# Patient Record
Sex: Female | Born: 1985 | Race: White | Hispanic: No | Marital: Single | Smoking: Former smoker
Health system: Southern US, Community
[De-identification: ages and names within clinical notes are randomized; demographics above are authoritative.]

## PROBLEM LIST (undated history)

## (undated) DIAGNOSIS — F1910 Other psychoactive substance abuse, uncomplicated: Secondary | ICD-10-CM

## (undated) DIAGNOSIS — Z87442 Personal history of urinary calculi: Secondary | ICD-10-CM

## (undated) DIAGNOSIS — G4730 Sleep apnea, unspecified: Secondary | ICD-10-CM

## (undated) DIAGNOSIS — R87619 Unspecified abnormal cytological findings in specimens from cervix uteri: Secondary | ICD-10-CM

## (undated) DIAGNOSIS — G43909 Migraine, unspecified, not intractable, without status migrainosus: Secondary | ICD-10-CM

## (undated) DIAGNOSIS — M1990 Unspecified osteoarthritis, unspecified site: Secondary | ICD-10-CM

## (undated) DIAGNOSIS — K219 Gastro-esophageal reflux disease without esophagitis: Secondary | ICD-10-CM

## (undated) DIAGNOSIS — N83209 Unspecified ovarian cyst, unspecified side: Secondary | ICD-10-CM

## (undated) DIAGNOSIS — E282 Polycystic ovarian syndrome: Secondary | ICD-10-CM

## (undated) DIAGNOSIS — N200 Calculus of kidney: Secondary | ICD-10-CM

## (undated) DIAGNOSIS — F419 Anxiety disorder, unspecified: Secondary | ICD-10-CM

## (undated) DIAGNOSIS — A389 Scarlet fever, uncomplicated: Secondary | ICD-10-CM

## (undated) DIAGNOSIS — H269 Unspecified cataract: Secondary | ICD-10-CM

## (undated) DIAGNOSIS — F32A Depression, unspecified: Secondary | ICD-10-CM

## (undated) DIAGNOSIS — F4310 Post-traumatic stress disorder, unspecified: Secondary | ICD-10-CM

## (undated) HISTORY — DX: Sleep apnea, unspecified: G47.30

## (undated) HISTORY — DX: Scarlet fever, uncomplicated: A38.9

## (undated) HISTORY — DX: Unspecified cataract: H26.9

## (undated) HISTORY — DX: Other psychoactive substance abuse, uncomplicated: F19.10

## (undated) HISTORY — DX: Unspecified abnormal cytological findings in specimens from cervix uteri: R87.619

## (undated) HISTORY — PX: TONSILLECTOMY: SUR1361

## (undated) HISTORY — PX: LUMBAR LAMINECTOMY: SHX95

## (undated) HISTORY — DX: Gastro-esophageal reflux disease without esophagitis: K21.9

## (undated) HISTORY — DX: Unspecified ovarian cyst, unspecified side: N83.209

## (undated) HISTORY — PX: OTHER SURGICAL HISTORY: SHX169

---
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
## 2005-08-08 ENCOUNTER — Emergency Department (HOSPITAL_COMMUNITY): Admission: EM | Admit: 2005-08-08 | Discharge: 2005-08-09 | Payer: Self-pay | Admitting: Emergency Medicine

## 2005-08-09 ENCOUNTER — Emergency Department (HOSPITAL_COMMUNITY): Admission: EM | Admit: 2005-08-09 | Discharge: 2005-08-09 | Payer: Self-pay | Admitting: Emergency Medicine

## 2005-08-15 ENCOUNTER — Emergency Department (HOSPITAL_COMMUNITY): Admission: EM | Admit: 2005-08-15 | Discharge: 2005-08-16 | Payer: Self-pay | Admitting: Emergency Medicine

## 2012-07-22 DIAGNOSIS — M545 Low back pain, unspecified: Secondary | ICD-10-CM

## 2012-07-22 HISTORY — DX: Low back pain, unspecified: M54.50

## 2012-12-24 DIAGNOSIS — F419 Anxiety disorder, unspecified: Secondary | ICD-10-CM | POA: Insufficient documentation

## 2012-12-24 DIAGNOSIS — J449 Chronic obstructive pulmonary disease, unspecified: Secondary | ICD-10-CM | POA: Insufficient documentation

## 2012-12-29 DIAGNOSIS — F603 Borderline personality disorder: Secondary | ICD-10-CM | POA: Insufficient documentation

## 2015-04-20 DIAGNOSIS — M4310 Spondylolisthesis, site unspecified: Secondary | ICD-10-CM | POA: Insufficient documentation

## 2016-06-04 DIAGNOSIS — R102 Pelvic and perineal pain: Secondary | ICD-10-CM | POA: Insufficient documentation

## 2016-06-04 HISTORY — DX: Pelvic and perineal pain: R10.2

## 2016-07-05 DIAGNOSIS — E282 Polycystic ovarian syndrome: Secondary | ICD-10-CM | POA: Insufficient documentation

## 2018-02-27 DIAGNOSIS — F332 Major depressive disorder, recurrent severe without psychotic features: Secondary | ICD-10-CM | POA: Insufficient documentation

## 2018-03-09 DIAGNOSIS — F39 Unspecified mood [affective] disorder: Secondary | ICD-10-CM | POA: Insufficient documentation

## 2018-03-09 DIAGNOSIS — F32A Depression, unspecified: Secondary | ICD-10-CM | POA: Insufficient documentation

## 2018-03-09 DIAGNOSIS — F4310 Post-traumatic stress disorder, unspecified: Secondary | ICD-10-CM | POA: Insufficient documentation

## 2018-03-09 HISTORY — DX: Depression, unspecified: F32.A

## 2018-03-10 DIAGNOSIS — N3000 Acute cystitis without hematuria: Secondary | ICD-10-CM | POA: Insufficient documentation

## 2018-03-10 DIAGNOSIS — K219 Gastro-esophageal reflux disease without esophagitis: Secondary | ICD-10-CM | POA: Insufficient documentation

## 2018-03-10 HISTORY — DX: Acute cystitis without hematuria: N30.00

## 2018-03-28 DIAGNOSIS — R079 Chest pain, unspecified: Secondary | ICD-10-CM

## 2018-03-28 DIAGNOSIS — G4733 Obstructive sleep apnea (adult) (pediatric): Secondary | ICD-10-CM | POA: Insufficient documentation

## 2018-03-28 DIAGNOSIS — E669 Obesity, unspecified: Secondary | ICD-10-CM | POA: Insufficient documentation

## 2018-03-28 HISTORY — DX: Chest pain, unspecified: R07.9

## 2018-04-12 DIAGNOSIS — R45851 Suicidal ideations: Secondary | ICD-10-CM | POA: Insufficient documentation

## 2018-04-12 HISTORY — DX: Suicidal ideations: R45.851

## 2018-06-12 DIAGNOSIS — B370 Candidal stomatitis: Secondary | ICD-10-CM | POA: Insufficient documentation

## 2018-06-12 DIAGNOSIS — A0472 Enterocolitis due to Clostridium difficile, not specified as recurrent: Secondary | ICD-10-CM

## 2018-06-12 HISTORY — DX: Candidal stomatitis: B37.0

## 2018-06-12 HISTORY — DX: Enterocolitis due to Clostridium difficile, not specified as recurrent: A04.72

## 2018-06-13 DIAGNOSIS — R197 Diarrhea, unspecified: Secondary | ICD-10-CM | POA: Insufficient documentation

## 2018-06-13 DIAGNOSIS — R7401 Elevation of levels of liver transaminase levels: Secondary | ICD-10-CM | POA: Insufficient documentation

## 2018-06-13 HISTORY — DX: Diarrhea, unspecified: R19.7

## 2018-06-13 HISTORY — DX: Hypomagnesemia: E83.42

## 2019-12-25 DIAGNOSIS — R4588 Nonsuicidal self-harm: Secondary | ICD-10-CM

## 2019-12-25 HISTORY — DX: Nonsuicidal self-harm: R45.88

## 2020-01-23 DIAGNOSIS — Z7289 Other problems related to lifestyle: Secondary | ICD-10-CM | POA: Insufficient documentation

## 2020-02-09 ENCOUNTER — Other Ambulatory Visit: Payer: Self-pay

## 2020-02-09 ENCOUNTER — Encounter (HOSPITAL_COMMUNITY): Payer: Self-pay | Admitting: *Deleted

## 2020-02-09 DIAGNOSIS — Z87891 Personal history of nicotine dependence: Secondary | ICD-10-CM | POA: Insufficient documentation

## 2020-02-09 DIAGNOSIS — N83202 Unspecified ovarian cyst, left side: Secondary | ICD-10-CM | POA: Diagnosis not present

## 2020-02-09 DIAGNOSIS — R1032 Left lower quadrant pain: Secondary | ICD-10-CM | POA: Insufficient documentation

## 2020-02-09 DIAGNOSIS — R109 Unspecified abdominal pain: Secondary | ICD-10-CM | POA: Diagnosis present

## 2020-02-09 LAB — URINALYSIS, ROUTINE W REFLEX MICROSCOPIC
Bilirubin Urine: NEGATIVE
Glucose, UA: NEGATIVE mg/dL
Ketones, ur: NEGATIVE mg/dL
Nitrite: NEGATIVE
Protein, ur: NEGATIVE mg/dL
RBC / HPF: >50 RBC/hpf — ABNORMAL HIGH (ref 0–5)
Specific Gravity, Urine: 1.023 (ref 1.005–1.030)
pH: 5 (ref 5.0–8.0)

## 2020-02-09 LAB — COMPREHENSIVE METABOLIC PANEL
ALT: 22 U/L (ref 0–44)
AST: 20 U/L (ref 15–41)
Albumin: 4.1 g/dL (ref 3.5–5.0)
Alkaline Phosphatase: 74 U/L (ref 38–126)
Anion gap: 9 (ref 5–15)
BUN: 23 mg/dL — ABNORMAL HIGH (ref 6–20)
CO2: 25 mmol/L (ref 22–32)
Calcium: 8.9 mg/dL (ref 8.9–10.3)
Chloride: 106 mmol/L (ref 98–111)
Creatinine, Ser: 0.93 mg/dL (ref 0.44–1.00)
GFR calc Af Amer: >60 mL/min (ref >60)
GFR calc non Af Amer: >60 mL/min (ref >60)
Glucose, Bld: 101 mg/dL — ABNORMAL HIGH (ref 70–99)
Potassium: 3.6 mmol/L (ref 3.5–5.1)
Sodium: 140 mmol/L (ref 135–145)
Total Bilirubin: 0.2 mg/dL — ABNORMAL LOW (ref 0.3–1.2)
Total Protein: 7.1 g/dL (ref 6.5–8.1)

## 2020-02-09 LAB — CBC
HCT: 42.3 % (ref 36.0–46.0)
Hemoglobin: 14.1 g/dL (ref 12.0–15.0)
MCH: 31.6 pg (ref 26.0–34.0)
MCHC: 33.3 g/dL (ref 30.0–36.0)
MCV: 94.8 fL (ref 80.0–100.0)
Platelets: 247 10*3/uL (ref 150–400)
RBC: 4.46 MIL/uL (ref 3.87–5.11)
RDW: 12 % (ref 11.5–15.5)
WBC: 10.6 10*3/uL — ABNORMAL HIGH (ref 4.0–10.5)
nRBC: 0 % (ref 0.0–0.2)

## 2020-02-09 LAB — I-STAT BETA HCG BLOOD, ED (MC, WL, AP ONLY): I-stat hCG, quantitative: <5 m[IU]/mL (ref <5)

## 2020-02-09 LAB — LIPASE, BLOOD: Lipase: 63 U/L — ABNORMAL HIGH (ref 11–51)

## 2020-02-09 MED ORDER — SODIUM CHLORIDE 0.9% FLUSH: 3.0000 mL | Freq: Once | INTRAVENOUS | Status: DC

## 2020-02-09 NOTE — ED Triage Notes (Signed)
Pt arrives via EMS, Left lower abdominal pain, for 8 hours, associated with nausea. En route 136 palpated, hr 80, 16 rr, 98 cbg . 98% RA

## 2020-02-09 NOTE — ED Triage Notes (Signed)
Pt reporting left lower abdominal pain "pressure and pain", pain better after urinating temporarily. No burning with urination, currently on her period. Nausea, now resolved. No vomiting, fevers,  or diarrhea.

## 2020-02-10 ENCOUNTER — Emergency Department (HOSPITAL_COMMUNITY): Payer: Medicare Other

## 2020-02-10 ENCOUNTER — Emergency Department (HOSPITAL_COMMUNITY)
Admission: EM | Admit: 2020-02-10 | Discharge: 2020-02-10 | Disposition: A | Discharge status: Home / Self Care | Payer: Medicare Other | Attending: Emergency Medicine | Admitting: Emergency Medicine

## 2020-02-10 ENCOUNTER — Telehealth: Payer: Self-pay | Admitting: Obstetrics & Gynecology

## 2020-02-10 DIAGNOSIS — N83202 Unspecified ovarian cyst, left side: Secondary | ICD-10-CM

## 2020-02-10 HISTORY — DX: Migraine, unspecified, not intractable, without status migrainosus: G43.909

## 2020-02-10 HISTORY — DX: Unspecified osteoarthritis, unspecified site: M19.90

## 2020-02-10 HISTORY — DX: Polycystic ovarian syndrome: E28.2

## 2020-02-10 LAB — CBG MONITORING, ED: Glucose-Capillary: 76 mg/dL (ref 70–99)

## 2020-02-10 MED ORDER — ACETAMINOPHEN 325 MG PO TABS
650.0000 mg | ORAL_TABLET | Freq: Once | ORAL | Status: AC
  Administered 2020-02-10: 07:00:00 650 mg via ORAL
  Filled 2020-02-10: qty 2

## 2020-02-10 NOTE — Discharge Instructions (Signed)
You have ovarian cyst. We think that is the cause.  Please see the Gynecologist as requested. Return to the ER if the symptoms get worse.

## 2020-02-10 NOTE — ED Provider Notes (Signed)
  Physical Exam  BP (!) 160/92   Pulse (!) 104   Temp 97.8 F (36.6 C)   Resp 18   Ht 5\' 5"  (1.651 m)   Wt 121.6 kg   SpO2 100%   BMI 44.60 kg/m   Physical Exam  ED Course/Procedures     Procedures  MDM   Assuming care of patient from Dr. .   Patient in the ED for abdominal pain. Workup thus far shows no peritoneal findings.  No red flags suggesting PID, TOA.  Concerning findings are as following : None Important pending results are : Ultrasound pelvis.  According to Dr. Bebe Shaggy, plan is to follow-up on the ultrasound.  Patient had no complains, no concerns from the nursing side. Will continue to monitor.  9:19 AM Ultrasound reveals that patient is a 5 cm ovarian cyst.  On repeat assessment patient is complaining of 2 out of 10 pain at most.  Her pain is not intense like it was earlier.  She has had ovarian cyst before, but has not required any surgical intervention.  Results of the ED work-up discussed with her.  She is new to town and does not have a gynecologist.  Follow-up appointment information provided.  Strict ER return precautions discussed.  At this time patient is not having complications from her cyst and she is stable for discharge.        Bebe Shaggy, MD 02/10/20 [phone]

## 2020-02-10 NOTE — ED Notes (Signed)
Discharge paperwork reviewed with pt.  Pt with no questions or concerns at this time, ambulatory at time of discharge.  

## 2020-02-10 NOTE — Telephone Encounter (Signed)
Patient was seen in the [facility] ED for pain. She would like to know what she can take for pain until her appointment.

## 2020-02-10 NOTE — ED Provider Notes (Signed)
[facility]-EMERGENCY DEPT Provider Note   CSN: [id number] Arrival date & time: 02/09/20  2230     History Chief Complaint  Patient presents with  . Abdominal Pain    Rebekah Davis is a 34 y.o. female.  The history is provided by the patient.  Abdominal Pain Pain location:  LLQ Pain quality: pressure   Pain radiates to:  Does not radiate Pain severity:  Moderate Onset quality:  Sudden Timing:  Constant Progression:  Improving Chronicity:  New Relieved by:  None tried Worsened by:  Nothing Associated symptoms: nausea   Associated symptoms: no constipation, no diarrhea, no dysuria, no fever, no hematemesis, no hematochezia, no vaginal discharge and no vomiting   Risk factors: has not had multiple surgeries   Patient reports onset left lower quadrant abdominal pain.  She reports a feeling of pressure, that was worsened with urination.  No back pain.  No fevers or vomiting.  She does report previous history of kidney stones, never had any urologic intervention.  Reports she is on her menstrual cycle but is improving.  Also reports history of ovarian cyst`     Past Medical History:  Diagnosis Date  . Arthritis   . Kidney stones   . Migraine   . PCOS (polycystic ovarian syndrome)        OB History   No obstetric history on file.     No family history on file.  Social History   Tobacco Use  . Smoking status: Former Smoker  Substance Use Topics  . Alcohol use: Not Currently  . Drug use: Not Currently    Home Medications Prior to Admission medications   Not on File    Allergies    Patient has no allergy information on record.  Review of Systems   Review of Systems  Constitutional: Negative for fever.  Gastrointestinal: Positive for abdominal pain and nausea. Negative for constipation, diarrhea, hematemesis, hematochezia and vomiting.  Genitourinary: Negative for dysuria and vaginal discharge.       Patient on menstrual cycle at this  time  All other systems reviewed and are negative.   Physical Exam Updated Vital Signs BP (!) 160/92   Pulse (!) 104   Temp 97.8 F (36.6 C)   Resp 18   Ht 1.651 m (5\' 5" )   Wt 121.6 kg   SpO2 100%   BMI 44.60 kg/m   Physical Exam CONSTITUTIONAL: Well developed/well nourished HEAD: Normocephalic/atraumatic EYES: EOMI/PERRL, no icterus ENMT: Mucous membranes moist NECK: supple no meningeal signs SPINE/BACK:entire spine nontender CV: S1/S2 noted, no murmurs/rubs/gallops noted LUNGS: Lungs are clear to auscultation bilaterally, no apparent distress ABDOMEN: soft, mild LLQ tenderness, no rebound or guarding, bowel sounds noted throughout abdomen GU:no cva tenderness NEURO: Pt is awake/alert/appropriate, moves all extremitiesx4.  No facial droop.  EXTREMITIES: pulses normal/equal, full ROM SKIN: warm, color normal PSYCH: no abnormalities of mood noted, alert and oriented to situation  ED Results / Procedures / Treatments   Labs (all labs ordered are listed, but only abnormal results are displayed) Labs Reviewed  LIPASE, BLOOD - Abnormal; Notable for the following components:      Result Value   Lipase 63 (*)    All other components within normal limits  COMPREHENSIVE METABOLIC PANEL - Abnormal; Notable for the following components:   Glucose, Bld 101 (*)    BUN 23 (*)    Total Bilirubin 0.2 (*)    All other components within normal limits  CBC - Abnormal; Notable  for the following components:   WBC 10.6 (*)    All other components within normal limits  URINALYSIS, ROUTINE W REFLEX MICROSCOPIC - Abnormal; Notable for the following components:   Hgb urine dipstick MODERATE (*)    Leukocytes,Ua SMALL (*)    RBC / HPF >50 (*)    Bacteria, UA RARE (*)    All other components within normal limits  URINE CULTURE  I-STAT BETA HCG BLOOD, ED ([facility], [facility], [facility] ONLY)  CBG MONITORING, ED    EKG None  Radiology No results found.  Procedures Procedures  Medications Ordered  in ED Medications  sodium chloride flush (NS) 0.9 % injection 3 mL (has no administration in time range)  acetaminophen (TYLENOL) tablet 650 mg (650 mg Oral Given 02/10/20 3662)    ED Course  I have reviewed the triage vital signs and the nursing notes.  Pertinent labs  results that were available during my care of the patient were reviewed by me and considered in my medical decision making (see chart for details).    MDM Rules/Calculators/A&P                     7:24 AM Presents for left lower abdominal pain.  She reports she has had kidney stones before multiple CT scans.  She declines CT imaging.  She is overall feeling improved.  Renal ultrasound has been ordered. Signed out to dr Kathrynn Humble at shift change to f/u on US imaging  Final Clinical Impression(s) / ED Diagnoses Final diagnoses:  None    Rx / DC Orders ED Discharge Orders    None       Ripley Fraise, MD 02/10/20 [phone]

## 2020-02-10 NOTE — Telephone Encounter (Signed)
Left message that I am returning your call in regards to your question and our office closing until Monday the only thing we can recommend is Tylenol or Ibuprofen because you would need to be evaluated.   If pain gets worse please go to Urgent Care.    Addison Naegeli, RN

## 2020-02-10 NOTE — ED Notes (Signed)
Pt provided [place] sandwich and apple juice, per her request.  EDP aware.

## 2020-02-11 LAB — URINE CULTURE

## 2020-02-19 DIAGNOSIS — N83209 Unspecified ovarian cyst, unspecified side: Secondary | ICD-10-CM | POA: Insufficient documentation

## 2020-03-11 ENCOUNTER — Ambulatory Visit (INDEPENDENT_AMBULATORY_CARE_PROVIDER_SITE_OTHER): Payer: Medicare Other | Admitting: Nurse Practitioner

## 2020-03-11 ENCOUNTER — Other Ambulatory Visit: Payer: Self-pay

## 2020-03-11 ENCOUNTER — Encounter: Payer: Self-pay | Admitting: Nurse Practitioner

## 2020-03-11 VITALS — BP 130/64 | HR 92 | Temp 98.4°F | Ht 65.5 in | Wt 289.0 lb

## 2020-03-11 DIAGNOSIS — N83202 Unspecified ovarian cyst, left side: Secondary | ICD-10-CM

## 2020-03-11 DIAGNOSIS — F332 Major depressive disorder, recurrent severe without psychotic features: Secondary | ICD-10-CM | POA: Diagnosis not present

## 2020-03-11 DIAGNOSIS — M7662 Achilles tendinitis, left leg: Secondary | ICD-10-CM

## 2020-03-11 DIAGNOSIS — Z7689 Persons encountering health services in other specified circumstances: Secondary | ICD-10-CM

## 2020-03-11 DIAGNOSIS — M7660 Achilles tendinitis, unspecified leg: Secondary | ICD-10-CM | POA: Insufficient documentation

## 2020-03-11 DIAGNOSIS — Z0000 Encounter for general adult medical examination without abnormal findings: Secondary | ICD-10-CM

## 2020-03-11 DIAGNOSIS — E559 Vitamin D deficiency, unspecified: Secondary | ICD-10-CM

## 2020-03-11 LAB — POCT URINALYSIS DIP (CLINITEK)
Bilirubin, UA: NEGATIVE
Glucose, UA: NEGATIVE mg/dL
Ketones, POC UA: NEGATIVE mg/dL
Leukocytes, UA: NEGATIVE
Nitrite, UA: NEGATIVE
POC PROTEIN,UA: NEGATIVE
Spec Grav, UA: 1.025 (ref 1.010–1.025)
Urobilinogen, UA: 0.2 E.U./dL
pH, UA: 6.5 (ref 5.0–8.0)

## 2020-03-11 MED ORDER — ESCITALOPRAM OXALATE 10 MG PO TABS: 10.0000 mg | ORAL_TABLET | Freq: Every day | ORAL | 2 refills | Status: DC

## 2020-03-11 MED ORDER — ARIPIPRAZOLE 20 MG PO TABS: 20.0000 mg | ORAL_TABLET | Freq: Every day | ORAL | 2 refills | Status: DC

## 2020-03-11 MED ORDER — ESCITALOPRAM OXALATE 5 MG PO TABS: 5.0000 mg | ORAL_TABLET | Freq: Every day | ORAL | 2 refills | Status: DC

## 2020-03-11 MED ORDER — MELOXICAM 15 MG PO TABS: 15.0000 mg | ORAL_TABLET | Freq: Every day | ORAL | 2 refills | Status: DC

## 2020-03-11 NOTE — Progress Notes (Signed)
[facility] Patient [facility] [address] [place], [place]  [address] Phone:  [phone]   Fax:  [phone]   New Patient Office Visit  Subjective:  Patient ID: Rebekah Davis, female    DOB: June 28, 1986  Age: 34 y.o. MRN: [id number]  CC:  Chief Complaint  Patient presents with  . New Patient (Initial Visit)    Ovarian cyst    HPI Rebekah Davis presents to establish.  She is accompanied by a grandmother.  She  has a past medical history of Arthritis, Kidney stones, Migraine, and PCOS (polycystic ovarian syndrome).   She has a history of major depression and borderline personality disorder.  She has had some suicidal ideations in the past.  She is currently undergoing counseling however does not have a prescriber for her current antidepressant therapy.  She admits that her current treatment of Lexapro 15 mg along with the Abilify 20 mg is effective.  She denies current depressed mood.  Denies any suicidal or homicidal ideations.  She will need refills.  She is in rehab at the [facility].  She moved to [facility] approximately 6 weeks ago.  Prior to this she was rehab out [place]; [place].  She was going to do a dance and stepped in hole in October 2020. She reinjured her foot 2 weeks ago. She has burning and little pain. She admits that her pain is worse in the morning.  The pain is aggravated by standing for long periods it causes more pain and burning. She denies any numbness, tingling or weakness.  She has been seen by orthopedist.  Previous MRI indicated left Achilles tear.  She is SP MRI from [facility] on yesterday.  She will start PT in on week.  She has recently been evaluated by gynecology and was diagnosed with ovarian cyst.  She has a follow-up apt with a new gynecologist for further evaluation of ovarian cyst.  Past Medical History:  Diagnosis Date  . Arthritis   . Kidney stones   . Migraine   . PCOS (polycystic ovarian syndrome)     History reviewed. No pertinent  surgical history.  Family History  Problem Relation Age of Onset  . Hypertension Mother   . Diabetes Mother   . Kidney disease Mother   . Depression Mother   . Hypertension Father   . Diabetes Father     Social History   Socioeconomic History  . Marital status: Single    Spouse name: Not on file  . Number of children: Not on file  . Years of education: Not on file  . Highest education level: Not on file  Occupational History  . Not on file  Tobacco Use  . Smoking status: Former Games developer  . Smokeless tobacco: Never Used  Substance and Sexual Activity  . Alcohol use: Not Currently  . Drug use: Not Currently  . Sexual activity: Not on file  Other Topics Concern  . Not on file  Social History Narrative  . Not on file   Social Determinants of Health   Financial Resource Strain:   . Difficulty of Paying Living Expenses:   Food Insecurity:   . Worried About Programme researcher, broadcasting/film/video in the Last Year:   . Barista in the Last Year:   Transportation Needs:   . Freight forwarder (Medical):   Marland Kitchen Lack of Transportation (Non-Medical):   Physical Activity:   . Days of Exercise per Week:   . Minutes of Exercise  per Session:   Stress:   . Feeling of Stress :   Social Connections:   . Frequency of Communication with Friends and Family:   . Frequency of Social Gatherings with Friends and Family:   . Attends Religious Services:   . Active Member of Clubs or Organizations:   . Attends Banker Meetings:   Marland Kitchen Marital Status:   Intimate Partner Violence:   . Fear of Current or Ex-Partner:   . Emotionally Abused:   Marland Kitchen Physically Abused:   . Sexually Abused:     ROS Review of Systems  Objective:   Today's Vitals: BP 130/64   Pulse 92   Temp 98.4 F (36.9 C)   Ht 5' 5.5" (1.664 m)   Wt 289 lb (131.1 kg)   LMP 03/09/2020   SpO2 100%   BMI 47.36 kg/m   Physical Exam Constitutional:      General: She is not in acute distress.    Appearance: She is  obese. She is not ill-appearing, toxic-appearing or diaphoretic.  HENT:     Head: Normocephalic and atraumatic.     Nose: Nose normal.     Mouth/Throat:     Mouth: Mucous membranes are moist.     Pharynx: Oropharynx is clear.  Eyes:     Extraocular Movements: Extraocular movements intact.     Conjunctiva/sclera: Conjunctivae normal.     Pupils: Pupils are equal, round, and reactive to light.  Cardiovascular:     Rate and Rhythm: Normal rate and regular rhythm.     Pulses: Normal pulses.     Heart sounds: Normal heart sounds.  Pulmonary:     Effort: Pulmonary effort is normal.     Breath sounds: Normal breath sounds.  Abdominal:     General: Bowel sounds are normal.     Palpations: Abdomen is soft.     Tenderness: There is no abdominal tenderness.     Comments: Left lower quadrant  Musculoskeletal:        General: No swelling or tenderness.     Cervical back: Normal range of motion and neck supple.     Left lower leg: No edema.     Comments: Crutches and use Adequate range of motion Minimal guarding to left lower extremity  Skin:    General: Skin is warm and dry.  Neurological:     General: No focal deficit present.     Mental Status: She is alert and oriented to person, place, and time.  Psychiatric:        Mood and Affect: Mood normal.        Behavior: Behavior normal.        Thought Content: Thought content normal.        Judgment: Judgment normal.     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 Metabolic Panel (12) (Completed)   TSH (Completed)   Magnesium  (Completed)   Sedimentation rate (Completed)   Amylase (Completed)   Lipase (Completed)  POCT URINALYSIS DIP (CLINITEK) (Completed)   Vitamin D insufficiency       Labs pending   Relevant Orders   VITAMIN D 25 Hydroxy (Vit-D Deficiency, Fractures) (Completed)   Encounter to establish care          Outpatient Encounter Medications as of 03/11/2020  Medication Sig  . acetaminophen (TYLENOL) 325 MG tablet Take 650 mg by mouth daily as needed for pain.  . ARIPiprazole (ABILIFY) 20 MG tablet Take 1 tablet (20 mg total) by mouth daily.  Marland Kitchen escitalopram (LEXAPRO) 10 MG tablet Take 1 tablet (10 mg total) by mouth daily.  Marland Kitchen escitalopram (LEXAPRO) 5 MG tablet Take 1 tablet (5 mg total) by mouth daily.  . meloxicam (MOBIC) 15 MG tablet Take 1 tablet (15 mg total) by mouth daily.  . [DISCONTINUED] ARIPiprazole (ABILIFY) 20 MG tablet Take 20 mg by mouth daily.  . [DISCONTINUED] escitalopram (LEXAPRO) 10 MG tablet Take 10 mg by mouth daily.  . [DISCONTINUED] escitalopram (LEXAPRO) 5 MG tablet Take 5 mg by mouth daily.  . [DISCONTINUED] meloxicam (MOBIC) 15 MG tablet Take 15 mg by mouth daily.   No facility-administered encounter medications on file as of 03/11/2020.    Follow-up: Return in about 3 months (around 06/11/2020).   Vevelyn Francois, NP

## 2020-03-11 NOTE — Patient Instructions (Signed)
Ovarian Cyst An ovarian cyst is a fluid-filled sac on an ovary. The ovaries are organs that make eggs in women. Most ovarian cysts go away on their own and are not cancerous (are benign). Some cysts need treatment. Follow these instructions at home:  Take over-the-counter and prescription medicines only as told by your doctor.  Do not drive or use heavy machinery while taking prescription pain medicine.  Get pelvic exams and Pap tests as often as told by your doctor.  Return to your normal activities as told by your doctor. Ask your doctor what activities are safe for you.  Do not use any products that contain nicotine or tobacco, such as cigarettes and e-cigarettes. If you need help quitting, ask your doctor.  Keep all follow-up visits as told by your doctor. This is important. Contact a doctor if:  Your periods are: ? Late. ? Irregular. ? Painful.   Your periods stop.  You have pelvic pain that does not go away.  You have pressure on your bladder.  You have trouble making your bladder empty when you pee (urinate).  You have pain during sex.  You have any of the following in your belly (abdomen): ? A feeling of fullness. ? Pressure. ? Discomfort. ? Pain that does not go away. ? Swelling.  You feel sick most of the time.  You have trouble pooping (have constipation).  You are not as hungry as usual (you lose your appetite).  You get very bad acne.  You start to have more hair on your body and face.  You are gaining weight or losing weight without changing your exercise and eating habits.  You think you may be pregnant. Get help right away if:  You have belly pain that is very bad or gets worse.  You cannot eat or drink without throwing up (vomiting).  You suddenly get a fever.  Your period is a lot heavier than usual. This information is not intended to replace advice given to you by your health care provider. Make sure you discuss any questions you have  with your health care provider. Document Revised: 09/13/2017 Document Reviewed: 03/04/2016 Elsevier Patient Education  [employer]. Preventing Unhealthy [employer], Adult Staying at a healthy weight is important to your overall health. When fat builds up in your body, you may become overweight or obese. Being overweight or obese increases your risk of developing certain health problems, such as heart disease, diabetes, sleeping problems, joint problems, and some types of cancer. Unhealthy weight gain is often the result of making unhealthy food choices or not getting enough exercise. You can make changes to your lifestyle to prevent obesity and stay as healthy as possible. What nutrition changes can be made?   Eat only as much as your body needs. To do this: ? Pay attention to signs that you are hungry or full. Stop eating as soon as you feel full. ? If you feel hungry, try drinking water first before eating. Drink enough water so your urine is clear or pale yellow. ? Eat smaller portions. Pay attention to portion sizes when eating out. ? Look at serving sizes on food labels. Most foods contain more than one serving per container. ? Eat the recommended number of calories for your gender and activity level. For most active people, a daily total of 2,000 calories is appropriate. If you are trying to lose weight or are not very active, you may need to eat fewer calories. Talk with your health  care provider or a diet and nutrition specialist (dietitian) about how many calories you need each day.  Choose healthy foods, such as: ? Fruits and vegetables. At each meal, try to fill at least half of your plate with fruits and vegetables. ? Whole grains, such as whole-wheat bread, brown rice, and quinoa. ? Lean meats, such as chicken or fish. ? Other healthy proteins, such as beans, eggs, or tofu. ? Healthy fats, such as nuts, seeds, fatty fish, and olive oil. ? Low-fat or fat-free dairy  products.  Check food labels, and avoid food and drinks that: ? Are high in calories. ? Have added sugar. ? Are high in sodium. ? Have saturated fats or trans fats.  Cook foods in healthier ways, such as by baking, broiling, or grilling.  Make a meal plan for the week, and shop with a grocery list to help you stay on track with your purchases. Try to avoid going to the grocery store when you are hungry.  When grocery shopping, try to shop around the outside of the store first, where the fresh foods are. Doing this helps you to avoid prepackaged foods, which can be high in sugar, salt (sodium), and fat. What lifestyle changes can be made?   Exercise for 30 or more minutes on 5 or more days each week. Exercising may include brisk walking, yard work, biking, running, swimming, and team sports like basketball and soccer. Ask your health care provider which exercises are safe for you.  Do muscle-strengthening activities, such as lifting weights or using resistance bands, on 2 or more days a week.  Do not use any products that contain nicotine or tobacco, such as cigarettes and e-cigarettes. If you need help quitting, ask your health care provider.  Limit alcohol intake to no more than 1 drink a day for nonpregnant women and 2 drinks a day for men. One drink equals 12 oz of beer, 5 oz of wine, or 1 oz of hard liquor.  Try to get 7-9 hours of sleep each night. What other changes can be made?  Keep a food and activity journal to keep track of: ? What you ate and how many calories you had. Remember to count the calories in sauces, dressings, and side dishes. ? Whether you were active, and what exercises you did. ? Your calorie, weight, and activity goals.  Check your weight regularly. Track any changes. If you notice you have gained weight, make changes to your diet or activity routine.  Avoid taking weight-loss medicines or supplements. Talk to your health care provider before starting any  new medicine or supplement.  Talk to your health care provider before trying any new diet or exercise plan. Why are these changes important? Eating healthy, staying active, and having healthy habits can help you to prevent obesity. Those changes also:  Help you manage stress and emotions.  Help you connect with friends and family.  Improve your self-esteem.  Improve your sleep.  Prevent long-term health problems. What can happen if changes are not made? Being obese or overweight can cause you to develop joint or bone problems, which can make it hard for you to stay active or do activities you enjoy. Being obese or overweight also puts stress on your heart and lungs and can lead to health problems like diabetes, heart disease, and some cancers. Where to find more information Talk with your health care provider or a dietitian about healthy eating and healthy lifestyle choices. You may also find  information from:  U.S. Department of Agriculture, MyPlate: https://ball-collins.biz/  American Heart Association: www.heart.org  Centers for Disease Control and Prevention: FootballExhibition.com.br Summary  Staying at a healthy weight is important to your overall health. It helps you to prevent certain diseases and health problems, such as heart disease, diabetes, joint problems, sleep disorders, and some types of cancer.  Being obese or overweight can cause you to develop joint or bone problems, which can make it hard for you to stay active or do activities you enjoy.  You can prevent unhealthy weight gain by eating a healthy diet, exercising regularly, not smoking, limiting alcohol, and getting enough sleep.  Talk with your health care provider or a dietitian for guidance about healthy eating and healthy lifestyle choices. This information is not intended to replace advice given to you by your health care provider. Make sure you discuss any questions you have with your health care provider. Document  Revised: 10/04/2017 Document Reviewed: 11/07/2016 Elsevier Patient Education  2020 Elsevier Inc. Achilles Tendinitis  Achilles tendinitis is inflammation of the tough, cord-like band that attaches the lower leg muscles to the heel bone (Achilles tendon). This is usually caused by overusing the tendon and the ankle joint. Achilles tendinitis usually gets better over time with treatment and caring for yourself at home. It can take weeks or months to heal completely. What are the causes? This condition may be caused by:  A sudden increase in exercise or activity, such as running.  Doing the same exercises or activities, such as jumping, over and over.  Not warming up calf muscles before exercising.  Exercising in shoes that are worn out or not made for exercise.  Having arthritis or a bone growth (spur) on the back of the heel bone. This can rub against the tendon and hurt it.  Age-related wear and tear. Tendons become less flexible with age and are more likely to be injured. What are the signs or symptoms? Common symptoms of this condition include:  Pain in the Achilles tendon or in the back of the leg, just above the heel. The pain usually gets worse with exercise.  Stiffness or soreness in the back of the leg, especially in the morning.  Swelling of the skin over the Achilles tendon.  Thickening of the tendon.  Trouble standing on tiptoe. How is this diagnosed? This condition is diagnosed based on your symptoms and a physical exam. You may have tests, including:  X-rays.  MRI. How is this treated? The goal of treatment is to relieve symptoms and help your injury heal. Treatment may include:  Decreasing or stopping activities that caused the tendinitis. This may mean switching to low-impact exercises like biking or swimming.  Icing the injured area.  Doing physical therapy, including strengthening and stretching exercises.  Taking NSAIDs, such as ibuprofen, to help  relieve pain and swelling.  Using supportive shoes, wraps, heel lifts, or a walking boot (air cast).  Having surgery. This may be done if your symptoms do not improve after other treatments.  Using high-energy shock wave impulses to stimulate the healing process (extracorporeal shock wave therapy). This is rare.  Having an injection of medicines that help relieve inflammation (corticosteroids). This is rare. Follow these instructions at home: If you have an air cast:  Wear the air cast as told by your health care provider. Remove it only as told by your health care provider.  Loosen it if your toes tingle, become numb, or turn cold and blue.  Keep  it clean.  If the air cast is not waterproof: ? Do not let it get wet. ? Cover it with a watertight covering when you take a bath or shower. Managing pain, stiffness, and swelling   If directed, put ice on the injured area. To do this: ? If you have a removable air cast, remove it as told by your health care provider. ? Put ice in a plastic bag. ? Place a towel between your skin and the bag. ? Leave the ice on for 20 minutes, 2-3 times a day.  Move your toes often to reduce stiffness and swelling.  Raise (elevate) your foot above the level of your heart while you are sitting or lying down. Activity  Gradually return to your normal activities as told by your health care provider. Ask your health care provider what activities are safe for you.  Do not do activities that cause pain.  Consider doing low-impact exercises, like cycling or swimming.  Ask your health care provider when it is safe to drive if you have an air cast on your foot.  If physical therapy was prescribed, do exercises as told by your health care provider or physical therapist. General instructions  If directed, wrap your foot with an elastic bandage or other wrap. This can help to keep your tendon from moving too much while it heals. Your health care provider  will show you how to wrap your foot correctly.  Wear supportive shoes or heel lifts only as told by your health care provider.  Take over-the-counter and prescription medicines only as told by your health care provider.  Keep all follow-up visits as told by your health care provider. This is important. Contact a health care provider if you:  Have symptoms that get worse.  Have pain that does not get better with medicine.  Develop new, unexplained symptoms.  Develop warmth and swelling in your foot.  Have a fever. Get help right away if you:  Have a sudden popping sound or sensation in your Achilles tendon followed by severe pain.  Cannot move your toes or foot.  Cannot put any weight on your foot.  Your foot or toes become numb and look white or blue even after loosening your bandage or air cast. Summary  Achilles tendinitis is inflammation of the tough, cord-like band that attaches the lower leg muscles to the heel bone (Achilles tendon).  This condition is usually caused by overusing the tendon and the ankle joint. It can also be caused by arthritis or normal aging.  The most common symptoms of this condition include pain, swelling, or stiffness in the Achilles tendon or in the back of the leg.  This condition is usually treated by decreasing or stopping activities that caused the tendinitis, icing the injured area, taking NSAIDs, and doing physical therapy. This information is not intended to replace advice given to you by your health care provider. Make sure you discuss any questions you have with your health care provider. Document Revised: 02/16/2019 Document Reviewed: 02/16/2019 Elsevier Patient Education  2020 [employer].

## 2020-03-12 LAB — COMP. METABOLIC PANEL (12)
AST: 24 IU/L (ref 0–40)
Albumin/Globulin Ratio: 2 (ref 1.2–2.2)
Albumin: 4.3 g/dL (ref 3.8–4.8)
Alkaline Phosphatase: 77 IU/L (ref 48–121)
BUN/Creatinine Ratio: 20 (ref 9–23)
BUN: 18 mg/dL (ref 6–20)
Bilirubin Total: 0.3 mg/dL (ref 0.0–1.2)
Calcium: 9.3 mg/dL (ref 8.7–10.2)
Chloride: 104 mmol/L (ref 96–106)
Creatinine, Ser: 0.89 mg/dL (ref 0.57–1.00)
GFR calc Af Amer: 98 mL/min/{1.73_m2} (ref >59)
GFR calc non Af Amer: 85 mL/min/{1.73_m2} (ref >59)
Globulin, Total: 2.1 g/dL (ref 1.5–4.5)
Glucose: 99 mg/dL (ref 65–99)
Potassium: 3.7 mmol/L (ref 3.5–5.2)
Sodium: 142 mmol/L (ref 134–144)
Total Protein: 6.4 g/dL (ref 6.0–8.5)

## 2020-03-12 LAB — LIPASE: Lipase: 23 U/L (ref 14–72)

## 2020-03-12 LAB — TSH: TSH: 1.38 u[IU]/mL (ref 0.450–4.500)

## 2020-03-12 LAB — AMYLASE: Amylase: 47 U/L (ref 31–110)

## 2020-03-12 LAB — VITAMIN D 25 HYDROXY (VIT D DEFICIENCY, FRACTURES): Vit D, 25-Hydroxy: 21.6 ng/mL — ABNORMAL LOW (ref 30.0–100.0)

## 2020-03-12 LAB — SEDIMENTATION RATE: Sed Rate: 7 mm/hr (ref 0–32)

## 2020-03-12 LAB — MAGNESIUM: Magnesium: 1.9 mg/dL (ref 1.6–2.3)

## 2020-03-16 ENCOUNTER — Telehealth: Payer: Self-pay | Admitting: Nurse Practitioner

## 2020-03-16 NOTE — Progress Notes (Signed)
Called  spoke with patient  about lab results, inform patient start vit d

## 2020-03-22 ENCOUNTER — Encounter: Payer: Medicare Other | Admitting: Obstetrics & Gynecology

## 2020-03-23 ENCOUNTER — Other Ambulatory Visit: Payer: Self-pay

## 2020-03-23 ENCOUNTER — Ambulatory Visit: Payer: Medicare Other | Attending: Student | Admitting: Physical Therapy

## 2020-03-23 ENCOUNTER — Encounter: Payer: Self-pay | Admitting: Physical Therapy

## 2020-03-23 DIAGNOSIS — M79672 Pain in left foot: Secondary | ICD-10-CM | POA: Diagnosis not present

## 2020-03-23 DIAGNOSIS — R262 Difficulty in walking, not elsewhere classified: Secondary | ICD-10-CM | POA: Insufficient documentation

## 2020-03-23 NOTE — Patient Instructions (Signed)
Access Code: [email or web address]: [email or web address]: 06/09/2021Prepared by: [facility]  Seated Ankle Circles - 3-4 x daily - 7 x weekly - 10 reps  Ankle Circles in Elevation - 3-4 x daily - 7 x weekly - 10 reps  Long Sitting Calf Stretch with Strap - 5 x daily - 7 x weekly - 1 reps - 10 seconds hold   [facility] [address] [place], [place] [address] Phone # [phone] Fax [phone]

## 2020-03-23 NOTE — Therapy (Signed)
[facility] [address] [place], [place], [address] Phone: [phone]   Fax:  [phone]  Physical Therapy Evaluation  Patient Details  Name: Rebekah Davis MRN: [id number] Date of Birth: 1986-04-30 Referring Provider (PT): Lowella Dell, MD   Encounter Date: 03/23/2020  PT End of Session - 03/23/20 1225    Visit Number  1    Date for PT Re-Evaluation  05/27/20    Authorization Type  Medicare    Authorization Time Period  03/23/20 to 05/27/20    Authorization - Visit Number  1    Authorization - Number of Visits  10    PT Start Time  1015    PT Stop Time  1104    PT Time Calculation (min)  49 min    Activity Tolerance  No increased pain;Patient tolerated treatment well    Behavior During Therapy  [facility] for tasks assessed/performed       Past Medical History:  Diagnosis Date  . Arthritis   . Kidney stones   . Migraine   . PCOS (polycystic ovarian syndrome)     History reviewed. No pertinent surgical history.  There were no vitals filed for this visit.   Subjective Assessment - 03/23/20 1016    Subjective  Pt states that she started having pain in the Lt heel in June of 2020. She was diagnosed with a tear in the Lt achilles in November but as of 2 weeks ago she got a MRI and was told her tear was healed with just tendinitis and bursitis.    Limitations  Walking;Standing    Currently in Pain?  Yes    Pain Score  5     Pain Location  Heel    Pain Orientation  Left;Posterior    Pain Descriptors / Indicators  Aching;Burning    Pain Type  Chronic pain    Pain Radiating Towards  none    Pain Onset  More than a month ago    Pain Frequency  Constant    Aggravating Factors   standing, walking    Pain Relieving Factors  resting, icing    Effect of Pain on Daily Activities  limited participation in activity with her program         [facility] PT Assessment - 03/23/20 0001      Assessment   Medical Diagnosis   Lt achilled tendonitis     Referring Provider (PT)  Lowella Dell, MD    Onset Date/Surgical Date  --   June 2020   Next MD Visit  following PT    Prior Therapy  Had OPPT 3 months but this worsened pain       Precautions   Precautions  None      Prior Function   Vocation Requirements  volunteers 4-5 hours a day, alot of stairs and standing       Cognition   Overall Cognitive Status  Within Functional Limits for tasks assessed      Observation/Other Assessments   Observations  swelling noted Lt lateral heel     Focus on Therapeutic Outcomes (FOTO)   next visit      ROM / Strength   AROM / PROM / Strength  AROM;Strength      AROM   AROM Assessment Site  Ankle    Right/Left Ankle  Left    Left Ankle Dorsiflexion  -10   knee flexed: to neutral    Left Ankle Plantar Flexion  30  Left Ankle Inversion  30    Left Ankle Eversion  5      Strength   Strength Assessment Site  Ankle    Right/Left Ankle  Left    Left Ankle Dorsiflexion  5/5   within range    Left Ankle Plantar Flexion  --   unable to complete standing heel raise   Left Ankle Inversion  4/5    Left Ankle Eversion  5/5      Palpation   Palpation comment  tender to palpation achilles insertion at Lt calcaneus      Ambulation/Gait   Gait Comments  minimal push off on Lt, decreased stance time on Lt: improved standing tolerance with addition of small heel lift       High Level Balance   High Level Balance Comments  unable to maintain single leg balance on Lt                   Objective measurements completed on examination: See above findings.      [facility] PT Treatment/Exercise - 03/23/20 0001      Exercises   Exercises  Ankle      Manual Therapy   Manual Therapy  Soft tissue mobilization    Soft tissue mobilization  STM Lt gastroc, trigger point release with passive ankle DF       Ankle Exercises: Stretches   Gastroc Stretch  2 reps;10 seconds    Gastroc Stretch Limitations   with towel, gentle      Ankle Exercises: Seated   Ankle Circles/Pumps  Left;10 reps             PT Education - 03/23/20 1054    Education Details  adjustments in gati/heel lift while volunteering; implemented HEP    Person(s) Educated  Patient    Methods  Explanation;Handout    Comprehension  Verbalized understanding;Returned demonstration       PT Short Term Goals - 03/23/20 1144      PT SHORT TERM GOAL #1   Title  Pt will be independent with her initial HEP to increase ankle flexibility and decrease pain.    Time  4    Period  Weeks    Status  New        PT Long Term Goals - 03/23/20 1207      PT LONG TERM GOAL #1   Title  Pt will have atleast 10 deg of passive Lt ankle dorsiflexion to improve foot clearance during ambulation.    Time  8    Period  Weeks    Status  New      PT LONG TERM GOAL #2   Title  Pt will be able to ambulate with adequate push off and step length x100 secondary to improved pain and gastroc strength.    Time  8    Period  Weeks    Status  New      PT LONG TERM GOAL #3   Title  Pt will be able to complete atleast 10 reps of single leg heel raise with or without pain to reflect improvements in gastroc strength.    Time  8    Period  Weeks    Status  New      PT LONG TERM GOAL #4   Title  Pt will be able to maintain single leg stance on the Lt for atleast 5 seconds, 2/3 trials, to reflect increase in ankle proprioception.    Time  8    Period  Weeks    Status  New      PT LONG TERM GOAL #5   Title  Pt will report atleast 60% improvement in heel pain and swelling from the start of PT.    Time  8    Period  Weeks    Status  New             Plan - 03/23/20 1227    Clinical Impression Statement  Pt is a pleasant 34 y.o F referred to OPPT with complaints of Lt heel pain since June 2020. She had a course of OPPT but this seemed to exacerbate her symptoms. She was diagnosed with Lt achilles tear which has reportedly healed, and  she now presents with diagnosis of achilles tendinitis and bursitis. Pt has significant tenderness and some swelling along the insertion of the achilles tendon. She has antalgic gait pattern, lacks 10 deg of passive Lt dorsiflexion to neutral and is unable to complete single leg stance on the Lt due to pain. Pt has palpable trigger points in the Lt gastroc and was educated on dry needling. She would like to hold off on this for now, so PT completed soft tissue mobilization to the gastroc. Ended with the game-ready to decrease pain and swelling. Pt would benefit from skilled PT to address her pain and limitations in ankle flexibility, strength and proprioception to increase her ability to participate in daily activity with her peers.    Personal Factors and Comorbidities  Time since onset of injury/illness/exacerbation;Fitness    Examination-Activity Limitations  Stairs;Stand    Manufacturing engineer;Yard Work    Stability/Clinical Decision Making  Stable/Uncomplicated    Clinical Decision Making  Moderate    Rehab Potential  Good    PT Frequency  2x / week    PT Duration  8 weeks    PT Treatment/Interventions  ADLs/Self Care Home Management;Aquatic Therapy;Cryotherapy;Iontophoresis 4mg /ml Dexamethasone;Therapeutic activities;Therapeutic exercise;Balance training;Neuromuscular re-education;Manual techniques;Stair training;Gait training;Dry needling;Passive range of motion;Taping;Patient/family education    PT Next Visit Plan  manual to gastroc; trial isometrics PF, strength-offloading achilles    PT Home Exercise Plan  Access Code: [email or web address]: [email or web address]: 06/09/2021Prepared by: [facility].Seated Ankle Circles - 3-4 x daily - 7 x weekly - 10 reps.Ankle Circles in Elevation - 3-4 x daily - 7 x weekly - 10 reps.Long Sitting Calf Stretch with Strap - 5 x daily - 7 x weekly - 1 reps - 10 seconds hold    Consulted and Agree  with Plan of Care  Patient       Patient will benefit from skilled therapeutic intervention in order to improve the following deficits and impairments:  Abnormal gait, Decreased balance  Visit Diagnosis: Pain in left foot  Difficulty in walking, not elsewhere classified     Problem List Patient Active Problem List   Diagnosis Date Noted  . Achilles tendinitis 03/11/2020  . Ovarian cyst 02/19/2020  . Deliberate self-cutting 01/23/2020  . Non-suicidal self harm as coping mechanism (HCC) 12/25/2019  . Diarrhea 06/13/2018  . Hypomagnesemia 06/13/2018  . Transaminitis 06/13/2018  . C. difficile diarrhea 06/12/2018  . Oral thrush 06/12/2018  . Suicidal ideation 04/12/2018  . Chest pain 03/28/2018  . Obesity 03/28/2018  . OSA (obstructive sleep apnea) 03/28/2018  . Acute cystitis 03/10/2018  . GERD (gastroesophageal reflux disease) 03/10/2018  . Depression with suicidal ideation 03/09/2018  . PTSD (post-traumatic stress disorder) 03/09/2018  . 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 Polycystic disease, ovaries 07/05/2016  . Abdominal pain, suprapubic 06/04/2016  . Spondylisthesis 04/20/2015  . Herniated lumbar intervertebral disc 02/28/2013  . Sciatica of left side 02/28/2013  . Borderline personality disorder (HCC) 12/29/2012  . Anxiety 12/24/2012  . COPD (chronic obstructive pulmonary disease) (HCC) 12/24/2012  . Essential hypertension 12/24/2012  . Low back pain 07/22/2012    1:13 PM,03/23/20 Donita Brooks PT, DPT [facility]  [phone]  [facility] [address] [place], [place], [address] Phone: [phone]   Fax:  [phone]  Name: Rebekah Davis MRN: [id number] Date of Birth: July 25, 1986

## 2020-03-24 ENCOUNTER — Encounter: Payer: Medicare Other | Admitting: Family Medicine

## 2020-03-24 ENCOUNTER — Other Ambulatory Visit: Payer: Self-pay

## 2020-03-24 ENCOUNTER — Emergency Department (HOSPITAL_COMMUNITY)
Admission: EM | Admit: 2020-03-24 | Discharge: 2020-03-25 | Disposition: A | Discharge status: Home / Self Care | Payer: Medicare Other | Attending: Emergency Medicine | Admitting: Emergency Medicine

## 2020-03-24 DIAGNOSIS — Z87891 Personal history of nicotine dependence: Secondary | ICD-10-CM | POA: Insufficient documentation

## 2020-03-24 DIAGNOSIS — R102 Pelvic and perineal pain: Secondary | ICD-10-CM | POA: Diagnosis not present

## 2020-03-24 DIAGNOSIS — R1030 Lower abdominal pain, unspecified: Secondary | ICD-10-CM | POA: Diagnosis present

## 2020-03-24 DIAGNOSIS — Z79899 Other long term (current) drug therapy: Secondary | ICD-10-CM | POA: Insufficient documentation

## 2020-03-24 NOTE — ED Triage Notes (Signed)
Pt has hx of a cyst on her left side and and pt said she has been having more cramping and pressure on that left side TODAY. No nausea, no vomiting.

## 2020-03-25 DIAGNOSIS — R102 Pelvic and perineal pain: Secondary | ICD-10-CM | POA: Diagnosis not present

## 2020-03-25 LAB — CBC
HCT: 45.6 % (ref 36.0–46.0)
Hemoglobin: 14.9 g/dL (ref 12.0–15.0)
MCH: 31.3 pg (ref 26.0–34.0)
MCHC: 32.7 g/dL (ref 30.0–36.0)
MCV: 95.8 fL (ref 80.0–100.0)
Platelets: 246 10*3/uL (ref 150–400)
RBC: 4.76 MIL/uL (ref 3.87–5.11)
RDW: 12.3 % (ref 11.5–15.5)
WBC: 10.5 10*3/uL (ref 4.0–10.5)
nRBC: 0 % (ref 0.0–0.2)

## 2020-03-25 LAB — BASIC METABOLIC PANEL
Anion gap: 10 (ref 5–15)
BUN: 17 mg/dL (ref 6–20)
CO2: 28 mmol/L (ref 22–32)
Calcium: 9.1 mg/dL (ref 8.9–10.3)
Chloride: 106 mmol/L (ref 98–111)
Creatinine, Ser: 0.95 mg/dL (ref 0.44–1.00)
GFR calc Af Amer: >60 mL/min (ref >60)
GFR calc non Af Amer: >60 mL/min (ref >60)
Glucose, Bld: 101 mg/dL — ABNORMAL HIGH (ref 70–99)
Potassium: 4.2 mmol/L (ref 3.5–5.1)
Sodium: 144 mmol/L (ref 135–145)

## 2020-03-25 LAB — WET PREP, GENITAL
Sperm: NONE SEEN
Trich, Wet Prep: NONE SEEN
Yeast Wet Prep HPF POC: NONE SEEN

## 2020-03-25 LAB — URINALYSIS, ROUTINE W REFLEX MICROSCOPIC
Bilirubin Urine: NEGATIVE
Glucose, UA: NEGATIVE mg/dL
Hgb urine dipstick: NEGATIVE
Ketones, ur: NEGATIVE mg/dL
Leukocytes,Ua: NEGATIVE
Nitrite: NEGATIVE
Protein, ur: NEGATIVE mg/dL
Specific Gravity, Urine: 1.025 (ref 1.005–1.030)
pH: 5 (ref 5.0–8.0)

## 2020-03-25 LAB — RPR: RPR Ser Ql: NONREACTIVE

## 2020-03-25 LAB — POC URINE PREG, ED: Preg Test, Ur: NEGATIVE

## 2020-03-25 LAB — HIV ANTIBODY (ROUTINE TESTING W REFLEX): HIV Screen 4th Generation wRfx: NONREACTIVE

## 2020-03-25 MED ORDER — MELOXICAM 15 MG PO TABS: 15.0000 mg | ORAL_TABLET | Freq: Every day | ORAL | 0 refills | Status: AC

## 2020-03-25 MED ORDER — DOXYCYCLINE HYCLATE 100 MG PO TABS
100.0000 mg | ORAL_TABLET | Freq: Two times a day (BID) | ORAL | 0 refills | Status: DC
Start: 2020-03-25 — End: 2020-06-09

## 2020-03-25 MED ORDER — ACETAMINOPHEN 325 MG PO TABS
650.0000 mg | ORAL_TABLET | Freq: Once | ORAL | Status: AC
  Administered 2020-03-25: 650 mg via ORAL
  Filled 2020-03-25: qty 2

## 2020-03-25 NOTE — Discharge Instructions (Addendum)
Take the medications as prescribed.  Follow-up with your OB/GYN doctor or primary care doctor to be rechecked

## 2020-03-25 NOTE — Telephone Encounter (Signed)
Sent to NP 

## 2020-03-25 NOTE — ED Provider Notes (Signed)
[facility] EMERGENCY DEPARTMENT Provider Note   CSN: [id number] Arrival date & time: 03/24/20  2247     History Chief Complaint  Patient presents with  . Abdominal Cramping    Rebekah Davis is a 34 y.o. female.  HPI   Pt presents with sharp and cramping L>R lower abd pain.  Sx started yest around 11 am.  The pain comes and goes.  NO nausea or vomiting.  Pt is hungry.  No diarrhea.  She sometimes feels some pressure to urinate.  Pt has had ovarian cysts in the past. She is in a recovery house and can only take non narcotic pain meds. Past Medical History:  Diagnosis Date  . Arthritis   . Kidney stones   . Migraine   . PCOS (polycystic ovarian syndrome)     Patient Active Problem List   Diagnosis Date Noted  . Achilles tendinitis 03/11/2020  . Ovarian cyst 02/19/2020  . Deliberate self-cutting 01/23/2020  . Non-suicidal self harm as coping mechanism (HCC) 12/25/2019  . Diarrhea 06/13/2018  . Hypomagnesemia 06/13/2018  . Transaminitis 06/13/2018  . C. difficile diarrhea 06/12/2018  . Oral thrush 06/12/2018  . Suicidal ideation 04/12/2018  . Chest pain 03/28/2018  . Obesity 03/28/2018  . OSA (obstructive sleep apnea) 03/28/2018  . Acute cystitis 03/10/2018  . GERD (gastroesophageal reflux disease) 03/10/2018  . Depression with suicidal ideation 03/09/2018  . PTSD (post-traumatic stress disorder) 03/09/2018  . 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 Polycystic disease, ovaries 07/05/2016  . Abdominal pain, suprapubic 06/04/2016  . Spondylisthesis 04/20/2015  . Herniated lumbar intervertebral disc 02/28/2013  . Sciatica of left side 02/28/2013  . Borderline personality disorder (HCC) 12/29/2012  . Anxiety 12/24/2012  . COPD (chronic obstructive pulmonary disease) (HCC) 12/24/2012  . Essential hypertension 12/24/2012  . Low back pain 07/22/2012    No past surgical history on file.   OB History    No obstetric history on file.     Family History  Problem Relation Age of Onset  . Hypertension Mother   . Diabetes Mother   . Kidney disease Mother   . Depression Mother   . Hypertension Father   . Diabetes Father     Social History   Tobacco Use  . Smoking status: Former Games developer  . Smokeless tobacco: Never Used  Substance Use Topics  . Alcohol use: Not Currently  . Drug use: Not Currently    Home Medications Prior to Admission medications   Medication Sig Start Date End Date Taking? Authorizing Provider  acetaminophen (TYLENOL) 325 MG tablet Take 650 mg by mouth daily as needed for pain.   Yes [provider]  ARIPiprazole (ABILIFY) 20 MG tablet Take 1 tablet (20 mg total) by mouth daily. 03/11/20 06/09/20 Yes King, Shana Chute, NP  escitalopram (LEXAPRO) 10 MG tablet Take 1 tablet (10 mg total) by mouth daily. Patient taking differently: Take 10 mg by mouth daily. Taking with 5mg  = 15mg  total daily 03/11/20 06/09/20 Yes 03/13/20, NP  escitalopram (LEXAPRO) 5 MG tablet Take 1 tablet (5 mg total) by mouth daily. Patient taking differently: Take 5 mg by mouth daily. Taking with 10mg  = 15mg  daily 03/11/20 06/09/20 Yes , NP  doxycycline (VIBRA-TABS) 100 MG tablet Take 1 tablet (100 mg total) by mouth 2 (two) times daily. 03/25/20   03/13/20, MD  meloxicam (MOBIC) 15 MG tablet Take 1 tablet (15 mg total) by mouth daily. 03/25/20  06/23/20  Linwood Dibbles, MD    Allergies    Adhesive [tape], Cherry, Other, Sulfamethoxazole-trimethoprim, Levofloxacin, Morphine, Gabapentin, Metformin, and Sumatriptan  Review of Systems   Review of Systems  All other systems reviewed and are negative.   Physical Exam Updated Vital Signs BP 136/88 (BP Location: Right Wrist)   Pulse 92   Temp 99.3 F (37.4 C) (Oral)   Resp 14   LMP 03/09/2020   SpO2 100%   Physical Exam Vitals and nursing note reviewed.  Constitutional:      General: She is not in acute distress.     Appearance: She is well-developed.  HENT:     Head: Normocephalic and atraumatic.     Right Ear: External ear normal.     Left Ear: External ear normal.  Eyes:     General: No scleral icterus.       Right eye: No discharge.        Left eye: No discharge.     Conjunctiva/sclera: Conjunctivae normal.  Neck:     Trachea: No tracheal deviation.  Cardiovascular:     Rate and Rhythm: Normal rate and regular rhythm.  Pulmonary:     Effort: Pulmonary effort is normal. No respiratory distress.     Breath sounds: Normal breath sounds. No stridor. No wheezing or rales.  Abdominal:     General: Bowel sounds are normal. There is no distension.     Palpations: Abdomen is soft.     Tenderness: There is abdominal tenderness in the right lower quadrant, suprapubic area and left lower quadrant. There is no guarding or rebound.  Genitourinary:    Labia:        Right: No rash or tenderness.        Left: No rash or tenderness.      Vagina: No signs of injury. No bleeding or lesions.     Uterus: Tender.      Adnexa:        Right: No mass or tenderness.         Left: No mass or tenderness.       Comments: Difficult to visualize cervix, some white discharge vaginal vault Musculoskeletal:        General: No tenderness.     Cervical back: Neck supple.  Skin:    General: Skin is warm and dry.     Findings: No rash.  Neurological:     Mental Status: She is alert.     Cranial Nerves: No cranial nerve deficit (no facial droop, extraocular movements intact, no slurred speech).     Sensory: No sensory deficit.     Motor: No abnormal muscle tone or seizure activity.     Coordination: Coordination normal.     ED Results / Procedures / Treatments   Labs (all labs ordered are listed, but only abnormal results are displayed) Labs Reviewed  WET PREP, GENITAL - Abnormal; Notable for the following components:      Result Value   Clue Cells Wet Prep HPF POC PRESENT (*)    WBC, Wet Prep HPF POC MODERATE  (*)    All other components within normal limits  BASIC METABOLIC PANEL - Abnormal; Notable for the following components:   Glucose, Bld 101 (*)    All other components within normal limits  CBC  URINALYSIS, ROUTINE W REFLEX MICROSCOPIC  RPR  HIV ANTIBODY (ROUTINE TESTING W REFLEX)  POC URINE PREG, ED  GC/CHLAMYDIA PROBE AMP ([place]) NOT AT [facility]  EKG None  Radiology No results found.  Procedures Procedures (including critical care time)  Medications Ordered in ED Medications  acetaminophen (TYLENOL) tablet 650 mg (650 mg Oral Given 03/25/20 0729)    ED Course  I have reviewed the triage vital signs and the nursing notes.  Pertinent labs & imaging results that were available during my care of the patient were reviewed by me and considered in my medical decision making (see chart for details).    MDM Rules/Calculators/A&P                          Patient presented to ED with complaints of lower abdominal pain.  Patient's laboratory tests show normal white blood cell count.  Electrolyte panel is unremarkable.  Urine does not suggest urinary tract infection or kidney stone.  I doubt pyelonephritis, UTI, appendicitis or diverticulitis.  Symptoms are not severe.  I doubt ovarian torsion.  Patient did have some tenderness on pelvic exam.  Will treat empirically for possible cervicitis.  It is possible her symptoms may be related to her history of ovarian cyst.  I discussed empiric treatment with ceftriaxone and doxycycline.  Patient refuses ceftriaxone injection right now.  I explained to her that that is the recommended treatment.  She would like to wait for her GC testing and if positive she would return. Final Clinical Impression(s) / ED Diagnoses Final diagnoses:  Pelvic pain    Rx / DC Orders ED Discharge Orders         Ordered    doxycycline (VIBRA-TABS) 100 MG tablet  2 times daily     Discontinue  Reprint     03/25/20 0941    meloxicam (MOBIC) 15 MG tablet  Daily      Discontinue  Reprint     03/25/20 0941           Dorie Rank, MD 03/25/20 [phone]

## 2020-03-28 LAB — GC/CHLAMYDIA PROBE AMP (~~LOC~~) NOT AT ARMC
Chlamydia: NEGATIVE
Comment: NEGATIVE
Comment: NORMAL
Neisseria Gonorrhea: NEGATIVE

## 2020-04-05 ENCOUNTER — Encounter: Payer: Medicare Other | Admitting: Physical Therapy

## 2020-04-12 DIAGNOSIS — N926 Irregular menstruation, unspecified: Secondary | ICD-10-CM | POA: Insufficient documentation

## 2020-04-21 ENCOUNTER — Telehealth: Payer: Self-pay | Admitting: Physical Therapy

## 2020-04-21 NOTE — Telephone Encounter (Signed)
Spoke with pt regarding increase in heel pain the last 2 weeks. PT encouraged pt to avoid any exercises that increase pain. Also encouraged pt to reach out to referring physician regarding work/volunteer note to allow more rest breaks during the day.  Will plan to see pt next week in the clinic.  4:15 PM,04/21/20 Donita Brooks PT, DPT [facility] [place]  [phone]

## 2020-04-27 ENCOUNTER — Ambulatory Visit: Payer: Medicare Other | Attending: Student | Admitting: Physical Therapy

## 2020-04-27 ENCOUNTER — Other Ambulatory Visit: Payer: Self-pay

## 2020-04-27 ENCOUNTER — Encounter: Payer: Self-pay | Admitting: Physical Therapy

## 2020-04-27 DIAGNOSIS — R262 Difficulty in walking, not elsewhere classified: Secondary | ICD-10-CM | POA: Diagnosis present

## 2020-04-27 DIAGNOSIS — M79672 Pain in left foot: Secondary | ICD-10-CM | POA: Diagnosis present

## 2020-04-27 NOTE — Therapy (Signed)
[facility] [address] [place], [place], [address] Phone: [phone]   Fax:  [phone]  Physical Therapy Treatment  Patient Details  Name: Rebekah Davis MRN: [id number] Date of Birth: 1986-08-15 Referring Provider (PT): Lowella Dell, MD   Encounter Date: 04/27/2020   PT End of Session - 04/27/20 1407    Visit Number 2    Date for PT Re-Evaluation 05/27/20    Authorization Type Medicare    Authorization Time Period 03/23/20 to 05/27/20    Authorization - Visit Number 2    Authorization - Number of Visits 10    PT Start Time 1400    PT Stop Time 1455    PT Time Calculation (min) 55 min    Activity Tolerance No increased pain;Patient tolerated treatment well    Behavior During Therapy [facility] for tasks assessed/performed           Past Medical History:  Diagnosis Date  . Arthritis   . Kidney stones   . Migraine   . PCOS (polycystic ovarian syndrome)     History reviewed. No pertinent surgical history.  There were no vitals filed for this visit.   Subjective Assessment - 04/27/20 1405    Subjective I had some pain in my ankle. The ice machine helped.    Limitations Walking;Standing    Patient Stated Goals not have surgery and decrease pain    Currently in Pain? Yes    Pain Score 6     Pain Location Heel    Pain Orientation Left;Posterior    Pain Descriptors / Indicators Aching;Burning    Pain Type Chronic pain    Pain Onset More than a month ago    Pain Frequency Constant    Aggravating Factors  standing, walking    Pain Relieving Factors resting, icing    Multiple Pain Sites No                             [facility] Adult PT Treatment/Exercise - 04/27/20 0001      Modalities   Modalities Vasopneumatic      Vasopneumatic   Number Minutes Vasopneumatic  15 minutes    Vasopnuematic Location  Ankle   left   Vasopneumatic Pressure Low    Vasopneumatic Temperature  3 snowflakes       Manual Therapy   Manual Therapy Passive ROM;Joint mobilization;Soft tissue mobilization    Joint Mobilization left ankle, talus, cuboid, distraction, cuboid grade III    Soft tissue mobilization to left gastroc, soleus, achilles tendon,       Ankle Exercises: Aerobic   Nustep level 1 for 5 minutes while assess patient      Ankle Exercises: Stretches   Gastroc Stretch 2 reps;30 seconds    Gastroc Stretch Limitations with towel roll under the calf and using the strap    Other Stretch A/AROM to ankle inversion, eversion, DF and PF 10 each way      Ankle Exercises: Seated   Ankle Circles/Pumps Left;10 reps                  PT Education - 04/27/20 1421    Education Details Access Code: [email or web address]    Person(s) Educated Patient    Methods Explanation;Demonstration;Verbal cues;Handout    Comprehension Returned demonstration;Verbalized understanding            PT Short Term Goals - 04/27/20 1443      PT  SHORT TERM GOAL #1   Title Pt will be independent with her initial HEP to increase ankle flexibility and decrease pain.    Baseline went over them today    Time 4    Period Weeks    Status On-going             PT Long Term Goals - 03/23/20 1207      PT LONG TERM GOAL #1   Title Pt will have atleast 10 deg of passive Lt ankle dorsiflexion to improve foot clearance during ambulation.    Time 8    Period Weeks    Status New      PT LONG TERM GOAL #2   Title Pt will be able to ambulate with adequate push off and step length x100 secondary to improved pain and gastroc strength.    Time 8    Period Weeks    Status New      PT LONG TERM GOAL #3   Title Pt will be able to complete atleast 10 reps of single leg heel raise with or without pain to reflect improvements in gastroc strength.    Time 8    Period Weeks    Status New      PT LONG TERM GOAL #4   Title Pt will be able to maintain single leg stance on the Lt for atleast 5 seconds, 2/3 trials, to reflect  increase in ankle proprioception.    Time 8    Period Weeks    Status New      PT LONG TERM GOAL #5   Title Pt will report atleast 60% improvement in heel pain and swelling from the start of PT.    Time 8    Period Weeks    Status New                 Plan - 04/27/20 1415    Clinical Impression Statement Patient did well after therapy. She had increased movement with less pain. Patient was education on how to move her left ankle with less pain. Patient understands on how to stretch the left calf with a towel roll under her calf. She had increased restrictions in theleft calf and achilles. Patient will benefit from skilled therapy to address her pain and limitations in ankle flexibility, strength and proprioception to increase her ability to participate in daily activity with her peers.    Personal Factors and Comorbidities Time since onset of injury/illness/exacerbation;Fitness    Examination-Activity Limitations Stairs;Stand    Camera operator;Yard Work    Stability/Clinical Decision Making Stable/Uncomplicated    Rehab Potential Good    PT Frequency 2x / week    PT Duration 8 weeks    PT Treatment/Interventions ADLs/Self Care Home Management;Aquatic Therapy;Cryotherapy;Iontophoresis 4mg /ml Dexamethasone;Therapeutic activities;Therapeutic exercise;Balance training;Neuromuscular re-education;Manual techniques;Stair training;Gait training;Dry needling;Passive range of motion;Taping;Patient/family education    PT Next Visit Plan manual to gastroc; trial isometrics PF and all other directions, strength-offloading achilles, vaso, nustep    PT Home Exercise Plan Access Code: [email or web address]    Consulted and Agree with Plan of Care Patient           Patient will benefit from skilled therapeutic intervention in order to improve the following deficits and impairments:  Abnormal gait, Decreased balance  Visit Diagnosis: Pain in left foot  Difficulty in  walking, not elsewhere classified     Problem List Patient Active Problem List   Diagnosis Date Noted  . Achilles tendinitis 03/11/2020  .  Ovarian cyst 02/19/2020  . Deliberate self-cutting 01/23/2020  . Non-suicidal self harm as coping mechanism (HCC) 12/25/2019  . Diarrhea 06/13/2018  . Hypomagnesemia 06/13/2018  . Transaminitis 06/13/2018  . C. difficile diarrhea 06/12/2018  . Oral thrush 06/12/2018  . Suicidal ideation 04/12/2018  . Chest pain 03/28/2018  . Obesity 03/28/2018  . OSA (obstructive sleep apnea) 03/28/2018  . Acute cystitis 03/10/2018  . GERD (gastroesophageal reflux disease) 03/10/2018  . Depression with suicidal ideation 03/09/2018  . PTSD (post-traumatic stress disorder) 03/09/2018  . 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 Polycystic disease, ovaries 07/05/2016  . Abdominal pain, suprapubic 06/04/2016  . Spondylisthesis 04/20/2015  . Herniated lumbar intervertebral disc 02/28/2013  . Sciatica of left side 02/28/2013  . Borderline personality disorder (HCC) 12/29/2012  . Anxiety 12/24/2012  . COPD (chronic obstructive pulmonary disease) (HCC) 12/24/2012  . Essential hypertension 12/24/2012  . Low back pain 07/22/2012    Eulis Foster, PT 04/27/20 2:44 PM   [facility] [address] [place], [place], [address] Phone: [phone]   Fax:  [phone]  Name: Rebekah Davis MRN: [id number] Date of Birth: Aug 25, 1986

## 2020-04-27 NOTE — Patient Instructions (Signed)
Access Code: [email or web address] URL: [email or web address] Date: 04/27/2020 Prepared by: Eulis Foster  Exercises Seated Ankle Circles - 3-4 x daily - 7 x weekly - 10 reps Ankle Circles in Elevation - 3-4 x daily - 7 x weekly - 10 reps Long Sitting Calf Stretch with Strap - 5 x daily - 7 x weekly - 1 reps - 10 seconds hold Seated Ankle Inversion Eversion PROM - 3 x daily - 7 x weekly - 1 sets - 10 reps Seated Ankle Plantarflexion Dorsiflexion PROM - 3 x daily - 7 x weekly - 10 reps [facility] [address] [place], [place] [address] Phone # [phone] Fax [phone]

## 2020-05-04 ENCOUNTER — Other Ambulatory Visit: Payer: Self-pay

## 2020-05-04 ENCOUNTER — Encounter: Payer: Self-pay | Admitting: Physical Therapy

## 2020-05-04 ENCOUNTER — Ambulatory Visit: Payer: Medicare Other | Admitting: Physical Therapy

## 2020-05-04 DIAGNOSIS — M79672 Pain in left foot: Secondary | ICD-10-CM | POA: Diagnosis not present

## 2020-05-04 DIAGNOSIS — R262 Difficulty in walking, not elsewhere classified: Secondary | ICD-10-CM

## 2020-05-04 NOTE — Therapy (Signed)
[facility] [address] [place], [place], [address] Phone: [phone]   Fax:  [phone]  Physical Therapy Treatment  Patient Details  Name: Rebekah Davis MRN: [id number] Date of Birth: 1986-03-11 Referring Provider (PT): Lowella Dell, MD   Encounter Date: 05/04/2020   PT End of Session - 05/04/20 1453    Visit Number 3    Date for PT Re-Evaluation 05/27/20    Authorization Type Medicare    Authorization Time Period 03/23/20 to 05/27/20    Authorization - Visit Number 3    Authorization - Number of Visits 10    PT Start Time 1445    PT Stop Time 1543    PT Time Calculation (min) 58 min    Activity Tolerance No increased pain;Patient tolerated treatment well    Behavior During Therapy [facility] for tasks assessed/performed           Past Medical History:  Diagnosis Date  . Arthritis   . Kidney stones   . Migraine   . PCOS (polycystic ovarian syndrome)     History reviewed. No pertinent surgical history.  There were no vitals filed for this visit.   Subjective Assessment - 05/04/20 1450    Subjective I can walk further. I had soreness in my calf after last session. The towel with the sheet works good.    Patient Stated Goals not have surgery and decrease pain    Currently in Pain? Yes    Pain Score 6     Pain Location Heel    Pain Orientation Left;Posterior    Pain Descriptors / Indicators Aching;Burning    Pain Type Chronic pain    Pain Onset More than a month ago    Pain Frequency Constant    Aggravating Factors  standing, walking    Pain Relieving Factors resting, icing    Multiple Pain Sites No                             [facility] Adult PT Treatment/Exercise - 05/04/20 0001      Modalities   Modalities Vasopneumatic      Vasopneumatic   Number Minutes Vasopneumatic  15 minutes    Vasopnuematic Location  Ankle   left   Vasopneumatic Pressure Low    Vasopneumatic  Temperature  3 snowflakes      Manual Therapy   Manual Therapy Joint mobilization;Soft tissue mobilization;Myofascial release;Passive ROM    Joint Mobilization left ankle, talus, cuboid, distraction, cuboid grade III    Soft tissue mobilization to left gastroc, soleus, achilles tendon,     Myofascial Release used a suction cup to pull the skin off the musclc to elongate the fascia    Passive ROM left ankle for all directions after manual work      Ankle Exercises: Aerobic   Nustep level 1 for 5 minutes while assess patient   had pain level 6/10     Ankle Exercises: Seated   Heel Raises Left;10 reps;1 second   monitoring for pain    BAPS Sitting;Level 1;10 reps   all dirctions monitoring for pain    Other Seated Ankle Exercises left foot isometrics holding for 5 sec 5x each direction      Ankle Exercises: Stretches   Other Stretch A/AROM to ankle inversion, eversion, DF and PF 10 each way  PT Short Term Goals - 05/04/20 1531      PT SHORT TERM GOAL #1   Title Pt will be independent with her initial HEP to increase ankle flexibility and decrease pain.    Time 4    Period Weeks    Status Achieved             PT Long Term Goals - 03/23/20 1207      PT LONG TERM GOAL #1   Title Pt will have atleast 10 deg of passive Lt ankle dorsiflexion to improve foot clearance during ambulation.    Time 8    Period Weeks    Status New      PT LONG TERM GOAL #2   Title Pt will be able to ambulate with adequate push off and step length x100 secondary to improved pain and gastroc strength.    Time 8    Period Weeks    Status New      PT LONG TERM GOAL #3   Title Pt will be able to complete atleast 10 reps of single leg heel raise with or without pain to reflect improvements in gastroc strength.    Time 8    Period Weeks    Status New      PT LONG TERM GOAL #4   Title Pt will be able to maintain single leg stance on the Lt for atleast 5 seconds, 2/3  trials, to reflect increase in ankle proprioception.    Time 8    Period Weeks    Status New      PT LONG TERM GOAL #5   Title Pt will report atleast 60% improvement in heel pain and swelling from the start of PT.    Time 8    Period Weeks    Status New                 Plan - 05/04/20 1528    Clinical Impression Statement Patient reports she is able to walk a little longer. Patient has fascial tightness in the calf. Patient  has pain level with nustep at 6/10. Patient is getting more dorsiflexion with knee flexed after manual work. Patient is doing her HEP. Patient will walk with her left foot inverted due to pain. Patient will benefit from skilled therapy to  address her pain and limitations in ankle flexiblity, strength and proprioception to increase her ability to participate in daily activity with her peers.    Personal Factors and Comorbidities Time since onset of injury/illness/exacerbation;Fitness    Examination-Activity Limitations Stairs;Stand    Camera operator;Yard Work    Stability/Clinical Decision Making Stable/Uncomplicated    Rehab Potential Good    PT Frequency 2x / week    PT Duration 8 weeks    PT Treatment/Interventions ADLs/Self Care Home Management;Aquatic Therapy;Cryotherapy;Iontophoresis 4mg /ml Dexamethasone;Therapeutic activities;Therapeutic exercise;Balance training;Neuromuscular re-education;Manual techniques;Stair training;Gait training;Dry needling;Passive range of motion;Taping;Patient/family education    PT Next Visit Plan manual to gastroc; trial isometrics PF and all other directions, strength-offloading achilles, vaso, nustep; take measurements of Left ankle, maybe dry needling to the left calf    PT Home Exercise Plan Access Code: [email or web address]    Consulted and Agree with Plan of Care Patient           Patient will benefit from skilled therapeutic intervention in order to improve the following deficits and  impairments:  Abnormal gait, Decreased balance  Visit Diagnosis: Pain in left foot  Difficulty in walking, not elsewhere classified  Problem List Patient Active Problem List   Diagnosis Date Noted  . Achilles tendinitis 03/11/2020  . Ovarian cyst 02/19/2020  . Deliberate self-cutting 01/23/2020  . Non-suicidal self harm as coping mechanism (HCC) 12/25/2019  . Diarrhea 06/13/2018  . Hypomagnesemia 06/13/2018  . Transaminitis 06/13/2018  . C. difficile diarrhea 06/12/2018  . Oral thrush 06/12/2018  . Suicidal ideation 04/12/2018  . Chest pain 03/28/2018  . Obesity 03/28/2018  . OSA (obstructive sleep apnea) 03/28/2018  . Acute cystitis 03/10/2018  . GERD (gastroesophageal reflux disease) 03/10/2018  . Depression with suicidal ideation 03/09/2018  . PTSD (post-traumatic stress disorder) 03/09/2018  . 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 Polycystic disease, ovaries 07/05/2016  . Abdominal pain, suprapubic 06/04/2016  . Spondylisthesis 04/20/2015  . Herniated lumbar intervertebral disc 02/28/2013  . Sciatica of left side 02/28/2013  . Borderline personality disorder (HCC) 12/29/2012  . Anxiety 12/24/2012  . COPD (chronic obstructive pulmonary disease) (HCC) 12/24/2012  . Essential hypertension 12/24/2012  . Low back pain 07/22/2012    Eulis Foster, PT 05/04/20 3:32 PM   [facility] [address] [place], [place], [address] Phone: [phone]   Fax:  [phone]  Name: Alysiah Suppa MRN: [id number] Date of Birth: 1986-09-21

## 2020-05-10 ENCOUNTER — Other Ambulatory Visit: Payer: Self-pay

## 2020-05-10 ENCOUNTER — Ambulatory Visit: Payer: Medicare Other | Admitting: Physical Therapy

## 2020-05-10 ENCOUNTER — Encounter: Payer: Self-pay | Admitting: Physical Therapy

## 2020-05-10 DIAGNOSIS — M79672 Pain in left foot: Secondary | ICD-10-CM | POA: Diagnosis not present

## 2020-05-10 DIAGNOSIS — R262 Difficulty in walking, not elsewhere classified: Secondary | ICD-10-CM

## 2020-05-10 NOTE — Patient Instructions (Signed)
Access Code: [email or web address] URL: [email or web address] Date: 05/10/2020 Prepared by: [facility]  Exercises .Seated Ankle Circles - 3-4 x daily - 7 x weekly - 10 reps .Long Sitting Calf Stretch with Strap - 5 x daily - 7 x weekly - 1 reps - 10 seconds hold .Seated Ankle Inversion Eversion PROM - 3 x daily - 7 x weekly - 1 sets - 10 reps .Seated Heel Slide - 1-2 x daily - 7 x weekly - 10 reps .Seated Lesser Toes Extension - 1 x daily - 7 x weekly - 2 sets - 10 reps   [facility] [address] [place], [place] [address] Phone # [phone] Fax [phone]

## 2020-05-10 NOTE — Therapy (Signed)
[facility] [address] [place], [place], [address] Phone: [phone]   Fax:  [phone]  Physical Therapy Treatment  Patient Details  Name: Rebekah Davis MRN: [id number] Date of Birth: [birth year] Referring Provider (PT): Lowella Dell, MD   Encounter Date: 05/10/2020   PT End of Session - 05/10/20 1448    Visit Number 4    Date for PT Re-Evaluation 05/27/20    Authorization Type Medicare    Authorization Time Period 03/23/20 to 05/27/20    Authorization - Visit Number 4    Authorization - Number of Visits 10    PT Start Time 1400    PT Stop Time 1455    PT Time Calculation (min) 55 min    Activity Tolerance No increased pain;Patient tolerated treatment well    Behavior During Therapy [facility] for tasks assessed/performed           Past Medical History:  Diagnosis Date  . Arthritis   . Kidney stones   . Migraine   . PCOS (polycystic ovarian syndrome)     History reviewed. No pertinent surgical history.  There were no vitals filed for this visit.   Subjective Assessment - 05/10/20 1402    Subjective Pt states that things are going ok. She is working on her HEP.    Patient Stated Goals not have surgery and decrease pain    Currently in Pain? Yes    Pain Score 4     Pain Location Ankle    Pain Orientation Left;Posterior;Medial    Pain Descriptors / Indicators Aching;Sore    Pain Type Chronic pain    Pain Radiating Towards none    Pain Onset More than a month ago    Pain Frequency Intermittent    Aggravating Factors  walking, standing increased DF                             [facility] Adult PT Treatment/Exercise - 05/10/20 0001      Vasopneumatic   Number Minutes Vasopneumatic  10 minutes    Vasopnuematic Location  Ankle    Vasopneumatic Pressure Medium    Vasopneumatic Temperature  3 snowflakes      Manual Therapy   Joint Mobilization Lt AP joint mobilization grade III-IV x3  bouts talocrural joint       Ankle Exercises: Seated   [place] Pickup Lt large and small 1 trial    BAPS Sitting;Level 1   AP, circles counterclockwise x10 reps    Other Seated Ankle Exercises Lt ankle inversion isometric 10x3 sec hold; Lt great extension x5 reps in 1-2 minutes; Lt ankle DF/PF slide on floor x10 reps     Other Seated Ankle Exercises Lt arch squeeze 10x3 sec hold; Lt ankle PF isometric 5x5 sec, inversion 8x5 sec hold                   PT Education - 05/10/20 1448    Education Details technique with therex; HEP updates    Person(s) Educated Patient    Methods Explanation;Verbal cues;Handout    Comprehension Verbalized understanding;Returned demonstration            PT Short Term Goals - 05/04/20 1531      PT SHORT TERM GOAL #1   Title Pt will be independent with her initial HEP to increase ankle flexibility and decrease pain.    Time 4    Period Weeks  Status Achieved             PT Long Term Goals - 03/23/20 1207      PT LONG TERM GOAL #1   Title Pt will have atleast 10 deg of passive Lt ankle dorsiflexion to improve foot clearance during ambulation.    Time 8    Period Weeks    Status New      PT LONG TERM GOAL #2   Title Pt will be able to ambulate with adequate push off and step length x100 secondary to improved pain and gastroc strength.    Time 8    Period Weeks    Status New      PT LONG TERM GOAL #3   Title Pt will be able to complete atleast 10 reps of single leg heel raise with or without pain to reflect improvements in gastroc strength.    Time 8    Period Weeks    Status New      PT LONG TERM GOAL #4   Title Pt will be able to maintain single leg stance on the Lt for atleast 5 seconds, 2/3 trials, to reflect increase in ankle proprioception.    Time 8    Period Weeks    Status New      PT LONG TERM GOAL #5   Title Pt will report atleast 60% improvement in heel pain and swelling from the start of PT.    Time 8    Period  Weeks    Status New                 Plan - 05/10/20 1449    Clinical Impression Statement Pt continues to have Lt heel pain with standing and walking. Pt has difficulty with intrinsic foot control during seated exercises. Pt's ambulation was improved with cuing to activate great toe flexion. Ended with joint mobilization to increase ankle flexibility. Pt's HEP was updated to reflect progressions with today's exercises.    Personal Factors and Comorbidities Time since onset of injury/illness/exacerbation;Fitness    Examination-Activity Limitations Stairs;Stand    Camera operator;Yard Work    Stability/Clinical Decision Making Stable/Uncomplicated    Rehab Potential Good    PT Frequency 2x / week    PT Duration 8 weeks    PT Treatment/Interventions ADLs/Self Care Home Management;Aquatic Therapy;Cryotherapy;Iontophoresis 4mg /ml Dexamethasone;Therapeutic activities;Therapeutic exercise;Balance training;Neuromuscular re-education;Manual techniques;Stair training;Gait training;Dry needling;Passive range of motion;Taping;Patient/family education    PT Next Visit Plan manual to gastroc; progress isometrics, strength-offloading achilles, vaso, nustep; maybe dry needling to the left calf-pt is not a fan of this initially but may change her mind    PT Home Exercise Plan Access Code: [email or web address]    Consulted and Agree with Plan of Care Patient           Patient will benefit from skilled therapeutic intervention in order to improve the following deficits and impairments:  Abnormal gait, Decreased balance  Visit Diagnosis: Pain in left foot  Difficulty in walking, not elsewhere classified     Problem List Patient Active Problem List   Diagnosis Date Noted  . Achilles tendinitis 03/11/2020  . Ovarian cyst 02/19/2020  . Deliberate self-cutting 01/23/2020  . Non-suicidal self harm as coping mechanism (HCC) 12/25/2019  . Diarrhea 06/13/2018  .  Hypomagnesemia 06/13/2018  . Transaminitis 06/13/2018  . C. difficile diarrhea 06/12/2018  . Oral thrush 06/12/2018  . Suicidal ideation 04/12/2018  . Chest pain 03/28/2018  . Obesity 03/28/2018  . OSA (obstructive  sleep apnea) 03/28/2018  . Acute cystitis 03/10/2018  . GERD (gastroesophageal reflux disease) 03/10/2018  . Depression with suicidal ideation 03/09/2018  . PTSD (post-traumatic stress disorder) 03/09/2018  . 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 Polycystic disease, ovaries 07/05/2016  . Abdominal pain, suprapubic 06/04/2016  . Spondylisthesis 04/20/2015  . Herniated lumbar intervertebral disc 02/28/2013  . Sciatica of left side 02/28/2013  . Borderline personality disorder (HCC) 12/29/2012  . Anxiety 12/24/2012  . COPD (chronic obstructive pulmonary disease) (HCC) 12/24/2012  . Essential hypertension 12/24/2012  . Low back pain 07/22/2012    3:43 PM,05/10/20 Donita Brooks PT, DPT [facility]  [phone]  [facility] [address] [place], [place], [address] Phone: [phone]   Fax:  [phone]  Name: Rebekah Davis MRN: [id number] Date of Birth: 1986/03/03

## 2020-05-17 ENCOUNTER — Other Ambulatory Visit: Payer: Self-pay

## 2020-05-17 ENCOUNTER — Encounter: Payer: Self-pay | Admitting: Physical Therapy

## 2020-05-17 ENCOUNTER — Ambulatory Visit: Payer: Medicare Other | Attending: Student | Admitting: Physical Therapy

## 2020-05-17 DIAGNOSIS — M79672 Pain in left foot: Secondary | ICD-10-CM | POA: Insufficient documentation

## 2020-05-17 DIAGNOSIS — R262 Difficulty in walking, not elsewhere classified: Secondary | ICD-10-CM | POA: Insufficient documentation

## 2020-05-17 NOTE — Therapy (Signed)
[facility] [address] [place], [place], [address] Phone: [phone]   Fax:  [phone]  Physical Therapy Treatment  Patient Details  Name: Rebekah Davis MRN: [id number] Date of Birth: Aug 24, 1986 Referring Provider (PT): Burnett Sheng, MD   Encounter Date: 05/17/2020   PT End of Session - 05/17/20 1611    Visit Number 5    Date for PT Re-Evaluation 05/27/20    Authorization Type Medicare    Authorization Time Period 03/23/20 to 05/27/20    Authorization - Visit Number 5    Authorization - Number of Visits 10    PT Start Time 1400    PT Stop Time 7793    PT Time Calculation (min) 53 min    Activity Tolerance No increased pain;Patient tolerated treatment well    Behavior During Therapy [facility] for tasks assessed/performed           Past Medical History:  Diagnosis Date  . Arthritis   . Kidney stones   . Migraine   . PCOS (polycystic ovarian syndrome)     History reviewed. No pertinent surgical history.  There were no vitals filed for this visit.   Subjective Assessment - 05/17/20 1359    Subjective Pt states that things are going ok. She is working on her HEP.    Patient Stated Goals not have surgery and decrease pain    Pain Onset More than a month ago              [facility] PT Assessment - 05/17/20 0001      AROM   Left Ankle Dorsiflexion -5   5 deg with knee flexed                        [facility] Adult PT Treatment/Exercise - 05/17/20 0001      Self-Care   Self-Care Other Self-Care Comments    Other Self-Care Comments  night splint for maintaining DF during sleep; heel lift while standing/walking longer periods of time      Vasopneumatic   Number Minutes Vasopneumatic  10 minutes    Vasopnuematic Location  Ankle    Vasopneumatic Pressure Medium    Vasopneumatic Temperature  3 snowflakes      Manual Therapy   Joint Mobilization Lt AP joint mobilization grade III-IV x3 bouts  talocrural joint     Soft tissue mobilization Lt gastroc, soleus      Ankle Exercises: Seated   Heel Slides Left;10 reps   on slider    Other Seated Ankle Exercises Lt ankle DF yellow TB x10 reps     Other Seated Ankle Exercises Lt ankle PF with great toe flexion yellow TB 2x10 reps       Ankle Exercises: Standing   SLS x5 sec 3 trials on Lt     Other Standing Ankle Exercises arch squeeze 10x3 sec hold                   PT Education - 05/17/20 1611    Education Details updated HEP    Person(s) Educated Patient    Methods Explanation;Handout;Verbal cues    Comprehension Verbalized understanding;Returned demonstration            PT Short Term Goals - 05/04/20 1531      PT SHORT TERM GOAL #1   Title Pt will be independent with her initial HEP to increase ankle flexibility and decrease pain.    Time 4  Period Weeks    Status Achieved             PT Long Term Goals - 05/17/20 1414      PT LONG TERM GOAL #1   Title Pt will have atleast 10 deg of passive Lt ankle dorsiflexion to improve foot clearance during ambulation.    Baseline lacking no more than 5 deg from neutral    Time 8    Period Weeks    Status Partially Met      PT LONG TERM GOAL #2   Title Pt will be able to ambulate with adequate push off and step length x100 secondary to improved pain and gastroc strength.    Baseline improved with addition of heel lift    Time 8    Period Weeks    Status Partially Met      PT LONG TERM GOAL #3   Title Pt will be able to complete atleast 10 reps of single leg heel raise with or without pain to reflect improvements in gastroc strength.    Time 8    Period Weeks    Status Unable to assess      PT LONG TERM GOAL #4   Title Pt will be able to maintain single leg stance on the Lt for atleast 10 seconds, 2/3 trials, to reflect increase in ankle proprioception.    Baseline Pt will be able to maintain single leg stance on the Lt for atleast 5 seconds, 2/3  trials, to reflect increase in ankle proprioception.    Time 8    Period Weeks    Status Revised      PT LONG TERM GOAL #5   Title Pt will report atleast 60% improvement in heel pain and swelling from the start of PT.    Baseline improved standing    Time 8    Period Weeks    Status On-going                 Plan - 05/17/20 1612    Clinical Impression Statement Pt has increase in active ankle dorsiflexion by atleast 5 deg with the knee extended and flexed. Pt's walking improved with addition of heel lift. She was encouraged to use this throughout the day. PT provided information on local medical supply stores and encouraged her to use a night splint to help maintain ankle DF ROM during the night. Pt has made progress towards her short- and long-term goals. She would continue to benefit from skilled PT moving forward.    Personal Factors and Comorbidities Time since onset of injury/illness/exacerbation;Fitness    Examination-Activity Limitations Stairs;Stand    Geologist, engineering;Yard Work    Stability/Clinical Decision Making Stable/Uncomplicated    Rehab Potential Good    PT Frequency 2x / week    PT Duration 8 weeks    PT Treatment/Interventions ADLs/Self Care Home Management;Aquatic Therapy;Cryotherapy;Iontophoresis 63m/ml Dexamethasone;Therapeutic activities;Therapeutic exercise;Balance training;Neuromuscular re-education;Manual techniques;Stair training;Gait training;Dry needling;Passive range of motion;Taping;Patient/family education    PT Next Visit Plan extend cert: 156/3/87 manual to gastroc and possible d/n; progress isometrics ankle PF/toe flexion, strength-offloading achilles, vaso    PT Home Exercise Plan Access Code: [email or web address]   Consulted and Agree with Plan of Care Patient           Patient will benefit from skilled therapeutic intervention in order to improve the following deficits and impairments:  Abnormal gait, Decreased  balance  Visit Diagnosis: Pain in left foot  Difficulty in walking, not  elsewhere classified     Problem List Patient Active Problem List   Diagnosis Date Noted  . Achilles tendinitis 03/11/2020  . Ovarian cyst 02/19/2020  . Deliberate self-cutting 01/23/2020  . Non-suicidal self harm as coping mechanism ([place]) 12/25/2019  . Diarrhea 06/13/2018  . Hypomagnesemia 06/13/2018  . Transaminitis 06/13/2018  . C. difficile diarrhea 06/12/2018  . Oral thrush 06/12/2018  . Suicidal ideation 04/12/2018  . Chest pain 03/28/2018  . Obesity 03/28/2018  . OSA (obstructive sleep apnea) 03/28/2018  . Acute cystitis 03/10/2018  . GERD (gastroesophageal reflux disease) 03/10/2018  . Depression with suicidal ideation 03/09/2018  . PTSD (post-traumatic stress disorder) 03/09/2018  . Major depressive disorder, recurrent severe without psychotic features ([place]) 02/27/2018  . Polycystic disease, ovaries 07/05/2016  . Abdominal pain, suprapubic 06/04/2016  . Spondylisthesis 04/20/2015  . Herniated lumbar intervertebral disc 02/28/2013  . Sciatica of left side 02/28/2013  . Borderline personality disorder ([place]) 12/29/2012  . Anxiety 12/24/2012  . COPD (chronic obstructive pulmonary disease) ([place]) 12/24/2012  . Essential hypertension 12/24/2012  . Low back pain 07/22/2012    4:17 PM,05/17/20 Sherol Dade PT, DPT [place] [facility] [address], [place], [place], [address] Phone: [phone]   Fax:  [phone]  Name: Khristi Schiller MRN: [id number] Date of Birth: 01-17-1986

## 2020-05-24 ENCOUNTER — Other Ambulatory Visit: Payer: Self-pay

## 2020-05-24 ENCOUNTER — Encounter: Payer: Self-pay | Admitting: Physical Therapy

## 2020-05-24 ENCOUNTER — Ambulatory Visit: Payer: Medicare Other | Admitting: Physical Therapy

## 2020-05-24 DIAGNOSIS — M79672 Pain in left foot: Secondary | ICD-10-CM | POA: Diagnosis not present

## 2020-05-24 DIAGNOSIS — R262 Difficulty in walking, not elsewhere classified: Secondary | ICD-10-CM

## 2020-05-24 NOTE — Therapy (Signed)
[facility] [address], [place], [place], [address] Phone: [phone]   Fax:  [phone]  Physical Therapy Treatment  Patient Details  Name: Rebekah Davis MRN: [id number] Date of Birth: Dec 15, 1985 Referring Provider (PT): Burnett Sheng, MD   Encounter Date: 05/24/2020   PT End of Session - 05/24/20 1436    Visit Number 6    Date for PT Re-Evaluation 07/21/20    Authorization Type Medicare    Authorization Time Period 05/28/20 to 07/21/20    Authorization - Visit Number 6    Authorization - Number of Visits 16    PT Start Time 1400    PT Stop Time 6440    PT Time Calculation (min) 39 min    Activity Tolerance No increased pain;Patient tolerated treatment well    Behavior During Therapy [facility] for tasks assessed/performed           Past Medical History:  Diagnosis Date  . Arthritis   . Kidney stones   . Migraine   . PCOS (polycystic ovarian syndrome)     History reviewed. No pertinent surgical history.  There were no vitals filed for this visit.   Subjective Assessment - 05/24/20 1402    Subjective Pt states that she was instructed to stop taking her pain medication because she was starting to get stomach irritation. Pt was able to make her own heel lift which has helped alot. She has 3/10 pain currently.    Patient Stated Goals not have surgery and decrease pain    Currently in Pain? Yes    Pain Score 3     Pain Location Heel    Pain Orientation Left;Posterior;Distal    Pain Descriptors / Indicators Aching;Dull    Pain Radiating Towards none    Pain Onset More than a month ago    Pain Frequency Intermittent    Aggravating Factors  alot of standing/walking              [facility] PT Assessment - 05/24/20 0001      AROM   Left Ankle Dorsiflexion -5   5 deg with knee flexed     Strength   Left Ankle Dorsiflexion 5/5    Left Ankle Inversion 4+/5    Left Ankle Eversion 5/5      Palpation    Palpation comment tender to palpation achilles insertion at Lt calcaneus                         [facility] Adult PT Treatment/Exercise - 05/24/20 0001      Manual Therapy   Joint Mobilization unable due to pain with pressure on back of heel     Soft tissue mobilization Lt gastroc/soleus       Ankle Exercises: Supine   Other Supine Ankle Exercises Lt gastroc on foam roll press and active DF stretch       Ankle Exercises: Seated   Other Seated Ankle Exercises Rt ankle PF isometric 10x5 sec hold     Other Seated Ankle Exercises attempted PF with great toe flexion using red TB-unable secondary to pain                     PT Short Term Goals - 05/04/20 1531      PT SHORT TERM GOAL #1   Title Pt will be independent with her initial HEP to increase ankle flexibility and decrease pain.    Time 4  Period Weeks    Status Achieved             PT Long Term Goals - 05/24/20 1409      PT LONG TERM GOAL #1   Title Pt will have atleast 10 deg of passive Lt ankle dorsiflexion to improve foot clearance during ambulation.    Baseline lacking no more than 5 deg from neutral    Time 8    Period Weeks    Status Partially Met      PT LONG TERM GOAL #2   Title Pt will be able to ambulate with adequate push off and step length x100 secondary to improved pain and gastroc strength.    Baseline improved with addition of heel lift    Time 8    Period Weeks    Status Partially Met      PT LONG TERM GOAL #3   Title Pt will be able to complete atleast 10 reps of single leg heel raise with or without pain to reflect improvements in gastroc strength.    Baseline unable    Time 8    Period Weeks    Status Unable to assess      PT LONG TERM GOAL #4   Title Pt will be able to maintain single leg stance on the Lt for atleast 10 seconds, 2/3 trials, to reflect increase in ankle proprioception.    Baseline Pt will be able to maintain single leg stance on the Lt for atleast 5  seconds, 2/3 trials, to reflect increase in ankle proprioception.    Time 8    Period Weeks    Status Revised      PT LONG TERM GOAL #5   Title Pt will report atleast 60% improvement in heel pain and swelling from the start of PT.    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 Pt has increase in active ankle dorsiflexion to atleast 5 deg with the knee extended and flexed. Pt's walking improved with the addition of a heel lift that she uses throughout the day. Pt's ankle proprioception has improved, and she is able to maintain single leg stance for atleast 5 seconds currently. Pt continues to lack ankle strength and flexibility, and her activity participation is limited. She would continue to benefit from skilled PT to address remaining deficits in ankle ROM, strength and overall mobility and activity participation.    Personal Factors and Comorbidities Time since onset of injury/illness/exacerbation;Fitness    Examination-Activity Limitations Stairs;Stand    Geologist, engineering;Yard Work    Stability/Clinical Decision Making Stable/Uncomplicated    Rehab Potential Good    PT Frequency 2x / week    PT Duration 8 weeks    PT Treatment/Interventions ADLs/Self Care Home Management;Aquatic Therapy;Cryotherapy;Iontophoresis 23m/ml Dexamethasone;Therapeutic activities;Therapeutic exercise;Balance training;Neuromuscular re-education;Manual techniques;Stair training;Gait training;Dry needling;Passive range of motion;Taping;Patient/family education    PT Next Visit Plan manual to gastroc and possible d/n; progress isometrics ankle PF/toe flexion, strength-offloading achilles, vaso if needed    PT Home Exercise Plan Access Code: [email or web address]   Consulted and Agree  with Plan of Care Patient           Patient will benefit  from skilled therapeutic intervention in order to improve the following deficits and impairments:  Abnormal gait, Decreased balance  Visit Diagnosis: Pain in left foot  Difficulty in walking, not elsewhere classified     Problem List Patient Active Problem List   Diagnosis Date Noted  . Achilles tendinitis 03/11/2020  . Ovarian cyst 02/19/2020  . Deliberate self-cutting 01/23/2020  . Non-suicidal self harm as coping mechanism ([place]) 12/25/2019  . Diarrhea 06/13/2018  . Hypomagnesemia 06/13/2018  . Transaminitis 06/13/2018  . C. difficile diarrhea 06/12/2018  . Oral thrush 06/12/2018  . Suicidal ideation 04/12/2018  . Chest pain 03/28/2018  . Obesity 03/28/2018  . OSA (obstructive sleep apnea) 03/28/2018  . Acute cystitis 03/10/2018  . GERD (gastroesophageal reflux disease) 03/10/2018  . Depression with suicidal ideation 03/09/2018  . PTSD (post-traumatic stress disorder) 03/09/2018  . Major depressive disorder, recurrent severe without psychotic features ([place]) 02/27/2018  . Polycystic disease, ovaries 07/05/2016  . Abdominal pain, suprapubic 06/04/2016  . Spondylisthesis 04/20/2015  . Herniated lumbar intervertebral disc 02/28/2013  . Sciatica of left side 02/28/2013  . Borderline personality disorder ([place]) 12/29/2012  . Anxiety 12/24/2012  . COPD (chronic obstructive pulmonary disease) ([place]) 12/24/2012  . Essential hypertension 12/24/2012  . Low back pain 07/22/2012    3:29 PM,05/24/20 Sherol Dade PT, DPT [place] [facility] [place] [facility] [address] [place], [place], [address] Phone: [phone]   Fax:  [phone]  Name: Rebekah Davis MRN: [id number] Date of Birth: [birth year]

## 2020-05-31 ENCOUNTER — Encounter: Payer: Self-pay | Admitting: Physical Therapy

## 2020-05-31 ENCOUNTER — Ambulatory Visit: Payer: Medicare Other | Admitting: Physical Therapy

## 2020-05-31 ENCOUNTER — Other Ambulatory Visit: Payer: Self-pay

## 2020-05-31 DIAGNOSIS — M79672 Pain in left foot: Secondary | ICD-10-CM | POA: Diagnosis not present

## 2020-05-31 DIAGNOSIS — R262 Difficulty in walking, not elsewhere classified: Secondary | ICD-10-CM

## 2020-05-31 NOTE — Therapy (Signed)
[facility] [address] [place], [place], [address] Phone: [phone]   Fax:  [phone]  Physical Therapy Treatment  Patient Details  Name: Rebekah Davis MRN: [id number] Date of Birth: [birth year] Referring Provider (PT): Burnett Sheng, MD   Encounter Date: 05/31/2020   PT End of Session - 05/31/20 1016    Visit Number 7    Date for PT Re-Evaluation 07/21/20    Authorization Type Medicare    Authorization Time Period 05/28/20 to 07/21/20    Authorization - Visit Number 7    Authorization - Number of Visits 16    PT Start Time 0930    PT Stop Time 1015    PT Time Calculation (min) 45 min    Activity Tolerance No increased pain;Patient tolerated treatment well    Behavior During Therapy [facility] for tasks assessed/performed           Past Medical History:  Diagnosis Date  . Arthritis   . Kidney stones   . Migraine   . PCOS (polycystic ovarian syndrome)     History reviewed. No pertinent surgical history.  There were no vitals filed for this visit.   Subjective Assessment - 05/31/20 0934    Subjective Pt states that she had a rough week. Her pain has increaed since getting off of her pain meds. She has some improvements in her pain with tylenol and ice.    Patient Stated Goals not have surgery and decrease pain    Currently in Pain? Yes    Pain Score 4     Pain Location Heel    Pain Orientation Right;Posterior    Pain Descriptors / Indicators Aching;Sore    Pain Type Chronic pain    Pain Radiating Towards none    Pain Onset More than a month ago    Pain Frequency Constant    Aggravating Factors  walking, standing, stairs                             [facility] Adult PT Treatment/Exercise - 05/31/20 0001      Modalities   Modalities Cryotherapy      Cryotherapy   Number Minutes Cryotherapy 5 Minutes    Cryotherapy Location Ankle   Lt heel   Type of Cryotherapy Ice pack       Manual Therapy   Soft tissue mobilization Lt lateral gastroc/soleus-trigger point release Lt lateral gastroc       Ankle Exercises: Seated   [place] Pickup Lt LE: small x1 trial- stopped due to pain     Other Seated Ankle Exercises Lt great toe flexion yellow TB 2x10 reps; Lt ankle PF isometric hold into half bosu 2x10 reps-3 sec hold     Other Seated Ankle Exercises Lt ankle PF with great toe flexion yellow TB x10 reps                   PT Education - 05/31/20 1015    Education Details technique with therex; encouraged pt to monitor pain response with exercises and call the office if having any issues    Person(s) Educated Patient    Methods Explanation    Comprehension Verbalized understanding            PT Short Term Goals - 05/04/20 1531      PT SHORT TERM GOAL #1   Title Pt will be independent with her initial HEP to increase ankle  flexibility and decrease pain.    Time 4    Period Weeks    Status Achieved             PT Long Term Goals - 05/24/20 1409      PT LONG TERM GOAL #1   Title Pt will have atleast 10 deg of passive Lt ankle dorsiflexion to improve foot clearance during ambulation.    Baseline lacking no more than 5 deg from neutral    Time 8    Period Weeks    Status Partially Met      PT LONG TERM GOAL #2   Title Pt will be able to ambulate with adequate push off and step length x100 secondary to improved pain and gastroc strength.    Baseline improved with addition of heel lift    Time 8    Period Weeks    Status Partially Met      PT LONG TERM GOAL #3   Title Pt will be able to complete atleast 10 reps of single leg heel raise with or without pain to reflect improvements in gastroc strength.    Baseline unable    Time 8    Period Weeks    Status Unable to assess      PT LONG TERM GOAL #4   Title Pt will be able to maintain single leg stance on the Lt for atleast 10 seconds, 2/3 trials, to reflect increase in ankle proprioception.     Baseline Pt will be able to maintain single leg stance on the Lt for atleast 5 seconds, 2/3 trials, to reflect increase in ankle proprioception.    Time 8    Period Weeks    Status Revised      PT LONG TERM GOAL #5   Title Pt will report atleast 60% improvement in heel pain and swelling from the start of PT.    Baseline improved standing    Time 8    Period Weeks    Status On-going                 Plan - 05/31/20 1016    Clinical Impression Statement Pt has increase in ankle discomfort throughout the last week having been off her prescription medication. Pt has felt improvements in her soreness with Tylenol and ice/rest, but she was limited with her ability to participate in several of today's exercises secondary to high levels of pain. Pt has significant trigger points in the lateral gastroc. She is hesitant to try dry needling at this time, so PT completed soft tissue mobilization and trigger point release to improve muscle flexibility. Ended with ice pack applied to Lt heel end of session.    Personal Factors and Comorbidities Time since onset of injury/illness/exacerbation;Fitness    Examination-Activity Limitations Stairs;Stand    Geologist, engineering;Yard Work    Stability/Clinical Decision Making Stable/Uncomplicated    Rehab Potential Good    PT Frequency 2x / week    PT Duration 8 weeks    PT Treatment/Interventions ADLs/Self Care Home Management;Aquatic Therapy;Cryotherapy;Iontophoresis 76m/ml Dexamethasone;Therapeutic activities;Therapeutic exercise;Balance training;Neuromuscular re-education;Manual techniques;Stair training;Gait training;Dry needling;Passive range of motion;Taping;Patient/family education    PT Next Visit Plan f/u on MD appointment; manual to gastroc and possible d/n; progress isometrics ankle PF/toe flexion, strength-offloading achilles, vaso if needed    PT Home Exercise Plan Access Code: [email or web address]   Consulted and Agree with  Plan of Care Patient           Patient  will benefit from skilled therapeutic intervention in order to improve the following deficits and impairments:  Abnormal gait, Decreased balance  Visit Diagnosis: Pain in left foot  Difficulty in walking, not elsewhere classified     Problem List Patient Active Problem List   Diagnosis Date Noted  . Achilles tendinitis 03/11/2020  . Ovarian cyst 02/19/2020  . Deliberate self-cutting 01/23/2020  . Non-suicidal self harm as coping mechanism ([place]) 12/25/2019  . Diarrhea 06/13/2018  . Hypomagnesemia 06/13/2018  . Transaminitis 06/13/2018  . C. difficile diarrhea 06/12/2018  . Oral thrush 06/12/2018  . Suicidal ideation 04/12/2018  . Chest pain 03/28/2018  . Obesity 03/28/2018  . OSA (obstructive sleep apnea) 03/28/2018  . Acute cystitis 03/10/2018  . GERD (gastroesophageal reflux disease) 03/10/2018  . Depression with suicidal ideation 03/09/2018  . PTSD (post-traumatic stress disorder) 03/09/2018  . Major depressive disorder, recurrent severe without psychotic features ([place]) 02/27/2018  . Polycystic disease, ovaries 07/05/2016  . Abdominal pain, suprapubic 06/04/2016  . Spondylisthesis 04/20/2015  . Herniated lumbar intervertebral disc 02/28/2013  . Sciatica of left side 02/28/2013  . Borderline personality disorder ([place]) 12/29/2012  . Anxiety 12/24/2012  . COPD (chronic obstructive pulmonary disease) ([place]) 12/24/2012  . Essential hypertension 12/24/2012  . Low back pain 07/22/2012   10:19 AM,05/31/20 Sherol Dade PT, DPT [facility] [address] [place], [place], [address] Phone: [phone]   Fax:  [phone]  Name: Rebekah Davis MRN: [id number] Date of Birth: [birth year]

## 2020-06-09 ENCOUNTER — Encounter: Payer: Self-pay | Admitting: Nurse Practitioner

## 2020-06-09 ENCOUNTER — Ambulatory Visit (INDEPENDENT_AMBULATORY_CARE_PROVIDER_SITE_OTHER): Payer: Medicare Other | Admitting: Nurse Practitioner

## 2020-06-09 ENCOUNTER — Other Ambulatory Visit: Payer: Self-pay

## 2020-06-09 VITALS — BP 115/58 | HR 105 | Temp 98.4°F | Ht 65.5 in

## 2020-06-09 DIAGNOSIS — F332 Major depressive disorder, recurrent severe without psychotic features: Secondary | ICD-10-CM

## 2020-06-09 DIAGNOSIS — Z6841 Body Mass Index (BMI) 40.0 and over, adult: Secondary | ICD-10-CM

## 2020-06-09 DIAGNOSIS — M7662 Achilles tendinitis, left leg: Secondary | ICD-10-CM | POA: Diagnosis not present

## 2020-06-09 MED ORDER — ESCITALOPRAM OXALATE 5 MG PO TABS: 5.0000 mg | ORAL_TABLET | Freq: Every day | ORAL | 2 refills | Status: DC

## 2020-06-09 MED ORDER — ESCITALOPRAM OXALATE 10 MG PO TABS: 10.0000 mg | ORAL_TABLET | Freq: Every day | ORAL | 2 refills | Status: DC

## 2020-06-09 MED ORDER — ARIPIPRAZOLE 20 MG PO TABS: 20.0000 mg | ORAL_TABLET | Freq: Every day | ORAL | 2 refills | Status: DC

## 2020-06-09 NOTE — Progress Notes (Signed)
[facility] Patient [facility] [address] [place], [place]  [address] Phone:  [phone]   Fax:  [phone]    Established Patient Office Visit  Subjective:  Patient ID: Rebekah Davis, female    DOB: [birth year]  Age: 34 y.o. MRN: [id number]  CC:  Chief Complaint  Patient presents with   Follow-up    HPI Rebekah Davis presents for follow up. She  has a past medical history of Arthritis, Kidney stones, Migraine, and PCOS (polycystic ovarian syndrome).   She is seeing a Facilities manager in [place] Depression Patient complains of depression. She complains of depressed mood. Onset was approximately >15 yrs  ago. Symptoms have been unchanged since that time. Current symptoms include: weight gain and anxiety, and desire to harm with cutting at times but denies desire to die!. Patient denies hopelessness, hypersomnia, psychomotor agitation, psychomotor retardation, recurrent thoughts of death, suicidal thoughts with specific plan and weight loss. Family history significant for alcoholism, anxiety, depression and substance abuse. Possible organic causes contributing are: neuro. Risk factors: positive family history in  mother. Previous treatment includes group therapy, individual therapy and medication. She complains of the following side effects from the treatment: none.   She was in PT sen by [facility].   Past Medical History:  Diagnosis Date   Arthritis    Kidney stones    Migraine    PCOS (polycystic ovarian syndrome)     History reviewed. No pertinent surgical history.  Family History  Problem Relation Age of Onset   Hypertension Mother    Diabetes Mother    Kidney disease Mother    Depression Mother    Hypertension Father    Diabetes Father     Social History   Socioeconomic History   Marital status: Single    Spouse name: Not on file   Number of children: Not on file   Years of education: Not on file   Highest  education level: Not on file  Occupational History   Not on file  Tobacco Use   Smoking status: Former Smoker   Smokeless tobacco: Never Used  Substance and Sexual Activity   Alcohol use: Not Currently   Drug use: Not Currently   Sexual activity: Not on file  Other Topics Concern   Not on file  Social History Narrative   Not on file   Social Determinants of Health   Financial Resource Strain:    Difficulty of Paying Living Expenses: Not on file  Food Insecurity:    Worried About Running Out of Food in the Last Year: Not on file   Ran Out of Food in the Last Year: Not on file  Transportation Needs:    Lack of Transportation (Medical): Not on file   Lack of Transportation (Non-Medical): Not on file  Physical Activity:    Days of Exercise per Week: Not on file   Minutes of Exercise per Session: Not on file  Stress:    Feeling of Stress : Not on file  Social Connections:    Frequency of Communication with Friends and Family: Not on file   Frequency of Social Gatherings with Friends and Family: Not on file   Attends Religious Services: Not on file   Active Member of Clubs or Organizations: Not on file   Attends Banker Meetings: Not on file   Marital Status: Not on file  Intimate Partner Violence:    Fear of Current or Ex-Partner: Not on file  Emotionally Abused: Not on file   Physically Abused: Not on file   Sexually Abused: Not on file    Outpatient Medications Prior to Visit  Medication Sig Dispense Refill   acetaminophen (TYLENOL) 325 MG tablet Take 650 mg by mouth daily as needed for pain.     meloxicam (MOBIC) 15 MG tablet Take 1 tablet (15 mg total) by mouth daily. 30 tablet 0   ARIPiprazole (ABILIFY) 20 MG tablet Take 1 tablet (20 mg total) by mouth daily. 30 tablet 2   escitalopram (LEXAPRO) 10 MG tablet Take 1 tablet (10 mg total) by mouth daily. (Patient taking differently: Take 10 mg by mouth daily. Taking with 5mg  =  15mg  total daily) 30 tablet 2   escitalopram (LEXAPRO) 5 MG tablet Take 1 tablet (5 mg total) by mouth daily. (Patient taking differently: Take 5 mg by mouth daily. Taking with 10mg  = 15mg  daily) 30 tablet 2   doxycycline (VIBRA-TABS) 100 MG tablet Take 1 tablet (100 mg total) by mouth 2 (two) times daily. (Patient not taking: Reported on 06/09/2020) 14 tablet 0   No facility-administered medications prior to visit.    Allergies  Allergen Reactions   Adhesive [Tape] Other (See Comments)    blister   Cherry Anaphylaxis and Rash   Other Hives and Rash   Sulfamethoxazole-Trimethoprim Hives   Levofloxacin     Other reaction(s): Muscle Pain, Other (See Comments)   Morphine     Other reaction(s): Other (See Comments) Feels like unable to breath or swallow    Gabapentin Rash   Metformin Nausea And Vomiting    Other reaction(s): Other (See Comments) diaphoresis    Sumatriptan Nausea And Vomiting    Other reaction(s): Other (See Comments) Decreased heart rate and respiratory rate Decreased heart rate and respiratory rate     ROS Review of Systems  HENT: Negative.   Respiratory: Negative.   Cardiovascular: Negative.   Gastrointestinal: Negative.   Endocrine: Negative.   Genitourinary: Negative.   Musculoskeletal: Positive for joint swelling.       Left ankle currently in tx  Allergic/Immunologic: Negative.   Neurological: Positive for dizziness.       X1 episode   Hematological: Negative.   Psychiatric/Behavioral:       Anxiety She does have feelings of wanting to cut. She does not have any desire to die. She does have some depression; however does not want to change medications. She understand that it is situational       Objective:    Physical Exam Constitutional:      Appearance: She is obese.  HENT:     Head: Normocephalic and atraumatic.     Nose: Nose normal.     Mouth/Throat:     Mouth: Mucous membranes are moist.  Eyes:     Comments: Wearing  glasses   Cardiovascular:     Rate and Rhythm: Normal rate and regular rhythm.     Pulses: Normal pulses.     Heart sounds: Normal heart sounds.  Pulmonary:     Effort: Pulmonary effort is normal.     Breath sounds: Normal breath sounds.  Abdominal:     Palpations: Abdomen is soft.  Musculoskeletal:     Cervical back: Normal range of motion.     Comments: Boot worn  Skin:    General: Skin is warm and dry.     Capillary Refill: Capillary refill takes less than 2 seconds.  Neurological:     General: No focal deficit  present.     Mental Status: She is alert and oriented to person, place, and time.  Psychiatric:        Mood and Affect: Mood normal.        Behavior: Behavior normal.        Thought Content: Thought content normal.        Judgment: Judgment normal.     BP (!) 115/58    Pulse (!) 105    Temp 98.4 F (36.9 C) (Temporal)    Ht 5' 5.5" (1.664 m)    SpO2 100%    BMI 47.36 kg/m  Wt Readings from Last 3 Encounters:  03/11/20 289 lb (131.1 kg)  02/09/20 268 lb (121.6 kg)     Health Maintenance Due  Topic Date Due   Hepatitis C Screening  Never done   COVID-19 Vaccine (1) Never done   PAP SMEAR-Modifier  Never done   INFLUENZA VACCINE  05/15/2020    There are no preventive care reminders to display for this patient.  Lab Results  Component Value Date   TSH 1.380 03/11/2020   Lab Results  Component Value Date   WBC 10.5 03/25/2020   HGB 14.9 03/25/2020   HCT 45.6 03/25/2020   MCV 95.8 03/25/2020   PLT 246 03/25/2020   Lab Results  Component Value Date   NA 144 03/25/2020   K 4.2 03/25/2020   CO2 28 03/25/2020   GLUCOSE 101 (H) 03/25/2020   BUN 17 03/25/2020   CREATININE 0.95 03/25/2020   BILITOT 0.3 03/11/2020   ALKPHOS 77 03/11/2020   AST 24 03/11/2020   ALT 22 02/09/2020   PROT 6.4 03/11/2020   ALBUMIN 4.3 03/11/2020   CALCIUM 9.1 03/25/2020   ANIONGAP 10 03/25/2020   No results found for: CHOL No results found for: HDL No results  found for: LDLCALC No results found for: TRIG No results found for: CHOLHDL No results found for: [id number]    Assessment & Plan:   Problem List Items Addressed This Visit      Musculoskeletal and Integument   Achilles tendinitis Continue to follow up with ortho PT discontinued for now due to continued increased pain     Other   Obesity Obesity with BMI as noted above.  Discussed proper diet (low fat, low sodium, high fiber) with patient.   iscussed need for regular exercise (3 times per week, 20 minutes per session) with patient.    Major depressive disorder, recurrent severe without psychotic features (HCC) - Primary   Continue with current regimen.  No changes warranted. Good patient compliance. Continue with non pharmocological coping mechanics .Relevant Medications   escitalopram (LEXAPRO) 5 MG tablet   escitalopram (LEXAPRO) 10 MG tablet      Meds ordered this encounter  Medications   escitalopram (LEXAPRO) 5 MG tablet    Sig: Take 1 tablet (5 mg total) by mouth daily. Taking with 10mg  = 15mg  daily    Dispense:  30 tablet    Refill:  2    Order Specific Question:   Supervising Provider    Answer:   Quentin AngstJEGEDE, OLUGBEMIGA E [[id number]]   escitalopram (LEXAPRO) 10 MG tablet    Sig: Take 1 tablet (10 mg total) by mouth daily. Taking with 5mg  = 15mg  total daily    Dispense:  30 tablet    Refill:  2    Order Specific Question:   Supervising Provider    Answer:   Quentin AngstJEGEDE, OLUGBEMIGA E [[id number]]   ARIPiprazole (ABILIFY)  20 MG tablet    Sig: Take 1 tablet (20 mg total) by mouth daily.    Dispense:  30 tablet    Refill:  2    Order Specific Question:   Supervising Provider    Answer:   Quentin Angst [id number]    Follow-up: Return in about 3 months (around 09/09/2020).    Barbette Merino, NP

## 2020-06-09 NOTE — Patient Instructions (Signed)
Managing Bipolar Disorder When someone is diagnosed with bipolar disorder, the person may be relieved to now know why he or she has felt or behaved a certain way. The person may also feel overwhelmed about the treatment ahead, how to get needed support, and how to deal with the condition each day. With care and support, a person with bipolar disorder can learn to manage his or her symptoms and live with the condition. How to manage lifestyle changes Managing stress Stress is your body's reaction to life changes and events, both good and bad. Stress can play a major role in bipolar disorder, so it is important to learn how to manage stress. Some techniques to help you manage stress include:  Meditation, muscle relaxation, and breathing exercises.  Exercise. Even a short daily walk can help to lower stress levels.  Getting enough good-quality sleep. Too little sleep can cause mania to start (can trigger mania).  Making a schedule to manage your time. Knowing your daily schedule can help to keep you from feeling overwhelmed by tasks and deadlines.  Spending time on hobbies you enjoy.  Medicines Your health care provider may suggest certain medicines if he or she feels that they will help improve your condition. Avoid using caffeine, alcohol, and other substances that may prevent your medicines from working properly. It is also important to:  Talk with your pharmacist or health care provider about all the medicines that you take, their possible side effects, and which medicines are safe to take together.  Make it your goal to take part in all treatment decisions (shared decision-making). Ask about possible side effects of medicines that your health care provider recommends, and tell him or her how you feel about having those side effects. It is best if shared decision-making with your health care provider is part of your total treatment plan. If you are taking medicines as part of your treatment,  do not stop taking medicines before you ask your health care provider if it is safe to stop. You may need to have the medicine slowly decreased (tapered) over time to lower the risk of harmful side effects. Relationships Spend time with people whom you trust and with whom you feel a sense of understanding and calm. Try to find friends or family members who make you feel safe and can help you control feelings of mania. Consider going to couples counseling, family education classes, or family therapy to:  Educate your loved ones about your condition and offer suggestions about how they can support you.  Help resolve conflicts.  Help develop communication skills in your relationships.  How to recognize changes in your condition Everyone responds differently to treatment for bipolar disorder. Some signs that your condition is improving include:  Leveling of your mood. You may have less anger and excitement about daily activities, and your low moods may not be as bad.  Your symptoms being less intense.  Feeling calm more often.  Thinking clearly.  Not experiencing consequences for extreme behavior.  Feeling like your life is settling down.  Your behavior seeming more normal to you and to other people. Some signs that your condition may be getting worse include:  Sleep problems.  Moods cycling between deep lows and unusually high (excess) energy.  Extreme emotions.  More anger at loved ones.  Staying away from others, or isolating yourself.  A feeling of power or superiority.  Completing a lot of tasks in a very short amount of time.  Unusual thoughts  and behaviors.  Suicidal thoughts. Follow these instructions at home: Medicines  Take over-the-counter and prescription medicines only as told by your health care provider or pharmacist.  Ask your pharmacist what over-the-counter cold medicines you should avoid. Some medicines can make symptoms worse. General  instructions  Ask for support from trusted family members or friends to make sure you stay on track with your treatment.  Keep a journal to write down your daily moods, medicines, sleep habits, and life events. This may help you have more success with your treatment.  Make and follow a routine for daily meal times. Eat healthy foods, such as whole grains, vegetables, and fresh fruit.  Try to go to sleep and wake up around the same time every day.  Keep all follow-up visits as told by your health care provider. This is important. Where to find support Talking to others  Try making a list of the people you may want to tell about your condition, such as the people you trust most.  Plan what you are willing and not willing to talk about. Think about your needs ahead of time, and how your friends and family members can support you.  Let your loved ones know when they can share advice and when you would just like them to listen.  Give your loved ones information about bipolar disorder, and encourage them to learn about the condition. Finances Not all insurance plans cover mental health care, so it is important to check with your insurance carrier. If paying for co-pays or counseling services is a problem, search for a local or county mental health care center. Public mental health care services may be offered there at a low cost or no cost when you are not able to see a private health care provider. If you are taking medicine for depression, you may be able to get the generic form, which may be less expensive than brand-name medicine. Some makers of prescription medicines also offer help to patients who cannot afford the medicines they need. Questions to ask your health care provider:  If you are taking medicines: ? How long do I need to take medicine? ? Are there any long-term side effects of my medicine? ? Are there any alternatives to taking medicine?  How would I benefit from  therapy?  How often should I follow up with a health care provider? Contact a health care provider if:  Your symptoms get worse or they do not get better with treatment. Get help right away if:  You have thoughts about harming yourself or others. If you ever feel like you may hurt yourself or others, or have thoughts about taking your own life, get help right away. You can go to your nearest emergency department or call:  Your local emergency services (911 in the [place]  A suicide crisis helpline, such as the [place] at 762-557-2487. This is open 24-hours a day. Summary  Learning ways to manage stress can help to calm you and may also help your treatment work better.  There is a wide range of medicines that can help to treat bipolar disorder.  Having healthy relationships can help to make your moods more stable.  Contact a health care provider if your symptoms get worse or they do not get better with treatment. This information is not intended to replace advice given to you by your health care provider. Make sure you discuss any questions you have with your health care provider. Document Revised:  01/23/2019 Document Reviewed: 01/31/2017 Elsevier Patient Education  [place] Following a healthy eating pattern may help you to achieve and maintain a healthy body weight, reduce the risk of chronic disease, and live a long and productive life. It is important to follow a healthy eating pattern at an appropriate calorie level for your body. Your nutritional needs should be met primarily through food by choosing a variety of nutrient-rich foods. What are tips for following this plan? Reading food labels  Read labels and choose the following: ? Reduced or low sodium. ? Juices with 100% fruit juice. ? Foods with low saturated fats and high polyunsaturated and monounsaturated fats. ? Foods with whole grains, such as whole wheat, cracked  wheat, brown rice, and wild rice. ? Whole grains that are fortified with folic acid. This is recommended for women who are pregnant or who want to become pregnant.  Read labels and avoid the following: ? Foods with a lot of added sugars. These include foods that contain brown sugar, corn sweetener, corn syrup, dextrose, fructose, glucose, high-fructose corn syrup, honey, invert sugar, lactose, malt syrup, maltose, molasses, raw sugar, sucrose, trehalose, or turbinado sugar.  Do not eat more than the following amounts of added sugar per day:  6 teaspoons (25 g) for women.  9 teaspoons (38 g) for men. ? Foods that contain processed or refined starches and grains. ? Refined grain products, such as white flour, degermed cornmeal, white bread, and white rice. Shopping  Choose nutrient-rich snacks, such as vegetables, whole fruits, and nuts. Avoid high-calorie and high-sugar snacks, such as potato chips, fruit snacks, and candy.  Use oil-based dressings and spreads on foods instead of solid fats such as butter, stick margarine, or cream cheese.  Limit pre-made sauces, mixes, and "instant" products such as flavored rice, instant noodles, and ready-made pasta.  Try more plant-protein sources, such as tofu, tempeh, black beans, edamame, lentils, nuts, and seeds.  Explore eating plans such as the Mediterranean diet or vegetarian diet. Cooking  Use oil to saut or stir-fry foods instead of solid fats such as butter, stick margarine, or lard.  Try baking, boiling, grilling, or broiling instead of frying.  Remove the fatty part of meats before cooking.  Steam vegetables in water or broth. Meal planning   At meals, imagine dividing your plate into fourths: ? One-half of your plate is fruits and vegetables. ? One-fourth of your plate is whole grains. ? One-fourth of your plate is protein, especially lean meats, poultry, eggs, tofu, beans, or nuts.  Include low-fat dairy as part of your  daily diet. Lifestyle  Choose healthy options in all settings, including home, work, school, restaurants, or stores.  Prepare your food safely: ? Wash your hands after handling raw meats. ? Keep food preparation surfaces clean by regularly washing with hot, soapy water. ? Keep raw meats separate from ready-to-eat foods, such as fruits and vegetables. ? Cook seafood, meat, poultry, and eggs to the recommended internal temperature. ? Store foods at safe temperatures. In general:  Keep cold foods at 73F (4.4C) or below.  Keep hot foods at 173F (60C) or above.  Keep your freezer at [facility] (-17.8C) or below.  Foods are no longer safe to eat when they have been between the temperatures of 40-173F (4.4-60C) for more than 2 hours. What foods should I eat? Fruits Aim to eat 2 cup-equivalents of fresh, canned (in natural juice), or frozen fruits each day. Examples of 1 cup-equivalent of fruit include  1 small apple, 8 large strawberries, 1 cup canned fruit,  cup dried fruit, or 1 cup 100% juice. Vegetables Aim to eat 2-3 cup-equivalents of fresh and frozen vegetables each day, including different varieties and colors. Examples of 1 cup-equivalent of vegetables include 2 medium carrots, 2 cups raw, leafy greens, 1 cup chopped vegetable (raw or cooked), or 1 medium baked potato. Grains Aim to eat 6 ounce-equivalents of whole grains each day. Examples of 1 ounce-equivalent of grains include 1 slice of bread, 1 cup ready-to-eat cereal, 3 cups popcorn, or  cup cooked rice, pasta, or cereal. Meats and other proteins Aim to eat 5-6 ounce-equivalents of protein each day. Examples of 1 ounce-equivalent of protein include 1 egg, 1/2 cup nuts or seeds, or 1 tablespoon (16 g) peanut butter. A cut of meat or fish that is the size of a deck of cards is about 3-4 ounce-equivalents.  Of the protein you eat each week, try to have at least 8 ounces come from seafood. This includes salmon, trout, herring, and  anchovies. Dairy Aim to eat 3 cup-equivalents of fat-free or low-fat dairy each day. Examples of 1 cup-equivalent of dairy include 1 cup (240 mL) milk, 8 ounces (250 g) yogurt, 1 ounces (44 g) natural cheese, or 1 cup (240 mL) fortified soy milk. Fats and oils  Aim for about 5 teaspoons (21 g) per day. Choose monounsaturated fats, such as canola and olive oils, avocados, peanut butter, and most nuts, or polyunsaturated fats, such as sunflower, corn, and soybean oils, walnuts, pine nuts, sesame seeds, sunflower seeds, and flaxseed. Beverages  Aim for six 8-oz glasses of water per day. Limit coffee to three to five 8-oz cups per day.  Limit caffeinated beverages that have added calories, such as soda and energy drinks.  Limit alcohol intake to no more than 1 drink a day for nonpregnant women and 2 drinks a day for men. One drink equals 12 oz of beer (355 mL), 5 oz of wine (148 mL), or 1 oz of hard liquor (44 mL). Seasoning and other foods  Avoid adding excess amounts of salt to your foods. Try flavoring foods with herbs and spices instead of salt.  Avoid adding sugar to foods.  Try using oil-based dressings, sauces, and spreads instead of solid fats. This information is based on general [place] nutrition guidelines. For more information, visit [employer]. Exact amounts may vary based on your nutrition needs. Summary  A healthy eating plan may help you to maintain a healthy weight, reduce the risk of chronic diseases, and stay active throughout your life.  Plan your meals. Make sure you eat the right portions of a variety of nutrient-rich foods.  Try baking, boiling, grilling, or broiling instead of frying.  Choose healthy options in all settings, including home, work, school, restaurants, or stores. This information is not intended to replace advice given to you by your health care provider. Make sure you discuss any questions you have with your health care provider. Document  Revised: 01/13/2018 Document Reviewed: 01/13/2018 Elsevier Patient Education  [place].

## 2020-06-16 ENCOUNTER — Ambulatory Visit: Payer: Medicare Other | Admitting: Physical Therapy

## 2020-06-18 ENCOUNTER — Other Ambulatory Visit: Payer: Self-pay | Admitting: Nurse Practitioner

## 2020-06-22 ENCOUNTER — Encounter: Payer: Medicare Other | Admitting: Physical Therapy

## 2020-06-30 ENCOUNTER — Encounter: Payer: Medicare Other | Admitting: Physical Therapy

## 2020-07-04 ENCOUNTER — Other Ambulatory Visit: Payer: Self-pay | Admitting: Nurse Practitioner

## 2020-07-04 ENCOUNTER — Ambulatory Visit: Payer: Medicare Other | Admitting: Physical Therapy

## 2020-07-07 ENCOUNTER — Other Ambulatory Visit: Payer: Self-pay

## 2020-07-07 ENCOUNTER — Ambulatory Visit: Payer: Medicare Other | Attending: Orthopaedic Surgery | Admitting: Physical Therapy

## 2020-07-07 ENCOUNTER — Encounter: Payer: Medicare Other | Admitting: Physical Therapy

## 2020-07-07 ENCOUNTER — Encounter: Payer: Self-pay | Admitting: Physical Therapy

## 2020-07-07 DIAGNOSIS — M79672 Pain in left foot: Secondary | ICD-10-CM | POA: Insufficient documentation

## 2020-07-07 DIAGNOSIS — R262 Difficulty in walking, not elsewhere classified: Secondary | ICD-10-CM | POA: Diagnosis present

## 2020-07-07 NOTE — Therapy (Signed)
[facility] [address] [place], [place], [address] Phone: [phone]   Fax:  [phone]  Physical Therapy Evaluation  Patient Details  Name: Rebekah Davis MRN: [id number] Date of Birth: [birth year] Referring Provider (PT): Dr. Nicki Guadalajara   Encounter Date: 07/07/2020   PT End of Session - 07/07/20 1521    Visit Number 1    Date for PT Re-Evaluation 09/29/20    Authorization Type Medicare/medicaid    Authorization - Visit Number 1    Authorization - Number of Visits 10    PT Start Time 1445    PT Stop Time 1523    PT Time Calculation (min) 38 min    Activity Tolerance No increased pain;Patient tolerated treatment well    Behavior During Therapy [facility] for tasks assessed/performed           Past Medical History:  Diagnosis Date  . Arthritis   . Kidney stones   . Migraine   . PCOS (polycystic ovarian syndrome)     History reviewed. No pertinent surgical history.  There were no vitals filed for this visit.    Subjective Assessment - 07/07/20 1452    Subjective The first doctor wanted me to have surgery. I saw another doctor for another opion. I was placed on a Predisone dose pack and stopped PT. Now the new doctor wants me to start therapy. Patient is wearing a CAM boot for the past 4 weeks.    Limitations Walking;Standing    Patient Stated Goals not have surgery and decrease pain    Currently in Pain? Yes    Pain Score 3    7/10 on high   Pain Location Head    Pain Orientation Right;Posterior    Pain Descriptors / Indicators Burning    Pain Type Chronic pain    Pain Onset More than a month ago    Aggravating Factors  turn left foot inward and down, walking, standing for 30 minutes, stairs    Pain Relieving Factors ice, tylenol, elevation, wearing the boot    Multiple Pain Sites No              [facility] PT Assessment - 07/07/20 0001      Assessment   Medical Diagnosis Left achilles tendonitis     Referring Provider (PT) Dr. Nicki Guadalajara    Onset Date/Surgical Date 03/16/19    Prior Therapy yes      Precautions   Precautions None      Restrictions   Weight Bearing Restrictions No      Balance Screen   Has the patient fallen in the past 6 months No    Has the patient had a decrease in activity level because of a fear of falling?  No    Is the patient reluctant to leave their home because of a fear of falling?  No      Home Environment   Living Environment Private residence    Type of Home House    Home Access Stairs to enter    Entrance Stairs-Number of Steps 4      Prior Function   Level of Independence Independent      Cognition   Overall Cognitive Status Within Functional Limits for tasks assessed      Observation/Other Assessments   Focus on Therapeutic Outcomes (FOTO)  59% limitation; goal is 46% limitation      Posture/Postural Control   Posture/Postural Control Postural limitations    Postural  Limitations Rounded Shoulders;Forward head      ROM / Strength   AROM / PROM / Strength AROM;PROM;Strength      AROM   Left Ankle Dorsiflexion 10    Left Ankle Plantar Flexion 40    Left Ankle Inversion 30    Left Ankle Eversion 5      Strength   Left Ankle Dorsiflexion 5/5    Left Ankle Plantar Flexion --   tried with manual resistance but painful   Left Ankle Inversion 4/5   increased calf pain   Left Ankle Eversion 4/5      Palpation   Palpation comment thickness palpated in medial soleus and achilles tendon   pain on medial achilles tendon on the calcaneus                     Objective measurements completed on examination: See above findings.       [facility] Adult PT Treatment/Exercise - 07/07/20 0001      Self-Care   Self-Care Other Self-Care Comments    Other Self-Care Comments  verbally instructed patient on how to wean herself off the CAM boot      Manual Therapy   Manual Therapy Soft tissue mobilization    Soft tissue mobilization  using the addaday to the left gastroc and medial soleus with less pain afterwards                  PT Education - 07/07/20 1531    Education Details instructed patient on how to wean herself off the CAM boot    Person(s) Educated Patient    Methods Explanation    Comprehension Verbalized understanding            PT Short Term Goals - 07/07/20 1511      PT SHORT TERM GOAL #1   Title Pt will be independent with her initial HEP to increase ankle flexibility and decrease pain.    Time 4    Period Weeks    Status New    Target Date 08/04/20             PT Long Term Goals - 07/07/20 1512      PT LONG TERM GOAL #1   Title Pt will have atleast 15 deg of passive Lt ankle dorsiflexion to improve foot clearance during ambulation.    Baseline ---    Time 12    Period Weeks    Status New    Target Date 09/29/20      PT LONG TERM GOAL #2   Title Pt will be able to ambulate with adequate push off and step length x100 secondary to improved pain and gastroc strength.    Time 12    Period Weeks    Status New    Target Date 09/29/20      PT LONG TERM GOAL #3   Title Pt will be able to complete atleast 10 reps of single leg heel raise with or without pain to reflect improvements in gastroc strength.    Baseline --    Time 12    Period Weeks    Status New    Target Date 09/29/20      PT LONG TERM GOAL #4   Title Pt will be able to maintain single leg stance on the Lt for atleast 10 seconds, 2/3 trials, to reflect increase in ankle proprioception.    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 Patient is now seeing a new doctor that started  her on a predisone pack and had her in a CAM boot for 4 weeks. Patient is now ready to start therapy and wean off the CAM Boot. Patient reports her pain level is 3-7/10 when she walks, stands greater than 30 minutes, and turn left ankle inward and down. Left ankle strength is 4/5 with exception of plantarflexion. Plantarflexion is not able to be assessed due to pain on resistance. Patient has palpable tenderness located on the medial achilles tendon on the calcaneus. She has thickness on the medial gastroc and soleus. Patient was instructed on weaning herself of the CAM boot by trying 1 hour per day and progress as she feels. Patient will benefit from skilled therapy to improve tissue mobilization, strength and function of left achilles tendon.    Personal Factors and Comorbidities Time since onset of injury/illness/exacerbation;Fitness    Examination-Activity Limitations Stairs;Stand;Locomotion Level    Examination-Participation Artist;Yard Work    Stability/Clinical Decision Making Stable/Uncomplicated    Clinical Decision Making Moderate    Rehab Potential Good    PT Frequency 2x / week    PT Duration 12 weeks    PT Treatment/Interventions ADLs/Self Care Home Management;Aquatic Therapy;Cryotherapy;Iontophoresis 4mg /ml Dexamethasone;Therapeutic activities;Therapeutic exercise;Balance training;Neuromuscular re-education;Manual techniques;Stair training;Gait training;Dry needling;Passive range of motion;Taping;Patient/family education    PT Next Visit Plan manual work to the left gastroc and achilles tendon, left ankle A/AROM; see how it is going with weaning off the boot; dry needling to calf/soleus, left ankle isometrics    PT Home Exercise Plan Access Code: [email or web address]    Consulted and Agree with Plan of Care Patient           Patient will benefit from skilled therapeutic intervention in order to improve the following deficits and impairments:  Abnormal gait, Decreased balance,  Difficulty walking, Increased fascial restricitons, Pain, Decreased activity tolerance, Increased muscle spasms, Decreased strength, Decreased mobility  Visit Diagnosis: Pain in left foot - Plan: PT plan of care cert/re-cert  Difficulty in walking, not elsewhere classified - Plan: PT plan of care cert/re-cert     Problem List Patient Active Problem List   Diagnosis Date Noted  . Achilles tendinitis 03/11/2020  . Ovarian cyst 02/19/2020  . Deliberate self-cutting 01/23/2020  . Non-suicidal self harm as coping mechanism (HCC) 12/25/2019  . Diarrhea 06/13/2018  . Hypomagnesemia 06/13/2018  . Transaminitis 06/13/2018  . C. difficile diarrhea 06/12/2018  . Oral thrush 06/12/2018  . Suicidal ideation 04/12/2018  . Chest pain 03/28/2018  . Obesity 03/28/2018  . OSA (obstructive sleep apnea) 03/28/2018  . Acute cystitis 03/10/2018  . GERD (gastroesophageal reflux disease) 03/10/2018  . Depression with suicidal ideation 03/09/2018  . PTSD (post-traumatic stress disorder) 03/09/2018  . 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 Polycystic disease, ovaries 07/05/2016  . Abdominal pain, suprapubic 06/04/2016  . Spondylisthesis 04/20/2015  . Herniated lumbar intervertebral disc 02/28/2013  .  Sciatica of left side 02/28/2013  . Borderline personality disorder (HCC) 12/29/2012  . Anxiety 12/24/2012  . COPD (chronic obstructive pulmonary disease) (HCC) 12/24/2012  . Essential hypertension 12/24/2012  . Low back pain 07/22/2012    Eulis Foster, PT 07/07/20 3:33 PM   [facility] [address], [address] [place], [place], [address] Phone: [phone]   Fax:  [phone]  Name: Rebekah Davis MRN: [id number] Date of Birth: 10-05-1986

## 2020-07-13 ENCOUNTER — Ambulatory Visit: Payer: Medicare Other | Admitting: Physical Therapy

## 2020-07-13 ENCOUNTER — Encounter: Payer: Self-pay | Admitting: Physical Therapy

## 2020-07-13 ENCOUNTER — Other Ambulatory Visit: Payer: Self-pay

## 2020-07-13 ENCOUNTER — Encounter: Payer: Medicare Other | Admitting: Physical Therapy

## 2020-07-13 DIAGNOSIS — M79672 Pain in left foot: Secondary | ICD-10-CM | POA: Diagnosis not present

## 2020-07-13 DIAGNOSIS — R262 Difficulty in walking, not elsewhere classified: Secondary | ICD-10-CM

## 2020-07-13 NOTE — Patient Instructions (Signed)
Access Code: [email or web address] URL: [email or web address] Date: 07/13/2020 Prepared by: Dwana Curd  Exercises Long Sitting Calf Stretch with Strap - 5 x daily - 7 x weekly - 1 reps - 10 seconds hold Seated Ankle Inversion Eversion PROM - 3 x daily - 7 x weekly - 1 sets - 10 reps Seated Heel Slide - 1-2 x daily - 7 x weekly - 10 reps Seated Lesser Toes Extension - 1 x daily - 7 x weekly - 2 sets - 10 reps Ankle and Toe Plantarflexion with Resistance - 1 x daily - 7 x weekly - 2 sets - 10 reps Ankle Dorsiflexion with Resistance - 1 x daily - 7 x weekly - 3 sets - 10 reps Isometric Ankle Inversion - 1 x daily - 7 x weekly - 3 sets - 10 reps Isometric Ankle Eversion at Wall - 1 x daily - 7 x weekly - 3 sets - 10 reps Seated Ankle Eversion with Resistance - 1 x daily - 7 x weekly - 3 sets - 10 reps

## 2020-07-13 NOTE — Therapy (Signed)
[facility] [address], [address] [place], [place], [address] Phone: [phone]   Fax:  [phone]  Physical Therapy Treatment  Patient Details  Name: Rebekah Davis MRN: [id number] Date of Birth: September 07, 1986 Referring Provider (PT): Dr. Nicki Guadalajara   Encounter Date: 07/13/2020   PT End of Session - 07/13/20 1014    Visit Number 2    Date for PT Re-Evaluation 09/29/20    Authorization Type Medicare/medicaid    Authorization Time Period 05/28/20 to 07/21/20    PT Start Time 1015    PT Stop Time 1110    PT Time Calculation (min) 55 min    Activity Tolerance No increased pain;Patient tolerated treatment well    Behavior During Therapy [facility] for tasks assessed/performed           Past Medical History:  Diagnosis Date  . Arthritis   . Kidney stones   . Migraine   . PCOS (polycystic ovarian syndrome)     History reviewed. No pertinent surgical history.  There were no vitals filed for this visit.   Subjective Assessment - 07/13/20 1015    Subjective Pt states she is going 2 hours without the boot and it has felt the best it has felt since last June.  No tylenol other than one day    Limitations Walking;Standing    Patient Stated Goals not have surgery and decrease pain    Currently in Pain? Yes    Pain Score 3     Pain Location Ankle    Pain Orientation Right;Posterior    Pain Descriptors / Indicators Tightness    Pain Type Chronic pain    Pain Onset More than a month ago    Pain Frequency Constant    Multiple Pain Sites No              [facility] PT Assessment - 07/13/20 0001      Figure 8 Edema   Figure 8 - Right  53    Figure 8 - Left  55                         [facility] Adult PT Treatment/Exercise - 07/13/20 0001      Vasopneumatic   Number Minutes Vasopneumatic  15 minutes    Vasopnuematic Location  Ankle    Vasopneumatic Pressure Low    Vasopneumatic Temperature  3 flakes      Manual  Therapy   Soft tissue mobilization using the addaday to the left gastroc and medial soleus with less pain afterwards, trigger point release to gastroc, peroneals, plantar fascia    Myofascial Release superior achilles tendon attachments      Ankle Exercises: Supine   Isometrics inversion 10x5 sec    T-Band isometric eversion yellow, eccentric PF yellow- 10 each    Other Supine Ankle Exercises DF hold with SLR                  PT Education - 07/13/20 1101    Education Details Access Code: [email or web address]    Person(s) Educated Patient    Methods Explanation;Demonstration;Verbal cues;Handout;Tactile cues    Comprehension Verbalized understanding;Returned demonstration            PT Short Term Goals - 07/07/20 1511      PT SHORT TERM GOAL #1   Title Pt will be independent with her initial HEP to increase ankle flexibility and decrease pain.    Time 4  Period Weeks    Status New    Target Date 08/04/20             PT Long Term Goals - 07/07/20 1512      PT LONG TERM GOAL #1   Title Pt will have atleast 15 deg of passive Lt ankle dorsiflexion to improve foot clearance during ambulation.    Baseline ---    Time 12    Period Weeks    Status New    Target Date 09/29/20      PT LONG TERM GOAL #2   Title Pt will be able to ambulate with adequate push off and step length x100 secondary to improved pain and gastroc strength.    Time 12    Period Weeks    Status New    Target Date 09/29/20      PT LONG TERM GOAL #3   Title Pt will be able to complete atleast 10 reps of single leg heel raise with or without pain to reflect improvements in gastroc strength.    Baseline --    Time 12    Period Weeks    Status New    Target Date 09/29/20      PT LONG TERM GOAL #4   Title Pt will be able to maintain single leg stance on the Lt for atleast 10 seconds, 2/3 trials, to reflect increase in ankle proprioception.    Baseline ---    Time 12    Period Weeks    Status New     Target Date 09/29/20      PT LONG TERM GOAL #5   Title FOTO </=46%    Baseline --    Time 12    Period Weeks    Status New    Target Date 09/29/20                 Plan - 07/13/20 1049    Clinical Impression Statement Today's session focused on STM to Left gastorc, soleus, plantar fascia, peroneals.  Pt has some swelling noted in Lt ankle and responds well to ice.  Vaso added to treatment in POC.  Pt was able to add gentle strengthening with yellow band for ankle strength today.    PT Treatment/Interventions ADLs/Self Care Home Management;Aquatic Therapy;Cryotherapy;Iontophoresis 4mg /ml Dexamethasone;Therapeutic activities;Therapeutic exercise;Balance training;Neuromuscular re-education;Manual techniques;Stair training;Gait training;Dry needling;Passive range of motion;Taping;Patient/family education;Vasopneumatic Device    PT Home Exercise Plan Access Code: [email or web address]           Patient will benefit from skilled therapeutic intervention in order to improve the following deficits and impairments:  Abnormal gait, Decreased balance, Difficulty walking, Increased fascial restricitons, Pain, Decreased activity tolerance, Increased muscle spasms, Decreased strength, Decreased mobility, Increased edema  Visit Diagnosis: Pain in left foot  Difficulty in walking, not elsewhere classified     Problem List Patient Active Problem List   Diagnosis Date Noted  . Achilles tendinitis 03/11/2020  . Ovarian cyst 02/19/2020  . Deliberate self-cutting 01/23/2020  . Non-suicidal self harm as coping mechanism (HCC) 12/25/2019  . Diarrhea 06/13/2018  . Hypomagnesemia 06/13/2018  . Transaminitis 06/13/2018  . C. difficile diarrhea 06/12/2018  . Oral thrush 06/12/2018  . Suicidal ideation 04/12/2018  . Chest pain 03/28/2018  . Obesity 03/28/2018  . OSA (obstructive sleep apnea) 03/28/2018  . Acute cystitis 03/10/2018  . GERD (gastroesophageal reflux disease) 03/10/2018  . Depression with  suicidal ideation 03/09/2018  . PTSD (post-traumatic stress disorder) 03/09/2018  . Major depressive disorder, recurrent  severe without psychotic features (HCC) 02/27/2018  . Polycystic disease, ovaries 07/05/2016  . Abdominal pain, suprapubic 06/04/2016  . Spondylisthesis 04/20/2015  . Herniated lumbar intervertebral disc 02/28/2013  . Sciatica of left side 02/28/2013  . Borderline personality disorder (HCC) 12/29/2012  . Anxiety 12/24/2012  . COPD (chronic obstructive pulmonary disease) (HCC) 12/24/2012  . Essential hypertension 12/24/2012  . Low back pain 07/22/2012    Junious Silk, PT 07/13/2020, 11:09 AM  [facility] [address], [address] [place], [place], [address] Phone: [phone]   Fax:  [phone]  Name: Rebekah Davis MRN: [id number] Date of Birth: 1986/08/20

## 2020-07-19 ENCOUNTER — Other Ambulatory Visit: Payer: Self-pay

## 2020-07-19 ENCOUNTER — Other Ambulatory Visit: Payer: Self-pay | Admitting: Nurse Practitioner

## 2020-07-19 ENCOUNTER — Ambulatory Visit: Payer: Medicare Other | Attending: Orthopaedic Surgery | Admitting: Physical Therapy

## 2020-07-19 ENCOUNTER — Encounter: Payer: Self-pay | Admitting: Physical Therapy

## 2020-07-19 DIAGNOSIS — R262 Difficulty in walking, not elsewhere classified: Secondary | ICD-10-CM | POA: Diagnosis present

## 2020-07-19 DIAGNOSIS — M79672 Pain in left foot: Secondary | ICD-10-CM | POA: Insufficient documentation

## 2020-07-19 NOTE — Patient Instructions (Addendum)
Trigger Point Dry Needling  . What is Trigger Point Dry Needling (DN)? o DN is a physical therapy technique used to treat muscle pain and dysfunction. Specifically, DN helps deactivate muscle trigger points (muscle knots).  o A thin filiform needle is used to penetrate the skin and stimulate the underlying trigger point. The goal is for a local twitch response (LTR) to occur and for the trigger point to relax. No medication of any kind is injected during the procedure.   . What Does Trigger Point Dry Needling Feel Like?  o The procedure feels different for each individual patient. Some patients report that they do not actually feel the needle enter the skin and overall the process is not painful. Very mild bleeding may occur. However, many patients feel a deep cramping in the muscle in which the needle was inserted. This is the local twitch response.   Marland Kitchen How Will I feel after the treatment? o Soreness is normal, and the onset of soreness may not occur for a few hours. Typically this soreness does not last longer than two days.  o Bruising is uncommon, however; ice can be used to decrease any possible bruising.  o In rare cases feeling tired or nauseous after the treatment is normal. In addition, your symptoms may get worse before they get better, this period will typically not last longer than 24 hours.   . What Can I do After My Treatment? o Increase your hydration by drinking more water for the next 24 hours. o You may place ice or heat on the areas treated that have become sore, however, do not use heat on inflamed or bruised areas. Heat often brings more relief post needling. o You can continue your regular activities, but vigorous activity is not recommended initially after the treatment for 24 hours. o DN is best combined with other physical therapy such as strengthening, stretching, and other therapies.    [facility] [address] [place], [place]  [address] Phone # [phone] Fax [phone] Access Code: [email or web address] URL: [email or web address] Date: 07/19/2020 Prepared by: Dwana Curd  Exercises Long Sitting Calf Stretch with Strap - 5 x daily - 7 x weekly - 1 reps - 10 seconds hold Seated Ankle Inversion Eversion PROM - 3 x daily - 7 x weekly - 1 sets - 10 reps Seated Heel Slide - 1-2 x daily - 7 x weekly - 10 reps Seated Lesser Toes Extension - 1 x daily - 7 x weekly - 2 sets - 10 reps Ankle and Toe Plantarflexion with Resistance - 1 x daily - 7 x weekly - 2 sets - 10 reps Ankle Dorsiflexion with Resistance - 1 x daily - 7 x weekly - 3 sets - 10 reps Isometric Ankle Inversion - 1 x daily - 7 x weekly - 3 sets - 10 reps Isometric Ankle Eversion at Wall - 1 x daily - 7 x weekly - 3 sets - 10 reps Seated Ankle Eversion with Resistance - 1 x daily - 7 x weekly - 3 sets - 10 reps Active Straight Leg Raise with Quad Set - 1 x daily - 7 x weekly - 3 sets - 10 reps Clamshell - 1 x daily - 7 x weekly - 3 sets - 10 reps

## 2020-07-19 NOTE — Therapy (Signed)
[facility] [address] [place], [place], [address] Phone: [phone]   Fax:  [phone]  Physical Therapy Treatment  Patient Details  Name: Rebekah Davis MRN: [id number] Date of Birth: 21-Jan-1986 Referring Provider (PT): Dr. Nicki Guadalajara   Encounter Date: 07/19/2020   PT End of Session - 07/19/20 1526    Visit Number 3    Date for PT Re-Evaluation 09/29/20    Authorization Type Medicare/medicaid    Authorization Time Period 05/28/20 to 07/21/20    Authorization - Visit Number 2    Authorization - Number of Visits 10    PT Start Time 1446    PT Stop Time 1536    PT Time Calculation (min) 50 min    Activity Tolerance No increased pain;Patient tolerated treatment well    Behavior During Therapy [facility] for tasks assessed/performed           Past Medical History:  Diagnosis Date  . Arthritis   . Kidney stones   . Migraine   . PCOS (polycystic ovarian syndrome)     History reviewed. No pertinent surgical history.  There were no vitals filed for this visit.   Subjective Assessment - 07/19/20 1452    Subjective Pt states she is still feeling good.  Pt hasn't worn the boot yet today but still uses at work.  Pt denies pain today.    Patient Stated Goals not have surgery and decrease pain    Currently in Pain? No/denies                             [facility] Adult PT Treatment/Exercise - 07/19/20 0001      Vasopneumatic   Number Minutes Vasopneumatic  15 minutes    Vasopnuematic Location  Ankle    Vasopneumatic Pressure Medium    Vasopneumatic Temperature  3 flakes      Manual Therapy   Soft tissue mobilization Lt LE: gastroc, soleus, peroneal , plantaris      Ankle Exercises: Seated   BAPS Sitting;Level 2;15 reps   circle both ways   Other Seated Ankle Exercises LAQ 2lb - Lt LE    Other Seated Ankle Exercises isometric inversion      Ankle Exercises: Supine   T-Band PF green band with  eccentric muscle activation - 20x    Other Supine Ankle Exercises DF hold with SLR - 2 x 10                  PT Education - 07/19/20 1525    Education Details DN info and Access Code: [email or web address]    Person(s) Educated Patient    Methods Explanation;Demonstration;Tactile cues;Verbal cues;Handout    Comprehension Returned demonstration;Verbalized understanding            PT Short Term Goals - 07/07/20 1511      PT SHORT TERM GOAL #1   Title Pt will be independent with her initial HEP to increase ankle flexibility and decrease pain.    Time 4    Period Weeks    Status New    Target Date 08/04/20             PT Long Term Goals - 07/07/20 1512      PT LONG TERM GOAL #1   Title Pt will have atleast 15 deg of passive Lt ankle dorsiflexion to improve foot clearance during ambulation.    Baseline ---    Time  12    Period Weeks    Status New    Target Date 09/29/20      PT LONG TERM GOAL #2   Title Pt will be able to ambulate with adequate push off and step length x100 secondary to improved pain and gastroc strength.    Time 12    Period Weeks    Status New    Target Date 09/29/20      PT LONG TERM GOAL #3   Title Pt will be able to complete atleast 10 reps of single leg heel raise with or without pain to reflect improvements in gastroc strength.    Baseline --    Time 12    Period Weeks    Status New    Target Date 09/29/20      PT LONG TERM GOAL #4   Title Pt will be able to maintain single leg stance on the Lt for atleast 10 seconds, 2/3 trials, to reflect increase in ankle proprioception.    Baseline ---    Time 12    Period Weeks    Status New    Target Date 09/29/20      PT LONG TERM GOAL #5   Title FOTO </=46%    Baseline --    Time 12    Period Weeks    Status New    Target Date 09/29/20                 Plan - 07/19/20 1510    Clinical Impression Statement Pt did well with progressing the exercises today.  She was able to do green  resistance with eccentric calf raises.  Pt felt more mobility after STM to gastroc, peroneals and plantaris muscle.  Pt was educated in dry needling as possibility to release muscle tension but she is very nervous about needles.  Pt will benefit from skilled PT to work on gradual increasing strength for return to full function.    PT Treatment/Interventions ADLs/Self Care Home Management;Aquatic Therapy;Cryotherapy;Iontophoresis 4mg /ml Dexamethasone;Therapeutic activities;Therapeutic exercise;Balance training;Neuromuscular re-education;Manual techniques;Stair training;Gait training;Dry needling;Passive range of motion;Taping;Patient/family education;Vasopneumatic Device    PT Next Visit Plan pt is open to getting dry needling next - try one; continue Lt ankle and LE strength and ROM; f/u with    PT Home Exercise Plan Access Code: [email or web address]    Consulted and Agree with Plan of Care Patient           Patient will benefit from skilled therapeutic intervention in order to improve the following deficits and impairments:  Abnormal gait, Decreased balance, Difficulty walking, Increased fascial restricitons, Pain, Decreased activity tolerance, Increased muscle spasms, Decreased strength, Decreased mobility, Increased edema  Visit Diagnosis: Pain in left foot  Difficulty in walking, not elsewhere classified     Problem List Patient Active Problem List   Diagnosis Date Noted  . Achilles tendinitis 03/11/2020  . Ovarian cyst 02/19/2020  . Deliberate self-cutting 01/23/2020  . Non-suicidal self harm as coping mechanism 12/25/2019  . Diarrhea 06/13/2018  . Hypomagnesemia 06/13/2018  . Transaminitis 06/13/2018  . C. difficile diarrhea 06/12/2018  . Oral thrush 06/12/2018  . Suicidal ideation 04/12/2018  . Chest pain 03/28/2018  . Obesity 03/28/2018  . OSA (obstructive sleep apnea) 03/28/2018  . Acute cystitis 03/10/2018  . GERD (gastroesophageal reflux disease) 03/10/2018  . Depression with  suicidal ideation 03/09/2018  . PTSD (post-traumatic stress disorder) 03/09/2018  . 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 Polycystic disease,  ovaries 07/05/2016  . Abdominal pain, suprapubic 06/04/2016  . Spondylisthesis 04/20/2015  . Herniated lumbar intervertebral disc 02/28/2013  . Sciatica of left side 02/28/2013  . Borderline personality disorder (HCC) 12/29/2012  . Anxiety 12/24/2012  . COPD (chronic obstructive pulmonary disease) (HCC) 12/24/2012  . Essential hypertension 12/24/2012  . Low back pain 07/22/2012    Junious Silk, PT 07/19/2020, 4:41 PM  [facility] [address], [address] [place], [place], [address] Phone: [phone]   Fax:  [phone]  Name: Khalis Hittle MRN: [id number] Date of Birth: [birth year]

## 2020-07-21 ENCOUNTER — Encounter: Payer: Medicare Other | Admitting: Physical Therapy

## 2020-07-26 ENCOUNTER — Encounter: Payer: Self-pay | Admitting: Physical Therapy

## 2020-07-26 ENCOUNTER — Ambulatory Visit: Payer: Medicare Other | Admitting: Physical Therapy

## 2020-07-26 ENCOUNTER — Other Ambulatory Visit: Payer: Self-pay

## 2020-07-26 DIAGNOSIS — M79672 Pain in left foot: Secondary | ICD-10-CM

## 2020-07-26 DIAGNOSIS — R262 Difficulty in walking, not elsewhere classified: Secondary | ICD-10-CM

## 2020-07-26 NOTE — Therapy (Signed)
[facility] [address] [place], [place], [address] Phone: [phone]   Fax:  [phone]  Physical Therapy Treatment  Patient Details  Name: Rebekah Davis MRN: [id number] Date of Birth: 1986-05-02 Referring Provider (PT): Dr. Nicki Guadalajara   Encounter Date: 07/26/2020   PT End of Session - 07/26/20 1353    Visit Number 4    Date for PT Re-Evaluation 09/29/20    Authorization Type Medicare/medicaid    Authorization Time Period 07/07/20-09/29/20    Authorization - Visit Number 4    Authorization - Number of Visits 10    PT Start Time 1355    PT Stop Time 1440    PT Time Calculation (min) 45 min    Activity Tolerance No increased pain;Patient tolerated treatment well    Behavior During Therapy [facility] for tasks assessed/performed           Past Medical History:  Diagnosis Date  . Arthritis   . Kidney stones   . Migraine   . PCOS (polycystic ovarian syndrome)     History reviewed. No pertinent surgical history.  There were no vitals filed for this visit.   Subjective Assessment - 07/26/20 1355    Subjective I stood x 6 hours on Sat and then rearranged my house x 1.5 hours without the boot so I was in a lot of pain.  Pt arrives wearing boot today.  Took lots of Tylenol Sat and pain still kept me awake.  I do feel better aside from that incident this weekend - had gotten up to 4 hours outside the boot with combined sitting/standing.    Limitations Walking;Standing    Patient Stated Goals not have surgery and decrease pain    Currently in Pain? Yes    Pain Score 6     Pain Location Heel    Pain Orientation Right;Posterior    Pain Descriptors / Indicators Burning    Pain Type Chronic pain    Pain Onset More than a month ago    Pain Frequency Constant    Aggravating Factors  prolonged standing, stairs, walking without the boot, bending over    Pain Relieving Factors wearing the boot, ice, tylenol, elevation               [facility] PT Assessment - 07/26/20 0001      Figure 8 Edema   Figure 8 - Right  53    Figure 8 - Left  55                         [facility] Adult PT Treatment/Exercise - 07/26/20 0001      Vasopneumatic   Number Minutes Vasopneumatic  10 minutes    Vasopnuematic Location  Ankle    Vasopneumatic Pressure Medium   PT loosened ankle sleeve for Pt comfort   Vasopneumatic Temperature  3 flakes      Manual Therapy   Soft tissue mobilization Lt LE: gastroc med and lat heads, soleus, posterior tibialis      Ankle Exercises: Supine   Isometrics inversion ball squeeze and eversion green loop band 10x3 sec holds each    Other Supine Ankle Exercises DF hold with SLR - 2 x 10, PT TC for neutral                    PT Short Term Goals - 07/26/20 1444      PT SHORT TERM GOAL #1  Title Pt will be independent with her initial HEP to increase ankle flexibility and decrease pain.    Status On-going             PT Long Term Goals - 07/07/20 1512      PT LONG TERM GOAL #1   Title Pt will have atleast 15 deg of passive Lt ankle dorsiflexion to improve foot clearance during ambulation.    Baseline ---    Time 12    Period Weeks    Status New    Target Date 09/29/20      PT LONG TERM GOAL #2   Title Pt will be able to ambulate with adequate push off and step length x100 secondary to improved pain and gastroc strength.    Time 12    Period Weeks    Status New    Target Date 09/29/20      PT LONG TERM GOAL #3   Title Pt will be able to complete atleast 10 reps of single leg heel raise with or without pain to reflect improvements in gastroc strength.    Baseline --    Time 12    Period Weeks    Status New    Target Date 09/29/20      PT LONG TERM GOAL #4   Title Pt will be able to maintain single leg stance on the Lt for atleast 10 seconds, 2/3 trials, to reflect increase in ankle proprioception.    Baseline ---    Time 12    Period Weeks     Status New    Target Date 09/29/20      PT LONG TERM GOAL #5   Title FOTO </=46%    Baseline --    Time 12    Period Weeks    Status New    Target Date 09/29/20                 Plan - 07/26/20 1434    Clinical Impression Statement Pt reports high level of pain following 6 hours of standing without boot at work over the weekend.  Prior to the weekend she was pleased with her progress, spending up to 4 hours a day out of boot.  She continues to have pain with prolonged standing, climbing stairs, bending and walking.  Pain causes her to need to sit and wear boot while at work.  She continues to get relief with STM, ice, Tylenol and Mobic and elevation.  She has signif trigger points and tightness in gastroc/soleus, posterior tib and achilles tendon.  She has consistent ongoing edema measuring 55cm for fig 8 at Lt ankle compared to 53cm on Rt.  She was very tender to light palpation today at posterior aspect of heel with noted swelling.  PT has dicussed DN with Pt but Pt is still hesitant and unsure if she is comfortable with this intervention.  Pt feels she is making incremental progress that is slow but encouraging.  She will contiue to benefit from ongoing assessment and skilled intervention to address pain, edema, mobility, strength, and stabilization of Lt ankle.    Personal Factors and Comorbidities Time since onset of injury/illness/exacerbation;Fitness    Examination-Activity Limitations Stairs;Stand;Locomotion Level    Examination-Participation Artist;Yard Work    Rehab Potential Good    PT Frequency 2x / week    PT Treatment/Interventions ADLs/Self Care Home Management;Aquatic Therapy;Cryotherapy;Iontophoresis 4mg /ml Dexamethasone;Therapeutic activities;Therapeutic exercise;Balance training;Neuromuscular re-education;Manual techniques;Stair training;Gait training;Dry needling;Passive range of motion;Taping;Patient/family education;Vasopneumatic Device  PT Next  Visit Plan see if Pt wants to try dry needling - try one; continue Lt ankle and LE strength and ROM; game ready med compression    PT Home Exercise Plan Access Code: [email or web address]    Consulted and Agree with Plan of Care Patient           Patient will benefit from skilled therapeutic intervention in order to improve the following deficits and impairments:  Abnormal gait, Decreased balance, Difficulty walking, Increased fascial restricitons, Pain, Decreased activity tolerance, Increased muscle spasms, Decreased strength, Decreased mobility, Increased edema  Visit Diagnosis: Pain in left foot  Difficulty in walking, not elsewhere classified     Problem List Patient Active Problem List   Diagnosis Date Noted  . Achilles tendinitis 03/11/2020  . Ovarian cyst 02/19/2020  . Deliberate self-cutting 01/23/2020  . Non-suicidal self harm as coping mechanism 12/25/2019  . Diarrhea 06/13/2018  . Hypomagnesemia 06/13/2018  . Transaminitis 06/13/2018  . C. difficile diarrhea 06/12/2018  . Oral thrush 06/12/2018  . Suicidal ideation 04/12/2018  . Chest pain 03/28/2018  . Obesity 03/28/2018  . OSA (obstructive sleep apnea) 03/28/2018  . Acute cystitis 03/10/2018  . GERD (gastroesophageal reflux disease) 03/10/2018  . Depression with suicidal ideation 03/09/2018  . PTSD (post-traumatic stress disorder) 03/09/2018  . 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 Polycystic disease, ovaries 07/05/2016  . Abdominal pain, suprapubic 06/04/2016  . Spondylisthesis 04/20/2015  . Herniated lumbar intervertebral disc 02/28/2013  . Sciatica of left side 02/28/2013  . Borderline personality disorder (HCC) 12/29/2012  . Anxiety 12/24/2012  . COPD (chronic obstructive pulmonary disease) (HCC) 12/24/2012  . Essential hypertension 12/24/2012  . Low back pain 07/22/2012    Morton Peters, PT 07/26/20 2:45 PM   [facility] [address], [address] [place], [place], [address] Phone: [phone]   Fax:  [phone]  Name: Narya Beavin MRN: [id number] Date of Birth: 1986-10-03

## 2020-08-04 ENCOUNTER — Other Ambulatory Visit: Payer: Self-pay

## 2020-08-04 ENCOUNTER — Encounter: Payer: Self-pay | Admitting: Physical Therapy

## 2020-08-04 ENCOUNTER — Ambulatory Visit: Payer: Medicare Other | Admitting: Physical Therapy

## 2020-08-04 DIAGNOSIS — M79672 Pain in left foot: Secondary | ICD-10-CM | POA: Diagnosis not present

## 2020-08-04 DIAGNOSIS — R262 Difficulty in walking, not elsewhere classified: Secondary | ICD-10-CM

## 2020-08-04 NOTE — Patient Instructions (Signed)
Dry needling info 

## 2020-08-04 NOTE — Therapy (Signed)
[facility] [address] [place], [place], [address] Phone: [phone]   Fax:  [phone]  Physical Therapy Treatment  Patient Details  Name: Rebekah Davis MRN: [id number] Date of Birth: [birth year] Referring Provider (PT): Dr. Nicki Guadalajara   Encounter Date: 08/04/2020   PT End of Session - 08/04/20 1610    Visit Number 5    Date for PT Re-Evaluation 09/29/20    Authorization Type Medicare/medicaid    Authorization Time Period 07/07/20-09/29/20    Authorization - Visit Number 5    Authorization - Number of Visits 10    PT Start Time 1446    PT Stop Time 1535    PT Time Calculation (min) 49 min    Activity Tolerance No increased pain;Patient tolerated treatment well    Behavior During Therapy [facility] for tasks assessed/performed           Past Medical History:  Diagnosis Date  . Arthritis   . Kidney stones   . Migraine   . PCOS (polycystic ovarian syndrome)     History reviewed. No pertinent surgical history.  There were no vitals filed for this visit.       [facility] PT Assessment - 08/04/20 0001      Figure 8 Edema   Figure 8 - Left  54                         [facility] Adult PT Treatment/Exercise - 08/04/20 0001      Vasopneumatic   Number Minutes Vasopneumatic  15 minutes    Vasopnuematic Location  Ankle    Vasopneumatic Pressure Medium    Vasopneumatic Temperature  3 flakes      Manual Therapy   Soft tissue mobilization Lt LE: gastroc med and lat heads, soleus, posterior tibialis      Ankle Exercises: Stretches   Gastroc Stretch 3 reps;30 seconds    Gastroc Stretch Limitations inner and outer gastroc using sheet      Ankle Exercises: Supine   T-Band PF blue band with eccentric and concentric movements - 20x            Trigger Point Dry Needling - 08/04/20 0001    Consent Given? Yes    Education Handout Provided Yes    Muscles Treated Lower Quadrant Gastrocnemius;Peroneals     Dry Needling Comments Lt side    Peroneals Response Twitch response elicited;Palpable increased muscle length    Gastrocnemius Response Twitch response elicited;Palpable increased muscle length   lateral gastroc               PT Education - 08/04/20 1528    Education Details DN info    Person(s) Educated Patient    Methods Explanation;Verbal cues;Demonstration;Handout    Comprehension Verbalized understanding;Returned demonstration            PT Short Term Goals - 07/26/20 1444      PT SHORT TERM GOAL #1   Title Pt will be independent with her initial HEP to increase ankle flexibility and decrease pain.    Status On-going             PT Long Term Goals - 07/07/20 1512      PT LONG TERM GOAL #1   Title Pt will have atleast 15 deg of passive Lt ankle dorsiflexion to improve foot clearance during ambulation.    Baseline ---    Time 12    Period Weeks  Status New    Target Date 09/29/20      PT LONG TERM GOAL #2   Title Pt will be able to ambulate with adequate push off and step length x100 secondary to improved pain and gastroc strength.    Time 12    Period Weeks    Status New    Target Date 09/29/20      PT LONG TERM GOAL #3   Title Pt will be able to complete atleast 10 reps of single leg heel raise with or without pain to reflect improvements in gastroc strength.    Baseline --    Time 12    Period Weeks    Status New    Target Date 09/29/20      PT LONG TERM GOAL #4   Title Pt will be able to maintain single leg stance on the Lt for atleast 10 seconds, 2/3 trials, to reflect increase in ankle proprioception.    Baseline ---    Time 12    Period Weeks    Status New    Target Date 09/29/20      PT LONG TERM GOAL #5   Title FOTO </=46%    Baseline --    Time 12    Period Weeks    Status New    Target Date 09/29/20                 Plan - 08/04/20 1611    Clinical Impression Statement Pt was open to trying dry needling today.  Pt  did well and no increased pain and release of trigger points in lateral gastroc and peroneals.  PT only did those areas although there were more trigger points, but pt was wanting to make sure she could feel the response to the needling before attempting more.  Pt is now off boot all day.  She was able to increase resistance for eccentric Plantarflexion.  Pt will benefit from skilled PT to continue to address ankle strength and trigger points    PT Treatment/Interventions ADLs/Self Care Home Management;Aquatic Therapy;Cryotherapy;Iontophoresis 4mg /ml Dexamethasone;Therapeutic activities;Therapeutic exercise;Balance training;Neuromuscular re-education;Manual techniques;Stair training;Gait training;Dry needling;Passive range of motion;Taping;Patient/family education;Vasopneumatic Device    PT Next Visit Plan f/u on dry needling and add medial gastroc and soleus if tolerated; f/u on increased band resistance add standing weight shifting    PT Home Exercise Plan Access Code: [email or web address]    Consulted and Agree with Plan of Care Patient           Patient will benefit from skilled therapeutic intervention in order to improve the following deficits and impairments:  Abnormal gait, Decreased balance, Difficulty walking, Increased fascial restricitons, Pain, Decreased activity tolerance, Increased muscle spasms, Decreased strength, Decreased mobility, Increased edema  Visit Diagnosis: Pain in left foot  Difficulty in walking, not elsewhere classified     Problem List Patient Active Problem List   Diagnosis Date Noted  . Achilles tendinitis 03/11/2020  . Ovarian cyst 02/19/2020  . Deliberate self-cutting 01/23/2020  . Non-suicidal self harm as coping mechanism 12/25/2019  . Diarrhea 06/13/2018  . Hypomagnesemia 06/13/2018  . Transaminitis 06/13/2018  . C. difficile diarrhea 06/12/2018  . Oral thrush 06/12/2018  . Suicidal ideation 04/12/2018  . Chest pain 03/28/2018  . Obesity 03/28/2018  . OSA  (obstructive sleep apnea) 03/28/2018  . Acute cystitis 03/10/2018  . GERD (gastroesophageal reflux disease) 03/10/2018  . Depression with suicidal ideation 03/09/2018  . PTSD (post-traumatic stress disorder) 03/09/2018  . Major depressive disorder, recurrent  severe without psychotic features (HCC) 02/27/2018  . Polycystic disease, ovaries 07/05/2016  . Abdominal pain, suprapubic 06/04/2016  . Spondylisthesis 04/20/2015  . Herniated lumbar intervertebral disc 02/28/2013  . Sciatica of left side 02/28/2013  . Borderline personality disorder (HCC) 12/29/2012  . Anxiety 12/24/2012  . COPD (chronic obstructive pulmonary disease) (HCC) 12/24/2012  . Essential hypertension 12/24/2012  . Low back pain 07/22/2012    Junious Silk, PT 08/04/2020, 4:52 PM  [facility] [address], [address] [place], [place], [address] Phone: [phone]   Fax:  [phone]  Name: Darah Simkin MRN: [id number] Date of Birth: [birth year]

## 2020-08-09 ENCOUNTER — Encounter: Payer: Self-pay | Admitting: Physical Therapy

## 2020-08-09 ENCOUNTER — Ambulatory Visit: Payer: Medicare Other | Admitting: Physical Therapy

## 2020-08-09 ENCOUNTER — Other Ambulatory Visit: Payer: Self-pay

## 2020-08-09 DIAGNOSIS — M79672 Pain in left foot: Secondary | ICD-10-CM

## 2020-08-09 DIAGNOSIS — R262 Difficulty in walking, not elsewhere classified: Secondary | ICD-10-CM

## 2020-08-09 NOTE — Therapy (Signed)
[facility] [address], [address] [place], [place], [address] Phone: [phone]   Fax:  [phone]  Physical Therapy Treatment  Patient Details  Name: Rebekah Davis MRN: [id number] Date of Birth: [birth year] Referring Provider (PT): Dr. Nicki Guadalajara   Encounter Date: 08/09/2020   PT End of Session - 08/09/20 1445    Visit Number 6    Date for PT Re-Evaluation 09/29/20    Authorization Type Medicare/medicaid    Authorization Time Period 07/07/20-09/29/20    Authorization - Visit Number 6    Authorization - Number of Visits 10    PT Start Time 1404    PT Stop Time 1445    PT Time Calculation (min) 41 min    Activity Tolerance No increased pain;Patient tolerated treatment well    Behavior During Therapy [facility] for tasks assessed/performed           Past Medical History:  Diagnosis Date  . Arthritis   . Kidney stones   . Migraine   . PCOS (polycystic ovarian syndrome)     History reviewed. No pertinent surgical history.  There were no vitals filed for this visit.   Subjective Assessment - 08/09/20 1406    Subjective Pt states that things are going ok. She has not worn her boot since last Tuesday. She did not like the dry needling. She feels that the band is a little too much resistance and her foot burns after she finishes.    Limitations Walking;Standing    Patient Stated Goals not have surgery and decrease pain    Currently in Pain? Yes    Pain Score 4     Pain Location Heel    Pain Orientation Left;Posterior    Pain Descriptors / Indicators Burning    Pain Type Chronic pain    Pain Radiating Towards wraps around the heel    Pain Onset More than a month ago    Pain Frequency Constant    Aggravating Factors  being on her feet too long; Blue band with HEP                             [facility] Adult PT Treatment/Exercise - 08/09/20 0001      Manual Therapy   Joint Mobilization Grade III-IV AP  talocrural mobilization x3 bouts 30 sec each     Soft tissue mobilization Lt gastroc, soleus      Ankle Exercises: Sidelying   Other Sidelying Ankle Exercises Lt hip abduction 2x10 reps       Ankle Exercises: Supine   T-Band Lt ankle PF with green TB 2x10 reps - pain increased to 4/10 after 1st set but this returned to baseline after 1 minute, increased to 6/10 after 2nd set       Ankle Exercises: Seated   Other Seated Ankle Exercises Lt great toe flexion into towel 10x5 sec hold                   PT Education - 08/09/20 1445    Education Details encouraged wearing boot more consistently until she is appropriately weaned out of it; adjusted theraband for HEP    Person(s) Educated Patient    Methods Explanation    Comprehension Verbalized understanding            PT Short Term Goals - 07/26/20 1444      PT SHORT TERM GOAL #1   Title Pt will  be independent with her initial HEP to increase ankle flexibility and decrease pain.    Status On-going             PT Long Term Goals - 07/07/20 1512      PT LONG TERM GOAL #1   Title Pt will have atleast 15 deg of passive Lt ankle dorsiflexion to improve foot clearance during ambulation.    Baseline ---    Time 12    Period Weeks    Status New    Target Date 09/29/20      PT LONG TERM GOAL #2   Title Pt will be able to ambulate with adequate push off and step length x100 secondary to improved pain and gastroc strength.    Time 12    Period Weeks    Status New    Target Date 09/29/20      PT LONG TERM GOAL #3   Title Pt will be able to complete atleast 10 reps of single leg heel raise with or without pain to reflect improvements in gastroc strength.    Baseline --    Time 12    Period Weeks    Status New    Target Date 09/29/20      PT LONG TERM GOAL #4   Title Pt will be able to maintain single leg stance on the Lt for atleast 10 seconds, 2/3 trials, to reflect increase in ankle proprioception.    Baseline ---     Time 12    Period Weeks    Status New    Target Date 09/29/20      PT LONG TERM GOAL #5   Title FOTO </=46%    Baseline --    Time 12    Period Weeks    Status New    Target Date 09/29/20                 Plan - 08/09/20 1446    Clinical Impression Statement Pt has gone nearly one week without her boot, but she has noticed more pain and burning throughout her day. She also reports more difficulty and pain with her HEP using the blue TB. PT discussed how to safely wean out of her boot and encouraged her to pick back up on this moving forward. PT also made an adjustment to her resistance with plantarflexion and pt was able to complete without significant increase in pain following 10 repetitions. Pt is hesitant to try dry needling again, but was agreeable to soft tissue mobilization end of session.    PT Treatment/Interventions ADLs/Self Care Home Management;Aquatic Therapy;Cryotherapy;Iontophoresis 4mg /ml Dexamethasone;Therapeutic activities;Therapeutic exercise;Balance training;Neuromuscular re-education;Manual techniques;Stair training;Gait training;Dry needling;Passive range of motion;Taping;Patient/family education;Vasopneumatic Device    PT Next Visit Plan pt hesitant to do more DN-may be open if continue to offer it in the future; f/u on boot wearing; gradual progressions of gastroc and soleus strenght, standing weight shift if able    PT Home Exercise Plan Access Code: [email or web address]    Consulted and Agree with Plan of Care Patient           Patient will benefit from skilled therapeutic intervention in order to improve the following deficits and impairments:  Abnormal gait, Decreased balance, Difficulty walking, Increased fascial restricitons, Pain, Decreased activity tolerance, Increased muscle spasms, Decreased strength, Decreased mobility, Increased edema  Visit Diagnosis: Pain in left foot  Difficulty in walking, not elsewhere classified     Problem List Patient  Active Problem List  Diagnosis Date Noted  . Achilles tendinitis 03/11/2020  . Ovarian cyst 02/19/2020  . Deliberate self-cutting 01/23/2020  . Non-suicidal self harm as coping mechanism 12/25/2019  . Diarrhea 06/13/2018  . Hypomagnesemia 06/13/2018  . Transaminitis 06/13/2018  . C. difficile diarrhea 06/12/2018  . Oral thrush 06/12/2018  . Suicidal ideation 04/12/2018  . Chest pain 03/28/2018  . Obesity 03/28/2018  . OSA (obstructive sleep apnea) 03/28/2018  . Acute cystitis 03/10/2018  . GERD (gastroesophageal reflux disease) 03/10/2018  . Depression with suicidal ideation 03/09/2018  . PTSD (post-traumatic stress disorder) 03/09/2018  . 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 Polycystic disease, ovaries 07/05/2016  . Abdominal pain, suprapubic 06/04/2016  . Spondylisthesis 04/20/2015  . Herniated lumbar intervertebral disc 02/28/2013  . Sciatica of left side 02/28/2013  . Borderline personality disorder (HCC) 12/29/2012  . Anxiety 12/24/2012  . COPD (chronic obstructive pulmonary disease) (HCC) 12/24/2012  . Essential hypertension 12/24/2012  . Low back pain 07/22/2012    2:54 PM,08/09/20 Donita Brooks PT, DPT [facility]  [phone]  [facility] [address], [address] [place], [place], [address] Phone: [phone]   Fax:  [phone]  Name: Ednah Hammock MRN: [id number] Date of Birth: Aug 12, 1986

## 2020-08-16 ENCOUNTER — Ambulatory Visit: Payer: Medicare Other | Attending: Orthopaedic Surgery | Admitting: Physical Therapy

## 2020-08-16 ENCOUNTER — Encounter: Payer: Self-pay | Admitting: Physical Therapy

## 2020-08-16 ENCOUNTER — Other Ambulatory Visit: Payer: Self-pay

## 2020-08-16 DIAGNOSIS — R262 Difficulty in walking, not elsewhere classified: Secondary | ICD-10-CM | POA: Insufficient documentation

## 2020-08-16 DIAGNOSIS — M79672 Pain in left foot: Secondary | ICD-10-CM | POA: Diagnosis not present

## 2020-08-16 NOTE — Therapy (Signed)
[facility] [address], [address] [place], [place], [address] Phone: [phone]   Fax:  [phone]  Physical Therapy Treatment  Patient Details  Name: Rebekah Davis MRN: [id number] Date of Birth: 1986/07/24 Referring Provider (PT): Dr. Nicki Guadalajara   Encounter Date: 08/16/2020   PT End of Session - 08/16/20 1223    Visit Number 7    Date for PT Re-Evaluation 09/29/20    Authorization Type Medicare/medicaid    Authorization Time Period 07/07/20-09/29/20    Authorization - Visit Number 7    Authorization - Number of Visits 10    PT Start Time 1225    PT Stop Time 1315    PT Time Calculation (min) 50 min    Activity Tolerance No increased pain;Patient tolerated treatment well    Behavior During Therapy [facility] for tasks assessed/performed           Past Medical History:  Diagnosis Date   Arthritis    Kidney stones    Migraine    PCOS (polycystic ovarian syndrome)     History reviewed. No pertinent surgical history.  There were no vitals filed for this visit.   Subjective Assessment - 08/16/20 1224    Subjective I am now wearing the boot for work and can go about 2 hours before I need to sit.  It goes from a 3/10 to 7/10 in <5 min.  Feels like fire.  I spend time out of the boot when away from work.  I still avoid fully engaging the foot due to lots of pain when I do use it.    Limitations Walking;Standing    Patient Stated Goals not have surgery and decrease pain    Currently in Pain? Yes    Pain Score 3     Pain Location Heel    Pain Orientation Left;Posterior    Pain Descriptors / Indicators Burning    Pain Type Chronic pain    Pain Onset More than a month ago    Pain Frequency Constant    Aggravating Factors  being on feet too long    Pain Relieving Factors boot, ice, tylenol, elevation                             [facility] Adult PT Treatment/Exercise - 08/16/20 0001      Self-Care    Self-Care Other Self-Care Comments    Other Self-Care Comments  use initial increase in pain from 3/10 to 4-5/10 to be signal to change activity/position/boot wearing to avoid spikes of pain      Exercises   Exercises Ankle;Knee/Hip      Knee/Hip Exercises: Aerobic   Nustep L2 x 5' PT present, pain from 3-5/10 with this      Knee/Hip Exercises: Sidelying   Hip ABduction Strengthening;2 sets;5 reps      Cryotherapy   Number Minutes Cryotherapy 5 Minutes    Cryotherapy Location Ankle    Type of Cryotherapy Ice pack      Manual Therapy   Joint Mobilization talar mobs into plantar glide, talocrural posterior glide/distraction Gr II/III 3x20 sec, midfoot mobs for plantar glides      Ankle Exercises: Stretches   Other Stretch seated calf stretch knee flexion foot slider static x 30 sec, dynamic with pause x 5 reps, Lt      Ankle Exercises: Seated   Heel Raises Both;5 reps    Heel Raises Limitations holding 8lb  dumbbell on Lt thigh    BAPS Level 2;Sitting    BAPS Limitations 5 circles each way    Other Seated Ankle Exercises 1/2 foam roll ankle AA/ROM DF/PF 2x2 min, inv/ever 1x2'    Other Seated Ankle Exercises seated bil heel raises (seated on foam feet on 2" riser) 2x5                    PT Short Term Goals - 07/26/20 1444      PT SHORT TERM GOAL #1   Title Pt will be independent with her initial HEP to increase ankle flexibility and decrease pain.    Status On-going             PT Long Term Goals - 07/07/20 1512      PT LONG TERM GOAL #1   Title Pt will have atleast 15 deg of passive Lt ankle dorsiflexion to improve foot clearance during ambulation.    Baseline ---    Time 12    Period Weeks    Status New    Target Date 09/29/20      PT LONG TERM GOAL #2   Title Pt will be able to ambulate with adequate push off and step length x100 secondary to improved pain and gastroc strength.    Time 12    Period Weeks    Status New    Target Date 09/29/20       PT LONG TERM GOAL #3   Title Pt will be able to complete atleast 10 reps of single leg heel raise with or without pain to reflect improvements in gastroc strength.    Baseline --    Time 12    Period Weeks    Status New    Target Date 09/29/20      PT LONG TERM GOAL #4   Title Pt will be able to maintain single leg stance on the Lt for atleast 10 seconds, 2/3 trials, to reflect increase in ankle proprioception.    Baseline ---    Time 12    Period Weeks    Status New    Target Date 09/29/20      PT LONG TERM GOAL #5   Title FOTO </=46%    Baseline --    Time 12    Period Weeks    Status New    Target Date 09/29/20                 Plan - 08/16/20 1327    Clinical Impression Statement Pt has been better about wearing her boot at work and trying short duration time out of boot other times.  She can tolerate standing x 2 hours while wearing boot.  Pain can spike from 3/10 to 7/10 within minutes when she reaches her activity tolerance threshold.  PT encouraged her to change position (stand to sit) and tasks as able to allow for change in position to shoot for activity with pain ranges from 3-5/10 to avoid high spikes of pain.  She continues to have midfoot and ankle joint mobility limitations with heel pain limiting tolerance of meaningful mobilization.  Stretching of the achilles has felt like "tearing."  PT spent most of session having Pt perform focused AA/ROM and A/ROM for neuro control and ROM within pain limit of up to 5/10.  Pt felt fatigue with seated heel raises but was able to perform without heel pain.  PT added 8lb weight on thigh for this for  the last set.  Pt was not as swollen today so used ice pack vs vaso for pain end of session.  PT asked Pt to bring Lt shoe next time so we can work on weight shifting and gait as tol.    PT Next Visit Plan did Pt bring shoe, NuStep, AA/ROM, heel raises seated, jt mobs as tol, weight shifts and gait as tol    PT Home Exercise Plan  Access Code: [email or web address]    Consulted and Agree with Plan of Care Patient           Patient will benefit from skilled therapeutic intervention in order to improve the following deficits and impairments:     Visit Diagnosis: Pain in left foot  Difficulty in walking, not elsewhere classified     Problem List Patient Active Problem List   Diagnosis Date Noted   Achilles tendinitis 03/11/2020   Ovarian cyst 02/19/2020   Deliberate self-cutting 01/23/2020   Non-suicidal self harm as coping mechanism 12/25/2019   Diarrhea 06/13/2018   Hypomagnesemia 06/13/2018   Transaminitis 06/13/2018   C. difficile diarrhea 06/12/2018   Oral thrush 06/12/2018   Suicidal ideation 04/12/2018   Chest pain 03/28/2018   Obesity 03/28/2018   OSA (obstructive sleep apnea) 03/28/2018   Acute cystitis 03/10/2018   GERD (gastroesophageal reflux disease) 03/10/2018   Depression with suicidal ideation 03/09/2018   PTSD (post-traumatic stress disorder) 03/09/2018   Major depressive disorder, recurrent severe without psychotic features (HCC) 02/27/2018   Polycystic disease, ovaries 07/05/2016   Abdominal pain, suprapubic 06/04/2016   Spondylisthesis 04/20/2015   Herniated lumbar intervertebral disc 02/28/2013   Sciatica of left side 02/28/2013   Borderline personality disorder (HCC) 12/29/2012   Anxiety 12/24/2012   COPD (chronic obstructive pulmonary disease) (HCC) 12/24/2012   Essential hypertension 12/24/2012   Low back pain 07/22/2012    Ahmed Inniss, PT 08/16/20 1:34 PM   [facility] [address], [address] [place], [place], [address] Phone: [phone]   Fax:  [phone]  Name: Rebekah Davis MRN: [id number] Date of Birth: 1986-07-27

## 2020-08-24 ENCOUNTER — Encounter: Payer: Self-pay | Admitting: Physical Therapy

## 2020-08-24 ENCOUNTER — Ambulatory Visit: Payer: Medicare Other | Admitting: Physical Therapy

## 2020-08-24 ENCOUNTER — Other Ambulatory Visit: Payer: Self-pay

## 2020-08-24 DIAGNOSIS — R262 Difficulty in walking, not elsewhere classified: Secondary | ICD-10-CM

## 2020-08-24 DIAGNOSIS — M79672 Pain in left foot: Secondary | ICD-10-CM | POA: Diagnosis not present

## 2020-08-24 NOTE — Therapy (Signed)
[facility] [address], [address] [place], [place], [address] Phone: [phone]   Fax:  [phone]  Physical Therapy Treatment  Patient Details  Name: Rebekah Davis MRN: [id number] Date of Birth: May 04, 1986 Referring Provider (PT): Dr. Nicki Guadalajara   Encounter Date: 08/24/2020   PT End of Session - 08/24/20 0847    Visit Number 8    Date for PT Re-Evaluation 09/29/20    Authorization Type Medicare/medicaid    Authorization Time Period 07/07/20-09/29/20    Authorization - Visit Number 8    Authorization - Number of Visits 10    PT Start Time 0840    PT Stop Time 0925    PT Time Calculation (min) 45 min    Activity Tolerance No increased pain;Patient tolerated treatment well    Behavior During Therapy [facility] for tasks assessed/performed           Past Medical History:  Diagnosis Date  . Arthritis   . Kidney stones   . Migraine   . PCOS (polycystic ovarian syndrome)     History reviewed. No pertinent surgical history.  There were no vitals filed for this visit.   Subjective Assessment - 08/24/20 0842    Subjective Pt arrives in shoes vs Lt CAM walker.  I am using the strategy of keeping pain in 3-5/10 range at work with breaks and boot/shoe use.    Limitations Walking;Standing    Patient Stated Goals not have surgery and decrease pain    Currently in Pain? Yes    Pain Score 4     Pain Location Heel    Pain Orientation Left;Posterior    Pain Descriptors / Indicators Burning    Pain Type Chronic pain    Pain Radiating Towards wraps around heel    Pain Onset More than a month ago    Pain Frequency Constant    Aggravating Factors  being on feet too long and wearing shoes vs boot    Pain Relieving Factors boot, ice, meds, elevation                             [facility] Adult PT Treatment/Exercise - 08/24/20 0001      Knee/Hip Exercises: Standing   Hip Flexion Stengthening;Left;Knee straight;1  set;10 reps    Hip Flexion Limitations at counter    Hip Abduction Left;2 sets;5 sets;Knee straight    Abduction Limitations at counter    Hip Extension Left;Knee straight;2 sets;5 reps    Extension Limitations at counter    Other Standing Knee Exercises weight shifts 30-50% into Lt foot in stride length with focus on pronation x 2'      Knee/Hip Exercises: Seated   Long Arc Quad Left;2 sets;10 reps    Other Seated Knee/Hip Exercises 1/2 foam roll PF/DF/pronation/supination x 2' each way    Other Seated Knee/Hip Exercises eccentric gastroc yellow band seated x 10      Manual Therapy   Soft tissue mobilization lateral gastroc and posterior tibialis on Lt in prone      Ankle Exercises: Seated   Heel Raises Both;10 reps    Heel Raises Limitations 8lb dumbbell on thigh    BAPS Level 2;Sitting    BAPS Limitations 5 circles each way    Other Seated Ankle Exercises 1/2 foam roll ankle AA/ROM DF/PF 2x2 min, inv/ever 1x2'      Ankle Exercises: Aerobic   Nustep L2 x 2' (stopped due  to sharp pain in achilles, attempted beginning and mid-session)                    PT Short Term Goals - 07/26/20 1444      PT SHORT TERM GOAL #1   Title Pt will be independent with her initial HEP to increase ankle flexibility and decrease pain.    Status On-going             PT Long Term Goals - 07/07/20 1512      PT LONG TERM GOAL #1   Title Pt will have atleast 15 deg of passive Lt ankle dorsiflexion to improve foot clearance during ambulation.    Baseline ---    Time 12    Period Weeks    Status New    Target Date 09/29/20      PT LONG TERM GOAL #2   Title Pt will be able to ambulate with adequate push off and step length x100 secondary to improved pain and gastroc strength.    Time 12    Period Weeks    Status New    Target Date 09/29/20      PT LONG TERM GOAL #3   Title Pt will be able to complete atleast 10 reps of single leg heel raise with or without pain to reflect  improvements in gastroc strength.    Baseline --    Time 12    Period Weeks    Status New    Target Date 09/29/20      PT LONG TERM GOAL #4   Title Pt will be able to maintain single leg stance on the Lt for atleast 10 seconds, 2/3 trials, to reflect increase in ankle proprioception.    Baseline ---    Time 12    Period Weeks    Status New    Target Date 09/29/20      PT LONG TERM GOAL #5   Title FOTO </=46%    Baseline --    Time 12    Period Weeks    Status New    Target Date 09/29/20                 Plan - 08/24/20 0926    Clinical Impression Statement Pt has been using strategies of intermittent boot use and seated breaks to keep pain in window of 3-5/10 with improved work tolerance with this.  She continues to have limited ability to perform full range A/ROM or stretching of Lt calf due to sharp achilles and heel pain.  She has trigger points in Lt lateral gastroc and posterior tib and is considering consent for another round of DN but is hesitant.  PT progressed Lt LE strength in standing and weight shifting for closed chain foot mechanics for gait today.  Pt was able to tolerate gentle eccentric gastroc training in non-weight bearing today.  Pt was unable to do NuStep beyond 2' due to heel pain today.  Continue along POC.    PT Frequency 2x / week    PT Duration 12 weeks    PT Treatment/Interventions ADLs/Self Care Home Management;Aquatic Therapy;Cryotherapy;Iontophoresis 4mg /ml Dexamethasone;Therapeutic activities;Therapeutic exercise;Balance training;Neuromuscular re-education;Manual techniques;Stair training;Gait training;Dry needling;Passive range of motion;Taping;Patient/family education;Vasopneumatic Device    PT Next Visit Plan consider DN Lt lateral gastroc, progress Lt LE strength, ankle neuro reed and AA/ROM and A/ROM, try Lt ankle ABCs    PT Home Exercise Plan Access Code: [email or web address]    Consulted and Agree with  Plan of Care Patient           Patient will  benefit from skilled therapeutic intervention in order to improve the following deficits and impairments:     Visit Diagnosis: Pain in left foot  Difficulty in walking, not elsewhere classified     Problem List Patient Active Problem List   Diagnosis Date Noted  . Achilles tendinitis 03/11/2020  . Ovarian cyst 02/19/2020  . Deliberate self-cutting 01/23/2020  . Non-suicidal self harm as coping mechanism 12/25/2019  . Diarrhea 06/13/2018  . Hypomagnesemia 06/13/2018  . Transaminitis 06/13/2018  . C. difficile diarrhea 06/12/2018  . Oral thrush 06/12/2018  . Suicidal ideation 04/12/2018  . Chest pain 03/28/2018  . Obesity 03/28/2018  . OSA (obstructive sleep apnea) 03/28/2018  . Acute cystitis 03/10/2018  . GERD (gastroesophageal reflux disease) 03/10/2018  . Depression with suicidal ideation 03/09/2018  . PTSD (post-traumatic stress disorder) 03/09/2018  . 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 Polycystic disease, ovaries 07/05/2016  . Abdominal pain, suprapubic 06/04/2016  . Spondylisthesis 04/20/2015  . Herniated lumbar intervertebral disc 02/28/2013  . Sciatica of left side 02/28/2013  . Borderline personality disorder (HCC) 12/29/2012  . Anxiety 12/24/2012  . COPD (chronic obstructive pulmonary disease) (HCC) 12/24/2012  . Essential hypertension 12/24/2012  . Low back pain 07/22/2012    Morton Peters, PT 08/24/20 9:29 AM   [facility] [address] [place], [place], [address] Phone: [phone]   Fax:  [phone]  Name: Munirah Doerner MRN: [id number] Date of Birth: 09-21-1986

## 2020-08-30 ENCOUNTER — Other Ambulatory Visit: Payer: Self-pay

## 2020-08-30 ENCOUNTER — Ambulatory Visit: Payer: Medicare Other | Admitting: Physical Therapy

## 2020-08-30 ENCOUNTER — Encounter: Payer: Self-pay | Admitting: Physical Therapy

## 2020-08-30 DIAGNOSIS — M79672 Pain in left foot: Secondary | ICD-10-CM | POA: Diagnosis not present

## 2020-08-30 DIAGNOSIS — R262 Difficulty in walking, not elsewhere classified: Secondary | ICD-10-CM

## 2020-08-30 NOTE — Therapy (Signed)
[facility] [address], [address] [place], [place], [address] Phone: [phone]   Fax:  [phone]  Physical Therapy Treatment  Patient Details  Name: Rebekah Davis MRN: [id number] Date of Birth: Sep 24, 1986 Referring Provider (PT): Dr. Nicki Guadalajara   Encounter Date: 08/30/2020   PT End of Session - 08/30/20 1625    Visit Number 9    Date for PT Re-Evaluation 09/29/20    Authorization Type Medicare/medicaid    Authorization Time Period 07/07/20-09/29/20    Authorization - Visit Number 9    Authorization - Number of Visits 10    PT Start Time 1530    PT Stop Time 1615    PT Time Calculation (min) 45 min    Activity Tolerance No increased pain;Patient tolerated treatment well    Behavior During Therapy [facility] for tasks assessed/performed           Past Medical History:  Diagnosis Date  . Arthritis   . Kidney stones   . Migraine   . PCOS (polycystic ovarian syndrome)     History reviewed. No pertinent surgical history.  There were no vitals filed for this visit.   Subjective Assessment - 08/30/20 1532    Subjective I went home over the weekend and missed 2 doses of Mobic.  Just came from work and have to go back.  6/10 pain today.    Patient Stated Goals not have surgery and decrease pain    Currently in Pain? Yes    Pain Score 6     Pain Location Heel    Pain Orientation Left;Posterior    Pain Descriptors / Indicators Burning    Pain Type Chronic pain    Pain Onset More than a month ago    Pain Frequency Constant    Aggravating Factors  being on feet, walking, using shoes too long vs boot    Pain Relieving Factors boot, ice, meds, elevation                             [facility] Adult PT Treatment/Exercise - 08/30/20 0001      Knee/Hip Exercises: Aerobic   Nustep L1 x 3.5 min before d/c due to pain      Knee/Hip Exercises: Seated   Other Seated Knee/Hip Exercises 1/2 foam roll  PF/DF/pronation/supination x 2' each way      Manual Therapy   Soft tissue mobilization Lt: achilles tendon, soleus, posterior tib, lateral gastroc      Ankle Exercises: Seated   Other Seated Ankle Exercises lacrosse ball tack and stretch in long sitting Lt gastroc and soleus with A/ROM Lt ankle DF reps x 2'    Other Seated Ankle Exercises ankle A/ROM DF/PF, circles, and PF w/ inv, DF w/ eversion x 10 each            Trigger Point Dry Needling - 08/30/20 0001    Consent Given? Yes    Education Handout Provided Previously provided    Muscles Treated Lower Quadrant Soleus    Dry Needling Comments Lt     Soleus Response Twitch response elicited;Palpable increased muscle length                  PT Short Term Goals - 08/30/20 1630      PT SHORT TERM GOAL #1   Title Pt will be independent with her initial HEP to increase ankle flexibility and decrease pain.  Status Achieved             PT Long Term Goals - 07/07/20 1512      PT LONG TERM GOAL #1   Title Pt will have atleast 15 deg of passive Lt ankle dorsiflexion to improve foot clearance during ambulation.    Baseline ---    Time 12    Period Weeks    Status New    Target Date 09/29/20      PT LONG TERM GOAL #2   Title Pt will be able to ambulate with adequate push off and step length x100 secondary to improved pain and gastroc strength.    Time 12    Period Weeks    Status New    Target Date 09/29/20      PT LONG TERM GOAL #3   Title Pt will be able to complete atleast 10 reps of single leg heel raise with or without pain to reflect improvements in gastroc strength.    Baseline --    Time 12    Period Weeks    Status New    Target Date 09/29/20      PT LONG TERM GOAL #4   Title Pt will be able to maintain single leg stance on the Lt for atleast 10 seconds, 2/3 trials, to reflect increase in ankle proprioception.    Baseline ---    Time 12    Period Weeks    Status New    Target Date 09/29/20       PT LONG TERM GOAL #5   Title FOTO </=46%    Baseline --    Time 12    Period Weeks    Status New    Target Date 09/29/20                 Plan - 08/30/20 1626    Clinical Impression Statement Pt consented to trying DN again but only tolerated single needle which was targeted at Lt lateral aspect of soleus.  Good twitch and release.  Pt and PT discussed how TPs in calf that when palpated cause heel pain may be a contributor to her experience of heel pain.  She chose STM vs needling after first needle today.  She has extensive trigger points and tension in Lt gastroc, soleus and posterior tib.  She was able to perform improved ROM of Lt ankle DF and seated slider stretch at end of session.  PT gave her tack and stretch HEP using lacrosse ball on Lt calf with ankle DF which gave her more local stretching without pain at heel/achilles.  Pt has upcoming MD appointment this month or next and will need to assess progress with PT at that time.    PT Frequency 2x / week    PT Duration 12 weeks    PT Treatment/Interventions ADLs/Self Care Home Management;Aquatic Therapy;Cryotherapy;Iontophoresis 4mg /ml Dexamethasone;Therapeutic activities;Therapeutic exercise;Balance training;Neuromuscular re-education;Manual techniques;Stair training;Gait training;Dry needling;Passive range of motion;Taping;Patient/family education;Vasopneumatic Device    PT Next Visit Plan f/u on tack and stretch with ankle DF in long sitting, cont STM/stetching as tol of Lt calf, progress ankle A/ROM and trial tband    PT Home Exercise Plan Access Code: [email or web address]    Consulted and Agree with Plan of Care Patient           Patient will benefit from skilled therapeutic intervention in order to improve the following deficits and impairments:     Visit Diagnosis: Pain in left foot  Difficulty in  walking, not elsewhere classified     Problem List Patient Active Problem List   Diagnosis Date Noted  . Achilles tendinitis  03/11/2020  . Ovarian cyst 02/19/2020  . Deliberate self-cutting 01/23/2020  . Non-suicidal self harm as coping mechanism 12/25/2019  . Diarrhea 06/13/2018  . Hypomagnesemia 06/13/2018  . Transaminitis 06/13/2018  . C. difficile diarrhea 06/12/2018  . Oral thrush 06/12/2018  . Suicidal ideation 04/12/2018  . Chest pain 03/28/2018  . Obesity 03/28/2018  . OSA (obstructive sleep apnea) 03/28/2018  . Acute cystitis 03/10/2018  . GERD (gastroesophageal reflux disease) 03/10/2018  . Depression with suicidal ideation 03/09/2018  . PTSD (post-traumatic stress disorder) 03/09/2018  . 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 Polycystic disease, ovaries 07/05/2016  . Abdominal pain, suprapubic 06/04/2016  . Spondylisthesis 04/20/2015  . Herniated lumbar intervertebral disc 02/28/2013  . Sciatica of left side 02/28/2013  . Borderline personality disorder (HCC) 12/29/2012  . Anxiety 12/24/2012  . COPD (chronic obstructive pulmonary disease) (HCC) 12/24/2012  . Essential hypertension 12/24/2012  . Low back pain 07/22/2012    Morton Peters, PT 08/30/20 4:31 PM   [place] [address] [place], [place], [address] Phone: [phone]   Fax:  [phone]  Name: Rebekah Davis MRN: [id number] Date of Birth: 05-14-1986

## 2020-09-06 ENCOUNTER — Other Ambulatory Visit: Payer: Self-pay

## 2020-09-06 ENCOUNTER — Encounter: Payer: Self-pay | Admitting: Physical Therapy

## 2020-09-06 ENCOUNTER — Ambulatory Visit: Payer: Medicare Other | Admitting: Physical Therapy

## 2020-09-06 DIAGNOSIS — M79672 Pain in left foot: Secondary | ICD-10-CM | POA: Diagnosis not present

## 2020-09-06 DIAGNOSIS — R262 Difficulty in walking, not elsewhere classified: Secondary | ICD-10-CM

## 2020-09-06 NOTE — Therapy (Signed)
[facility] [address] [place], [place], [address] Phone: [phone]   Fax:  [phone]  Physical Therapy Treatment  Patient Details  Name: Rebekah Davis MRN: [id number] Date of Birth: 1986-07-06 Referring Provider (PT): Dr. Nicki Guadalajara  Progress Note Reporting Period 07/07/20 to 09/06/20  See note below for Objective Data and Assessment of Progress/Goals.      Encounter Date: 09/06/2020   PT End of Session - 09/06/20 1056    Visit Number 10    Date for PT Re-Evaluation 09/29/20    Authorization Type Medicare/medicaid    Authorization Time Period 07/07/20-09/29/20    Authorization - Visit Number 10    Authorization - Number of Visits 10    PT Start Time 1015    PT Stop Time 1103    PT Time Calculation (min) 48 min    Activity Tolerance No increased pain;Patient tolerated treatment well    Behavior During Therapy [facility] for tasks assessed/performed           Past Medical History:  Diagnosis Date   Arthritis    Kidney stones    Migraine    PCOS (polycystic ovarian syndrome)     History reviewed. No pertinent surgical history.  There were no vitals filed for this visit.   Subjective Assessment - 09/06/20 1017    Subjective Burning around the heel, pain level 3/10.  It can go up to 8/10.  Easily reaches 6-7/10 if I don't sit down.  Still using the boot at work and I try to sit at work when I can.  I have not had any improvement in being able to do more since starting PT.    Limitations Walking;Standing    How long can you stand comfortably? with boot, 40 min    How long can you walk comfortably? without boot, 3 steps up to 2-3 min    Patient Stated Goals not have surgery and decrease pain    Currently in Pain? Yes    Pain Score 3     Pain Location Heel    Pain Orientation Left;Posterior    Pain Descriptors / Indicators Burning    Pain Type Chronic pain    Pain Radiating Towards wraps around  heel    Pain Onset More than a month ago    Pain Frequency Constant    Aggravating Factors  being on feet, walking, not using boot    Pain Relieving Factors boot, ice    Effect of Pain on Daily Activities pain at work, needs boot and seated breaks              [facility] PT Assessment - 09/06/20 0001      AROM   Right/Left Ankle Left    Left Ankle Dorsiflexion -4    Left Ankle Plantar Flexion 70      PROM   PROM Assessment Site Ankle    Right/Left Ankle Left    Left Ankle Dorsiflexion 0    Left Ankle Plantar Flexion 75      Strength   Overall Strength Comments 4/5 Lt ankle all cardinal planes, min pain with ankle PF                         [facility] Adult PT Treatment/Exercise - 09/06/20 0001      Exercises   Exercises Ankle      Modalities   Modalities Moist Heat      Moist  Heat Therapy   Number Minutes Moist Heat 8 Minutes    Moist Heat Location --   Lt calf     Manual Therapy   Soft tissue mobilization Lt soleus and gastroc    Passive ROM passive gastroc stretching after DN      Ankle Exercises: Seated   ABC's 1 rep    ABC's Limitations Left in long sitting    BAPS Sitting;Level 3    BAPS Limitations 4-way, then circles     Other Seated Ankle Exercises 1/2 foam roller 10 reps each: inversion/eversion/DF/PF Lt foot    Other Seated Ankle Exercises ball squeeze 5x5 sec, eversion yellow loop band 5x5 sec holds            Trigger Point Dry Needling - 09/06/20 0001    Consent Given? Yes    Muscles Treated Lower Quadrant Soleus;Gastrocnemius    Dry Needling Comments Lt, lateral gastroc    Gastrocnemius Response Twitch response elicited;Palpable increased muscle length    Soleus Response Twitch response elicited;Palpable increased muscle length                  PT Short Term Goals - 08/30/20 1630      PT SHORT TERM GOAL #1   Title Pt will be independent with her initial HEP to increase ankle flexibility and decrease pain.    Status  Achieved             PT Long Term Goals - 07/07/20 1512      PT LONG TERM GOAL #1   Title Pt will have atleast 15 deg of passive Lt ankle dorsiflexion to improve foot clearance during ambulation.    Baseline ---    Time 12    Period Weeks    Status New    Target Date 09/29/20      PT LONG TERM GOAL #2   Title Pt will be able to ambulate with adequate push off and step length x100 secondary to improved pain and gastroc strength.    Time 12    Period Weeks    Status New    Target Date 09/29/20      PT LONG TERM GOAL #3   Title Pt will be able to complete atleast 10 reps of single leg heel raise with or without pain to reflect improvements in gastroc strength.    Baseline --    Time 12    Period Weeks    Status New    Target Date 09/29/20      PT LONG TERM GOAL #4   Title Pt will be able to maintain single leg stance on the Lt for atleast 10 seconds, 2/3 trials, to reflect increase in ankle proprioception.    Baseline ---    Time 12    Period Weeks    Status New    Target Date 09/29/20      PT LONG TERM GOAL #5   Title FOTO </=46%    Baseline --    Time 12    Period Weeks    Status New    Target Date 09/29/20                 Plan - 09/06/20 1057    Clinical Impression Statement Pt continues to be very limited in standing and walking tolerance. She wears Lt boot full time at work due to pain and often needs to sit to reduce pain/burning in Lt heel.  She continues to have active  TPs in soleus and lateral gastroc which she has recently consented to DN which have helped with ankle DF range and reduced achilles pain with efforts to stretch calf musculature.  Lt ankle DF improved from 0 to 5 deg after DN today.  Pt meets with surgeon tomorrow and will contact our clinic with a decision/outcome from her MD appointment.    Rehab Potential Good    PT Frequency 2x / week    PT Duration 12 weeks    PT Treatment/Interventions ADLs/Self Care Home Management;Aquatic  Therapy;Cryotherapy;Iontophoresis 4mg /ml Dexamethasone;Therapeutic activities;Therapeutic exercise;Balance training;Neuromuscular re-education;Manual techniques;Stair training;Gait training;Dry needling;Passive range of motion;Taping;Patient/family education;Vasopneumatic Device    PT Next Visit Plan f/u on MD visit, continue DN if Pt consents, continue Lt ankle stretching/ROM/strength as tol    PT Home Exercise Plan Access Code: [email or web address]    Consulted and Agree with Plan of Care Patient           Patient will benefit from skilled therapeutic intervention in order to improve the following deficits and impairments:     Visit Diagnosis: Pain in left foot  Difficulty in walking, not elsewhere classified     Problem List Patient Active Problem List   Diagnosis Date Noted   Achilles tendinitis 03/11/2020   Ovarian cyst 02/19/2020   Deliberate self-cutting 01/23/2020   Non-suicidal self harm as coping mechanism 12/25/2019   Diarrhea 06/13/2018   Hypomagnesemia 06/13/2018   Transaminitis 06/13/2018   C. difficile diarrhea 06/12/2018   Oral thrush 06/12/2018   Suicidal ideation 04/12/2018   Chest pain 03/28/2018   Obesity 03/28/2018   OSA (obstructive sleep apnea) 03/28/2018   Acute cystitis 03/10/2018   GERD (gastroesophageal reflux disease) 03/10/2018   Depression with suicidal ideation 03/09/2018   PTSD (post-traumatic stress disorder) 03/09/2018   Major depressive disorder, recurrent severe without psychotic features (HCC) 02/27/2018   Polycystic disease, ovaries 07/05/2016   Abdominal pain, suprapubic 06/04/2016   Spondylisthesis 04/20/2015   Herniated lumbar intervertebral disc 02/28/2013   Sciatica of left side 02/28/2013   Borderline personality disorder (HCC) 12/29/2012   Anxiety 12/24/2012   COPD (chronic obstructive pulmonary disease) (HCC) 12/24/2012   Essential hypertension 12/24/2012   Low back pain 07/22/2012    Glessie Eustice,  PT 09/06/20 11:01 AM   [facility] [address] [place], [place], [place] Phone: [phone]   Fax:  [phone]  Name: Rebekah Davis MRN: [id number] Kail Date of Birth: [birth year]

## 2020-09-13 ENCOUNTER — Encounter: Payer: Medicare Other | Admitting: Physical Therapy

## 2020-09-14 ENCOUNTER — Ambulatory Visit (INDEPENDENT_AMBULATORY_CARE_PROVIDER_SITE_OTHER): Payer: Medicare Other | Admitting: Nurse Practitioner

## 2020-09-14 ENCOUNTER — Encounter: Payer: Self-pay | Admitting: Nurse Practitioner

## 2020-09-14 ENCOUNTER — Other Ambulatory Visit: Payer: Self-pay

## 2020-09-14 VITALS — BP 167/85 | HR 111 | Temp 98.3°F | Resp 20 | Ht 65.5 in | Wt 329.4 lb

## 2020-09-14 DIAGNOSIS — Z1389 Encounter for screening for other disorder: Secondary | ICD-10-CM | POA: Diagnosis not present

## 2020-09-14 DIAGNOSIS — Z131 Encounter for screening for diabetes mellitus: Secondary | ICD-10-CM

## 2020-09-14 DIAGNOSIS — F419 Anxiety disorder, unspecified: Secondary | ICD-10-CM

## 2020-09-14 DIAGNOSIS — R42 Dizziness and giddiness: Secondary | ICD-10-CM

## 2020-09-14 DIAGNOSIS — F332 Major depressive disorder, recurrent severe without psychotic features: Secondary | ICD-10-CM

## 2020-09-14 DIAGNOSIS — Z0000 Encounter for general adult medical examination without abnormal findings: Secondary | ICD-10-CM

## 2020-09-14 DIAGNOSIS — Z8639 Personal history of other endocrine, nutritional and metabolic disease: Secondary | ICD-10-CM | POA: Diagnosis not present

## 2020-09-14 DIAGNOSIS — E671 Hypercarotinemia: Secondary | ICD-10-CM

## 2020-09-14 MED ORDER — ESCITALOPRAM OXALATE 10 MG PO TABS
10.0000 mg | ORAL_TABLET | Freq: Two times a day (BID) | ORAL | 0 refills | Status: DC
Start: 2020-09-14 — End: 2020-12-22

## 2020-09-14 MED ORDER — ARIPIPRAZOLE 20 MG PO TABS: 20.0000 mg | ORAL_TABLET | Freq: Every day | ORAL | 11 refills | Status: DC

## 2020-09-14 MED ORDER — ESCITALOPRAM OXALATE 10 MG PO TABS
10.0000 mg | ORAL_TABLET | Freq: Every day | ORAL | 11 refills | Status: DC
Start: 2020-09-14 — End: 2020-09-14

## 2020-09-14 NOTE — Progress Notes (Signed)
Needs surgical clearance for [facility], surgery on 10/04/20. Call placed to request form  Needs RFs on all meds   Reports very high/worsening anxiety, particularly r/t upcoming surgery, also out of meds   Reports had pap in ER in July, believes result was normal

## 2020-09-14 NOTE — Patient Instructions (Signed)

## 2020-09-14 NOTE — Progress Notes (Signed)
[facility] Patient [facility] [address] [place], [place]  [address] Phone:  [phone]   Fax:  [phone]   Established Patient Office Visit  Subjective:  Patient ID: Rebekah Davis, female    DOB: [birth year]  Age: 33 y.o. MRN: [id number]  CC:  Chief Complaint  Patient presents with  . Follow-up    HPI Rebekah Davis presents for follow up. She  has a past medical history of Arthritis, Kidney stones, Migraine, and PCOS (polycystic ovarian syndrome).   Anxiety Patient is here for evaluation of anxiety.  She has the following anxiety symptoms: difficulty concentrating, dizziness, insomnia, palpitations, racing thoughts. Onset of symptoms was approximately several weeks ago.  Symptoms have been gradually worsening since that time. She denies current suicidal and homicidal ideation. Possible organic causes contributing are: drug abuse. Risk factors: up coming surgery Previous treatment includes individual therapy, group therapy and medication Lexapro.   She complains of the following medication side effects: none. She has increased anxiety about her upcoming surgery . She has not tolerated anesthesia well in the past. She is fearful of not getting what she needs.    Past Medical History:  Diagnosis Date  . Arthritis   . Kidney stones   . Migraine   . PCOS (polycystic ovarian syndrome)     History reviewed. No pertinent surgical history.  Family History  Problem Relation Age of Onset  . Hypertension Mother   . Diabetes Mother   . Kidney disease Mother   . Depression Mother   . Hypertension Father   . Diabetes Father     Social History   Socioeconomic History  . Marital status: Single    Spouse name: Not on file  . Number of children: Not on file  . Years of education: Not on file  . Highest education level: Not on file  Occupational History  . Not on file  Tobacco Use  . Smoking status: Former Games developer  . Smokeless tobacco: Never Used  Substance and Sexual  Activity  . Alcohol use: Not Currently  . Drug use: Not Currently  . Sexual activity: Not on file  Other Topics Concern  . Not on file  Social History Narrative  . Not on file   Social Determinants of Health   Financial Resource Strain:   . Difficulty of Paying Living Expenses: Not on file  Food Insecurity:   . Worried About Programme researcher, broadcasting/film/video in the Last Year: Not on file  . Ran Out of Food in the Last Year: Not on file  Transportation Needs:   . Lack of Transportation (Medical): Not on file  . Lack of Transportation (Non-Medical): Not on file  Physical Activity:   . Days of Exercise per Week: Not on file  . Minutes of Exercise per Session: Not on file  Stress:   . Feeling of Stress : Not on file  Social Connections:   . Frequency of Communication with Friends and Family: Not on file  . Frequency of Social Gatherings with Friends and Family: Not on file  . Attends Religious Services: Not on file  . Active Member of Clubs or Organizations: Not on file  . Attends Banker Meetings: Not on file  . Marital Status: Not on file  Intimate Partner Violence:   . Fear of Current or Ex-Partner: Not on file  . Emotionally Abused: Not on file  . Physically Abused: Not on file  . Sexually Abused: Not on file  Outpatient Medications Prior to Visit  Medication Sig Dispense Refill  . acetaminophen (TYLENOL) 325 MG tablet Take 650 mg by mouth daily as needed for pain.    . meloxicam (MOBIC) 15 MG tablet Take 15 mg by mouth daily.    Marland Kitchen escitalopram (LEXAPRO) 5 MG tablet TAKE 1 TABLET(5 MG) BY MOUTH DAILY 30 tablet 2  . ARIPiprazole (ABILIFY) 20 MG tablet Take 1 tablet (20 mg total) by mouth daily. 30 tablet 2  . escitalopram (LEXAPRO) 10 MG tablet Take 1 tablet (10 mg total) by mouth daily. Taking with 5mg  = 15mg  total daily 30 tablet 2   No facility-administered medications prior to visit.    Allergies  Allergen Reactions  . Adhesive [Tape] Other (See Comments)     blister  . Cherry Anaphylaxis and Rash  . Other Hives and Rash  . Sulfamethoxazole-Trimethoprim Hives  . Levofloxacin     Other reaction(s): Muscle Pain, Other (See Comments)  . Morphine     Other reaction(s): Other (See Comments) Feels like unable to breath or swallow   . Gabapentin Rash  . Metformin Nausea And Vomiting    Other reaction(s): Other (See Comments) diaphoresis   . Sumatriptan Nausea And Vomiting    Other reaction(s): Other (See Comments) Decreased heart rate and respiratory rate Decreased heart rate and respiratory rate     ROS Review of Systems  Constitutional: Positive for unexpected weight change.  Neurological: Positive for dizziness.      Objective:    Physical Exam Constitutional:      Appearance: She is obese.  HENT:     Head: Normocephalic and atraumatic.  Cardiovascular:     Rate and Rhythm: Normal rate and regular rhythm.     Pulses: Normal pulses.     Heart sounds: Normal heart sounds.  Pulmonary:     Effort: Pulmonary effort is normal.     Breath sounds: Normal breath sounds.  Abdominal:     General: Bowel sounds are normal.     Palpations: Abdomen is soft.  Musculoskeletal:     Cervical back: Normal range of motion.     Comments: Boot patent  Skin:    General: Skin is warm and dry.     Capillary Refill: Capillary refill takes less than 2 seconds.  Neurological:     General: No focal deficit present.     Mental Status: She is alert.  Psychiatric:     Comments: anxious and tearful during the visit     BP (!) 167/85 (BP Location: Left Arm, Patient Position: Sitting, Cuff Size: Large)   Pulse (!) 111   Temp 98.3 F (36.8 C) (Temporal)   Resp 20   Ht 5' 5.5" (1.664 m)   Wt (!) 329 lb 6.4 oz (149.4 kg)   SpO2 100%   BMI 53.98 kg/m  Wt Readings from Last 3 Encounters:  09/14/20 (!) 329 lb 6.4 oz (149.4 kg)  03/11/20 289 lb (131.1 kg)  02/09/20 268 lb (121.6 kg)     Health Maintenance Due  Topic Date Due  . PAP  SMEAR-Modifier  Never done    There are no preventive care reminders to display for this patient.  Lab Results  Component Value Date   TSH 1.380 03/11/2020   Lab Results  Component Value Date   WBC 10.5 03/25/2020   HGB 14.9 03/25/2020   HCT 45.6 03/25/2020   MCV 95.8 03/25/2020   PLT 246 03/25/2020   Lab Results  Component Value Date  NA 144 03/25/2020   K 4.2 03/25/2020   CO2 28 03/25/2020   GLUCOSE 101 (H) 03/25/2020   BUN 17 03/25/2020   CREATININE 0.95 03/25/2020   BILITOT 0.3 03/11/2020   ALKPHOS 77 03/11/2020   AST 24 03/11/2020   ALT 22 02/09/2020   PROT 6.4 03/11/2020   ALBUMIN 4.3 03/11/2020   CALCIUM 9.1 03/25/2020   ANIONGAP 10 03/25/2020   No results found for: CHOL No results found for: HDL No results found for: LDLCALC No results found for: TRIG No results found for: CHOLHDL No results found for: XHBZ1I    Assessment & Plan:   Problem List Items Addressed This Visit      Other   Major depressive disorder, recurrent severe without psychotic features (HCC) 2 weeks follow-up for re evaluation of Lexapro   Relevant Medications   escitalopram (LEXAPRO) 10 MG tablet   Other Relevant Orders   Ambulatory referral to Psychiatry   Anxiety Increase Lexapro 20 mg (10 mg twice daily per patient preference)   Relevant Medications   escitalopram (LEXAPRO) 10 MG tablet   Other Relevant Orders   Ambulatory referral to Psychiatry    Other Visit Diagnoses    Dizziness    -  Primary   Screening for diabetes mellitus (DM)       Healthcare maintenance       Screening for obesity       Personal history of other endocrine, nutritional and metabolic disease       Relevant Orders   POCT glycosylated hemoglobin (Hb A1C)   POCT glucose (manual entry)   Hypercarotenemia        Relevant Orders   POCT glucose (manual entry)      Meds ordered this encounter  Medications  . DISCONTD: escitalopram (LEXAPRO) 10 MG tablet    Sig: Take 1 tablet (10 mg total)  by mouth daily.    Dispense:  30 tablet    Refill:  11    Order Specific Question:   Supervising Provider    Answer:   Quentin Angst [id number]  . escitalopram (LEXAPRO) 10 MG tablet    Sig: Take 1 tablet (10 mg total) by mouth 2 (two) times daily.    Dispense:  60 tablet    Refill:  0    Patient preference to have 2 tabs verses 20mg  tab.    Order Specific Question:   Supervising Provider    Answer:   Quentin Angst  . ARIPiprazole (ABILIFY) 20 MG tablet    Sig: Take 1 tablet (20 mg total) by mouth daily.    Dispense:  30 tablet    Refill:  11    Order Specific Question:   Supervising Provider    Answer:   [id number] Quentin Angst    Follow-up: Return in about 2 weeks (around 09/28/2020) for Virtual apt thanks.    09/30/2020, NP

## 2020-09-18 ENCOUNTER — Other Ambulatory Visit: Payer: Self-pay | Admitting: Nurse Practitioner

## 2020-09-19 LAB — POCT GLYCOSYLATED HEMOGLOBIN (HGB A1C): Hemoglobin A1C: 4.8 % (ref 4.0–5.6)

## 2020-09-20 ENCOUNTER — Other Ambulatory Visit: Payer: Self-pay

## 2020-09-20 ENCOUNTER — Ambulatory Visit: Payer: Medicare Other | Attending: Orthopaedic Surgery | Admitting: Physical Therapy

## 2020-09-20 ENCOUNTER — Encounter: Payer: Self-pay | Admitting: Physical Therapy

## 2020-09-20 DIAGNOSIS — R262 Difficulty in walking, not elsewhere classified: Secondary | ICD-10-CM | POA: Diagnosis present

## 2020-09-20 DIAGNOSIS — M79672 Pain in left foot: Secondary | ICD-10-CM | POA: Diagnosis not present

## 2020-09-20 NOTE — Therapy (Signed)
[facility] [address] [place], [place], [address] Phone: [phone]   Fax:  [phone]  Physical Therapy Treatment  Patient Details  Name: Rebekah Davis MRN: [id number] Date of Birth: 1986-06-15 Referring Provider (PT): Dr. Nicki Guadalajara   Encounter Date: 09/20/2020   PT End of Session - 09/20/20 1530    Visit Number 11    Date for PT Re-Evaluation 09/29/20    Authorization Type Medicare/medicaid    Authorization Time Period 07/07/20-09/29/20    Authorization - Visit Number 11    Authorization - Number of Visits 20    PT Start Time 1445    PT Stop Time 1530    PT Time Calculation (min) 45 min    Activity Tolerance No increased pain;Patient tolerated treatment well    Behavior During Therapy [facility] for tasks assessed/performed           Past Medical History:  Diagnosis Date  . Arthritis   . Kidney stones   . Migraine   . PCOS (polycystic ovarian syndrome)     History reviewed. No pertinent surgical history.  There were no vitals filed for this visit.   Subjective Assessment - 09/20/20 1448    Subjective I am having surgery on 10/04/20. I need to learn how to walk with crutches and knee scooter b/c I will be NWB after surgery x 6-10 weeks.  Pt has new Rx to learn this and upper body strength.    Limitations Walking;Standing    How long can you stand comfortably? with boot, 40 min    How long can you walk comfortably? without boot, 3 steps up to 2-3 min    Patient Stated Goals not have surgery and decrease pain    Currently in Pain? Yes    Pain Score 5     Pain Location Ankle    Pain Orientation Left    Pain Descriptors / Indicators Burning    Pain Type Chronic pain    Pain Onset More than a month ago    Pain Frequency Constant    Aggravating Factors  being on feet, walking, not using boot    Pain Relieving Factors rest, ice, meds    Effect of Pain on Daily Activities pain at work                              [facility] Adult PT Treatment/Exercise - 09/20/20 0001      Ambulation/Gait   Ambulation Distance (Feet) 40 Feet    Assistive device Crutches    Gait Pattern Step-through pattern    Stairs Yes    Stairs Assistance Other (comment)    Stair Management Technique Step to pattern;With crutches    Number of Stairs 4    Height of Stairs 6    Gait Comments practice Lt NWB for upcoming surgery      Exercises   Exercises Shoulder      Knee/Hip Exercises: Seated   Knee/Hip Flexion knee flexion Rt green tied tband x 10      Knee/Hip Exercises: Prone   Hip Extension AROM;Right;10 reps      Shoulder Exercises: Standing   Extension Strengthening;Both;10 reps;Theraband    Theraband Level (Shoulder Extension) Level 4 (Blue)    Row Strengthening;Both;10 reps;Theraband    Theraband Level (Shoulder Row) Other (comment)    Row Limitations black tband    Other Standing Exercises elbow ext blue x10 bil  PT Short Term Goals - 08/30/20 1630      PT SHORT TERM GOAL #1   Title Pt will be independent with her initial HEP to increase ankle flexibility and decrease pain.    Status Achieved             PT Long Term Goals - 09/20/20 1708      Additional Long Term Goals   Additional Long Term Goals Yes      PT LONG TERM GOAL #6   Title Pt will demo proper use of bil axillary crutches and knee scooter for ambulation and stairs for Lt LE NWB status following upcoming surgery    Time 2    Period Weeks    Status New    Target Date 10/04/20                 Plan - 09/20/20 1703    Clinical Impression Statement Pt is moving forward with surgery on 10/04/20.  She brought crutches to practice NWB ambulation and stairs.  PT instructed and practiced this with her with close CGA for stairs.  She was able to demo 2 rounds of ascending and descending 6 inch stairs x 4 but needed CGA.  PT updated HEP to include relevant UE strengthening for prep  for crutches and knee scooter use, as well as single leg sit to stand and hip ext and knee flexion strength for knee scooter prep.  PT encouraged Pt to contact her MD regarding possible home health PT for negotiating gravel driveway and stairs without rail post-surgically.  Pt will bring knee scooter next visit to review use.    Rehab Potential Good    PT Frequency 2x / week    PT Duration 12 weeks    PT Treatment/Interventions ADLs/Self Care Home Management;Aquatic Therapy;Cryotherapy;Iontophoresis 4mg /ml Dexamethasone;Therapeutic activities;Therapeutic exercise;Balance training;Neuromuscular re-education;Manual techniques;Stair training;Gait training;Dry needling;Passive range of motion;Taping;Patient/family education;Vasopneumatic Device    PT Next Visit Plan review crutches for stairs and NWB ambulation, review HEP for UE/LE, and instruct knee scooter use, d/c for surgery 10/04/20    PT Home Exercise Plan Access Code: [email or web address]    Consulted and Agree with Plan of Care Patient           Patient will benefit from skilled therapeutic intervention in order to improve the following deficits and impairments:     Visit Diagnosis: Pain in left foot  Difficulty in walking, not elsewhere classified     Problem List Patient Active Problem List   Diagnosis Date Noted  . Achilles tendinitis 03/11/2020  . Ovarian cyst 02/19/2020  . Deliberate self-cutting 01/23/2020  . Non-suicidal self harm as coping mechanism 12/25/2019  . Diarrhea 06/13/2018  . Hypomagnesemia 06/13/2018  . Transaminitis 06/13/2018  . C. difficile diarrhea 06/12/2018  . Oral thrush 06/12/2018  . Suicidal ideation 04/12/2018  . Chest pain 03/28/2018  . Obesity 03/28/2018  . OSA (obstructive sleep apnea) 03/28/2018  . Acute cystitis 03/10/2018  . GERD (gastroesophageal reflux disease) 03/10/2018  . Depression with suicidal ideation 03/09/2018  . PTSD (post-traumatic stress disorder) 03/09/2018  . Major depressive  disorder, recurrent severe without psychotic features (HCC) 02/27/2018  . Polycystic disease, ovaries 07/05/2016  . Abdominal pain, suprapubic 06/04/2016  . Spondylisthesis 04/20/2015  . Herniated lumbar intervertebral disc 02/28/2013  . Sciatica of left side 02/28/2013  . Borderline personality disorder (HCC) 12/29/2012  . Anxiety 12/24/2012  . COPD (chronic obstructive pulmonary disease) (HCC) 12/24/2012  . Essential hypertension 12/24/2012  . Low back pain 07/22/2012  Loistine Simas Connee Ikner, PT 09/20/20 5:10 PM    [facility] [address] [place], [place], [address] Phone: [phone]   Fax:  [phone]  Name: Rebekah Davis MRN: [id number] Date of Birth: 1986-03-02

## 2020-09-21 NOTE — Telephone Encounter (Signed)
Refill request

## 2020-09-27 ENCOUNTER — Other Ambulatory Visit: Payer: Self-pay

## 2020-09-27 ENCOUNTER — Encounter: Payer: Self-pay | Admitting: Physical Therapy

## 2020-09-27 ENCOUNTER — Ambulatory Visit: Payer: Medicare Other | Admitting: Physical Therapy

## 2020-09-27 DIAGNOSIS — M79672 Pain in left foot: Secondary | ICD-10-CM | POA: Diagnosis not present

## 2020-09-27 DIAGNOSIS — R262 Difficulty in walking, not elsewhere classified: Secondary | ICD-10-CM

## 2020-09-27 NOTE — Patient Instructions (Signed)
Questions to ask MD/surgeon pre-op:  1) large rock gravel driveway - concerns about using crutches and knee scooter for home access 2) 4 stairs with single wobbly railing - need close guarding for safety per my physical therapist to do stairs with crutches 3) physically unable to do stairs sitting and scooting 4) how do I safely access my home and community once I go home after surgery?  Do I have options for support to be ordered to come to my home to help me?

## 2020-09-27 NOTE — Therapy (Signed)
[place] [facility] °[address], [address] °[place], [place], [address] °Phone: [phone]   Fax:  [phone] ° °Physical Therapy Treatment ° °Patient Details  °Name: Rebekah Davis °MRN: [id number] °Date of Birth: 11/08/1985 °Referring Provider (PT): Dr. Chris Adair ° ° °Encounter Date: 09/27/2020 ° ° PT End of Session - 09/27/20 1445   ° Visit Number 12   ° Date for PT Re-Evaluation 09/29/20   ° Authorization Type Medicare/medicaid   ° Authorization Time Period 07/07/20-09/29/20   ° Authorization - Visit Number 12   ° Authorization - Number of Visits 20   ° PT Start Time 1445   ° PT Stop Time 1530   ° PT Time Calculation (min) 45 min   ° Activity Tolerance No increased pain;Patient tolerated treatment well   ° Behavior During Therapy WFL for tasks assessed/performed   °  °  °  ° ° °Past Medical History:  °Diagnosis Date  °• Arthritis   °• Kidney stones   °• Migraine   °• PCOS (polycystic ovarian syndrome)   ° ° °History reviewed. No pertinent surgical history. ° °There were no vitals filed for this visit. ° ° Subjective Assessment - 09/27/20 1445   ° Subjective I am able to do single leg sit to stand and stand to sit well.  I am getting better on my crutches but having a hard time with stairs using the crutches.  I fell into the doorframe attempting it.  I'm doing the upper body and leg exercises.   ° Limitations Walking;Standing   ° How long can you stand comfortably? with boot, 40 min   ° How long can you walk comfortably? without boot, 3 steps up to 2-3 min   ° Patient Stated Goals not have surgery and decrease pain   ° Currently in Pain? Yes   ° Pain Score 5    ° Pain Location Heel   ° Pain Orientation Left;Posterior   ° Pain Descriptors / Indicators Burning   ° Pain Type Chronic pain   ° Pain Onset More than a month ago   ° Pain Frequency Constant   °  °  °  ° ° ° ° ° ° ° ° ° ° ° ° ° ° ° ° ° ° ° ° [facility] Adult PT Treatment/Exercise - 09/27/20 0001   °  °  Ambulation/Gait  ° Ambulation Distance (Feet) 40 Feet   ° Assistive device Crutches   ° Gait Pattern Step-through pattern   ° Stairs Yes   ° Stairs Assistance Other (comment)   ° Stair Management Technique Step to pattern;With crutches   ° Number of Stairs 4   ° Height of Stairs 6   ° Pre-Gait Activities Rt LE single leg sit to stand, stand to sit with crutches management x 5 reps   ° Gait Comments Lt NWB for upcoming surgery.  Pt needed close supervision and intermittent CGA for safety with ascending and descending stairs with crutches, Pt unable to physically get down and up from floor to do stairs in sitting.  PT set up and instructed Pt with Lt LE NWB knee scooter use 40 feet x 3 laps   °  ° Self-Care  ° Self-Care Other Self-Care Comments   ° Other Self-Care Comments  questions to ask surgeon before surgery about home access, PT left message for surgeon regarding concerns about home access post-op given Pt's home environment and NWB status   °  °  °  ° ° ° ° ° ° ° ° °   PT Education - 09/27/20 1524   ° Education Details reviewed safety and questions to ask surgeon - PT called and left message for surgeon during session today   ° Person(s) Educated Patient   ° Methods Explanation;Handout   ° Comprehension Verbalized understanding   °  °  °  ° ° ° PT Short Term Goals - 08/30/20 1630   °  ° PT SHORT TERM GOAL #1  ° Title Pt will be independent with her initial HEP to increase ankle flexibility and decrease pain.   ° Status Achieved   °  °  °  ° ° ° ° PT Long Term Goals - 09/27/20 1727   °  ° PT LONG TERM GOAL #1  ° Title Pt will have atleast 15 deg of passive Lt ankle dorsiflexion to improve foot clearance during ambulation.   ° Status Deferred   °  ° PT LONG TERM GOAL #2  ° Title Pt will be able to ambulate with adequate push off and step length x100 secondary to improved pain and gastroc strength.   ° Status Deferred   °  ° PT LONG TERM GOAL #3  ° Title Pt will be able to complete atleast 10 reps of single leg heel  raise with or without pain to reflect improvements in gastroc strength.   ° Status Deferred   °  ° PT LONG TERM GOAL #4  ° Title Pt will be able to maintain single leg stance on the Lt for atleast 10 seconds, 2/3 trials, to reflect increase in ankle proprioception.   ° Status Deferred   °  ° PT LONG TERM GOAL #5  ° Title FOTO </=46%   ° Status Deferred   °  ° PT LONG TERM GOAL #6  ° Title Pt will demo proper use of bil axillary crutches and knee scooter for ambulation and stairs for Lt LE NWB status following upcoming surgery   ° Baseline needs CGA/close supervision for crutches on stairs   ° Status Partially Met   °  °  °  ° ° ° ° ° ° ° ° Plan - 09/27/20 1728   ° Clinical Impression Statement Pt did not contact surgeon after last visit with concerns about home access following surgery.  She has a large rock gravel driveway and 4 stairs with a single wobbly hand rail.  She doesn't feel she will be able to use crutches or knee scooter on gravel driveway.  She needed close CGA for safety on stairs with crutches.  She is physically unable to get down to ground safely to do stairs seated.  PT called surgeon and left message and encouraged Pt to follow up before surgery next week to discuss options.  PT reviewed and practiced gait with crutches, knee scooter, transfers using Rt LE only and HEP.  Pt will be d/c'd at this time due to upcoming surgery next week.   ° Rehab Potential Good   ° PT Frequency 2x / week   ° PT Duration 12 weeks   ° PT Treatment/Interventions ADLs/Self Care Home Management;Aquatic Therapy;Cryotherapy;Iontophoresis 4mg/ml Dexamethasone;Therapeutic activities;Therapeutic exercise;Balance training;Neuromuscular re-education;Manual techniques;Stair training;Gait training;Dry needling;Passive range of motion;Taping;Patient/family education;Vasopneumatic Device   ° PT Next Visit Plan d/c to HEP, surgery 10/04/20   ° PT Home Exercise Plan Access Code: [email or web address]   ° Consulted and Agree with Plan of Care  Patient   °  °  °  ° ° °Patient will benefit from skilled therapeutic intervention in order to   improve the following deficits and impairments:    ° °Visit Diagnosis: °Pain in left foot ° °Difficulty in walking, not elsewhere classified ° ° ° ° °Problem List °Patient Active Problem List  ° Diagnosis Date Noted  °• Achilles tendinitis 03/11/2020  °• Ovarian cyst 02/19/2020  °• Deliberate self-cutting 01/23/2020  °• Non-suicidal self harm as coping mechanism 12/25/2019  °• Diarrhea 06/13/2018  °• Hypomagnesemia 06/13/2018  °• Transaminitis 06/13/2018  °• C. difficile diarrhea 06/12/2018  °• Oral thrush 06/12/2018  °• Suicidal ideation 04/12/2018  °• Chest pain 03/28/2018  °• Obesity 03/28/2018  °• OSA (obstructive sleep apnea) 03/28/2018  °• Acute cystitis 03/10/2018  °• GERD (gastroesophageal reflux disease) 03/10/2018  °• Depression with suicidal ideation 03/09/2018  °• PTSD (post-traumatic stress disorder) 03/09/2018  °• Major depressive disorder, recurrent severe without psychotic features (HCC) 02/27/2018  °• Polycystic disease, ovaries 07/05/2016  °• Abdominal pain, suprapubic 06/04/2016  °• Spondylisthesis 04/20/2015  °• Herniated lumbar intervertebral disc 02/28/2013  °• Sciatica of left side 02/28/2013  °• Borderline personality disorder (HCC) 12/29/2012  °• Anxiety 12/24/2012  °• COPD (chronic obstructive pulmonary disease) (HCC) 12/24/2012  °• Essential hypertension 12/24/2012  °• Low back pain 07/22/2012  ° ° °PHYSICAL THERAPY DISCHARGE SUMMARY ° °Visits from Start of Care: 12 ° °Current functional level related to goals / functional outcomes: °See above. °  °Remaining deficits: °See above.  Pt having surgery. °  °Education / Equipment: °Axillary crutches, knee scooter, HEP °Plan: °Patient agrees to discharge.  Patient goals were not met. Patient is being discharged due to the physician's request.  ?????  ° ° ° ° , PT °09/27/20 5:33 PM ° °[place] °[facility] °[address], [address] °[place], [place], [address] °Phone: [phone]   Fax:  [phone] ° °Name: Rebekah Davis °MRN: [id number] °Date of Birth: 04/11/1986 ° ° °

## 2020-09-28 ENCOUNTER — Telehealth (INDEPENDENT_AMBULATORY_CARE_PROVIDER_SITE_OTHER): Payer: Medicare Other | Admitting: Nurse Practitioner

## 2020-09-28 NOTE — Progress Notes (Signed)
   Rebekah Davis Patient [facility] [address] Rebekah Davis [place], [place]  [address] Phone:  [phone]   Fax:  [phone]  Virtual Visit via Telephone Note  I connected with Rebekah Davis on 09/28/20 at  1:50 PM EST by telephone and verified that I am speaking with the correct person using two identifiers.  Location: Patient: Not available spoke with Rebekah Davis, program director Provider: office   I discussed the limitations, risks, security and privacy concerns of performing an evaluation and management service by telephone and the availability of in person appointments. I also discussed with the patient that there may be a patient responsible charge related to this service. The patient expressed understanding and agreed to proceed.   History of Present Illness:  Anxiety Rebekah Davis had an apt to follow up for her  anxiety.  She had an unexpected visitor take on an outing. I spoke with Rebekah Davis in reference to a am Diazepam before her up coming surgery . She admits that this would be okay and allowed for the surgery day due to her history of problems with anesthesia.   Observations/Objective: No exam   Assessment and Plan: Anxiety increased due to up coming surgery.  Follow Up Instructions: Will have patient set up another apt for a virtual visit prior to surgery to discuss current condition   I discussed the assessment and treatment plan with the patient. The patient was provided an opportunity to ask questions and all were answered. The patient agreed with the plan and demonstrated an understanding of the instructions.   The patient was advised to call back or seek an in-person evaluation if the symptoms worsen or if the condition fails to improve as anticipated.  I provided 5 minutes of non-face-to-face time during this encounter.   Barbette Merino, NP

## 2020-09-30 ENCOUNTER — Other Ambulatory Visit: Payer: Self-pay | Admitting: Orthopaedic Surgery

## 2020-10-03 ENCOUNTER — Other Ambulatory Visit (HOSPITAL_COMMUNITY)
Admission: RE | Admit: 2020-10-03 | Discharge: 2020-10-03 | Disposition: A | Discharge status: Home / Self Care | Payer: Medicare Other | Source: Ambulatory Visit | Attending: Orthopaedic Surgery | Admitting: Orthopaedic Surgery

## 2020-10-03 ENCOUNTER — Encounter (HOSPITAL_COMMUNITY): Payer: Self-pay | Admitting: Orthopaedic Surgery

## 2020-10-03 DIAGNOSIS — Z20822 Contact with and (suspected) exposure to covid-19: Secondary | ICD-10-CM | POA: Insufficient documentation

## 2020-10-03 DIAGNOSIS — Z01812 Encounter for preprocedural laboratory examination: Secondary | ICD-10-CM | POA: Diagnosis present

## 2020-10-03 LAB — SARS CORONAVIRUS 2 (TAT 6-24 HRS): SARS Coronavirus 2: NEGATIVE

## 2020-10-03 NOTE — Progress Notes (Signed)
PCP:  Denies Cardiologist:  Denies   EKG:  N/A CXR:  N/A ECHO:  Denies Stress Test:  Denies Cardiac Cath:  Denies  Fasting Blood Sugar-  NA Checks Blood Sugar_NA__ times a day  OSA/CPAP:  No  ASA/Blood Thinners:  No  Covid test results pending  Anesthesia Review:  No  Patient denies shortness of breath, fever, cough, and chest pain at PAT appointment.  Patient verbalized understanding of instructions provided today at the PAT appointment.  Patient asked to review instructions at home and day of surgery.

## 2020-10-04 ENCOUNTER — Ambulatory Visit (HOSPITAL_COMMUNITY): Payer: Medicare Other | Admitting: Certified Registered Nurse Anesthetist

## 2020-10-04 ENCOUNTER — Encounter (HOSPITAL_COMMUNITY)
Admission: RE | Disposition: A | Discharge status: Home / Self Care | Payer: Self-pay | Source: Home / Self Care | Attending: Orthopaedic Surgery

## 2020-10-04 ENCOUNTER — Other Ambulatory Visit: Payer: Self-pay

## 2020-10-04 ENCOUNTER — Ambulatory Visit (HOSPITAL_COMMUNITY)
Admission: RE | Admit: 2020-10-04 | Discharge: 2020-10-04 | Disposition: A | Discharge status: Home / Self Care | Payer: Medicare Other | Attending: Orthopaedic Surgery | Admitting: Orthopaedic Surgery

## 2020-10-04 ENCOUNTER — Encounter (HOSPITAL_COMMUNITY): Payer: Self-pay | Admitting: Orthopaedic Surgery

## 2020-10-04 ENCOUNTER — Ambulatory Visit (HOSPITAL_COMMUNITY): Payer: Medicare Other

## 2020-10-04 DIAGNOSIS — M899 Disorder of bone, unspecified: Secondary | ICD-10-CM | POA: Diagnosis not present

## 2020-10-04 DIAGNOSIS — Z87891 Personal history of nicotine dependence: Secondary | ICD-10-CM | POA: Diagnosis not present

## 2020-10-04 DIAGNOSIS — Z79899 Other long term (current) drug therapy: Secondary | ICD-10-CM | POA: Insufficient documentation

## 2020-10-04 DIAGNOSIS — M2459 Contracture, other specified joint: Secondary | ICD-10-CM | POA: Diagnosis not present

## 2020-10-04 DIAGNOSIS — Z791 Long term (current) use of non-steroidal anti-inflammatories (NSAID): Secondary | ICD-10-CM | POA: Insufficient documentation

## 2020-10-04 DIAGNOSIS — Z885 Allergy status to narcotic agent status: Secondary | ICD-10-CM | POA: Insufficient documentation

## 2020-10-04 DIAGNOSIS — Z888 Allergy status to other drugs, medicaments and biological substances status: Secondary | ICD-10-CM | POA: Diagnosis not present

## 2020-10-04 DIAGNOSIS — Z881 Allergy status to other antibiotic agents status: Secondary | ICD-10-CM | POA: Insufficient documentation

## 2020-10-04 DIAGNOSIS — M7662 Achilles tendinitis, left leg: Secondary | ICD-10-CM | POA: Insufficient documentation

## 2020-10-04 HISTORY — DX: Personal history of urinary calculi: Z87.442

## 2020-10-04 HISTORY — PX: TENOLYSIS: SHX396

## 2020-10-04 LAB — CBC
HCT: 43.9 % (ref 36.0–46.0)
Hemoglobin: 15 g/dL (ref 12.0–15.0)
MCH: 31.4 pg (ref 26.0–34.0)
MCHC: 34.2 g/dL (ref 30.0–36.0)
MCV: 92 fL (ref 80.0–100.0)
Platelets: 266 10*3/uL (ref 150–400)
RBC: 4.77 MIL/uL (ref 3.87–5.11)
RDW: 11.9 % (ref 11.5–15.5)
WBC: 11 10*3/uL — ABNORMAL HIGH (ref 4.0–10.5)
nRBC: 0 % (ref 0.0–0.2)

## 2020-10-04 LAB — POCT PREGNANCY, URINE: Preg Test, Ur: NEGATIVE

## 2020-10-04 SURGERY — INCISION, TENDON SHEATH
Anesthesia: General | Site: Ankle | Laterality: Left

## 2020-10-04 MED ORDER — CHLORHEXIDINE GLUCONATE 0.12 % MT SOLN
OROMUCOSAL | Status: AC
  Administered 2020-10-04: 11:00:00 15 mL via OROMUCOSAL
  Filled 2020-10-04: qty 15

## 2020-10-04 MED ORDER — ACETAMINOPHEN 10 MG/ML IV SOLN: 1000.0000 mg | Freq: Once | INTRAVENOUS | Status: DC | PRN

## 2020-10-04 MED ORDER — FENTANYL CITRATE (PF) 250 MCG/5ML IJ SOLN
INTRAMUSCULAR | Status: AC
  Filled 2020-10-04: qty 5

## 2020-10-04 MED ORDER — FENTANYL CITRATE (PF) 250 MCG/5ML IJ SOLN
INTRAMUSCULAR | Status: DC | PRN
  Administered 2020-10-04: 150 ug via INTRAVENOUS

## 2020-10-04 MED ORDER — MIDAZOLAM HCL 2 MG/2ML IJ SOLN: 2.0000 mg | Freq: Once | INTRAMUSCULAR | Status: AC

## 2020-10-04 MED ORDER — SUGAMMADEX SODIUM 500 MG/5ML IV SOLN
INTRAVENOUS | Status: AC
  Filled 2020-10-04: qty 5

## 2020-10-04 MED ORDER — DEXAMETHASONE SODIUM PHOSPHATE 10 MG/ML IJ SOLN
INTRAMUSCULAR | Status: DC | PRN
  Administered 2020-10-04: 10 mg via INTRAVENOUS

## 2020-10-04 MED ORDER — FENTANYL CITRATE (PF) 100 MCG/2ML IJ SOLN: 100.0000 ug | Freq: Once | INTRAMUSCULAR | Status: AC

## 2020-10-04 MED ORDER — ROPIVACAINE HCL 5 MG/ML IJ SOLN
INTRAMUSCULAR | Status: DC | PRN
  Administered 2020-10-04: 10 mL via PERINEURAL
  Administered 2020-10-04: 30 mL via PERINEURAL

## 2020-10-04 MED ORDER — LACTATED RINGERS IV SOLN: INTRAVENOUS | Status: DC

## 2020-10-04 MED ORDER — MIDAZOLAM HCL 2 MG/2ML IJ SOLN
INTRAMUSCULAR | Status: AC
  Filled 2020-10-04: qty 2

## 2020-10-04 MED ORDER — DEXAMETHASONE SODIUM PHOSPHATE 10 MG/ML IJ SOLN
INTRAMUSCULAR | Status: AC
  Filled 2020-10-04: qty 1

## 2020-10-04 MED ORDER — LIDOCAINE 2% (20 MG/ML) 5 ML SYRINGE
INTRAMUSCULAR | Status: DC | PRN
  Administered 2020-10-04: 100 mg via INTRAVENOUS

## 2020-10-04 MED ORDER — PROPOFOL 10 MG/ML IV BOLUS
INTRAVENOUS | Status: DC | PRN
Start: 2020-10-04 — End: 2020-10-04
  Administered 2020-10-04: 200 mg via INTRAVENOUS
  Administered 2020-10-04: 50 mg via INTRAVENOUS

## 2020-10-04 MED ORDER — ORAL CARE MOUTH RINSE: 15.0000 mL | Freq: Once | OROMUCOSAL | Status: AC

## 2020-10-04 MED ORDER — ROCURONIUM BROMIDE 10 MG/ML (PF) SYRINGE
PREFILLED_SYRINGE | INTRAVENOUS | Status: AC
  Filled 2020-10-04: qty 10

## 2020-10-04 MED ORDER — ASPIRIN 325 MG PO TABS: 325.0000 mg | ORAL_TABLET | Freq: Every day | ORAL | 3 refills | Status: DC

## 2020-10-04 MED ORDER — PROPOFOL 10 MG/ML IV BOLUS
INTRAVENOUS | Status: AC
  Filled 2020-10-04: qty 20

## 2020-10-04 MED ORDER — SUGAMMADEX SODIUM 200 MG/2ML IV SOLN
INTRAVENOUS | Status: DC | PRN
  Administered 2020-10-04: 500 mg via INTRAVENOUS

## 2020-10-04 MED ORDER — LIDOCAINE 2% (20 MG/ML) 5 ML SYRINGE
INTRAMUSCULAR | Status: AC
  Filled 2020-10-04: qty 5

## 2020-10-04 MED ORDER — DEXMEDETOMIDINE (PRECEDEX) IN NS 20 MCG/5ML (4 MCG/ML) IV SYRINGE
PREFILLED_SYRINGE | INTRAVENOUS | Status: DC | PRN
  Administered 2020-10-04: 20 ug via INTRAVENOUS

## 2020-10-04 MED ORDER — 0.9 % SODIUM CHLORIDE (POUR BTL) OPTIME
TOPICAL | Status: DC | PRN
  Administered 2020-10-04: 14:00:00 1000 mL

## 2020-10-04 MED ORDER — MIDAZOLAM HCL 2 MG/2ML IJ SOLN
INTRAMUSCULAR | Status: AC
  Administered 2020-10-04: 13:00:00 2 mg via INTRAVENOUS
  Filled 2020-10-04: qty 2

## 2020-10-04 MED ORDER — HYDROCODONE-ACETAMINOPHEN 7.5-325 MG PO TABS: 1.0000 | ORAL_TABLET | Freq: Once | ORAL | Status: DC | PRN

## 2020-10-04 MED ORDER — PHENYLEPHRINE 40 MCG/ML (10ML) SYRINGE FOR IV PUSH (FOR BLOOD PRESSURE SUPPORT)
PREFILLED_SYRINGE | INTRAVENOUS | Status: DC | PRN
  Administered 2020-10-04: 80 ug via INTRAVENOUS

## 2020-10-04 MED ORDER — HYDROCODONE-ACETAMINOPHEN 5-325 MG PO TABS
1.0000 | ORAL_TABLET | ORAL | 0 refills | Status: AC | PRN
Start: 2020-10-04 — End: 2020-10-11

## 2020-10-04 MED ORDER — CEFAZOLIN SODIUM-DEXTROSE 2-4 GM/100ML-% IV SOLN
2.0000 g | INTRAVENOUS | Status: AC
  Administered 2020-10-04: 13:00:00 2 g via INTRAVENOUS

## 2020-10-04 MED ORDER — PROMETHAZINE HCL 25 MG/ML IJ SOLN: 6.2500 mg | INTRAMUSCULAR | Status: DC | PRN

## 2020-10-04 MED ORDER — CHLORHEXIDINE GLUCONATE 0.12 % MT SOLN: 15.0000 mL | Freq: Once | OROMUCOSAL | Status: AC

## 2020-10-04 MED ORDER — MEPERIDINE HCL 25 MG/ML IJ SOLN: 6.2500 mg | INTRAMUSCULAR | Status: DC | PRN

## 2020-10-04 MED ORDER — ROCURONIUM BROMIDE 10 MG/ML (PF) SYRINGE
PREFILLED_SYRINGE | INTRAVENOUS | Status: DC | PRN
  Administered 2020-10-04: 100 mg via INTRAVENOUS

## 2020-10-04 MED ORDER — FENTANYL CITRATE (PF) 100 MCG/2ML IJ SOLN
INTRAMUSCULAR | Status: AC
  Administered 2020-10-04: 12:00:00 100 ug via INTRAVENOUS
  Filled 2020-10-04: qty 2

## 2020-10-04 MED ORDER — MIDAZOLAM HCL 2 MG/2ML IJ SOLN
INTRAMUSCULAR | Status: DC | PRN
  Administered 2020-10-04: 2 mg via INTRAVENOUS

## 2020-10-04 MED ORDER — CEFAZOLIN SODIUM-DEXTROSE 2-4 GM/100ML-% IV SOLN
INTRAVENOUS | Status: AC
  Filled 2020-10-04: qty 100

## 2020-10-04 MED ORDER — ONDANSETRON HCL 4 MG/2ML IJ SOLN
INTRAMUSCULAR | Status: AC
  Filled 2020-10-04: qty 2

## 2020-10-04 MED ORDER — CEFAZOLIN SODIUM-DEXTROSE 1-4 GM/50ML-% IV SOLN
INTRAVENOUS | Status: DC | PRN
  Administered 2020-10-04: 1 g via INTRAVENOUS

## 2020-10-04 MED ORDER — HYDROMORPHONE HCL 1 MG/ML IJ SOLN
0.2500 mg | INTRAMUSCULAR | Status: DC | PRN
Start: 2020-10-04 — End: 2020-10-04

## 2020-10-04 MED ORDER — ONDANSETRON HCL 4 MG/2ML IJ SOLN
INTRAMUSCULAR | Status: DC | PRN
  Administered 2020-10-04: 4 mg via INTRAVENOUS

## 2020-10-04 MED ORDER — PHENYLEPHRINE 40 MCG/ML (10ML) SYRINGE FOR IV PUSH (FOR BLOOD PRESSURE SUPPORT)
PREFILLED_SYRINGE | INTRAVENOUS | Status: AC
  Filled 2020-10-04: qty 10

## 2020-10-04 SURGICAL SUPPLY — 59 items
APL PRP STRL LF DISP 70% ISPRP (MISCELLANEOUS) ×1
APL SKNCLS STERI-STRIP NONHPOA (GAUZE/BANDAGES/DRESSINGS)
BANDAGE ESMARK 6X9 LF (GAUZE/BANDAGES/DRESSINGS) IMPLANT
BENZOIN TINCTURE PRP APPL 2/3 (GAUZE/BANDAGES/DRESSINGS) IMPLANT
BLADE SURG 15 STRL LF DISP TIS (BLADE) ×2 IMPLANT
BLADE SURG 15 STRL SS (BLADE) ×6
BNDG CMPR 9X6 STRL LF SNTH (GAUZE/BANDAGES/DRESSINGS)
BNDG CMPR MED 10X6 ELC LF (GAUZE/BANDAGES/DRESSINGS) ×1
BNDG ELASTIC 6X10 VLCR STRL LF (GAUZE/BANDAGES/DRESSINGS) ×3 IMPLANT
BNDG ESMARK 6X9 LF (GAUZE/BANDAGES/DRESSINGS)
CHLORAPREP W/TINT 26 (MISCELLANEOUS) ×3 IMPLANT
CLOSURE WOUND 1/2 X4 (GAUZE/BANDAGES/DRESSINGS)
COVER WAND RF STERILE (DRAPES) IMPLANT
CUFF TOURN SGL QUICK 34 (TOURNIQUET CUFF) ×3
CUFF TOURN SGL QUICK 42 (TOURNIQUET CUFF) ×2 IMPLANT
CUFF TRNQT CYL 34X4.125X (TOURNIQUET CUFF) ×1 IMPLANT
DECANTER SPIKE VIAL GLASS SM (MISCELLANEOUS) IMPLANT
DRAPE C-ARM 42X72 X-RAY (DRAPES) ×3 IMPLANT
DRAPE C-ARMOR (DRAPES) ×3 IMPLANT
DRAPE EXTREMITY T 121X128X90 (DISPOSABLE) ×3 IMPLANT
DRAPE IMP U-DRAPE 54X76 (DRAPES) ×3 IMPLANT
DRAPE U-SHAPE 47X51 STRL (DRAPES) ×3 IMPLANT
DRSG PAD ABDOMINAL 8X10 ST (GAUZE/BANDAGES/DRESSINGS) IMPLANT
ELECT REM PT RETURN 9FT ADLT (ELECTROSURGICAL) ×3
ELECTRODE REM PT RTRN 9FT ADLT (ELECTROSURGICAL) ×1 IMPLANT
GAUZE SPONGE 4X4 12PLY STRL (GAUZE/BANDAGES/DRESSINGS) ×3 IMPLANT
GAUZE SPONGE 4X4 12PLY STRL LF (GAUZE/BANDAGES/DRESSINGS) ×3 IMPLANT
GAUZE XEROFORM 1X8 LF (GAUZE/BANDAGES/DRESSINGS) ×2 IMPLANT
GLOVE BIOGEL M STRL SZ7.5 (GLOVE) ×3 IMPLANT
GLOVE BIOGEL PI IND STRL 8 (GLOVE) ×1 IMPLANT
GLOVE BIOGEL PI INDICATOR 8 (GLOVE) ×2
GOWN STRL REUS W/ TWL LRG LVL3 (GOWN DISPOSABLE) ×1 IMPLANT
GOWN STRL REUS W/ TWL XL LVL3 (GOWN DISPOSABLE) ×1 IMPLANT
GOWN STRL REUS W/TWL LRG LVL3 (GOWN DISPOSABLE) ×3
GOWN STRL REUS W/TWL XL LVL3 (GOWN DISPOSABLE) ×3
IMPL ACHILLES SPEEDB 4.75X19.1 (Screw) ×2 IMPLANT
KIT BASIN OR (CUSTOM PROCEDURE TRAY) ×3 IMPLANT
NS IRRIG 1000ML POUR BTL (IV SOLUTION) ×3 IMPLANT
PACK ORTHO EXTREMITY (CUSTOM PROCEDURE TRAY) ×3 IMPLANT
PAD CAST 4YDX4 CTTN HI CHSV (CAST SUPPLIES) ×1 IMPLANT
PADDING CAST COTTON 4X4 STRL (CAST SUPPLIES) ×3
PADDING CAST SYNTHETIC 4 (CAST SUPPLIES) ×8
PADDING CAST SYNTHETIC 4X4 STR (CAST SUPPLIES) ×1 IMPLANT
SCOTCHCAST PLUS 4X4 WHITE (CAST SUPPLIES) ×6 IMPLANT
SPONGE LAP 18X18 RF (DISPOSABLE) IMPLANT
STAPLER VISISTAT 35W (STAPLE) ×2 IMPLANT
STRIP CLOSURE SKIN 1/2X4 (GAUZE/BANDAGES/DRESSINGS) IMPLANT
SUCTION FRAZIER HANDLE 10FR (MISCELLANEOUS) ×3
SUCTION TUBE FRAZIER 10FR DISP (MISCELLANEOUS) ×1 IMPLANT
SUT ETHILON 3 0 PS 1 (SUTURE) ×3 IMPLANT
SUT MNCRL AB 3-0 PS2 18 (SUTURE) ×3 IMPLANT
SUT MNCRL AB 3-0 PS2 27 (SUTURE) ×2 IMPLANT
SUT PDS AB 2-0 CT2 27 (SUTURE) ×3 IMPLANT
SUT VIC AB 0 CT1 27 (SUTURE)
SUT VIC AB 0 CT1 27XBRD ANBCTR (SUTURE) IMPLANT
TOWEL GREEN STERILE FF (TOWEL DISPOSABLE) ×6 IMPLANT
TUBE CONNECTING 20'X1/4 (TUBING) ×2
TUBE CONNECTING 20X1/4 (TUBING) ×4 IMPLANT
UNDERPAD 30X36 HEAVY ABSORB (UNDERPADS AND DIAPERS) ×3 IMPLANT

## 2020-10-04 NOTE — Anesthesia Procedure Notes (Signed)
Anesthesia Regional Block: Popliteal block   Pre-Anesthetic Checklist: ,, timeout performed, Correct Patient, Correct Site, Correct Laterality, Correct Procedure, Correct Position, site marked, Risks and benefits discussed, pre-op evaluation,  At surgeon's request and post-op pain management  Laterality: Left  Prep: Maximum Sterile Barrier Precautions used, chloraprep       Needles:  Injection technique: Single-shot  Needle Type: Echogenic Needle     Needle Length: 9cm  Needle Gauge: 21     Additional Needles:   Procedures:,,,, ultrasound used (permanent image in chart),,,,  Narrative:  Start time: 10/04/2020 12:11 PM End time: 10/04/2020 12:22 PM Injection made incrementally with aspirations every 5 mL.  Performed by: Personally  Anesthesiologist: Trevor Iha, MD  Additional Notes: Block assessed. Patient tolerated procedure well.

## 2020-10-04 NOTE — H&P (Signed)
PREOPERATIVE H&P  Chief Complaint: Left heel pain   HPI: Rebekah Davis is a 34 y.o. 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  Equinus contracture.  Patient is here today for surgical correction of this issue.. Symptoms are rated as moderate to severe, and have been worsening.  This is significantly impairing activities of daily living.  She has elected for surgical management.   Past Medical History:  Diagnosis Date  . Arthritis   . History of kidney stones   . Migraine   . PCOS (polycystic ovarian syndrome)    History reviewed. No pertinent surgical history. Social History   Socioeconomic History  . Marital status: Single    Spouse name: Not on file  . Number of children: Not on file  . Years of education: Not on file  . Highest education level: Not on file  Occupational History  . Not on file  Tobacco Use  . Smoking status: Former Games developer  . Smokeless tobacco: Never Used  Vaping Use  . Vaping Use: Never used  Substance and Sexual Activity  . Alcohol use: Not Currently  . Drug use: Not Currently  . Sexual activity: Not on file  Other Topics Concern  . Not on file  Social History Narrative  . Not on file   Social Determinants of Health   Financial Resource Strain: Not on file  Food Insecurity: Not on file  Transportation Needs: Not on file  Physical Activity: Not on file  Stress: Not on file  Social Connections: Not on file   Family History  Problem Relation Age of Onset  . Hypertension Mother   . Diabetes Mother   . Kidney disease Mother   . Depression Mother   . Hypertension Father   . Diabetes Father    Allergies  Allergen Reactions  . Adhesive [Tape] Other (See Comments)    blister  . Cherry Anaphylaxis and Rash  . Other Hives and Rash  . Sulfamethoxazole-Trimethoprim Hives  . Levofloxacin     Other reaction(s): Muscle Pain, Other (See  Comments)  . Morphine     Other reaction(s): Other (See Comments) Feels like unable to breath or swallow   . Gabapentin Rash  . Metformin Nausea And Vomiting    Other reaction(s): Other (See Comments) diaphoresis   . Sumatriptan Nausea And Vomiting    Other reaction(s): Other (See Comments) Decreased heart rate and respiratory rate Decreased heart rate and respiratory rate    Prior to Admission medications   Medication Sig Start Date End Date Taking? Authorizing Provider  acetaminophen (TYLENOL) 500 MG tablet Take 1,000 mg by mouth every 8 (eight) hours as needed for moderate pain.   Yes [provider]  ARIPiprazole (ABILIFY) 20 MG tablet Take 1 tablet (20 mg total) by mouth daily. 09/14/20 09/14/21 Yes King, Shana Chute, NP  escitalopram (LEXAPRO) 10 MG tablet Take 1 tablet (10 mg total) by mouth 2 (two) times daily. Patient taking differently: Take 10 mg by mouth daily. Pt takes with 5 mg every morning = 15 mg 09/14/20 10/14/20 Yes King, Shana Chute, NP  escitalopram (LEXAPRO) 5 MG tablet Take 5 mg by mouth daily. Pt takes with 10 mg every morning = 15 mg   Yes [provider]  meloxicam (MOBIC) 15 MG tablet Take 15 mg by mouth daily.   Yes [provider]     Positive ROS: All other systems have been reviewed and  were otherwise negative with the exception of those mentioned in the HPI and as above.  Physical Exam:  Vitals:   10/04/20 1216 10/04/20 1228  BP: (!) 160/84 (!) 171/81  Pulse: (!) 122 (!) 127  Resp: 15 13  Temp:    SpO2: 100% 100%   General: Alert, no acute distress Cardiovascular: No pedal edema Respiratory: No cyanosis, no use of accessory musculature GI: No organomegaly, abdomen is soft and non-tender Skin: No lesions in the area of chief complaint Neurologic: Sensation intact distally Psychiatric: Patient is competent for consent with normal mood and affect Lymphatic: No axillary or cervical lymphadenopathy  MUSCULOSKELETAL: Left  ankle demonstrates tenderness of the distal aspect of the Achilles and along the calcaneal tuberosity.  She has dorsiflexion passively to neutral with the leg straight.  Pain with forced ankle dorsiflexion.  Good plantarflexion strength.  Foot is warm and well-perfused.  Assessment: Left chronic Achilles tendinitis with equinus to contracture   Plan: Plan for left Achilles tendon reconstruction and calcaneal exostectomy.  Possible FHL transfer.  Gastrocnemius recession.  We discussed the risks, benefits and alternatives of surgery which include but are not limited to wound healing complications, infection, nonunion, malunion, need for further surgery, damage to surrounding structures and continued pain.  They understand there is no guarantees to an acceptable outcome.  After weighing these risks they opted to proceed with surgery.     Terance Hart, MD    10/04/2020 12:42 PM

## 2020-10-04 NOTE — Anesthesia Procedure Notes (Signed)
Procedure Name: Intubation Date/Time: 10/04/2020 1:18 PM Performed by: Dorthea Cove, CRNA Pre-anesthesia Checklist: Patient identified, Emergency Drugs available, Suction available and Patient being monitored Patient Re-evaluated:Patient Re-evaluated prior to induction Oxygen Delivery Method: Circle system utilized Preoxygenation: Pre-oxygenation with 100% oxygen Induction Type: IV induction Ventilation: Mask ventilation without difficulty Laryngoscope Size: Mac, Glidescope and 3 Grade View: Grade I Tube type: Oral Tube size: 7.0 mm Number of attempts: 1 Airway Equipment and Method: Stylet and Oral airway Placement Confirmation: ETT inserted through vocal cords under direct vision,  positive ETCO2 and breath sounds checked- equal and bilateral Secured at: 23 cm Tube secured with: Tape Dental Injury: Teeth and Oropharynx as per pre-operative assessment

## 2020-10-04 NOTE — Discharge Instructions (Signed)
DR. [facility] POST-OP INSTRUCTIONS   Pain Management 1. The numbing medicine and your leg will last around 18 hours, take a dose of your pain medicine as soon as you feel it wearing off to avoid rebound pain. 2. Keep your foot elevated above heart level.  Make sure that your heel hangs free ('floats'). 3. Take all prescribed medication as directed. 4. If taking narcotic pain medication you may want to use an over-the-counter stool softener to avoid constipation. 5. You may take over-the-counter NSAIDs (ibuprofen, naproxen, etc.) as well as over-the-counter acetaminophen as directed on the packaging as a supplement for your pain and may also use it to wean away from the prescription medication.  Activity ? Non-weightbearing ? Keep splint intact  First Postoperative Visit 1. Your first postop visit will be at least 2 weeks after surgery.  This should be scheduled when you schedule surgery. 2. If you do not have a postoperative visit scheduled please call [phone] to schedule an appointment. 3. At the appointment your incision will be evaluated for suture removal, x-rays will be obtained if necessary.  General Instructions 1. Swelling is very common after foot and ankle surgery.  It often takes 3 months for the foot and ankle to begin to feel comfortable.  Some amount of swelling will persist for 6-12 months. 2. DO NOT change the dressing.  If there is a problem with the dressing (too tight, loose, gets wet, etc.) please contact Dr. Jerimah Witucki's office. 3. DO NOT get the dressing wet.  For showers you can use an over-the-counter cast cover or wrap a washcloth around the top of your dressing and then cover it with a plastic bag and tape it to your leg. 4. DO NOT soak the incision (no tubs, pools, bath, etc.) until you have approval from Dr. Keiara Sneeringer.  Contact Dr. Adairs office or go to Emergency Room if: 1. Temperature above 101 F. 2. Increasing pain that is unresponsive to pain  medication or elevation 3. Excessive redness or swelling in your foot 4. Dressing problems - excessive bloody drainage, looseness or tightness, or if dressing gets wet 5. Develop pain, swelling, warmth, or discoloration of your calf  

## 2020-10-04 NOTE — Transfer of Care (Signed)
Immediate Anesthesia Transfer of Care Note  Patient: Rebekah Davis  Procedure(s) Performed: LEFT ACHILLES TENDON DEBRIDEMENT AND RECONSTRUCTION, CALCANEAL EXOSTECTOMY, GASTROCNEMIUS RECESSION,  FLEXOR HALLUCIS LONGUS TRANSFER (Left Ankle)  Patient Location: PACU  Anesthesia Type:General  Level of Consciousness: awake, alert  and oriented  Airway & Oxygen Therapy: Patient Spontanous Breathing  Post-op Assessment: Report given to RN and Post -op Vital signs reviewed and stable  Post vital signs: Reviewed and stable  Last Vitals:  Vitals Value Taken Time  BP    Temp    Pulse    Resp    SpO2      Last Pain:  Vitals:   10/04/20 1103  TempSrc:   PainSc: 1          Complications: No complications documented.

## 2020-10-04 NOTE — Anesthesia Postprocedure Evaluation (Signed)
Anesthesia Post Note  Patient: Rebekah Davis, Rebekah Davis  Procedure(s) Performed: LEFT ACHILLES TENDON DEBRIDEMENT AND RECONSTRUCTION, CALCANEAL EXOSTECTOMY, GASTROCNEMIUS RECESSION,  FLEXOR HALLUCIS LONGUS TRANSFER (Left Ankle)     Patient location during evaluation: PACU Anesthesia Type: General and Regional Level of consciousness: awake and alert Pain management: pain level controlled Vital Signs Assessment: post-procedure vital signs reviewed and stable Respiratory status: spontaneous breathing, nonlabored ventilation, respiratory function stable and patient connected to nasal cannula oxygen Cardiovascular status: blood pressure returned to baseline and stable Postop Assessment: no apparent nausea or vomiting Anesthetic complications: no   No complications documented.  Last Vitals:  Vitals:   10/04/20 1515 10/04/20 1530  BP: 136/75 140/78  Pulse: 94 (!) 102  Resp: 14 16  Temp:    SpO2: 100% 100%    Last Pain:  Vitals:   10/04/20 1515  TempSrc:   PainSc: 0-No pain                 Trevor Iha

## 2020-10-04 NOTE — Anesthesia Procedure Notes (Addendum)
Anesthesia Regional Block: Adductor canal block   Pre-Anesthetic Checklist: ,, timeout performed, Correct Patient, Correct Site, Correct Laterality, Correct Procedure, Correct Position, site marked, Risks and benefits discussed,  Surgical consent,  Pre-op evaluation,  At surgeon's request and post-op pain management  Laterality: Lower and Left  Prep: chloraprep       Needles:  Injection technique: Single-shot  Needle Type: Echogenic Needle     Needle Length: 9cm  Needle Gauge: 22     Additional Needles:   Procedures:,,,, ultrasound used (permanent image in chart),,,,  Narrative:  Start time: 10/04/2020 12:24 PM End time: 10/04/2020 12:30 PM Injection made incrementally with aspirations every 5 mL.  Performed by: Personally  Anesthesiologist: Trevor Iha, MD  Additional Notes: Block assessed prior to surgery. Pt tolerated procedure well.

## 2020-10-04 NOTE — OR Nursing (Addendum)
Pt is awake,alert and oriented. Pt is in NAD at this time. Pt and family verbalized understanding of poc and discharge instructions. instructions given to family and reviewed prior to discharge.Pt is ambulatory to wheelchair with stand by assist but not needed with steady gait- non weight bearing leg, pt has crutches at home and has been trained on prior to surgery Friend made aware of block pt recieved

## 2020-10-04 NOTE — Anesthesia Preprocedure Evaluation (Signed)
Anesthesia Evaluation  Patient identified by MRN, date of birth, ID band Patient awake    Reviewed: Allergy & Precautions, NPO status , Patient's Chart, lab work & pertinent test results  Airway Mallampati: III  TM Distance: >3 FB Neck ROM: Full    Dental no notable dental hx. (+) Teeth Intact, Dental Advisory Given   Pulmonary sleep apnea , former smoker,    Pulmonary exam normal breath sounds clear to auscultation       Cardiovascular Exercise Tolerance: Good hypertension, negative cardio ROS Normal cardiovascular exam Rhythm:Regular Rate:Normal     Neuro/Psych  Headaches, Anxiety  Neuromuscular disease    GI/Hepatic Neg liver ROS, GERD  ,  Endo/Other  negative endocrine ROS  Renal/GU negative Renal ROS     Musculoskeletal  (+) Arthritis ,   Abdominal   Peds  Hematology negative hematology ROS (+)   Anesthesia Other Findings   Reproductive/Obstetrics                             Anesthesia Physical Anesthesia Plan  ASA: III  Anesthesia Plan: General   Post-op Pain Management:    Induction: Intravenous  PONV Risk Score and Plan: 4 or greater and Treatment may vary due to age or medical condition, Ondansetron, Dexamethasone and Midazolam  Airway Management Planned: Oral ETT  Additional Equipment: None  Intra-op Plan:   Post-operative Plan: Extubation in OR  Informed Consent: I have reviewed the patients History and Physical, chart, labs and discussed the procedure including the risks, benefits and alternatives for the proposed anesthesia with the patient or authorized representative who has indicated his/her understanding and acceptance.     Dental advisory given  Plan Discussed with: CRNA and Anesthesiologist  Anesthesia Plan Comments:         Anesthesia Quick Evaluation

## 2020-10-05 ENCOUNTER — Encounter (HOSPITAL_COMMUNITY): Payer: Self-pay | Admitting: Orthopaedic Surgery

## 2020-10-06 ENCOUNTER — Other Ambulatory Visit: Payer: Self-pay | Admitting: Nurse Practitioner

## 2020-10-11 ENCOUNTER — Telehealth: Payer: Self-pay | Admitting: Nurse Practitioner

## 2020-10-11 NOTE — Telephone Encounter (Signed)
Called and spoke w/ pt stop taking Abilify because surgery , pt stopped taking meds about 10 days ago, per Dorene Grebe pt can being taking 1/2 tab once a day for the first week then start or back on the 20mg . If she has any concerns please call the office.

## 2020-10-12 NOTE — Op Note (Signed)
Rebekah Davis female 34 y.o. 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 female With long-standing chronic insertional Achilles tendinitis, gastrocnemius contracture and calcaneal exostosis failed conservative treatment.  She tried boot immobilization, cast immobilization, period of nonweightbearing as well as physical therapy without significant relief.  Given the continued discomfort she opted for surgery.  Patient understood the risks, benefits and alternatives of surgery which include but are not limited to wound healing complications, infection, non-pain relief, continued discomfort and disability.  After weighing his wrist he opted to proceed with surgery.  PROCEDURE: The patient was identified in the preoperative holding area.  The Left leg was marked by myself.  The consent was signed by myself and the patient.  Peripheral nerve blockade was performed by anesthesia.  They was taken to the operative suite where general anesthesia was induced without difficulty.  Preoperative antibiotics were given.  The patient was then placed prone on the operative table.  Chest rolls were used and all bony prominences were well-padded.  A thigh tourniquet was placed on the operative thigh.  The operative extremity was then prepped and draped in the usual sterile fashion.  Surgical timeout was performed.     We began by making a small 3 cm incision on the medial aspect of the calf just distal to the transition from the  gastroc muscle to the tendon.  This was taken sharply down through skin and subcutaneous tissue.  Blunt dissection was used to identify the underlying fascia.  The fascia was identified and incision was made in the fascia in line with the incision.  Then the underlying muscle belly was identified.  The interval between the gastrocnemius and soleus muscle was identified.  A space was created between the 2 muscle bellies and the tendinous portions distally.  Care was taken to identify any branch of the sural nerve.  None were identified within the surgical field.  The gastroc tendon was further mobilized posteriorly and anteriorly and then a curved Mayo scissor was used to cut the gastrocnemius tendon performing a gastrocnemius recession.  This was allowed to retract.  Then the wound was irrigated closely with normal saline.  The tissue was closed in a layered fashion using 3-0 Monocryl and 3-0 nylon.  We then turned our attention to the Achilles distally.We began by making a longitudinal incision on the medial aspect of the distal Achilles tendon that curved into the central portion of the calcaneus tuberosity.  This was taken sharply down through skin, subcutaneous tissue and peritenon.  The tendon was identified and medial and lateral sub-peritenon flaps were created.    The tendon was fully exposed and removed from the calcaneal tuberosity.  The tendon was diseased appearing with tendinosis portions as well as calcified portions distally. The diseased tendon was sharply debrided and excised with a 15 blade and scissors. Then using a Freer and a Cobb elevator the proximal portions of adhesed tendon were lysed circumferentially   There was a large amount of inflamed synovial tissue in the retro-calcaneal area.  The inflamed bursal and synovial tissue was excisionally debrided with 15 blade and excised.  Then the calcaneal tuberosity was inspected and using  a sagittal saw the Haglund's bump and enthesophytes were  removed.  Rondure was used to ensure adequate removal of all bony prominences medially, laterally and posteriorly.  Acceptable bony debridement was performed and the bone was excised.  Then using the Arthrex drill the 4 drill tunnels were placed within the calcaneus and the holes were tapped.  Then the suture tape was run through the Achilles tendon within healthy-appearing tendon at the point of maximal tension with repair onto the calcaneus.  Then this was tied down to the calcaneus.  Then the suture bridge technique was used to fix this distally.  There was excellent fixation and tension on the Achilles after repair.  Then the medial, lateral and posterior aspect of the repair was inspected and palpated for any bony prominences and there were none found.  X-ray confirmed adequate bony resection.  The wound was then irrigated with normal saline and the tourniquet was released.  Hemostasis was obtained using Bovie cautery and direct compression.  Then the peritenon tissue was closed with a 2-0 PDS suture.  The subcuticular tissue was closed with 3-0 Monocryl and the skin with 3-0 nylon.   Xeroform, 4 x 4's and sterile she cotton was placed.  She was placed in a short leg nonweightbearing cast in a plantarflexed position.  There were no complications.  She tolerated the procedure well.  She was placed back in the supine position on her hospital bed and extubated.  She was taken to the recovery room in stable condition.  POST OPERATIVE INSTRUCTIONS: Nonweightbearing to operative extremity Keep cast dry and in place Call the office with any concerns Follow-up in 2 weeks for cast removal, suture removal if appropriate, no x-rays needed.  TOURNIQUET TIME:Less than one hour  BLOOD LOSS:  Minimal         DRAINS: none         SPECIMEN: none       COMPLICATIONS:  * No complications entered in OR log *         Disposition: PACU - hemodynamically stable.         Condition: stable

## 2020-11-16 ENCOUNTER — Encounter (HOSPITAL_COMMUNITY): Payer: Self-pay | Admitting: Emergency Medicine

## 2020-11-16 ENCOUNTER — Other Ambulatory Visit: Payer: Self-pay

## 2020-11-16 ENCOUNTER — Ambulatory Visit (HOSPITAL_COMMUNITY)
Admission: EM | Admit: 2020-11-16 | Discharge: 2020-11-16 | Disposition: A | Discharge status: Home / Self Care | Payer: Medicare Other | Attending: Registered Nurse | Admitting: Registered Nurse

## 2020-11-16 DIAGNOSIS — F332 Major depressive disorder, recurrent severe without psychotic features: Secondary | ICD-10-CM | POA: Diagnosis not present

## 2020-11-16 DIAGNOSIS — F4310 Post-traumatic stress disorder, unspecified: Secondary | ICD-10-CM | POA: Diagnosis not present

## 2020-11-16 DIAGNOSIS — F322 Major depressive disorder, single episode, severe without psychotic features: Secondary | ICD-10-CM | POA: Diagnosis not present

## 2020-11-16 DIAGNOSIS — F603 Borderline personality disorder: Secondary | ICD-10-CM | POA: Diagnosis present

## 2020-11-16 DIAGNOSIS — R45851 Suicidal ideations: Secondary | ICD-10-CM

## 2020-11-16 HISTORY — DX: Post-traumatic stress disorder, unspecified: F43.10

## 2020-11-16 HISTORY — DX: Anxiety disorder, unspecified: F41.9

## 2020-11-16 HISTORY — DX: Depression, unspecified: F32.A

## 2020-11-16 NOTE — Progress Notes (Signed)
Rebekah Davis received her AVS, questions answered and they were escorted to retrieve their personal belongings.

## 2020-11-16 NOTE — ED Triage Notes (Signed)
Pt present to [facility] as a walk-in with complaints of Suicidal thoughts with plan to overdose on her meds. Pt reported having this thoughts since Thursday but it is more concrete today. Pt stated that she had heel resection surgery on 10/04/20 and has boots on her left leg. Pt is also using crutches to ambulate. Pt endorses SI. Pt denies HI and AVH at this time.

## 2020-11-16 NOTE — Discharge Summary (Signed)
Rebekah Davis to be D/C'd Home per MD order.  Discussed with the patient and all questions fully answered.  VSS, Skin clean, dry and intact without evidence of skin break down, no evidence of skin tears noted.  An After Visit Summary was printed and given to the patient. Patient received community resources.  D/c education completed with patient/family including follow up instructions, medication list, d/c activities limitations if indicated, with other d/c instructions as indicated by MD - patient able to verbalize understanding, all questions fully answered.   Patient instructed to return to ED, call 911, or call MD for any changes in condition.   Patient escorted via WC, and D/C home via private auto.  Rebekah Davis 11/16/2020 5:13 PM
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
## 2020-11-16 NOTE — Discharge Instructions (Addendum)
Please follow up with the following facility for Partial Hospitalization Program:  [facility] at [facility] [address] [place], [place], [place] [address] Hours: 7am-5pm Phone: [phone]  [facility](2nd Floor) [address] [place], [place] [address] [phone]  Call to schedule an appointment:  If you hear nothing by 10:00 tomorrow

## 2020-11-16 NOTE — ED Provider Notes (Signed)
Behavioral Health Urgent Care Medical Screening Exam  Patient Name: Rebekah Davis MRN: [id number] Date of Evaluation: 11/16/20 Chief Complaint: Chief Complaint/Presenting Problem: Pt endorsed suicidal ideation, despondency Diagnosis:  Final diagnoses:  Passive suicidal ideations  Borderline personality disorder (HCC)  MDD (major depressive disorder), recurrent severe, without psychosis (HCC)  PTSD (post-traumatic stress disorder)    History of Present illness: Rebekah Davis is a 35 y.o. female patient presented to [facility] as a walk in accompanied by mentor at [employer] with complaints of worsening depression, anxiety, and passive suicidal thoughts.  Rebekah Davis, 35 y.o., female patient seen face to face by this provider, consulted with Dr. Bronwen Betters; and chart reviewed on 11/16/20.  On evaluation Rebekah Davis reports she has had worsening depression and anxiety for the last several weeks worsening over the last week with suicidal thoughts.  Patient states she doesn't feel that she would do anything to kill herself but she can tell that things are getting worse.  Patient is living at [facility] and has her mentor here with her.  Has outpatient psychiatric services with therapy that she has had only 2-3 visits with.  She does not have outpatient medication management.  Currently her psychotropic medications are being managed by her primary care provider.  Patient reports after her surgery in December she stopped her Lexapro and Abilify.  About 3 weeks ago she started taking them again but breaking them in half so and Abilify 20 mg tablets are broken and have stated she was taking only 10 and she was only doing 10 mg of the Lexapro also.  Patient encouraged to start taking her medication as ordered which would be 20 mg of Abilify 50 mg of Lexapro and not to break the tablets in half that they are not scored.  Also discussed partial hospitalization with patient which she is interested  in.  Discussed safety plan with patient also.  During evaluation Rebekah Davis is sitting upright in chair in no acute distress.  She is alert, oriented x 4, calm and cooperative.  Her mood is depressed with congruent affect.  She does not appear to be responding to internal/external stimuli or delusional thoughts.  Patient denies homicidal ideation, psychosis, and paranoia.  Patient continues to have some passive suicidal thoughts but she is able to contract for safety.  Patient's mentor also states that feels patient would be safe going home and that she will be there for patient and patient can contact her if needed to come back to the hospital.  Patient also states she has a roommate who she is willing to talk to.  Patient answered question appropriately.   Safety plan: Patient will reach out to her mentor, roommate, mobile crisis, or 911 if suicidal thoughts become active Patient will go to open access at [facility] tomorrow morning for medication management Referral also sent in for partial hospitalization program. The suicide prevention education provided includes the following:  Suicide risk factors  Suicide prevention and interventions  National Suicide Hotline telephone number  [facility] assessment telephone number  [facility] Emergency Assistance 911  [facility] and/or Residential Mobile Crisis Unit telephone number   Request made of family/significant other to:  Remove weapons (e.g., guns, rifles, knives), all items previously/currently identified as safety concern.   Remove drugs/medications (over the counter, prescriptions, illicit drugs), all items previously/currently identified as a safety concern.    Psychiatric Specialty Exam  Presentation  General Appearance:Appropriate for Environment; Casual  Eye Contact:Good  Speech:Clear and Coherent; Normal Rate  Speech Volume:Normal  Handedness:Right   Mood and Affect  Mood:Anxious;  Depressed  Affect:Congruent   Thought Process  Thought Processes:Goal Directed; Coherent  Descriptions of Associations:Intact  Orientation:Full (Time, Place and Person)  Thought Content:WDL  Hallucinations:None  Ideas of Reference:None  Suicidal Thoughts:Yes, Active Without Intent; With Plan; With Means to Carry Out (States she has thought about overdosing or cutting herself but doesn't feel that she woud do it.  States she would reach out to someone before she would try to hurt herself)  Homicidal Thoughts:No   Sensorium  Memory:Immediate Good; Recent Good; Remote Good  Judgment:Intact  Insight:Present   Executive Functions  Concentration:Good  Attention Span:Good  Recall:Good  Fund of Knowledge:Good  Language:Good   Psychomotor Activity  Psychomotor Activity:Normal   Assets  Assets:Communication Skills; Desire for Improvement; Financial Resources/Insurance; Housing; Resilience; Social Support; Transportation   Sleep  Sleep:Fair  Number of hours: No data recorded  Physical Exam: Physical Exam Vitals and nursing note reviewed. Exam conducted with a chaperone present.  Constitutional:      General: She is not in acute distress.    Appearance: Normal appearance. She is obese. She is not ill-appearing.  HENT:     Head: Normocephalic.  Eyes:     Pupils: Pupils are equal, round, and reactive to light.  Cardiovascular:     Rate and Rhythm: Normal rate.  Pulmonary:     Effort: Pulmonary effort is normal.  Musculoskeletal:        General: Normal range of motion.     Cervical back: Normal range of motion.     Comments: Patient has orthopedic boot on left foot and using crutches to help with ambulation related to heel surgery  Skin:    General: Skin is warm and dry.  Neurological:     Mental Status: She is alert and oriented to person, place, and time.  Psychiatric:        Attention and Perception: Attention and perception normal. She does not  perceive auditory or visual hallucinations.        Mood and Affect: Mood is anxious and depressed.        Speech: Speech normal.        Behavior: Behavior normal. Behavior is cooperative.        Thought Content: Thought content is not paranoid or delusional. Thought content does not include homicidal ideation. Suicidal: Passive; able to contract for safety.        Cognition and Memory: Cognition and memory normal.    Review of Systems  Constitutional: Negative.   HENT: Negative.   Eyes: Negative.   Respiratory: Negative.   Cardiovascular: Negative.   Gastrointestinal: Negative.   Genitourinary: Negative.   Musculoskeletal: Negative.   Skin: Negative.   Neurological: Negative.   Endo/Heme/Allergies: Negative.   Psychiatric/Behavioral: Negative for hallucinations, memory loss and substance abuse. Depression: Stable. Suicidal ideas: Passive thoughts; able to contract for safety. The patient has insomnia. Nervous/anxious: Stable.    Blood pressure (!) 122/99, pulse (!) 139, temperature 98.4 F (36.9 C), temperature source Oral, resp. rate 20, SpO2 100 %. There is no height or weight on file to calculate BMI.  Musculoskeletal: Strength & Muscle Tone: within normal limits Gait & Station: normal Patient leans: N/A   [facility] MSE Discharge Disposition for Follow up and Recommendations: Based on my evaluation the patient does not appear to have an emergency medical condition and can be discharged with resources and follow up care in outpatient  services for Medication Management, Partial Hospitalization Program, Individual Therapy and Group Therapy    Follow-up Information    Go to  [facility].   Specialty: Urgent Care Why: Open Access:  Monday - Thursday from 8 am to 11 am for medication management and therapy intake.  On Friday from 1 pm to 4 pm for therapy intake only Contact information: [address] [place] [address] [phone]               Discharge Instructions     Please follow up with the following facility for Partial Hospitalization Program:  [facility] at [facility] [address] [place], [place], [place] [address] Hours: 7am-5pm Phone: [phone]  [facility]gent Care(2nd Floor) [address] [place], [place] [address] [phone]  Call to schedule an appointment:  If you hear nothing by 10:00 tomorrow     Assunta Found, NP 11/16/2020, 4:52 PM

## 2020-11-17 ENCOUNTER — Ambulatory Visit (INDEPENDENT_AMBULATORY_CARE_PROVIDER_SITE_OTHER)
Admission: EM | Admit: 2020-11-17 | Discharge: 2020-11-17 | Disposition: A | Discharge status: Home / Self Care | Payer: Medicare Other | Source: Home / Self Care

## 2020-11-17 ENCOUNTER — Encounter (HOSPITAL_COMMUNITY): Payer: Self-pay

## 2020-11-17 ENCOUNTER — Emergency Department (HOSPITAL_COMMUNITY)
Admission: EM | Admit: 2020-11-17 | Discharge: 2020-11-18 | Disposition: A | Discharge status: Home / Self Care | Payer: Medicare Other | Attending: Emergency Medicine | Admitting: Emergency Medicine

## 2020-11-17 ENCOUNTER — Other Ambulatory Visit: Payer: Self-pay

## 2020-11-17 ENCOUNTER — Telehealth (HOSPITAL_COMMUNITY): Payer: Self-pay | Admitting: Licensed Clinical Social Worker

## 2020-11-17 ENCOUNTER — Encounter (HOSPITAL_COMMUNITY): Payer: Self-pay | Admitting: Emergency Medicine

## 2020-11-17 DIAGNOSIS — R45851 Suicidal ideations: Secondary | ICD-10-CM | POA: Diagnosis not present

## 2020-11-17 DIAGNOSIS — Z79899 Other long term (current) drug therapy: Secondary | ICD-10-CM | POA: Insufficient documentation

## 2020-11-17 DIAGNOSIS — Z9152 Personal history of nonsuicidal self-harm: Secondary | ICD-10-CM | POA: Insufficient documentation

## 2020-11-17 DIAGNOSIS — Z87891 Personal history of nicotine dependence: Secondary | ICD-10-CM | POA: Insufficient documentation

## 2020-11-17 DIAGNOSIS — U071 COVID-19: Secondary | ICD-10-CM | POA: Insufficient documentation

## 2020-11-17 DIAGNOSIS — F4310 Post-traumatic stress disorder, unspecified: Secondary | ICD-10-CM | POA: Insufficient documentation

## 2020-11-17 DIAGNOSIS — I10 Essential (primary) hypertension: Secondary | ICD-10-CM | POA: Insufficient documentation

## 2020-11-17 DIAGNOSIS — M5126 Other intervertebral disc displacement, lumbar region: Secondary | ICD-10-CM | POA: Insufficient documentation

## 2020-11-17 DIAGNOSIS — Z7982 Long term (current) use of aspirin: Secondary | ICD-10-CM | POA: Insufficient documentation

## 2020-11-17 DIAGNOSIS — Z791 Long term (current) use of non-steroidal anti-inflammatories (NSAID): Secondary | ICD-10-CM | POA: Insufficient documentation

## 2020-11-17 DIAGNOSIS — J449 Chronic obstructive pulmonary disease, unspecified: Secondary | ICD-10-CM | POA: Diagnosis not present

## 2020-11-17 DIAGNOSIS — F603 Borderline personality disorder: Secondary | ICD-10-CM | POA: Insufficient documentation

## 2020-11-17 DIAGNOSIS — R059 Cough, unspecified: Secondary | ICD-10-CM | POA: Diagnosis not present

## 2020-11-17 DIAGNOSIS — F332 Major depressive disorder, recurrent severe without psychotic features: Secondary | ICD-10-CM | POA: Insufficient documentation

## 2020-11-17 LAB — RAPID URINE DRUG SCREEN, HOSP PERFORMED
Amphetamines: NOT DETECTED
Barbiturates: NOT DETECTED
Benzodiazepines: NOT DETECTED
Cocaine: NOT DETECTED
Opiates: NOT DETECTED
Tetrahydrocannabinol: NOT DETECTED

## 2020-11-17 LAB — COMPREHENSIVE METABOLIC PANEL
ALT: 38 U/L (ref 0–44)
AST: 33 U/L (ref 15–41)
Albumin: 3.9 g/dL (ref 3.5–5.0)
Alkaline Phosphatase: 61 U/L (ref 38–126)
Anion gap: 11 (ref 5–15)
BUN: 12 mg/dL (ref 6–20)
CO2: 26 mmol/L (ref 22–32)
Calcium: 9.2 mg/dL (ref 8.9–10.3)
Chloride: 103 mmol/L (ref 98–111)
Creatinine, Ser: 1.07 mg/dL — ABNORMAL HIGH (ref 0.44–1.00)
GFR, Estimated: >60 mL/min (ref >60)
Glucose, Bld: 96 mg/dL (ref 70–99)
Potassium: 3.3 mmol/L — ABNORMAL LOW (ref 3.5–5.1)
Sodium: 140 mmol/L (ref 135–145)
Total Bilirubin: 1.1 mg/dL (ref 0.3–1.2)
Total Protein: 6.5 g/dL (ref 6.5–8.1)

## 2020-11-17 LAB — CBC
HCT: 41.8 % (ref 36.0–46.0)
Hemoglobin: 14.6 g/dL (ref 12.0–15.0)
MCH: 32.4 pg (ref 26.0–34.0)
MCHC: 34.9 g/dL (ref 30.0–36.0)
MCV: 92.9 fL (ref 80.0–100.0)
Platelets: 241 10*3/uL (ref 150–400)
RBC: 4.5 MIL/uL (ref 3.87–5.11)
RDW: 12 % (ref 11.5–15.5)
WBC: 8.6 10*3/uL (ref 4.0–10.5)
nRBC: 0 % (ref 0.0–0.2)

## 2020-11-17 LAB — SARS CORONAVIRUS 2 (TAT 6-24 HRS): SARS Coronavirus 2: POSITIVE — AB

## 2020-11-17 LAB — ETHANOL: Alcohol, Ethyl (B): <10 mg/dL (ref <10)

## 2020-11-17 LAB — SALICYLATE LEVEL: Salicylate Lvl: <7 mg/dL — ABNORMAL LOW (ref 7.0–30.0)

## 2020-11-17 LAB — I-STAT BETA HCG BLOOD, ED (MC, WL, AP ONLY): I-stat hCG, quantitative: <5 m[IU]/mL (ref <5)

## 2020-11-17 LAB — ACETAMINOPHEN LEVEL: Acetaminophen (Tylenol), Serum: 10 ug/mL (ref 10–30)

## 2020-11-17 MED ORDER — BENZONATATE 100 MG PO CAPS: 100.0000 mg | ORAL_CAPSULE | Freq: Three times a day (TID) | ORAL | 0 refills | Status: DC

## 2020-11-17 NOTE — ED Triage Notes (Signed)
Patient reports feeling depressed with panic attack and suicidal ideation today , she did not disclosed her plan of suicide at triage , denies hallucinations .

## 2020-11-17 NOTE — ED Provider Notes (Signed)
[facility]    CSN: [id number] Arrival date & time: 11/17/20  0818      History   Chief Complaint Chief Complaint  Patient presents with  . Cough    HPI Rebekah Davis is a 35 y.o. female.   Patient presents with nonproductive cough and request for COVID test.  She denies fever, sore throat, shortness of breath, vomiting, diarrhea, or other symptoms.  No treatments attempted at home.  She also reports suicidal ideation early this morning; she does not have a specific plan for harming herself.  She denies ideation for harming others.  She denies current suicidal thoughts; She verbally contracts for safety.  She lives in a group home and is monitored; her 'mentor' from group home is with her.  Patient states she was seen at [facility] behavioral health urgent care yesterday and was instructed to come back today.  Her medical history includes COPD, hypertension, herniated lumbar intervertebral disc, low back pain, PCOS, kidney stones, borderline personality disorder, anxiety, deliberate self cutting, depression with suicidal ideation, PTSD, obesity.  The history is provided by the patient and medical records.    Past Medical History:  Diagnosis Date  . Anxiety   . Arthritis   . Depression   . History of kidney stones   . Migraine   . PCOS (polycystic ovarian syndrome)   . PTSD (post-traumatic stress disorder)     Patient Active Problem List   Diagnosis Date Noted  . Passive suicidal ideations 11/16/2020  . Achilles tendinitis 03/11/2020  . Ovarian cyst 02/19/2020  . Deliberate self-cutting 01/23/2020  . Non-suicidal self harm as coping mechanism 12/25/2019  . Diarrhea 06/13/2018  . Hypomagnesemia 06/13/2018  . Transaminitis 06/13/2018  . C. difficile diarrhea 06/12/2018  . Oral thrush 06/12/2018  . Suicidal ideation 04/12/2018  . Chest pain 03/28/2018  . Obesity 03/28/2018  . OSA (obstructive sleep apnea) 03/28/2018  . Acute cystitis 03/10/2018  . GERD  (gastroesophageal reflux disease) 03/10/2018  . Depression with suicidal ideation 03/09/2018  . PTSD (post-traumatic stress disorder) 03/09/2018  . MDD (major depressive disorder), recurrent severe, 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/2018  . Polycystic disease, ovaries 07/05/2016  . Abdominal pain, suprapubic 06/04/2016  . Spondylisthesis 04/20/2015  . Herniated lumbar intervertebral disc 02/28/2013  . Sciatica of left side 02/28/2013  . Borderline personality disorder (HCC) 12/29/2012  . Anxiety 12/24/2012  . COPD (chronic obstructive pulmonary disease) (HCC) 12/24/2012  . Essential hypertension 12/24/2012  . Low back pain 07/22/2012    Past Surgical History:  Procedure Laterality Date  . TENOLYSIS Left 10/04/2020   Procedure: LEFT ACHILLES TENDON DEBRIDEMENT AND RECONSTRUCTION, CALCANEAL EXOSTECTOMY, GASTROCNEMIUS RECESSION,  FLEXOR HALLUCIS LONGUS TRANSFER;  Surgeon: Terance Hart, MD;  Location: [facility];  Service: Orthopedics;  Laterality: Left;  LENGTH OF SURGERY: 1.5 HOURS    OB History   No obstetric history on file.      Home Medications    Prior to Admission medications   Medication Sig Start Date End Date Taking? Authorizing Provider  benzonatate (TESSALON) 100 MG capsule Take 1 capsule (100 mg total) by mouth every 8 (eight) hours. 11/17/20  Yes Mickie Bail, NP  acetaminophen (TYLENOL) 500 MG tablet Take 1,000 mg by mouth every 8 (eight) hours as needed for moderate pain.    [provider]  ARIPiprazole (ABILIFY) 20 MG tablet Take 1 tablet (20 mg total) by mouth daily. 09/14/20 09/14/21  Barbette Merino, NP  aspirin (BAYER ASPIRIN) 325 MG tablet Take 1 tablet (  325 mg total) by mouth daily. 10/04/20 10/04/21  Terance Hart, MD  escitalopram (LEXAPRO) 10 MG tablet Take 1 tablet (10 mg total) by mouth 2 (two) times daily. Patient taking differently: Take 10 mg by mouth daily. Pt takes with 5 mg every morning = 15 mg 09/14/20 10/14/20  Barbette Merino, NP   escitalopram (LEXAPRO) 5 MG tablet Take 5 mg by mouth daily. Pt takes with 10 mg every morning = 15 mg    [provider]  meloxicam (MOBIC) 15 MG tablet Take 15 mg by mouth daily.    [provider]    Family History Family History  Problem Relation Age of Onset  . Hypertension Mother   . Diabetes Mother   . Kidney disease Mother   . Depression Mother   . Hypertension Father   . Diabetes Father     Social History Social History   Tobacco Use  . Smoking status: Former Games developer  . Smokeless tobacco: Never Used  Vaping Use  . Vaping Use: Never used  Substance Use Topics  . Alcohol use: Not Currently  . Drug use: Not Currently     Allergies   Adhesive [tape], Cherry, Other, Sulfamethoxazole-trimethoprim, Levofloxacin, Morphine, Gabapentin, Metformin, and Sumatriptan   Review of Systems Review of Systems  Constitutional: Negative for chills and fever.  HENT: Negative for ear pain and sore throat.   Eyes: Negative for pain and visual disturbance.  Respiratory: Positive for cough. Negative for shortness of breath.   Cardiovascular: Negative for chest pain and palpitations.  Gastrointestinal: Negative for abdominal pain and vomiting.  Genitourinary: Negative for dysuria and hematuria.  Musculoskeletal: Negative for arthralgias and back pain.  Skin: Negative for color change and rash.  Neurological: Negative for seizures and syncope.  Psychiatric/Behavioral: Positive for suicidal ideas.  All other systems reviewed and are negative.    Physical Exam Triage Vital Signs ED Triage Vitals  Enc Vitals Group     BP      Pulse      Resp      Temp      Temp src      SpO2      Weight      Height      Head Circumference      Peak Flow      Pain Score      Pain Loc      Pain Edu?      Excl. in GC?    No data found.  Updated Vital Signs BP (!) 146/76 (BP Location: Other (Comment)) Comment (BP Location): right forearm  Pulse (S) (!) 133 Comment: Pt  states baseline HR at Dr. office  Temp 98.4 F (36.9 C) (Oral)   LMP 11/08/2020   SpO2 100%   Visual Acuity Right Eye Distance:   Left Eye Distance:   Bilateral Distance:    Right Eye Near:   Left Eye Near:    Bilateral Near:     Physical Exam Vitals and nursing note reviewed.  Constitutional:      General: She is not in acute distress.    Appearance: She is well-developed and well-nourished. She is obese. She is not ill-appearing.  HENT:     Head: Normocephalic and atraumatic.     Mouth/Throat:     Mouth: Mucous membranes are moist.  Eyes:     Conjunctiva/sclera: Conjunctivae normal.  Cardiovascular:     Rate and Rhythm: Normal rate and regular rhythm.     Heart  sounds: Normal heart sounds.  Pulmonary:     Effort: Pulmonary effort is normal. No respiratory distress.     Breath sounds: Normal breath sounds.  Abdominal:     Palpations: Abdomen is soft.     Tenderness: There is no abdominal tenderness.  Musculoskeletal:        General: No edema.     Cervical back: Neck supple.  Skin:    General: Skin is warm and dry.  Neurological:     General: No focal deficit present.     Mental Status: She is alert and oriented to person, place, and time.     Gait: Gait normal.  Psychiatric:        Mood and Affect: Mood and affect, mood and affect normal.        Speech: Speech normal.        Behavior: Behavior normal.      UC Treatments / Results  Labs (all labs ordered are listed, but only abnormal results are displayed) Labs Reviewed  SARS CORONAVIRUS 2 (TAT 6-24 HRS)    EKG   Radiology No results found.  Procedures Procedures (including critical care time)  Medications Ordered in UC Medications - No data to display  Initial Impression / Assessment and Plan / UC Course  I have reviewed the triage vital signs and the nursing notes.  Pertinent labs & imaging results that were available during my care of the patient were reviewed by me and considered in my  medical decision making (see chart for details).   Cough.  Suicidal ideation.  Treating cough with Tessalon Perles.  PCR COVID pending.  Patient verbally contracts for safety; she denies current suicidal ideation; she does not have a plan for suicide; she agrees to notify the group home staff if she has suicidal thoughts again.  Instructed patient and her group home mentor to follow-up with her psychiatrist today.  They agree to plan of care.   Final Clinical Impressions(s) / UC Diagnoses   Final diagnoses:  Cough  Suicidal ideation     Discharge Instructions     Your COVID test is pending.  You should self quarantine until the test result is back.    Take Tylenol or ibuprofen as needed for fever or discomfort.  Take the [facility] as needed for cough.  Rest and keep yourself hydrated.    Follow-up with your primary care provider if your symptoms are not improving.        ED Prescriptions    Medication Sig Dispense Auth. Provider   benzonatate (TESSALON) 100 MG capsule Take 1 capsule (100 mg total) by mouth every 8 (eight) hours. 21 capsule Mickie Bail, NP     PDMP not reviewed this encounter.   Mickie Bail, NP 11/17/20 [phone]

## 2020-11-17 NOTE — Discharge Instructions (Addendum)
Your COVID test is pending.  You should self quarantine until the test result is back.    Take Tylenol or ibuprofen as needed for fever or discomfort.  Take the Tessalon Perles as needed for cough.  Rest and keep yourself hydrated.    Follow-up with your primary care provider if your symptoms are not improving.       

## 2020-11-17 NOTE — ED Notes (Signed)
Tresa Endo, NP was notified of pts suicide screening score. The provider obtained verbal contract from pt that she will not intend to hurt herself. Mentor was called back to sit with pt.

## 2020-11-17 NOTE — ED Triage Notes (Signed)
Patient presents to Urgent Care with complaints of a productive cough x 2 days. Pt reports he has a hx of bronchitis.    Denies fever, abdominal, n/v, or diarrhea.

## 2020-11-17 NOTE — ED Triage Notes (Signed)
Mentor was brought to sit with pt 1:1 Mentor report pt resides in a group home where she is supervised and never left alone.

## 2020-11-18 ENCOUNTER — Telehealth: Payer: Self-pay | Admitting: Infectious Diseases

## 2020-11-18 NOTE — BH Assessment (Addendum)
Comprehensive Clinical Assessment (CCA) Screening, Triage and Referral Note  11/18/2020 Rebekah Davis [id number] -Clinician reviewed note by Dr. Eudelia Bunch.  Reports history of self-mutilation in the past.  Denies any prior suicide attempts.  Denies any auditory/visual hallucinations.  No HI.  She was seen at behavioral health urgent care 2 days ago and was able to contract for safety then.  Instructed to call for appointment but there was no openings until 2 weeks from now.  Denies any other physical complaints.  Patient says that she lives at [facility].  She told staff that she needed to come in to hospital to have her medications checked.    Patient says she saw some of the knives in the kitchen tonight and had thoughs of wanting to cut herself. She has not cut since February '21.  She denies wanting to cut to kill herself.  Patient denies any previous sucide attempts.  No current SI.  Patient denies any HI or A/V hallucinations.  Patient says that when she had heel surgery at the end of December she did not take her psychiatric medications because she did not want them to interact with her pain meds.  She has been on half of the dosage that she took previously and feels she should get a prescription for the original dose.  Pt says she currently is taking 10mg  of Abilify and 10mg  of Lexapro currently  She said the previous dose of 20mg  Abilify and 15 of Lexapro worked well for her.    Patient says she has an med appointment with a new provider in [facility]) on 11/23/20.  She already has outpatient counseling provided by [facility].  -Clinician discussed patieent care with [facility] who recommends patient be psych cleared and return home.  Pt has outpatient services and appointments coming up.  Clinician notified Dr. 01/21/21 via IM.   Chief Complaint:  Chief Complaint  Patient presents with  . Suicidal   Visit Diagnosis: Generalized Anxiety D/O; Borderline  Personality D/O  Patient Reported Information How did you hear about [employer]? Other (Comment)   Referral name: [name]   Referral phone number: No data recorded Whom do you see for routine medical problems? Primary Care   Practice/Facility Name: [facility] 11/16/2020)   Practice/Facility Phone Number: No data recorded  Name of Contact: [name], academic, [name]   Contact Number: [phone] [name] 11/16/2020)   Contact Fax Number: [phone] [name] 11/16/2020)   Prescriber Name: [phone] ([employer]reesia 11/16/2020)   Prescriber Address (if known): [place] (Phreesia 11/16/2020)  What Is the Reason for Your Visit/Call Today? Pt was at [facility] this evening.  She saw some kitchen knives and had the impulse to cut herself.  She says she did not however.  Patient says that she did not have thoughts of killing herself with the knives, just cutting.  Pt last time of cutting on herself was February '21.  Pt has no previous suicide attempts.  Patient has hx of self harm.  She denies any HI.  Pt denies any A/V hallucinations.  Patient denies any SU issues now.  How Long Has This Been Causing You Problems? 1 wk - 1 month  Have You Recently Been in Any Inpatient Treatment (Hospital/Detox/Crisis Center/28-Day Program)? No   Name/Location of Program/Hospital:No data recorded  How Long Were You There? No data recorded  When Were You Discharged? No data recorded Have You Ever Received Services From [facility] Before? Yes   Who Do You See  at [facility]? [facility] assessment on 11/16/20  Have You Recently Had Any Thoughts About Hurting Yourself? Yes   Are You Planning to Commit Suicide/Harm Yourself At This time?  No  Have you Recently Had Thoughts About Hurting Someone [name]? No   Explanation: No data recorded Have You Used Any Alcohol or Drugs in the Past 24 Hours? No   How Long Ago Did You Use Drugs or Alcohol?  No data recorded  What Did You Use and How Much? No data  recorded What Do You Feel Would Help You the Most Today? Medication  Do You Currently Have a Therapist/Psychiatrist? Yes   Name of Therapist/Psychiatrist: Clydie Braun with [employer].; once a month she talks to a Ezzard Standing on the phone.   Have You Been Recently Discharged From Any Office Practice or Programs? No   Explanation of Discharge From Practice/Program:  No data recorded    CCA Screening Triage Referral Assessment Type of Contact: Tele-Assessment   Is this Initial or Reassessment? Initial Assessment   Date Telepsych consult ordered in [facility]:  11/18/2020   Time Telepsych consult ordered in [facility]:  No data recorded Patient Reported Information Reviewed? Yes   Patient Left Without Being Seen? No data recorded  Reason for Not Completing Assessment: No data recorded Collateral Involvement: No data recorded Does Patient Have a Court Appointed Legal Guardian? No data recorded  Name and Contact of Legal Guardian:  No data recorded If Minor and Not Living with Parent(s), Who has Custody? No data recorded Is CPS involved or ever been involved? Never  Is APS involved or ever been involved? Never  Patient Determined To Be At Risk for Harm To Self or Others Based on Review of Patient Reported Information or Presenting Complaint? No data recorded  Method: No data recorded  Availability of Means: No data recorded  Intent: No data recorded  Notification Required: No data recorded  Additional Information for Danger to Others Potential:  No data recorded  Additional Comments for Danger to Others Potential:  No data recorded  Are There Guns or Other Weapons in Your Home?  No data recorded   Types of Guns/Weapons: No data recorded   Are These Weapons Safely Secured?                              No data recorded   Who Could Verify You Are Able To Have These Secured:    No data recorded Do You Have any Outstanding Charges, Pending Court Dates, Parole/Probation? No data  recorded Contacted To Inform of Risk of Harm To Self or Others: No data recorded Location of Assessment: [facility] Services  Does Patient Present under Involuntary Commitment? No   IVC Papers Initial File Date: No data recorded  [place] of Residence: [place]  Patient Currently Receiving the Following Services: Medication Management; Individual Therapy (Med management currently through her PCP)   Determination of Need: Urgent (48 hours)   Options For Referral: Medication Management   [place], LCAS

## 2020-11-18 NOTE — ED Provider Notes (Addendum)
[facility] EMERGENCY DEPARTMENT Provider Note  CSN: [id number] Arrival date & time: 11/17/20 2156  Chief Complaint(s) Suicidal  HPI Rebekah Davis is a 35 y.o. female with a past medical history listed below including anxiety and depression who presents to the emergency department with worsening depression and suicidal ideations and thoughts of self-mutilation. Currently feels calm but reports that her anxiety and thoughts come out of nowhere and are sometimes hard to control.  Reports history of self-mutilation in the past.  Denies any prior suicide attempts.  Denies any auditory/visual hallucinations.  No HI.  She was seen at behavioral health urgent care 2 days ago and was able to contract for safety then.  Instructed to call for appointment but there was no openings until 2 weeks from now.  Denies any other physical complaints.   HPI  Past Medical History Past Medical History:  Diagnosis Date  . Anxiety   . Arthritis   . Depression   . History of kidney stones   . Migraine   . PCOS (polycystic ovarian syndrome)   . PTSD (post-traumatic stress disorder)    Patient Active Problem List   Diagnosis Date Noted  . Passive suicidal ideations 11/16/2020  . Achilles tendinitis 03/11/2020  . Ovarian cyst 02/19/2020  . Deliberate self-cutting 01/23/2020  . Non-suicidal self harm as coping mechanism 12/25/2019  . Diarrhea 06/13/2018  . Hypomagnesemia 06/13/2018  . Transaminitis 06/13/2018  . C. difficile diarrhea 06/12/2018  . Oral thrush 06/12/2018  . Suicidal ideation 04/12/2018  . Chest pain 03/28/2018  . Obesity 03/28/2018  . OSA (obstructive sleep apnea) 03/28/2018  . Acute cystitis 03/10/2018  . GERD (gastroesophageal reflux disease) 03/10/2018  . Depression with suicidal ideation 03/09/2018  . PTSD (post-traumatic stress disorder) 03/09/2018  . MDD (major depressive disorder), recurrent severe, 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/2018  . Polycystic disease,  ovaries 07/05/2016  . Abdominal pain, suprapubic 06/04/2016  . Spondylisthesis 04/20/2015  . Herniated lumbar intervertebral disc 02/28/2013  . Sciatica of left side 02/28/2013  . Borderline personality disorder (HCC) 12/29/2012  . Anxiety 12/24/2012  . COPD (chronic obstructive pulmonary disease) (HCC) 12/24/2012  . Essential hypertension 12/24/2012  . Low back pain 07/22/2012   Home Medication(s) Prior to Admission medications   Medication Sig Start Date End Date Taking? Authorizing Provider  acetaminophen (TYLENOL) 500 MG tablet Take 1,000 mg by mouth every 8 (eight) hours as needed for moderate pain.   Yes [provider]  ARIPiprazole (ABILIFY) 20 MG tablet Take 1 tablet (20 mg total) by mouth daily. Patient taking differently: Take 10 mg by mouth daily. 09/14/20 09/14/21 Yes Barbette Merino, NP  aspirin (BAYER ASPIRIN) 325 MG tablet Take 1 tablet (325 mg total) by mouth daily. 10/04/20 10/04/21 Yes Terance Hart, MD  benzonatate (TESSALON) 100 MG capsule Take 1 capsule (100 mg total) by mouth every 8 (eight) hours. 11/17/20  Yes Mickie Bail, NP  escitalopram (LEXAPRO) 10 MG tablet Take 1 tablet (10 mg total) by mouth 2 (two) times daily. Patient taking differently: Take 10 mg by mouth daily. 09/14/20 10/14/20 Yes Barbette Merino, NP  Past Surgical History Past Surgical History:  Procedure Laterality Date  . TENOLYSIS Left 10/04/2020   Procedure: LEFT ACHILLES TENDON DEBRIDEMENT AND RECONSTRUCTION, CALCANEAL EXOSTECTOMY, GASTROCNEMIUS RECESSION,  FLEXOR HALLUCIS LONGUS TRANSFER;  Surgeon: Terance Hart, MD;  Location: [facility] OR;  Service: Orthopedics;  Laterality: Left;  LENGTH OF SURGERY: 1.5 HOURS   Family History Family History  Problem Relation Age of Onset  . Hypertension Mother   . Diabetes Mother   . Kidney disease Mother   .  Depression Mother   . Hypertension Father   . Diabetes Father     Social History Social History   Tobacco Use  . Smoking status: Former Games developer  . Smokeless tobacco: Never Used  Vaping Use  . Vaping Use: Never used  Substance Use Topics  . Alcohol use: Not Currently  . Drug use: Not Currently   Allergies Adhesive [tape], Cherry, Other, Sulfamethoxazole-trimethoprim, Levofloxacin, Morphine, Gabapentin, Metformin, and Sumatriptan  Review of Systems Review of Systems All other systems are reviewed and are negative for acute change except as noted in the HPI  Physical Exam Vital Signs  I have reviewed the triage vital signs BP 121/67 (BP Location: Right Arm)   Pulse (!) 103   Temp 98.9 F (37.2 C) (Oral)   Resp 16   Ht 5\' 5"  (1.651 m)   Wt (!) 160 kg   LMP 11/10/2020   SpO2 99%   BMI 58.70 kg/m   Physical Exam Vitals reviewed.  Constitutional:      General: She is not in acute distress.    Appearance: She is well-developed and well-nourished. She is obese. She is not diaphoretic.  HENT:     Head: Normocephalic and atraumatic.     Right Ear: External ear normal.     Left Ear: External ear normal.     Nose: Nose normal.  Eyes:     General: No scleral icterus.    Extraocular Movements: EOM normal.     Conjunctiva/sclera: Conjunctivae normal.  Neck:     Trachea: Phonation normal.  Cardiovascular:     Rate and Rhythm: Normal rate and regular rhythm.  Pulmonary:     Effort: Pulmonary effort is normal. No respiratory distress.     Breath sounds: No stridor.  Abdominal:     General: There is no distension.  Musculoskeletal:        General: No edema. Normal range of motion.     Cervical back: Normal range of motion.       Legs:  Neurological:     Mental Status: She is alert and oriented to person, place, and time.  Psychiatric:        Mood and Affect: Mood and affect normal.        Behavior: Behavior normal.     ED Results and Treatments Labs (all labs  ordered are listed, but only abnormal results are displayed) Labs Reviewed  COMPREHENSIVE METABOLIC PANEL - Abnormal; Notable for the following components:      Result Value   Potassium 3.3 (*)    Creatinine, Ser 1.07 (*)    All other components within normal limits  SALICYLATE LEVEL - Abnormal; Notable for the following components:   Salicylate Lvl <7.0 (*)    All other components within normal limits  ETHANOL  ACETAMINOPHEN LEVEL  CBC  RAPID URINE DRUG SCREEN, HOSP PERFORMED  I-STAT BETA HCG BLOOD, ED ([facility], [facility], [facility] ONLY)  EKG  EKG Interpretation  Date/Time:    Ventricular Rate:    PR Interval:    QRS Duration:   QT Interval:    QTC Calculation:   R Axis:     Text Interpretation:        Radiology No results found.  Pertinent labs & imaging results that were available during my care of the patient were reviewed by me and considered in my medical decision making (see chart for details).  Medications Ordered in ED Medications - No data to display                                                                                                                                  Procedures Procedures  (including critical care time)  Medical Decision Making / ED Course I have reviewed the nursing notes for this encounter and the patient's prior records (if available in EHR or on provided paperwork).   Rebekah Davis was evaluated in Emergency Department on 11/18/2020 for the symptoms described in the history of present illness. She was evaluated in the context of the global COVID-19 pandemic, which necessitated consideration that the patient might be at risk for infection with the SARS-CoV-2 virus that causes COVID-19. Institutional protocols and algorithms that pertain to the evaluation of patients at risk for COVID-19 are in a state of rapid change based on  information released by regulatory bodies including the CDC and federal and state organizations. These policies and algorithms were followed during the patient's care in the ED.  Patient presents with suicidal ideation. No active plans. No other physical complaints. Medically cleared for behavioral health evaluation and dispo.     Patient was cleared by psychiatry for outpatient management  Final Clinical Impression(s) / ED Diagnoses Final diagnoses:  Suicidal ideation   The patient appears reasonably screened and/or stabilized for discharge and I doubt any other medical condition or other [facility] requiring further screening, evaluation, or treatment in the ED at this time prior to discharge. Safe for discharge with strict return precautions.  Disposition: Discharge  Condition: Good  I have discussed the results, Dx and Tx plan with the patient/family who expressed understanding and agree(s) with the plan. Discharge instructions discussed at length. The patient/family was given strict return precautions who verbalized understanding of the instructions. No further questions at time of discharge.    ED Discharge Orders    None       This chart was dictated using voice recognition software.  Despite best efforts to proofread,  errors can occur which can change the documentation meaning.     Nira Conn, MD 11/18/20 [phone]

## 2020-11-18 NOTE — Telephone Encounter (Addendum)
Called to discuss with patient about COVID-19 symptoms and the use of one of the available treatments for those with mild to moderate Covid symptoms and at a high risk of hospitalization.  Pt appears to qualify for outpatient treatment due to co-morbid conditions and/or a member of an at-risk group in accordance with the FDA Emergency Use Authorization.    Symptom onset: 2/01 or 2/02 per chart  Vaccinated: no Booster?  Immunocompromised? no Qualifiers: BMI > 30, SVI risk 4, unvaccinated, depression  Unable to reach pt - left generic voicemail at group home for call back. Sent MyChart message as well. May be able to offer paxlovid pending medication review vs monoclonal Ab therapy   Rexene Alberts, NP

## 2020-11-18 NOTE — ED Notes (Signed)
BH assessment in progress via computer.

## 2020-11-18 NOTE — ED Notes (Addendum)
Pt states that she had suicidal ideations at dinner tonight. She stated that the knives on the table were really hard to resist. Has a history of cutting, but states that she has not cut since February. Reports that she has been on half the normal dose of her medication since surgery and needs to get back on her full dose.

## 2020-11-18 NOTE — Discharge Instructions (Addendum)
Please follow up with the following facility for Partial Hospitalization Program:   [facility] at [facility] [address] [place], [place], [place] [address] Hours: 7am-5pm Phone: [phone]   [facility](2nd Floor) [address] [place], [place] [address] [phone]

## 2020-11-18 NOTE — Telephone Encounter (Signed)
I spoke with [facility] about treatment options. She will take time to review and let our team know if she would like to try any treatment.  Eligible for oral therapy through 2/5 start  Eligible for IV therapy through 2/07   Rexene Alberts, MSN, NP-C [facility]  [email or web address] Pager: [phone] Office: [phone] [facility] Main Line: [phone]

## 2020-11-18 NOTE — ED Notes (Signed)
Breakfast Ordered 

## 2020-12-22 ENCOUNTER — Other Ambulatory Visit: Payer: Self-pay | Admitting: Nurse Practitioner

## 2021-01-03 ENCOUNTER — Telehealth: Payer: Self-pay | Admitting: Nurse Practitioner

## 2021-01-03 NOTE — Telephone Encounter (Signed)
Patient requests refill on Lexipril (spelling?) 5mg  and 10mg  to be sent to [facility] in [place]. If she needs an appointment, please advise and I will schedule.

## 2021-01-18 ENCOUNTER — Other Ambulatory Visit: Payer: Self-pay

## 2021-01-18 ENCOUNTER — Ambulatory Visit (INDEPENDENT_AMBULATORY_CARE_PROVIDER_SITE_OTHER): Payer: Medicare Other | Admitting: Nurse Practitioner

## 2021-01-18 ENCOUNTER — Encounter: Payer: Self-pay | Admitting: Nurse Practitioner

## 2021-01-18 VITALS — BP 132/68 | HR 93 | Ht 65.0 in | Wt 319.0 lb

## 2021-01-18 DIAGNOSIS — F419 Anxiety disorder, unspecified: Secondary | ICD-10-CM | POA: Diagnosis not present

## 2021-01-18 DIAGNOSIS — F332 Major depressive disorder, recurrent severe without psychotic features: Secondary | ICD-10-CM

## 2021-01-18 DIAGNOSIS — Z6841 Body Mass Index (BMI) 40.0 and over, adult: Secondary | ICD-10-CM | POA: Diagnosis not present

## 2021-01-18 NOTE — Patient Instructions (Addendum)
Mallie Darting My heart sings    Healthy Eating Following a healthy eating pattern may help you to achieve and maintain a healthy body weight, reduce the risk of chronic disease, and live a long and productive life. It is important to follow a healthy eating pattern at an appropriate calorie level for your body. Your nutritional needs should be met primarily through food by choosing a variety of nutrient-rich foods. What are tips for following this plan? Reading food labels  Read labels and choose the following: ? Reduced or low sodium. ? Juices with 100% fruit juice. ? Foods with low saturated fats and high polyunsaturated and monounsaturated fats. ? Foods with whole grains, such as whole wheat, cracked wheat, brown rice, and wild rice. ? Whole grains that are fortified with folic acid. This is recommended for women who are pregnant or who want to become pregnant.  Read labels and avoid the following: ? Foods with a lot of added sugars. These include foods that contain brown sugar, corn sweetener, corn syrup, dextrose, fructose, glucose, high-fructose corn syrup, honey, invert sugar, lactose, malt syrup, maltose, molasses, raw sugar, sucrose, trehalose, or turbinado sugar.  Do not eat more than the following amounts of added sugar per day:  6 teaspoons (25 g) for women.  9 teaspoons (38 g) for men. ? Foods that contain processed or refined starches and grains. ? Refined grain products, such as white flour, degermed cornmeal, white bread, and white rice. Shopping  Choose nutrient-rich snacks, such as vegetables, whole fruits, and nuts. Avoid high-calorie and high-sugar snacks, such as potato chips, fruit snacks, and candy.  Use oil-based dressings and spreads on foods instead of solid fats such as butter, stick margarine, or cream cheese.  Limit pre-made sauces, mixes, and "instant" products such as flavored rice, instant noodles, and ready-made pasta.  Try more plant-protein  sources, such as tofu, tempeh, black beans, edamame, lentils, nuts, and seeds.  Explore eating plans such as the Mediterranean diet or vegetarian diet. Cooking  Use oil to saut or stir-fry foods instead of solid fats such as butter, stick margarine, or lard.  Try baking, boiling, grilling, or broiling instead of frying.  Remove the fatty part of meats before cooking.  Steam vegetables in water or broth. Meal planning  At meals, imagine dividing your plate into fourths: ? One-half of your plate is fruits and vegetables. ? One-fourth of your plate is whole grains. ? One-fourth of your plate is protein, especially lean meats, poultry, eggs, tofu, beans, or nuts.  Include low-fat dairy as part of your daily diet.   Lifestyle  Choose healthy options in all settings, including home, work, school, restaurants, or stores.  Prepare your food safely: ? Wash your hands after handling raw meats. ? Keep food preparation surfaces clean by regularly washing with hot, soapy water. ? Keep raw meats separate from ready-to-eat foods, such as fruits and vegetables. ? Cook seafood, meat, poultry, and eggs to the recommended internal temperature. ? Store foods at safe temperatures. In general:  Keep cold foods at 74F (4.4C) or below.  Keep hot foods at 174F (60C) or above.  Keep your freezer at [facility] (-17.8C) or below.  Foods are no longer safe to eat when they have been between the temperatures of 40-174F (4.4-60C) for more than 2 hours. What foods should I eat? Fruits Aim to eat 2 cup-equivalents of fresh, canned (in natural juice), or frozen fruits each day. Examples of 1 cup-equivalent of fruit include 1 small apple,  8 large strawberries, 1 cup canned fruit,  cup dried fruit, or 1 cup 100% juice. Vegetables Aim to eat 2-3 cup-equivalents of fresh and frozen vegetables each day, including different varieties and colors. Examples of 1 cup-equivalent of vegetables include 2 medium  carrots, 2 cups raw, leafy greens, 1 cup chopped vegetable (raw or cooked), or 1 medium baked potato. Grains Aim to eat 6 ounce-equivalents of whole grains each day. Examples of 1 ounce-equivalent of grains include 1 slice of bread, 1 cup ready-to-eat cereal, 3 cups popcorn, or  cup cooked rice, pasta, or cereal. Meats and other proteins Aim to eat 5-6 ounce-equivalents of protein each day. Examples of 1 ounce-equivalent of protein include 1 egg, 1/2 cup nuts or seeds, or 1 tablespoon (16 g) peanut butter. A cut of meat or fish that is the size of a deck of cards is about 3-4 ounce-equivalents.  Of the protein you eat each week, try to have at least 8 ounces come from seafood. This includes salmon, trout, herring, and anchovies. Dairy Aim to eat 3 cup-equivalents of fat-free or low-fat dairy each day. Examples of 1 cup-equivalent of dairy include 1 cup (240 mL) milk, 8 ounces (250 g) yogurt, 1 ounces (44 g) natural cheese, or 1 cup (240 mL) fortified soy milk. Fats and oils  Aim for about 5 teaspoons (21 g) per day. Choose monounsaturated fats, such as canola and olive oils, avocados, peanut butter, and most nuts, or polyunsaturated fats, such as sunflower, corn, and soybean oils, walnuts, pine nuts, sesame seeds, sunflower seeds, and flaxseed. Beverages  Aim for six 8-oz glasses of water per day. Limit coffee to three to five 8-oz cups per day.  Limit caffeinated beverages that have added calories, such as soda and energy drinks.  Limit alcohol intake to no more than 1 drink a day for nonpregnant women and 2 drinks a day for men. One drink equals 12 oz of beer (355 mL), 5 oz of wine (148 mL), or 1 oz of hard liquor (44 mL). Seasoning and other foods  Avoid adding excess amounts of salt to your foods. Try flavoring foods with herbs and spices instead of salt.  Avoid adding sugar to foods.  Try using oil-based dressings, sauces, and spreads instead of solid fats. This information is based  on general [place] nutrition guidelines. For more information, visit BuildDNA.es. Exact amounts may vary based on your nutrition needs. Summary  A healthy eating plan may help you to maintain a healthy weight, reduce the risk of chronic diseases, and stay active throughout your life.  Plan your meals. Make sure you eat the right portions of a variety of nutrient-rich foods.  Try baking, boiling, grilling, or broiling instead of frying.  Choose healthy options in all settings, including home, work, school, restaurants, or stores. This information is not intended to replace advice given to you by your health care provider. Make sure you discuss any questions you have with your health care provider. Document Revised: 01/13/2018 Document Reviewed: 01/13/2018 Elsevier Patient Education  2021 [employer] for Nurse Practitioners, 15(4), [id number]. Retrieved July 21, 2018 from http://clinicalkey.com/nursing">  Knee Exercises Ask your health care provider which exercises are safe for you. Do exercises exactly as told by your health care provider and adjust them as directed. It is normal to feel mild stretching, pulling, tightness, or discomfort as you do these exercises. Stop right away if you feel sudden pain or your pain gets worse. Do not begin these exercises  until told by your health care provider. Stretching and range-of-motion exercises These exercises warm up your muscles and joints and improve the movement and flexibility of your knee. These exercises also help to relieve pain and swelling. Knee extension, prone 1. Lie on your abdomen (prone position) on a bed. 2. Place your left / right knee just beyond the edge of the surface so your knee is not on the bed. You can put a towel under your left / right thigh just above your kneecap for comfort. 3. Relax your leg muscles and allow gravity to straighten your knee (extension). You should feel a stretch behind your left / right  knee. 4. Hold this position for __________ seconds. 5. Scoot up so your knee is supported between repetitions. Repeat __________ times. Complete this exercise __________ times a day. Knee flexion, active 1. Lie on your back with both legs straight. If this causes back discomfort, bend your left / right knee so your foot is flat on the floor. 2. Slowly slide your left / right heel back toward your buttocks. Stop when you feel a gentle stretch in the front of your knee or thigh (flexion). 3. Hold this position for __________ seconds. 4. Slowly slide your left / right heel back to the starting position. Repeat __________ times. Complete this exercise __________ times a day.   Quadriceps stretch, prone 1. Lie on your abdomen on a firm surface, such as a bed or padded floor. 2. Bend your left / right knee and hold your ankle. If you cannot reach your ankle or pant leg, loop a belt around your foot and grab the belt instead. 3. Gently pull your heel toward your buttocks. Your knee should not slide out to the side. You should feel a stretch in the front of your thigh and knee (quadriceps). 4. Hold this position for __________ seconds. Repeat __________ times. Complete this exercise __________ times a day.   Hamstring, supine 1. Lie on your back (supine position). 2. Loop a belt or towel over the ball of your left / right foot. The ball of your foot is on the walking surface, right under your toes. 3. Straighten your left / right knee and slowly pull on the belt to raise your leg until you feel a gentle stretch behind your knee (hamstring). ? Do not let your knee bend while you do this. ? Keep your other leg flat on the floor. 4. Hold this position for __________ seconds. Repeat __________ times. Complete this exercise __________ times a day. Strengthening exercises These exercises build strength and endurance in your knee. Endurance is the ability to use your muscles for a long time, even after  they get tired. Quadriceps, isometric This exercise stretches the muscles in front of your thigh (quadriceps) without moving your knee joint (isometric). 1. Lie on your back with your left / right leg extended and your other knee bent. Put a rolled towel or small pillow under your knee if told by your health care provider. 2. Slowly tense the muscles in the front of your left / right thigh. You should see your kneecap slide up toward your hip or see increased dimpling just above the knee. This motion will push the back of the knee toward the floor. 3. For __________ seconds, hold the muscle as tight as you can without increasing your pain. 4. Relax the muscles slowly and completely. Repeat __________ times. Complete this exercise __________ times a day.   Straight leg raises This exercise  stretches the muscles in front of your thigh (quadriceps) and the muscles that move your hips (hip flexors). 1. Lie on your back with your left / right leg extended and your other knee bent. 2. Tense the muscles in the front of your left / right thigh. You should see your kneecap slide up or see increased dimpling just above the knee. Your thigh may even shake a bit. 3. Keep these muscles tight as you raise your leg 4-6 inches (10-15 cm) off the floor. Do not let your knee bend. 4. Hold this position for __________ seconds. 5. Keep these muscles tense as you lower your leg. 6. Relax your muscles slowly and completely after each repetition. Repeat __________ times. Complete this exercise __________ times a day. Hamstring, isometric 1. Lie on your back on a firm surface. 2. Bend your left / right knee about __________ degrees. 3. Dig your left / right heel into the surface as if you are trying to pull it toward your buttocks. Tighten the muscles in the back of your thighs (hamstring) to "dig" as hard as you can without increasing any pain. 4. Hold this position for __________ seconds. 5. Release the tension  gradually and allow your muscles to relax completely for __________ seconds after each repetition. Repeat __________ times. Complete this exercise __________ times a day. Hamstring curls If told by your health care provider, do this exercise while wearing ankle weights. Begin with __________ lb weights. Then increase the weight by 1 lb (0.5 kg) increments. Do not wear ankle weights that are more than __________ lb. 1. Lie on your abdomen with your legs straight. 2. Bend your left / right knee as far as you can without feeling pain. Keep your hips flat against the floor. 3. Hold this position for __________ seconds. 4. Slowly lower your leg to the starting position. Repeat __________ times. Complete this exercise __________ times a day.   Squats This exercise strengthens the muscles in front of your thigh and knee (quadriceps). 1. Stand in front of a table, with your feet and knees pointing straight ahead. You may rest your hands on the table for balance but not for support. 2. Slowly bend your knees and lower your hips like you are going to sit in a chair. ? Keep your weight over your heels, not over your toes. ? Keep your lower legs upright so they are parallel with the table legs. ? Do not let your hips go lower than your knees. ? Do not bend lower than told by your health care provider. ? If your knee pain increases, do not bend as low. 3. Hold the squat position for __________ seconds. 4. Slowly push with your legs to return to standing. Do not use your hands to pull yourself to standing. Repeat __________ times. Complete this exercise __________ times a day. Wall slides This exercise strengthens the muscles in front of your thigh and knee (quadriceps). 1. Lean your back against a smooth wall or door, and walk your feet out 18-24 inches (46-61 cm) from it. 2. Place your feet hip-width apart. 3. Slowly slide down the wall or door until your knees bend __________ degrees. Keep your knees  over your heels, not over your toes. Keep your knees in line with your hips. 4. Hold this position for __________ seconds. Repeat __________ times. Complete this exercise __________ times a day.   Straight leg raises This exercise strengthens the muscles that rotate the leg at the hip and move it  away from your body (hip abductors). 1. Lie on your side with your left / right leg in the top position. Lie so your head, shoulder, knee, and hip line up. You may bend your bottom knee to help you keep your balance. 2. Roll your hips slightly forward so your hips are stacked directly over each other and your left / right knee is facing forward. 3. Leading with your heel, lift your top leg 4-6 inches (10-15 cm). You should feel the muscles in your outer hip lifting. ? Do not let your foot drift forward. ? Do not let your knee roll toward the ceiling. 4. Hold this position for __________ seconds. 5. Slowly return your leg to the starting position. 6. Let your muscles relax completely after each repetition. Repeat __________ times. Complete this exercise __________ times a day.   Straight leg raises This exercise stretches the muscles that move your hips away from the front of the pelvis (hip extensors). 1. Lie on your abdomen on a firm surface. You can put a pillow under your hips if that is more comfortable. 2. Tense the muscles in your buttocks and lift your left / right leg about 4-6 inches (10-15 cm). Keep your knee straight as you lift your leg. 3. Hold this position for __________ seconds. 4. Slowly lower your leg to the starting position. 5. Let your leg relax completely after each repetition. Repeat __________ times. Complete this exercise __________ times a day. This information is not intended to replace advice given to you by your health care provider. Make sure you discuss any questions you have with your health care provider. Document Revised: 07/22/2018 Document Reviewed:  07/22/2018 Elsevier Patient Education  2021 [employer].
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
## 2021-01-19 ENCOUNTER — Encounter: Payer: Self-pay | Admitting: Nurse Practitioner

## 2021-02-28 ENCOUNTER — Other Ambulatory Visit: Payer: Self-pay | Admitting: Nurse Practitioner

## 2021-03-06 ENCOUNTER — Other Ambulatory Visit: Payer: Self-pay

## 2021-03-06 ENCOUNTER — Encounter: Payer: Self-pay | Admitting: Nurse Practitioner

## 2021-03-06 ENCOUNTER — Ambulatory Visit (INDEPENDENT_AMBULATORY_CARE_PROVIDER_SITE_OTHER): Payer: Medicare Other | Admitting: Nurse Practitioner

## 2021-03-06 VITALS — BP 155/85 | HR 108 | Temp 107.0°F | Ht 65.0 in | Wt 311.0 lb

## 2021-03-06 DIAGNOSIS — F332 Major depressive disorder, recurrent severe without psychotic features: Secondary | ICD-10-CM | POA: Diagnosis not present

## 2021-03-06 DIAGNOSIS — F419 Anxiety disorder, unspecified: Secondary | ICD-10-CM

## 2021-03-06 DIAGNOSIS — Z6841 Body Mass Index (BMI) 40.0 and over, adult: Secondary | ICD-10-CM | POA: Diagnosis not present

## 2021-03-06 MED ORDER — ESCITALOPRAM OXALATE 10 MG PO TABS: 10.0000 mg | ORAL_TABLET | Freq: Every day | ORAL | 11 refills | Status: DC

## 2021-03-06 MED ORDER — ESCITALOPRAM OXALATE 5 MG PO TABS: 5.0000 mg | ORAL_TABLET | Freq: Every day | ORAL | 11 refills | Status: DC

## 2021-03-06 NOTE — Patient Instructions (Addendum)

## 2021-03-06 NOTE — Progress Notes (Signed)
[facility] Patient [facility] [address] [place], [place]  [address] Phone:  [phone]   Fax:  [phone]   Established Patient Office Visit  Subjective:  Patient ID: Rebekah Davis, female    DOB: [birth year]  Age: 35 y.o. MRN: [id number]  CC:  Chief Complaint  Patient presents with  . Follow-up    Needs Lexapro 10 mg/daily plus 5 mg daily. Scheduled to see physiatrist in Aug.    HPI Rebekah Davis presents for follow up. She  has a past medical history of Anxiety, Arthritis, Depression, History of kidney stones, Migraine, PCOS (polycystic ovarian syndrome), and PTSD (post-traumatic stress disorder).   She has graduated from [employer]. She is in the second step. She is dealing with her moms estate.  She has been increasingly busy over the last week.  She reports being overwhelmed a little with the added freedom and responsibility. She is actively looking for employeement. She is on Lexapro 10 mg plus 5 mg. She reports that this is a better dose for her. She is will follow up with [facility]. She reports being on Vistaril in the past which was effective. She is not able to get this at her current resident.  She is very anxious today because she had to follow up. She feels like this is why her BP and HR are elevated. She continues to make good lifestyle choices and is losing weight.   Past Medical History:  Diagnosis Date  . Anxiety   . Arthritis   . Depression   . History of kidney stones   . Migraine   . PCOS (polycystic ovarian syndrome)   . PTSD (post-traumatic stress disorder)     Past Surgical History:  Procedure Laterality Date  . TENOLYSIS Left 10/04/2020   Procedure: LEFT ACHILLES TENDON DEBRIDEMENT AND RECONSTRUCTION, CALCANEAL EXOSTECTOMY, GASTROCNEMIUS RECESSION,  FLEXOR HALLUCIS LONGUS TRANSFER;  Surgeon: Terance Hart, MD;  Location: [facility] OR;  Service: Orthopedics;  Laterality: Left;  LENGTH OF SURGERY: 1.5 HOURS    Family History   Problem Relation Age of Onset  . Hypertension Mother   . Diabetes Mother   . Kidney disease Mother   . Depression Mother   . Hypertension Father   . Diabetes Father     Social History   Socioeconomic History  . Marital status: Single    Spouse name: Not on file  . Number of children: 0  . Years of education: Not on file  . Highest education level: Bachelor's degree (e.g., BA, AB, BS)  Occupational History  . Occupation: unemployed  Tobacco Use  . Smoking status: Former Games developer  . Smokeless tobacco: Never Used  Vaping Use  . Vaping Use: Never used  Substance and Sexual Activity  . Alcohol use: Not Currently  . Drug use: Not Currently  . Sexual activity: Not Currently  Other Topics Concern  . Not on file  Social History Narrative  . Not on file   Social Determinants of Health   Financial Resource Strain: Not on file  Food Insecurity: Not on file  Transportation Needs: Not on file  Physical Activity: Not on file  Stress: Not on file  Social Connections: Not on file  Intimate Partner Violence: Not on file    Outpatient Medications Prior to Visit  Medication Sig Dispense Refill  . acetaminophen (TYLENOL) 500 MG tablet Take 1,000 mg by mouth every 8 (eight) hours as needed for moderate pain.    . ARIPiprazole (ABILIFY) 20  MG tablet Take 1 tablet (20 mg total) by mouth daily. 30 tablet 11  . escitalopram (LEXAPRO) 10 MG tablet TAKE 1 TABLET(10 MG) BY MOUTH TWICE DAILY (Patient taking differently: Take 10 mg by mouth daily.) 60 tablet 11  . escitalopram (LEXAPRO) 5 MG tablet Take 5 mg by mouth daily.    Marland Kitchen aspirin (BAYER ASPIRIN) 325 MG tablet Take 1 tablet (325 mg total) by mouth daily. 100 tablet 3   No facility-administered medications prior to visit.    Allergies  Allergen Reactions  . Adhesive [Tape] Other (See Comments)    blister  . Cherry Anaphylaxis and Rash  . Other Hives and Rash  . Sulfamethoxazole-Trimethoprim Hives  . Levofloxacin     Other  reaction(s): Muscle Pain, Other (See Comments)  . Morphine     Other reaction(s): Other (See Comments) Feels like unable to breath or swallow   . Gabapentin Rash  . Metformin Nausea And Vomiting    Other reaction(s): Other (See Comments) diaphoresis   . Sumatriptan Nausea And Vomiting    Other reaction(s): Other (See Comments) Decreased heart rate and respiratory rate Decreased heart rate and respiratory rate     ROS Review of Systems    Objective:    Physical Exam Constitutional:      Appearance: She is obese.  HENT:     Head: Normocephalic and atraumatic.  Cardiovascular:     Rate and Rhythm: Normal rate and regular rhythm.     Pulses: Normal pulses.     Heart sounds: Normal heart sounds.  Pulmonary:     Effort: Pulmonary effort is normal.     Breath sounds: Normal breath sounds.  Musculoskeletal:        General: Normal range of motion.     Cervical back: Normal range of motion.  Skin:    General: Skin is warm and dry.     Capillary Refill: Capillary refill takes less than 2 seconds.  Neurological:     General: No focal deficit present.     Mental Status: She is alert and oriented to person, place, and time.  Psychiatric:        Thought Content: Thought content normal.        Judgment: Judgment normal.     BP (!) 155/85 (BP Location: Right Arm)   Pulse (!) 108   Temp (!) 107 F (41.7 C)   Ht 5\' 5"  (1.651 m)   Wt (!) 311 lb (141.1 kg)   LMP 02/14/2021   SpO2 100%   BMI 51.75 kg/m  Wt Readings from Last 3 Encounters:  03/06/21 (!) 311 lb (141.1 kg)  01/18/21 (!) 319 lb (144.7 kg)  11/17/20 (!) 352 lb 11.8 oz (160 kg)     There are no preventive care reminders to display for this patient.  There are no preventive care reminders to display for this patient.  Lab Results  Component Value Date   TSH 1.380 03/11/2020   Lab Results  Component Value Date   WBC 8.6 11/17/2020   HGB 14.6 11/17/2020   HCT 41.8 11/17/2020   MCV 92.9 11/17/2020    PLT 241 11/17/2020   Lab Results  Component Value Date   NA 140 11/17/2020   K 3.3 (L) 11/17/2020   CO2 26 11/17/2020   GLUCOSE 96 11/17/2020   BUN 12 11/17/2020   CREATININE 1.07 (H) 11/17/2020   BILITOT 1.1 11/17/2020   ALKPHOS 61 11/17/2020   AST 33 11/17/2020   ALT 38 11/17/2020  PROT 6.5 11/17/2020   ALBUMIN 3.9 11/17/2020   CALCIUM 9.2 11/17/2020   ANIONGAP 11 11/17/2020   No results found for: CHOL No results found for: HDL No results found for: LDLCALC No results found for: TRIG No results found for: CHOLHDL Lab Results  Component Value Date   HGBA1C 4.8 09/14/2020      Assessment & Plan:   Problem List Items Addressed This Visit      Other   Obesity   Anxiety - Primary Persistent  Refilled medication as desired Ms Kackley is to check in with her current residence to see if she can restart Vistaril for assistnace in managing her anxiety   Relevant Medications   escitalopram (LEXAPRO) 5 MG tablet   escitalopram (LEXAPRO) 10 MG tablet    Other Visit Diagnoses    Major depressive disorder, recurrent severe without psychotic features (HCC)    Stable  Coping well with some of the recent changes that she has endured both positive and eventful    Relevant Medications   escitalopram (LEXAPRO) 5 MG tablet   escitalopram (LEXAPRO) 10 MG tablet      Meds ordered this encounter  Medications  . escitalopram (LEXAPRO) 5 MG tablet    Sig: Take 1 tablet (5 mg total) by mouth daily.    Dispense:  30 tablet    Refill:  11    Order Specific Question:   Supervising Provider    Answer:   Quentin Angst [id number]  . escitalopram (LEXAPRO) 10 MG tablet    Sig: Take 1 tablet (10 mg total) by mouth daily.    Dispense:  30 tablet    Refill:  11    Order Specific Question:   Supervising Provider    Answer:   Quentin Angst [[id number]]    Follow-up: Return in about 3 months (around 06/06/2021) for Follow-up for anxiety 99213.    Barbette Merino, NP

## 2021-04-24 ENCOUNTER — Telehealth (INDEPENDENT_AMBULATORY_CARE_PROVIDER_SITE_OTHER): Payer: Medicare Other | Admitting: Nurse Practitioner

## 2021-04-24 ENCOUNTER — Other Ambulatory Visit: Payer: Self-pay

## 2021-04-24 DIAGNOSIS — F419 Anxiety disorder, unspecified: Secondary | ICD-10-CM

## 2021-04-24 DIAGNOSIS — F332 Major depressive disorder, recurrent severe without psychotic features: Secondary | ICD-10-CM

## 2021-04-24 DIAGNOSIS — Z6841 Body Mass Index (BMI) 40.0 and over, adult: Secondary | ICD-10-CM

## 2021-04-24 NOTE — Progress Notes (Signed)
   [facility] Patient [facility] [name] [place], [place]  [address] Phone:  [phone]   Fax:  [phone] Virtual Visit via Telephone Note  I connected with Rebekah Davis on 04/27/21 at  3:40 PM EDT by telephone and verified that I am speaking with the correct person using two identifiers.   I discussed the limitations, risks, security and privacy concerns of performing an evaluation and management service by telephone and the availability of in person appointments. I also discussed with the patient that there may be a patient responsible charge related to this service. The patient expressed understanding and agreed to proceed.  Patient home Provider Office  History of Present Illness:  Rebekah Davis  has a past medical history of Anxiety, Arthritis, Depression, History of kidney stones, Migraine, PCOS (polycystic ovarian syndrome), and PTSD (post-traumatic stress disorder).   She is working second shift. She is having a sneezing and cough. She reports that the symptoms are improving.  She did have to be out of work due to symptoms and when she returned she was let go.  She remains positive and noted her is a greater opportunity related her.  She reports she has faced some obstacles but has been able to overcome them.  ROS   Observations/Objective: No exam; telephone visit.  Patient able to speak in full and clear sentences without difficulty  Assessment and Plan: Assessment  Primary Diagnosis & Pertinent Problem List: The primary encounter diagnosis was Major depressive disorder, recurrent severe without psychotic features (HCC). Diagnoses of Anxiety and Class 3 severe obesity due to excess calories without serious comorbidity with body mass index (BMI) of 45.0 to 49.9 in adult [facility]) were also pertinent to this visit.  Visit Diagnosis: 1. Major depressive disorder, recurrent severe without psychotic features (HCC)  Stable establishing care with psychiatry and  therapist Continue with current regimen.  No changes warranted. Good patient compliance.  2. Anxiety  Stable however she has encountered some increased periods of anxiety.  She continues to use positive [place] music and journaling to help with her self-care   3. Class 3 severe obesity due to excess calories without serious comorbidity with body mass index (BMI) of 45.0 to 49.9 in adult [facility])  Improving continue to encourage patient to move forward in weight loss goals slowly to ensure sustainability     Follow Up Instructions:    I discussed the assessment and treatment plan with the patient. The patient was provided an opportunity to ask questions and all were answered. The patient agreed with the plan and demonstrated an understanding of the instructions.   The patient was advised to call back or seek an in-person evaluation if the symptoms worsen or if the condition fails to improve as anticipated.  I provided 5 minutes of telephone- visit time during this encounter.   Barbette Merino, NP

## 2021-06-05 ENCOUNTER — Encounter: Payer: Self-pay | Admitting: Nurse Practitioner

## 2021-06-05 ENCOUNTER — Other Ambulatory Visit: Payer: Self-pay

## 2021-06-05 ENCOUNTER — Ambulatory Visit (INDEPENDENT_AMBULATORY_CARE_PROVIDER_SITE_OTHER): Payer: Medicare Other | Admitting: Nurse Practitioner

## 2021-06-05 VITALS — BP 136/88 | HR 87 | Temp 97.7°F | Ht 65.0 in | Wt 308.0 lb

## 2021-06-05 DIAGNOSIS — R000 Tachycardia, unspecified: Secondary | ICD-10-CM | POA: Diagnosis not present

## 2021-06-05 DIAGNOSIS — F419 Anxiety disorder, unspecified: Secondary | ICD-10-CM

## 2021-06-05 DIAGNOSIS — Z9889 Other specified postprocedural states: Secondary | ICD-10-CM

## 2021-06-05 DIAGNOSIS — F332 Major depressive disorder, recurrent severe without psychotic features: Secondary | ICD-10-CM | POA: Diagnosis not present

## 2021-06-05 NOTE — Patient Instructions (Signed)

## 2021-06-05 NOTE — Progress Notes (Signed)
[facility] Patient [facility] [address] [place], [place]  [address] Phone:  [phone]   Fax:  [phone]   Established Patient Office Visit  Subjective:  Patient ID: Rebekah Davis, female    DOB: 04-30-1986  Age: 35 y.o. MRN: [id number]  CC:  Chief Complaint  Patient presents with   Follow-up    3 month follow up; left heel discomfort has surgery Dec 2021.    HPI Rebekah Davis presents for follow up. She  has a past medical history of Anxiety, Arthritis, Depression, History of kidney stones, Migraine, PCOS (polycystic ovarian syndrome), and PTSD (post-traumatic stress disorder).   She is in today for for her 51-month follow-up.  She is currently being treated for depression with Lexapro which is effective.  She has significant anxiety which is not being treated as effective.  With episodes of anxiety her heart rate increases significantly which also causes an elevation in her blood pressure as indicated on her intake vitals.  She she prays often, listensto music, performs deep breathing exercises and tries to change her thought process to help with the anxiety.  This is not always effective and this leaves her feeling somewhat discouraged. She is counseling. She just started counseling. She reports that her first session went well. She has been on Vistaril 25 mg. She reports that that this was effective for her anxiety however with her current residency she is not able to take this prescription.  She works at [employer] as a Conservation officer, nature.  She does have to stand for long periods of time.  This has caused increased swelling in her left ankle.  She is status post Achilles tendon repair and was evaluated there may be some support have concern.  She has placed a call with orthopedist for follow-up.  She reports some new stressors with her dad suffering several TIA and desires to speak with her.  She is unsure if establishing contact will impact her negatively due to past.  Past Medical  History:  Diagnosis Date   Anxiety    Arthritis    Depression    History of kidney stones    Migraine    PCOS (polycystic ovarian syndrome)    PTSD (post-traumatic stress disorder)     Past Surgical History:  Procedure Laterality Date   TENOLYSIS Left 10/04/2020   Procedure: LEFT ACHILLES TENDON DEBRIDEMENT AND RECONSTRUCTION, CALCANEAL EXOSTECTOMY, GASTROCNEMIUS RECESSION,  FLEXOR HALLUCIS LONGUS TRANSFER;  Surgeon: Terance Hart, MD;  Location: [facility] OR;  Service: Orthopedics;  Laterality: Left;  LENGTH OF SURGERY: 1.5 HOURS    Family History  Problem Relation Age of Onset   Hypertension Mother    Diabetes Mother    Kidney disease Mother    Depression Mother    Hypertension Father    Diabetes Father     Social History   Socioeconomic History   Marital status: Single    Spouse name: Not on file   Number of children: 0   Years of education: Not on file   Highest education level: Bachelor's degree (e.g., BA, AB, BS)  Occupational History   Occupation: unemployed  Tobacco Use   Smoking status: Former   Smokeless tobacco: Never  Building services engineer Use: Never used  Substance and Sexual Activity   Alcohol use: Not Currently   Drug use: Not Currently   Sexual activity: Not Currently  Other Topics Concern   Not on file  Social History Narrative   Not on file  Social Determinants of Health   Financial Resource Strain: Not on file  Food Insecurity: Not on file  Transportation Needs: Not on file  Physical Activity: Not on file  Stress: Not on file  Social Connections: Not on file  Intimate Partner Violence: Not on file    Outpatient Medications Prior to Visit  Medication Sig Dispense Refill   acetaminophen (TYLENOL) 500 MG tablet Take 1,000 mg by mouth every 8 (eight) hours as needed for moderate pain.     ARIPiprazole (ABILIFY) 20 MG tablet Take 1 tablet (20 mg total) by mouth daily. 30 tablet 11   escitalopram (LEXAPRO) 10 MG tablet Take 1 tablet (10 mg  total) by mouth daily. 30 tablet 11   escitalopram (LEXAPRO) 5 MG tablet Take 1 tablet (5 mg total) by mouth daily. 30 tablet 11   No facility-administered medications prior to visit.    Allergies  Allergen Reactions   Adhesive [Tape] Other (See Comments)    blister   Cherry Anaphylaxis and Rash   Other Hives and Rash   Sulfamethoxazole-Trimethoprim Hives   Levofloxacin     Other reaction(s): Muscle Pain, Other (See Comments)   Morphine     Other reaction(s): Other (See Comments) Feels like unable to breath or swallow    Gabapentin Rash   Metformin Nausea And Vomiting    Other reaction(s): Other (See Comments) diaphoresis    Sumatriptan Nausea And Vomiting    Other reaction(s): Other (See Comments) Decreased heart rate and respiratory rate Decreased heart rate and respiratory rate     ROS Review of Systems    Objective:    Physical Exam Constitutional:      Appearance: She is obese.  HENT:     Head: Normocephalic and atraumatic.     Nose: Nose normal.     Mouth/Throat:     Mouth: Mucous membranes are moist.  Cardiovascular:     Rate and Rhythm: Regular rhythm. Tachycardia present.     Pulses: Normal pulses.     Heart sounds: Normal heart sounds.  Pulmonary:     Effort: Pulmonary effort is normal.     Breath sounds: Normal breath sounds.  Abdominal:     Palpations: Abdomen is soft.  Musculoskeletal:        General: Swelling present. Normal range of motion.     Cervical back: Normal range of motion.     Comments: Left heel healed incisional scar   Skin:    General: Skin is warm and dry.     Capillary Refill: Capillary refill takes less than 2 seconds.  Neurological:     General: No focal deficit present.     Mental Status: She is alert and oriented to person, place, and time.  Psychiatric:        Mood and Affect: Mood normal.        Behavior: Behavior normal.        Thought Content: Thought content normal.        Judgment: Judgment normal.    BP  (!) 150/86   Pulse (!) 127   Temp 97.7 F (36.5 C)   Ht 5\' 5"  (1.651 m)   Wt (!) 308 lb 0.6 oz (139.7 kg)   LMP 06/01/2021   SpO2 100%   BMI 51.26 kg/m  Wt Readings from Last 3 Encounters:  06/05/21 (!) 308 lb 0.6 oz (139.7 kg)  03/06/21 (!) 311 lb (141.1 kg)  01/18/21 (!) 319 lb (144.7 kg)     Health Maintenance  Due  Topic Date Due   COVID-19 Vaccine (1) Never done   Pneumococcal Vaccine 51-53 Years old (1 - PCV) Never done   INFLUENZA VACCINE  05/15/2021    There are no preventive care reminders to display for this patient.  Lab Results  Component Value Date   TSH 1.380 03/11/2020   Lab Results  Component Value Date   WBC 8.6 11/17/2020   HGB 14.6 11/17/2020   HCT 41.8 11/17/2020   MCV 92.9 11/17/2020   PLT 241 11/17/2020   Lab Results  Component Value Date   NA 140 11/17/2020   K 3.3 (L) 11/17/2020   CO2 26 11/17/2020   GLUCOSE 96 11/17/2020   BUN 12 11/17/2020   CREATININE 1.07 (H) 11/17/2020   BILITOT 1.1 11/17/2020   ALKPHOS 61 11/17/2020   AST 33 11/17/2020   ALT 38 11/17/2020   PROT 6.5 11/17/2020   ALBUMIN 3.9 11/17/2020   CALCIUM 9.2 11/17/2020   ANIONGAP 11 11/17/2020   No results found for: CHOL No results found for: HDL No results found for: LDLCALC No results found for: TRIG No results found for: CHOLHDL Lab Results  Component Value Date   HGBA1C 4.8 09/14/2020      Assessment & Plan:   Problem List Items Addressed This Visit       Other   Anxiety Worsening  Discussed Medicines that relieve anxiety Discussed counseling Discussed exercise in particular may have an especially positive effect on depression. three to five exercise sessions per week, that last 45 to 60 minutes per session, for at least 10 weeks, and that involve aerobic exercise (such as walking, running, or cycling) or resistance training (upper and lower body weight lifting).    Other Visit Diagnoses     Tachycardia    - Worsening with anxiety Concern that  anxiety needs to be treated to avoid tachycardia     Major depressive disorder, recurrent severe without psychotic features (HCC)    Stable  Continue with Lexapro Continue with lifestyle modifications   Encourage exercise   Hx of Achilles tendon repair (L) Persistent swelling Encourage compression socks Call to podiatry for additional suggestions     No orders of the defined types were placed in this encounter.   Follow-up: Return in about 3 months (around 09/05/2021) for Follow-up for anxiety 99213.    Barbette Merino, NP

## 2021-06-20 ENCOUNTER — Other Ambulatory Visit: Payer: Self-pay | Admitting: Orthopaedic Surgery

## 2021-06-20 DIAGNOSIS — M25572 Pain in left ankle and joints of left foot: Secondary | ICD-10-CM

## 2021-06-25 ENCOUNTER — Other Ambulatory Visit: Payer: Self-pay

## 2021-06-25 ENCOUNTER — Ambulatory Visit
Admission: RE | Admit: 2021-06-25 | Discharge: 2021-06-25 | Disposition: A | Discharge status: Home / Self Care | Payer: Medicare Other | Source: Ambulatory Visit | Attending: Orthopaedic Surgery | Admitting: Orthopaedic Surgery

## 2021-06-25 DIAGNOSIS — M25572 Pain in left ankle and joints of left foot: Secondary | ICD-10-CM

## 2021-06-27 NOTE — Progress Notes (Signed)
Created in error

## 2021-06-30 ENCOUNTER — Ambulatory Visit (INDEPENDENT_AMBULATORY_CARE_PROVIDER_SITE_OTHER): Payer: Medicare Other

## 2021-06-30 DIAGNOSIS — Z0000 Encounter for general adult medical examination without abnormal findings: Secondary | ICD-10-CM | POA: Diagnosis not present

## 2021-06-30 NOTE — Progress Notes (Addendum)
Subjective:   Rebekah Davis is a 35 y.o. female who presents for an Initial Medicare Annual Wellness Visit.   I connected with  [name] on 06/30/21 by a audio enabled telemedicine application and verified that I am speaking with the correct person using two identifiers.   I discussed the limitations of evaluation and management by telemedicine. The patient expressed understanding and agreed to proceed.   Location of pt: home Location of Provider: office Persons participating on visit: [name] (pt) and [name], CMA  Review of Systems    Defer to PCP        Objective:    Today's Vitals   06/30/21 1311  PainSc: 4    There is no height or weight on file to calculate BMI.  Advanced Directives 06/30/2021 09/14/2020 07/07/2020 04/27/2020 02/09/2020  Does Patient Have a Medical Advance Directive? No No No No No  Would patient like information on creating a medical advance directive? No - Patient declined - No - Patient declined No - Patient declined No - Patient declined    Current Medications (verified) Outpatient Encounter Medications as of 06/30/2021  Medication Sig   acetaminophen (TYLENOL) 500 MG tablet Take 1,000 mg by mouth every 8 (eight) hours as needed for moderate pain.   ARIPiprazole (ABILIFY) 20 MG tablet Take 1 tablet (20 mg total) by mouth daily.   escitalopram (LEXAPRO) 10 MG tablet Take 1 tablet (10 mg total) by mouth daily.   escitalopram (LEXAPRO) 5 MG tablet Take 1 tablet (5 mg total) by mouth daily.   No facility-administered encounter medications on file as of 06/30/2021.    Allergies (verified) Adhesive [tape], Cherry, Other, Sulfamethoxazole-trimethoprim, Levofloxacin, Morphine, Gabapentin, Metformin, and Sumatriptan   History: Past Medical History:  Diagnosis Date   Anxiety    Arthritis    Depression    History of kidney stones    Migraine    PCOS (polycystic ovarian syndrome)    PTSD (post-traumatic stress disorder)    Past Surgical History:   Procedure Laterality Date   TENOLYSIS Left 10/04/2020   Procedure: LEFT ACHILLES TENDON DEBRIDEMENT AND RECONSTRUCTION, CALCANEAL EXOSTECTOMY, GASTROCNEMIUS RECESSION,  FLEXOR HALLUCIS LONGUS TRANSFER;  Surgeon: Terance Hart, MD;  Location: [facility] OR;  Service: Orthopedics;  Laterality: Left;  LENGTH OF SURGERY: 1.5 HOURS   Family History  Problem Relation Age of Onset   Hypertension Mother    Diabetes Mother    Kidney disease Mother    Depression Mother    Hypertension Father    Diabetes Father    Social History   Socioeconomic History   Marital status: Single    Spouse name: Not on file   Number of children: 0   Years of education: Not on file   Highest education level: Bachelor's degree (e.g., BA, AB, BS)  Occupational History   Occupation: unemployed  Tobacco Use   Smoking status: Former    Packs/day: 1.00    Years: 10.00    Pack years: 10.00    Types: Cigarettes    Quit date: 03/2019    Years since quitting: 2.2   Smokeless tobacco: Never  Vaping Use   Vaping Use: Never used  Substance and Sexual Activity   Alcohol use: Not Currently   Drug use: Never   Sexual activity: Not Currently  Other Topics Concern   Not on file  Social History Narrative   Not on file   Social Determinants of Health   Financial Resource Strain: Not on file  Food Insecurity:  No Food Insecurity   Worried About Programme researcher, broadcasting/film/video in the Last Year: Never true   Ran Out of Food in the Last Year: Never true  Transportation Needs: Not on file  Physical Activity: Not on file  Stress: Not on file  Social Connections: Not on file    Tobacco Counseling Counseling given: Not Answered   Clinical Intake:  Pre-visit preparation completed: Yes  Pain : 0-10 Pain Score: 4  Pain Type: Chronic pain Pain Location: Foot Pain Orientation: Left Pain Descriptors / Indicators: Sharp, Dull Pain Onset: More than a month ago Pain Frequency: Constant Pain Relieving Factors: Tylenol and ice,  boot Effect of Pain on Daily Activities: can't exercise  Pain Relieving Factors: Tylenol and ice, boot  Diabetes: No  How often do you need to have someone help you when you read instructions, pamphlets, or other written materials from your doctor or pharmacy?: 1 - Never What is the last grade level you completed in school?: bachelors degree  Diabetic?No  Interpreter Needed?: No      Activities of Daily Living In your present state of health, do you have any difficulty performing the following activities: 06/30/2021 10/04/2020  Hearing? N -  Vision? N -  Difficulty concentrating or making decisions? Y -  Walking or climbing stairs? N -  Dressing or bathing? N -  Doing errands, shopping? N Y  Some recent data might be hidden    Patient Care Team: Barbette Merino, NP as PCP - General (Adult Health Nurse Practitioner)  Indicate any recent Medical Services you may have received from other than [facility] providers in the past year (date may be approximate).     Assessment:   This is a routine wellness examination for Rebekah Davis.  Hearing/Vision screen No results found.  Dietary issues and exercise activities discussed:     Goals Addressed   None    Depression Screen PHQ 2/9 Scores 06/30/2021 06/05/2021 03/06/2021 09/14/2020 03/11/2020  PHQ - 2 Score 6 2 0 4 2  PHQ- 9 Score 20 7 3 11 9   Exception Documentation - - - - Patient refusal  Some encounter information is confidential and restricted. Go to Review Flowsheets activity to see all data.    Fall Risk Fall Risk  06/30/2021 06/30/2021 06/05/2021 04/24/2021 03/06/2021  Falls in the past year? - 1 0 0 0  Number falls in past yr: - 0 0 0 0  Injury with Fall? - 1 0 0 0  Risk for fall due to : Other (Comment) History of fall(s);Impaired balance/gait;Orthopedic patient - - -  Follow up - Falls evaluation completed - - -    FALL RISK PREVENTION PERTAINING TO THE HOME:  Any stairs in or around the home? Yes  If so, are there any  without handrails? Yes  Home free of loose throw rugs in walkways, pet beds, electrical cords, etc? No  Adequate lighting in your home to reduce risk of falls? Yes   ASSISTIVE DEVICES UTILIZED TO PREVENT FALLS:  Life alert? No  Use of a cane, walker or w/c? No  Grab bars in the bathroom? No  Shower chair or bench in shower? No  Elevated toilet seat or a handicapped toilet? No   TIMED UP AND GO:  Was the test performed? No .  Length of time to ambulate 10 feet: N/A   Cognitive Function:     6CIT Screen 06/30/2021  What Year? 0 points  What month? 0 points  What time? 0  points  Count back from 20 0 points  Months in reverse 0 points  Repeat phrase 0 points  Total Score 0    Immunizations Immunization History  Administered Date(s) Administered   Influenza-Unspecified 07/25/2013   Tdap 04/22/2013, 01/21/2020    TDAP status: Up to date  Flu Vaccine status: Due, Education has been provided regarding the importance of this vaccine. Advised may receive this vaccine at local pharmacy or Health Dept. Aware to provide a copy of the vaccination record if obtained from local pharmacy or Health Dept. Verbalized acceptance and understanding.   Covid-19 vaccine status: Declined, Education has been provided regarding the importance of this vaccine but patient still declined. Advised may receive this vaccine at local pharmacy or Health Dept.or vaccine clinic. Aware to provide a copy of the vaccination record if obtained from local pharmacy or Health Dept. Verbalized acceptance and understanding.  Qualifies for Shingles Vaccine? No   Zostavax completed No    Screening Tests Health Maintenance  Topic Date Due   COVID-19 Vaccine (1) Never done   INFLUENZA VACCINE  05/15/2021   Hepatitis C Screening  09/14/2021 (Originally 01/05/2004)   PAP SMEAR-Modifier  10/18/2022   TETANUS/TDAP  01/20/2030   HIV Screening  Completed   HPV VACCINES  Aged Out    Health Maintenance  Health  Maintenance Due  Topic Date Due   COVID-19 Vaccine (1) Never done   INFLUENZA VACCINE  05/15/2021    Colorectal cancer screening: N/A  Mammogram status: N/A  Bone Density status: N/A  Lung Cancer Screening: (Low Dose CT Chest recommended if Age 29-80 years, 30 pack-year currently smoking OR have quit w/in 15years.) does qualify.   Lung Cancer Screening Referral: N/A  Additional Screening:  Hepatitis C Screening: does not qualify;   Vision Screening: Recommended annual ophthalmology exams for early detection of glaucoma and other disorders of the eye. Is the patient up to date with their annual eye exam?  Yes  Who is the provider or what is the name of the office in which the patient attends annual eye exams? Dr Isabel Caprice, [address], [place] If pt is not established with a provider, would they like to be referred to a provider to establish care? No .  Will establish care here.  Dental Screening: Recommended annual dental exams for proper oral hygiene  Community Resource Referral / Chronic Care Management: CRR required this visit?  Yes   CCM required this visit?  No      Plan:     I have personally reviewed and noted the following in the patient's chart:   Medical and social history Use of alcohol, tobacco or illicit drugs  Current medications and supplements including opioid prescriptions. Patient is not currently taking opioid prescriptions. Functional ability and status Nutritional status Physical activity Advanced directives List of other physicians Hospitalizations, surgeries, and ER visits in previous 12 months Vitals Screenings to include cognitive, depression, and falls Referrals and appointments  In addition, I have reviewed and discussed with patient certain preventive protocols, quality metrics, and best practice recommendations. A written personalized care plan for preventive services as well as general preventive health recommendations were provided to patient.      Kirke Corin   06/30/2021   Nurse Notes: No face to face. 30 minute visit    Ms. Drema Pry , Thank you for taking time to come for your Medicare Wellness Visit. I appreciate your ongoing commitment to your health goals. Please review the following plan we discussed and let me  know if I can assist you in the future.   These are the goals we discussed:  Goals      Weight (lb) < 200 lb (90.7 kg)        This is a list of the screening recommended for you and due dates:  Health Maintenance  Topic Date Due   COVID-19 Vaccine (1) Never done   Flu Shot  05/15/2021   Hepatitis C Screening: USPSTF Recommendation to screen - Ages 18-79 yo.  09/14/2021*   Pap Smear  10/18/2022   Tetanus Vaccine  01/20/2030   HIV Screening  Completed   HPV Vaccine  Aged Out  *Topic was postponed. The date shown is not the original due date.

## 2021-06-30 NOTE — Patient Instructions (Addendum)
Health Maintenance, Female Adopting a healthy lifestyle and getting preventive care are important in promoting health and wellness. Ask your health care provider about: The right schedule for you to have regular tests and exams. Things you can do on your own to prevent diseases and keep yourself healthy. What should I know about diet, weight, and exercise? Eat a healthy diet  Eat a diet that includes plenty of vegetables, fruits, low-fat dairy products, and lean protein. Do not eat a lot of foods that are high in solid fats, added sugars, or sodium. Maintain a healthy weight Body mass index (BMI) is used to identify weight problems. It estimates body fat based on height and weight. Your health care provider can help determine your BMI and help you achieve or maintain a healthy weight. Get regular exercise Get regular exercise. This is one of the most important things you can do for your health. Most adults should: Exercise for at least 150 minutes each week. The exercise should increase your heart rate and make you sweat (moderate-intensity exercise). Do strengthening exercises at least twice a week. This is in addition to the moderate-intensity exercise. Spend less time sitting. Even light physical activity can be beneficial. Watch cholesterol and blood lipids Have your blood tested for lipids and cholesterol at 35 years of age, then have this test every 5 years. Have your cholesterol levels checked more often if: Your lipid or cholesterol levels are high. You are older than 35 years of age. You are at high risk for heart disease. What should I know about cancer screening? Depending on your health history and family history, you may need to have cancer screening at various ages. This may include screening for: Breast cancer. Cervical cancer. Colorectal cancer. Skin cancer. Lung cancer. What should I know about heart disease, diabetes, and high blood pressure? Blood pressure and heart  disease High blood pressure causes heart disease and increases the risk of stroke. This is more likely to develop in people who have high blood pressure readings, are of African descent, or are overweight. Have your blood pressure checked: Every 3-5 years if you are 18-39 years of age. Every year if you are 40 years old or older. Diabetes Have regular diabetes screenings. This checks your fasting blood sugar level. Have the screening done: Once every three years after age 40 if you are at a normal weight and have a low risk for diabetes. More often and at a younger age if you are overweight or have a high risk for diabetes. What should I know about preventing infection? Hepatitis B If you have a higher risk for hepatitis B, you should be screened for this virus. Talk with your health care provider to find out if you are at risk for hepatitis B infection. Hepatitis C Testing is recommended for: Everyone born from 1945 through 1965. Anyone with known risk factors for hepatitis C. Sexually transmitted infections (STIs) Get screened for STIs, including gonorrhea and chlamydia, if: You are sexually active and are younger than 35 years of age. You are older than 35 years of age and your health care provider tells you that you are at risk for this type of infection. Your sexual activity has changed since you were last screened, and you are at increased risk for chlamydia or gonorrhea. Ask your health care provider if you are at risk. Ask your health care provider about whether you are at high risk for HIV. Your health care provider may recommend a prescription medicine   to help prevent HIV infection. If you choose to take medicine to prevent HIV, you should first get tested for HIV. You should then be tested every 3 months for as long as you are taking the medicine. Pregnancy If you are about to stop having your period (premenopausal) and you may become pregnant, seek counseling before you get  pregnant. Take 400 to 800 micrograms (mcg) of folic acid every day if you become pregnant. Ask for birth control (contraception) if you want to prevent pregnancy. Osteoporosis and menopause Osteoporosis is a disease in which the bones lose minerals and strength with aging. This can result in bone fractures. If you are 65 years old or older, or if you are at risk for osteoporosis and fractures, ask your health care provider if you should: Be screened for bone loss. Take a calcium or vitamin D supplement to lower your risk of fractures. Be given hormone replacement therapy (HRT) to treat symptoms of menopause. Follow these instructions at home: Lifestyle Do not use any products that contain nicotine or tobacco, such as cigarettes, e-cigarettes, and chewing tobacco. If you need help quitting, ask your health care provider. Do not use street drugs. Do not share needles. Ask your health care provider for help if you need support or information about quitting drugs. Alcohol use Do not drink alcohol if: Your health care provider tells you not to drink. You are pregnant, may be pregnant, or are planning to become pregnant. If you drink alcohol: Limit how much you use to 0-1 drink a day. Limit intake if you are breastfeeding. Be aware of how much alcohol is in your drink. In the [place] one drink equals one 12 oz bottle of beer (355 mL), one 5 oz glass of wine (148 mL), or one 1 oz glass of hard liquor (44 mL). General instructions Schedule regular health, dental, and eye exams. Stay current with your vaccines. Tell your health care provider if: You often feel depressed. You have ever been abused or do not feel safe at home. Summary Adopting a healthy lifestyle and getting preventive care are important in promoting health and wellness. Follow your health care provider's instructions about healthy diet, exercising, and getting tested or screened for diseases. Follow your health care provider's  instructions on monitoring your cholesterol and blood pressure. This information is not intended to replace advice given to you by your health care provider. Make sure you discuss any questions you have with your health care provider. Document Revised: 12/09/2020 Document Reviewed: 09/24/2018 Elsevier Patient Education  2022 [employer]  Health Maintenance, Female Adopting a healthy lifestyle and getting preventive care are important in promoting health and wellness. Ask your health care provider about: The right schedule for you to have regular tests and exams. Things you can do on your own to prevent diseases and keep yourself healthy. What should I know about diet, weight, and exercise? Eat a healthy diet  Eat a diet that includes plenty of vegetables, fruits, low-fat dairy products, and lean protein. Do not eat a lot of foods that are high in solid fats, added sugars, or sodium. Maintain a healthy weight Body mass index (BMI) is used to identify weight problems. It estimates body fat based on height and weight. Your health care provider can help determine your BMI and help you achieve or maintain a healthy weight. Get regular exercise Get regular exercise. This is one of the most important things you can do for your health. Most adults should: Exercise for   at least 150 minutes each week. The exercise should increase your heart rate and make you sweat (moderate-intensity exercise). Do strengthening exercises at least twice a week. This is in addition to the moderate-intensity exercise. Spend less time sitting. Even light physical activity can be beneficial. Watch cholesterol and blood lipids Have your blood tested for lipids and cholesterol at 35 years of age, then have this test every 5 years. Have your cholesterol levels checked more often if: Your lipid or cholesterol levels are high. You are older than 35 years of age. You are at high risk for heart disease. What should I know  about cancer screening? Depending on your health history and family history, you may need to have cancer screening at various ages. This may include screening for: Breast cancer. Cervical cancer. Colorectal cancer. Skin cancer. Lung cancer. What should I know about heart disease, diabetes, and high blood pressure? Blood pressure and heart disease High blood pressure causes heart disease and increases the risk of stroke. This is more likely to develop in people who have high blood pressure readings, are of African descent, or are overweight. Have your blood pressure checked: Every 3-5 years if you are 18-39 years of age. Every year if you are 40 years old or older. Diabetes Have regular diabetes screenings. This checks your fasting blood sugar level. Have the screening done: Once every three years after age 40 if you are at a normal weight and have a low risk for diabetes. More often and at a younger age if you are overweight or have a high risk for diabetes. What should I know about preventing infection? Hepatitis B If you have a higher risk for hepatitis B, you should be screened for this virus. Talk with your health care provider to find out if you are at risk for hepatitis B infection. Hepatitis C Testing is recommended for: Everyone born from 1945 through 1965. Anyone with known risk factors for hepatitis C. Sexually transmitted infections (STIs) Get screened for STIs, including gonorrhea and chlamydia, if: You are sexually active and are younger than 35 years of age. You are older than 35 years of age and your health care provider tells you that you are at risk for this type of infection. Your sexual activity has changed since you were last screened, and you are at increased risk for chlamydia or gonorrhea. Ask your health care provider if you are at risk. Ask your health care provider about whether you are at high risk for HIV. Your health care provider may recommend a prescription  medicine to help prevent HIV infection. If you choose to take medicine to prevent HIV, you should first get tested for HIV. You should then be tested every 3 months for as long as you are taking the medicine. Pregnancy If you are about to stop having your period (premenopausal) and you may become pregnant, seek counseling before you get pregnant. Take 400 to 800 micrograms (mcg) of folic acid every day if you become pregnant. Ask for birth control (contraception) if you want to prevent pregnancy. Osteoporosis and menopause Osteoporosis is a disease in which the bones lose minerals and strength with aging. This can result in bone fractures. If you are 65 years old or older, or if you are at risk for osteoporosis and fractures, ask your health care provider if you should: Be screened for bone loss. Take a calcium or vitamin D supplement to lower your risk of fractures. Be given hormone replacement therapy (HRT) to   treat symptoms of menopause. Follow these instructions at home: Lifestyle Do not use any products that contain nicotine or tobacco, such as cigarettes, e-cigarettes, and chewing tobacco. If you need help quitting, ask your health care provider. Do not use street drugs. Do not share needles. Ask your health care provider for help if you need support or information about quitting drugs. Alcohol use Do not drink alcohol if: Your health care provider tells you not to drink. You are pregnant, may be pregnant, or are planning to become pregnant. If you drink alcohol: Limit how much you use to 0-1 drink a day. Limit intake if you are breastfeeding. Be aware of how much alcohol is in your drink. In the [place] one drink equals one 12 oz bottle of beer (355 mL), one 5 oz glass of wine (148 mL), or one 1 oz glass of hard liquor (44 mL). General instructions Schedule regular health, dental, and eye exams. Stay current with your vaccines. Tell your health care provider if: You often feel  depressed. You have ever been abused or do not feel safe at home. Summary Adopting a healthy lifestyle and getting preventive care are important in promoting health and wellness. Follow your health care provider's instructions about healthy diet, exercising, and getting tested or screened for diseases. Follow your health care provider's instructions on monitoring your cholesterol and blood pressure. This information is not intended to replace advice given to you by your health care provider. Make sure you discuss any questions you have with your health care provider. Document Revised: 12/09/2020 Document Reviewed: 09/24/2018 Elsevier Patient Education  2022 [employer]  

## 2021-07-06 ENCOUNTER — Encounter: Payer: Self-pay | Admitting: Physical Therapy

## 2021-07-06 ENCOUNTER — Other Ambulatory Visit: Payer: Self-pay

## 2021-07-06 ENCOUNTER — Ambulatory Visit: Payer: Medicare Other | Attending: Orthopaedic Surgery | Admitting: Physical Therapy

## 2021-07-06 DIAGNOSIS — M6281 Muscle weakness (generalized): Secondary | ICD-10-CM | POA: Diagnosis present

## 2021-07-06 DIAGNOSIS — M25672 Stiffness of left ankle, not elsewhere classified: Secondary | ICD-10-CM | POA: Insufficient documentation

## 2021-07-06 DIAGNOSIS — R600 Localized edema: Secondary | ICD-10-CM | POA: Diagnosis present

## 2021-07-06 DIAGNOSIS — R2689 Other abnormalities of gait and mobility: Secondary | ICD-10-CM | POA: Diagnosis present

## 2021-07-06 DIAGNOSIS — M25572 Pain in left ankle and joints of left foot: Secondary | ICD-10-CM | POA: Diagnosis present

## 2021-07-06 NOTE — Patient Instructions (Addendum)
Access Code: [email or web address] URL: [email or web address] Date: 07/06/2021 Prepared by: Rosana Hoes  Exercises Long Sitting Calf Stretch with Strap - 2-3 x daily - 7 x weekly - 3 reps - 30 hold Long Sitting Ankle Plantar Flexion with Resistance - 1-2 x daily - 7 x weekly - 2 sets - 10 reps Long Sitting Ankle Eversion with Resistance - 1-2 x daily - 7 x weekly - 2 sets - 10 reps Long Sitting Ankle Inversion with Resistance - 1-2 x daily - 7 x weekly - 2 sets - 10 reps Long Sitting Ankle Dorsiflexion with Anchored Resistance - 1-2 x daily - 7 x weekly - 2 sets - 10 reps Ankle Inversion with Resistance - 1 x daily - 7 x weekly - 3 sets - 10 reps Seated Plantar Fascia Mobilization with Small Ball - 1-2 x daily - 7 x weekly    Aquatic Therapy at [facility]-  What to Expect!  Where:   [facility] [address] [place], [place] [address] Rehab phone [phone]  NOTE:  You will receive an automated phone message reminding you of your appt and it will say the appointment is at the [facility] clinic.          How to Prepare: Please make sure you drink 8 ounces of water about one hour prior to your pool session A caregiver may attend if needed with the patient to help assist as needed. A caregiver can sit in the pool room on chair. Please arrive IN YOUR SUIT and 15 minutes prior to your appointment - this helps to avoid delays in starting your session. Please make sure to attend to any toileting needs prior to entering the pool [place] rooms for changing are provided.   There is direct access to the pool deck form the locker room.  You can lock your belongings in a locker with lock provided. Once on the pool deck your therapist will ask if you have signed the Patient  Consent and Assignment of Benefits form before beginning treatment Your therapist may take your blood pressure prior to, during and after your session  if indicated We usually try and create a home exercise program based on activities we do in the pool.  Please be thinking about who might be able to assist you in the pool should you need to participate in an aquatic home exercise program at the time of discharge if you need assistance.  Some patients do not want to or do not have the ability to participate in an aquatic home program - this is not a barrier in any way to you participating in aquatic therapy as part of your current therapy plan! After Discharge from PT, you can continue using home program at  the [facility], there is a drop-in fee for $5 ($45 a month)or for 60 years  or older $4.00 ($40 a month for seniors ) or any local [employer] pool.  Memberships for purchase are available for gym/pool at [facility]  IT IS VERY IMPORTANT THAT YOUR LAST VISIT BE IN THE CLINIC AT [facility] [address]RE[address] AFTER YOUR LAST AQUATIC VISIT.  PLEASE MAKE SURE THAT YOU HAVE A [address]  APPOINTMENT SCHEDULED.   About the pool: Pool is located approximately 500 FT from the entrance of the building.  Please bring a support person if you need assistance traveling this      distance.   Your therapist will assist you in entering the water; there  are two ways to           enter: stairs with railings, and a mechanical lift. Your therapist will determine the most appropriate way for you.  Water temperature is usually between 88-90 degrees  There may be up to 2 other swimmers in the pool at the same time  The pool deck is tile, please wear shoes with good traction if you prefer not to be barefoot.    Contact Info:  For appointment scheduling and cancellations:         Please call the [facility]  [phone]              Aquatic Therapy  Outpatient Rehabilitation @ [facility]       All sessions are 45 minutes

## 2021-07-07 ENCOUNTER — Encounter: Payer: Self-pay | Admitting: Physical Therapy

## 2021-07-07 NOTE — Therapy (Signed)
[facility] [address] [place], [place], [address] Phone: [phone]   Fax:  [phone]  Physical Therapy Evaluation  Patient Details  Name: Rebekah Davis MRN: [id number] Date of Birth: 01-23-1986 Referring Provider (PT): Terance Hart, MD   Encounter Date: 07/06/2021   PT End of Session - 07/06/21 1628     Visit Number 1    Number of Visits 8    Date for PT Re-Evaluation 08/31/21    Authorization Type MCR / MCD    Authorization Time Period FOTO by 6th, KX by 15th    Progress Note Due on Visit 10    PT Start Time 1615    PT Stop Time 1700    PT Time Calculation (min) 45 min    Equipment Utilized During Treatment Other (comment)   CAM boot on left   Activity Tolerance Patient tolerated treatment well    Behavior During Therapy [facility] for tasks assessed/performed             Past Medical History:  Diagnosis Date   Anxiety    Arthritis    Depression    History of kidney stones    Migraine    PCOS (polycystic ovarian syndrome)    PTSD (post-traumatic stress disorder)     Past Surgical History:  Procedure Laterality Date   TENOLYSIS Left 10/04/2020   Procedure: LEFT ACHILLES TENDON DEBRIDEMENT AND RECONSTRUCTION, CALCANEAL EXOSTECTOMY, GASTROCNEMIUS RECESSION,  FLEXOR HALLUCIS LONGUS TRANSFER;  Surgeon: Terance Hart, MD;  Location: [facility] OR;  Service: Orthopedics;  Laterality: Left;  LENGTH OF SURGERY: 1.5 HOURS    There were no vitals filed for this visit.    Subjective Assessment - 07/06/21 1619     Subjective Patient reports she had achilles surgery in December 2021, she had therapy prior to the surgery but did not resolve the pain in her achilles. She was doing well following surgery, but then she started standing 8 hours at her job and the ankle flared up and is now painful. Pain is mainly located around the heel and bottom of foot. She has been elevating and icing to help with the pain and  swelling, and taking medication when it hurts bad. Pain occurs with all weight bearing tasks. She has been in a boot for 2 weeks, and was told to wear the boot for 3-4 weeks.    Pertinent History Achilles surgery 09/2020    Limitations Lifting;Standing;Walking    How long can you stand comfortably? "none"    How long can you walk comfortably? "10 minutes"    Patient Stated Goals Get relief of pain to improve standing and walking    Currently in Pain? Yes    Pain Score 6     Pain Location Heel    Pain Orientation Left    Pain Descriptors / Indicators Sharp;Aching    Pain Type Chronic pain    Pain Radiating Towards pain shoots around heel into the sole of the foot    Pain Onset More than a month ago    Pain Frequency Constant    Aggravating Factors  Standing, walking    Pain Relieving Factors Ice and elevation, rest, medication    Effect of Pain on Daily Activities Limited with standing and walking tasks, limited at work                [facility] PT Assessment - 07/07/21 0001       Assessment   Medical Diagnosis L achilles  tendonitis and plantar fasciitis    Referring Provider (PT) Terance Hart, MD    Onset Date/Surgical Date 10/04/20    Prior Therapy Yes      Precautions   Precautions None      Restrictions   Weight Bearing Restrictions No      Balance Screen   Has the patient fallen in the past 6 months No    Has the patient had a decrease in activity level because of a fear of falling?  No    Is the patient reluctant to leave their home because of a fear of falling?  No      Prior Function   Level of Independence Independent    Vocation Full time employment    Vocation Requirements Standing 8 hours    Leisure None reported      Cognition   Overall Cognitive Status Within Functional Limits for tasks assessed      Observation/Other Assessments   Observations Patient appears in no apparent distress, arrived wearing CAM boot on left    Focus on Therapeutic  Outcomes (FOTO)  36% functional status      Observation/Other Assessments-Edema    Edema Figure 8      Figure 8 Edema   Figure 8 - Left  Not formally assessed but patient with observable swelling compared to right ankle      Sensation   Light Touch Appears Intact    Additional Comments patient notes mild numbness plantar aspect of left foot      Coordination   Gross Motor Movements are Fluid and Coordinated Yes      Functional Tests   Functional tests Single leg stance      Single Leg Stance   Comments Not assessed due to patient being in CAM boot and increased pain      ROM / Strength   AROM / PROM / Strength AROM;Strength      AROM   AROM Assessment Site Ankle    Right/Left Ankle Right;Left    Right Ankle Dorsiflexion 4    Right Ankle Plantar Flexion 60    Right Ankle Inversion 45    Right Ankle Eversion 15    Left Ankle Dorsiflexion 0    Left Ankle Plantar Flexion 40    Left Ankle Inversion 35    Left Ankle Eversion 10      Strength   Strength Assessment Site Ankle    Right/Left Ankle Right;Left    Right Ankle Dorsiflexion 5/5    Right Ankle Plantar Flexion 5/5    Right Ankle Inversion 5/5    Right Ankle Eversion 5/5    Left Ankle Dorsiflexion 4+/5    Left Ankle Plantar Flexion 4-/5    Left Ankle Inversion 4/5    Left Ankle Eversion 4/5      Palpation   Palpation comment TTP generally around posterior/medial calcaneal region      Transfers   Transfers Independent with all Transfers      Ambulation/Gait   Ambulation/Gait Yes    Ambulation/Gait Assistance 6: Modified independent (Device/Increase time)    Gait Comments Patient in CAM boot on left, antalgic on left                        Objective measurements completed on examination: See above findings.       [facility] Adult PT Treatment/Exercise - 07/07/21 0001       Exercises   Exercises Ankle  Ankle Exercises: Stretches   Gastroc Stretch 3 reps;30 seconds    Gastroc Stretch  Limitations longsitting with strap    Other Stretch Seated SMFR using ball to plantar aspect of foot      Ankle Exercises: Supine   T-Band Longsitting 4-way with yellow x 10 each                     PT Education - 07/06/21 1627     Education Details Exam findings, POC, HEP, aquatic therapy    Person(s) Educated Patient    Methods Explanation;Demonstration;Tactile cues;Verbal cues;Handout    Comprehension Verbalized understanding;Returned demonstration;Verbal cues required;Tactile cues required;Need further instruction              PT Short Term Goals - 07/07/21 0820       PT SHORT TERM GOAL #1   Title Pt will be independent with her initial HEP to increase ankle flexibility and decrease pain.    Time 4    Period Weeks    Status New    Target Date 08/03/21      PT SHORT TERM GOAL #2   Title Patient will exhibit >/= 5 deg left ankle DF to improve gait with </= 3/10 pain    Time 4    Period Weeks    Status New    Target Date 08/03/21      PT SHORT TERM GOAL #3   Title PT will review FOTO with patient by 3rd visit to understand anticipated progress with PT    Time 3    Period Weeks    Status New    Target Date 07/27/21               PT Long Term Goals - 07/07/21 0821       PT LONG TERM GOAL #1   Title Patient will be I with final HEP to maintain progress from PT    Time 8    Period Weeks    Status New    Target Date 08/31/21      PT LONG TERM GOAL #2   Title Patient will report >/= 57% functional status on FOTO to indicate improved functional ability    Time 8    Period Weeks    Status New    Target Date 08/31/21      PT LONG TERM GOAL #3   Title Patient will demonstrate left ankle strength grossly 5/5 MMT in order to improve tolerance for standing and walking at work without increase in pain    Time 8    Period Weeks    Status New    Target Date 08/31/21      PT LONG TERM GOAL #4   Title Patient will be able to perform SL balance  >/= 10 sec on left to indicate improved ankle coordination to reduce pain with walking    Time 8    Period Weeks    Status New    Target Date 08/31/21      PT LONG TERM GOAL #5   Title Patient will report ability to stand >/= 1 hour without limitation or need to sit to improve working ability    Time 8    Period Weeks    Status New    Target Date 08/31/21                    Plan - 07/06/21 1629     Clinical Impression Statement Patient  presents to PT with report of chronic left ankle/foot pain with previous achilles surgery in 09/2020. The majority of her pain is located around posterior/medial calcaneus and plantar aspect of calcaneus. She does demonstrate increased swelling of the left achilles and heel region with generalized tenderness of the heel but no specific achilles tendon or plantar fascia insertional tenderness. She demonstrates limitations in left ankle active motion and strength. Balance and gait not assessed this visit due to patient being in a CAM boot and increase in pain with any weight bearing tasks. She was provided exercises to initiate calf stretching and ankle strengthening, and she would benefit from combination of land and aquatic PT to allow patient to exercise and strengthening her ankle with less pain in order to improve her standing and walking tolerance for work related tasks.    Personal Factors and Comorbidities Fitness;Past/Current Experience;Time since onset of injury/illness/exacerbation    Examination-Activity Limitations Locomotion Level;Squat;Stairs;Stand;Lift;Carry    Examination-Participation Restrictions Meal Prep;Cleaning;Occupation;Community Activity;Shop    Stability/Clinical Decision Making Stable/Uncomplicated    Clinical Decision Making Low    Rehab Potential Good    PT Frequency 1x / week    PT Duration 8 weeks    PT Treatment/Interventions ADLs/Self Care Home Management;Aquatic Therapy;Electrical  Stimulation;Cryotherapy;Iontophoresis 4mg /ml Dexamethasone;Moist Heat;Traction;Ultrasound;Neuromuscular re-education;Balance training;Therapeutic exercise;Therapeutic activities;Functional mobility training;Stair training;Gait training;Patient/family education;Manual techniques;Dry needling;Passive range of motion;Taping;Vasopneumatic Device;Spinal Manipulations;Joint Manipulations    PT Next Visit Plan Review HEP and progress PRN, aquatic therapy to progress walking/strength/balance with decreased pain, calf stretching, progress ankle strength, balance training    PT Home Exercise Plan [email or web address]    Consulted and Agree with Plan of Care Patient             Patient will benefit from skilled therapeutic intervention in order to improve the following deficits and impairments:  Abnormal gait, Decreased range of motion, Difficulty walking, Pain, Decreased activity tolerance, Impaired flexibility, Decreased balance, Decreased strength, Increased edema  Visit Diagnosis: Pain in left ankle and joints of left foot  Stiffness of left ankle, not elsewhere classified  Muscle weakness (generalized)  Other abnormalities of gait and mobility  Localized edema     Problem List Patient Active Problem List   Diagnosis Date Noted   Passive suicidal ideations 11/16/2020   Irregular bleeding 04/12/2020   Achilles tendinitis 03/11/2020   Ovarian cyst 02/19/2020   Deliberate self-cutting 01/23/2020   Non-suicidal self harm as coping mechanism 12/25/2019   Diarrhea 06/13/2018   Hypomagnesemia 06/13/2018   Transaminitis 06/13/2018   C. difficile diarrhea 06/12/2018   Oral thrush 06/12/2018   Suicidal ideation 04/12/2018   Chest pain 03/28/2018   Obesity 03/28/2018   OSA (obstructive sleep apnea) 03/28/2018   Acute cystitis 03/10/2018   GERD (gastroesophageal reflux disease) 03/10/2018   Depression with suicidal ideation 03/09/2018   PTSD (post-traumatic stress disorder) 03/09/2018   MDD (major  depressive disorder), recurrent severe, 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/2018   Polycystic disease, ovaries 07/05/2016   Abdominal pain, suprapubic 06/04/2016   Spondylisthesis 04/20/2015   Herniated lumbar intervertebral disc 02/28/2013   Sciatica of left side 02/28/2013   Borderline personality disorder (HCC) 12/29/2012   Anxiety 12/24/2012   COPD (chronic obstructive pulmonary disease) (HCC) 12/24/2012   Essential hypertension 12/24/2012   Low back pain 07/22/2012    Rosana Hoes, PT, DPT, LAT, ATC 07/07/21  8:34 AM Phone: [phone] Fax: [phone]   [facility] [address] [place], [place], [address] Phone: [phone]   Fax:  [phone]  Name: Rebekah  Davis MRN: [id number] Date of Birth: 08-13-1986

## 2021-07-19 ENCOUNTER — Ambulatory Visit (HOSPITAL_BASED_OUTPATIENT_CLINIC_OR_DEPARTMENT_OTHER): Payer: Medicare Other | Attending: Orthopaedic Surgery | Admitting: Physical Therapy

## 2021-07-19 ENCOUNTER — Other Ambulatory Visit: Payer: Self-pay

## 2021-07-19 DIAGNOSIS — M25672 Stiffness of left ankle, not elsewhere classified: Secondary | ICD-10-CM

## 2021-07-19 DIAGNOSIS — M25572 Pain in left ankle and joints of left foot: Secondary | ICD-10-CM | POA: Insufficient documentation

## 2021-07-19 DIAGNOSIS — M79672 Pain in left foot: Secondary | ICD-10-CM

## 2021-07-19 DIAGNOSIS — M6281 Muscle weakness (generalized): Secondary | ICD-10-CM

## 2021-07-19 DIAGNOSIS — R2689 Other abnormalities of gait and mobility: Secondary | ICD-10-CM | POA: Insufficient documentation

## 2021-07-19 DIAGNOSIS — R600 Localized edema: Secondary | ICD-10-CM

## 2021-07-19 DIAGNOSIS — R262 Difficulty in walking, not elsewhere classified: Secondary | ICD-10-CM

## 2021-07-19 NOTE — Therapy (Signed)
[facility] [address] [place], [place], [address] Phone: [phone]   Fax:  [phone]  Physical Therapy Treatment  Patient Details  Name: Rebekah Davis MRN: [id number] Date of Birth: 1986-05-14 Referring Provider (PT): Terance Hart, MD   Encounter Date: 07/19/2021   PT End of Session - 07/19/21 0854     Visit Number 2    Number of Visits 8    Date for PT Re-Evaluation 08/31/21    Authorization Type MCR / MCD    Authorization Time Period FOTO by 6th, KX by 15th    Progress Note Due on Visit 10    PT Start Time 0815    PT Stop Time 0855    PT Time Calculation (min) 40 min    Equipment Utilized During Treatment Other (comment)   CAM boot on left   Activity Tolerance Patient tolerated treatment well    Behavior During Therapy [facility] for tasks assessed/performed             Past Medical History:  Diagnosis Date   Anxiety    Arthritis    Depression    History of kidney stones    Migraine    PCOS (polycystic ovarian syndrome)    PTSD (post-traumatic stress disorder)     Past Surgical History:  Procedure Laterality Date   TENOLYSIS Left 10/04/2020   Procedure: LEFT ACHILLES TENDON DEBRIDEMENT AND RECONSTRUCTION, CALCANEAL EXOSTECTOMY, GASTROCNEMIUS RECESSION,  FLEXOR HALLUCIS LONGUS TRANSFER;  Surgeon: Terance Hart, MD;  Location: [facility] OR;  Service: Orthopedics;  Laterality: Left;  LENGTH OF SURGERY: 1.5 HOURS    There were no vitals filed for this visit.   Subjective Assessment - 07/19/21 1349     Subjective " I have been off of waork for 4 days and my foot feels the best it has in a long while.  Swelling is down.  Have to go back to work today.    Pain Score 3     Pain Location Heel    Pain Orientation Left    Pain Descriptors / Indicators Sharp;Aching    Pain Type Chronic pain    Pain Onset More than a month ago    Pain Frequency Constant                                         PT Education - 07/19/21 1352     Education Details Aquatics, properties of water, benefits    Person(s) Educated Patient    Methods Explanation    Comprehension Verbalized understanding              PT Short Term Goals - 07/07/21 0820       PT SHORT TERM GOAL #1   Title Pt will be independent with her initial HEP to increase ankle flexibility and decrease pain.    Time 4    Period Weeks    Status New    Target Date 08/03/21      PT SHORT TERM GOAL #2   Title Patient will exhibit >/= 5 deg left ankle DF to improve gait with </= 3/10 pain    Time 4    Period Weeks    Status New    Target Date 08/03/21      PT SHORT TERM GOAL #3   Title PT will review FOTO with patient by 3rd visit to understand anticipated  progress with PT    Time 3    Period Weeks    Status New    Target Date 07/27/21               PT Long Term Goals - 07/07/21 0821       PT LONG TERM GOAL #1   Title Patient will be I with final HEP to maintain progress from PT    Time 8    Period Weeks    Status New    Target Date 08/31/21      PT LONG TERM GOAL #2   Title Patient will report >/= 57% functional status on FOTO to indicate improved functional ability    Time 8    Period Weeks    Status New    Target Date 08/31/21      PT LONG TERM GOAL #3   Title Patient will demonstrate left ankle strength grossly 5/5 MMT in order to improve tolerance for standing and walking at work without increase in pain    Time 8    Period Weeks    Status New    Target Date 08/31/21      PT LONG TERM GOAL #4   Title Patient will be able to perform SL balance >/= 10 sec on left to indicate improved ankle coordination to reduce pain with walking    Time 8    Period Weeks    Status New    Target Date 08/31/21      PT LONG TERM GOAL #5   Title Patient will report ability to stand >/= 1 hour without limitation or need to sit to improve working ability     Time 8    Period Weeks    Status New    Target Date 08/31/21            Pt seen for aquatic therapy today.  Treatment took place in water 3.25-4.8 ft in depth at the [facility] pool. Temp of water was 94.  Pt entered/exited the pool via stairs step to pattern independently with bilat rail.  Pt entered water for aquatic therapy for first time and was introduced to principles and therapeutic effects of water as she ambulated and acclimated to pool. She was able to ambulate forward across pool , then backwards to engage posterior chain and then sidesteping in 3.6 ft   Standing Stretching: gastroc individually on bottom water step.  PT with demonstration for proper execution. Pt instructed to stretch in "hurt so good" range or just into pain.  Completed x 15 minutes Gently. "Runner stretch" for hamstrings -PF holding to wall 2 x 10 reps -DF holding to wall 2 x 10   Gait training: without UE support forward: vc and demo for normal heel strike and toe off.  Backward with VC for toe strike and heel off. Multiple widths.  Pt Edu: HEP: stretching gastroc on step, freezing water bottle and rolling on bottom of foot, icing achilles and toe raises.  Pt requires buoyancy for support and to offload joints with strengthening exercises. Viscosity of the water is needed for resistance of strengthening; water current perturbations provides challenge to standing balance unsupported, requiring increased core activation.        Plan - 07/19/21 1353     Clinical Impression Statement Pt is introduced to setting.  Read MRI results to pt explaining for her to better understan. Pt has had pain relief from staying out of work last 4 days.  Focus is on ROM and stretching of left ankle and gait pattern, exagerating heel strike and toe off.  Pt without discomfort in ankle/foot as session continues. She is a good candidate for aquatic therapy off loading ankle and foot to strengthen more  effectively.    Stability/Clinical Decision Making Stable/Uncomplicated    Clinical Decision Making Low    Rehab Potential Good    PT Frequency 1x / week    PT Treatment/Interventions ADLs/Self Care Home Management;Aquatic Therapy;Electrical Stimulation;Cryotherapy;Iontophoresis 4mg /ml Dexamethasone;Moist Heat;Traction;Ultrasound;Neuromuscular re-education;Balance training;Therapeutic exercise;Therapeutic activities;Functional mobility training;Stair training;Gait training;Patient/family education;Manual techniques;Dry needling;Passive range of motion;Taping;Vasopneumatic Device;Spinal Manipulations;Joint Manipulations    PT Next Visit Plan Review HEP and progress PRN, aquatic therapy to progress walking/strength/balance with decreased pain, calf stretching, progress ankle strength, balance training    PT Home Exercise Plan [id number]: added gentle gastroc stretching on step             Patient will benefit from skilled therapeutic intervention in order to improve the following deficits and impairments:  Abnormal gait, Decreased range of motion, Difficulty walking, Pain, Decreased activity tolerance, Impaired flexibility, Decreased balance, Decreased strength, Increased edema  Visit Diagnosis: Pain in left ankle and joints of left foot  Stiffness of left ankle, not elsewhere classified  Muscle weakness (generalized)  Other abnormalities of gait and mobility  Difficulty in walking, not elsewhere classified  Pain in left foot  Localized edema     Problem List Patient Active Problem List   Diagnosis Date Noted   Passive suicidal ideations 11/16/2020   Irregular bleeding 04/12/2020   Achilles tendinitis 03/11/2020   Ovarian cyst 02/19/2020   Deliberate self-cutting 01/23/2020   Non-suicidal self harm as coping mechanism (HCC) 12/25/2019   Diarrhea 06/13/2018   Hypomagnesemia 06/13/2018   Transaminitis 06/13/2018   C. difficile diarrhea 06/12/2018   Oral thrush 06/12/2018    Suicidal ideation 04/12/2018   Chest pain 03/28/2018   Obesity 03/28/2018   OSA (obstructive sleep apnea) 03/28/2018   Acute cystitis 03/10/2018   GERD (gastroesophageal reflux disease) 03/10/2018   Depression with suicidal ideation 03/09/2018   PTSD (post-traumatic stress disorder) 03/09/2018   MDD (major depressive disorder), recurrent severe, 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/2018   Polycystic disease, ovaries 07/05/2016   Abdominal pain, suprapubic 06/04/2016   Spondylisthesis 04/20/2015   Herniated lumbar intervertebral disc 02/28/2013   Sciatica of left side 02/28/2013   Borderline personality disorder (HCC) 12/29/2012   Anxiety 12/24/2012   COPD (chronic obstructive pulmonary disease) (HCC) 12/24/2012   Essential hypertension 12/24/2012   Low back pain 07/22/2012    09/21/2012, PT 07/19/2021, 2:00 PM  [facility] [address] [place], [place], [place] Phone: [phone]   Fax:  [phone]  Name: Natoria Archibald MRN: Baxter Kail Date of Birth: [birth year]

## 2021-07-25 ENCOUNTER — Other Ambulatory Visit: Payer: Self-pay

## 2021-07-25 ENCOUNTER — Encounter (HOSPITAL_BASED_OUTPATIENT_CLINIC_OR_DEPARTMENT_OTHER): Payer: Self-pay | Admitting: Physical Therapy

## 2021-07-25 ENCOUNTER — Ambulatory Visit (HOSPITAL_BASED_OUTPATIENT_CLINIC_OR_DEPARTMENT_OTHER): Payer: Medicare Other | Admitting: Physical Therapy

## 2021-07-25 DIAGNOSIS — M25572 Pain in left ankle and joints of left foot: Secondary | ICD-10-CM | POA: Diagnosis not present

## 2021-07-25 DIAGNOSIS — R2689 Other abnormalities of gait and mobility: Secondary | ICD-10-CM

## 2021-07-25 DIAGNOSIS — R262 Difficulty in walking, not elsewhere classified: Secondary | ICD-10-CM

## 2021-07-25 DIAGNOSIS — M6281 Muscle weakness (generalized): Secondary | ICD-10-CM

## 2021-07-25 DIAGNOSIS — M79672 Pain in left foot: Secondary | ICD-10-CM

## 2021-07-25 DIAGNOSIS — M25672 Stiffness of left ankle, not elsewhere classified: Secondary | ICD-10-CM

## 2021-07-25 DIAGNOSIS — R600 Localized edema: Secondary | ICD-10-CM

## 2021-07-25 NOTE — Therapy (Signed)
[facility] [address] [place], [place], [address] Phone: [phone]   Fax:  [phone]  Physical Therapy Treatment  Patient Details  Name: Rebekah Davis MRN: [id number] Date of Birth: 1986/05/29 Referring Provider (PT): Terance Hart, MD   Encounter Date: 07/25/2021   PT End of Session - 07/25/21 1544     Visit Number 3    Number of Visits 8    Date for PT Re-Evaluation 08/31/21    Authorization Type MCR / MCD    Authorization Time Period FOTO by 6th, KX by 15th    Progress Note Due on Visit 10    PT Start Time 1532    PT Stop Time 1615    PT Time Calculation (min) 43 min    Equipment Utilized During Treatment Other (comment)    Activity Tolerance Patient tolerated treatment well    Behavior During Therapy [facility] for tasks assessed/performed             Past Medical History:  Diagnosis Date   Anxiety    Arthritis    Depression    History of kidney stones    Migraine    PCOS (polycystic ovarian syndrome)    PTSD (post-traumatic stress disorder)     Past Surgical History:  Procedure Laterality Date   TENOLYSIS Left 10/04/2020   Procedure: LEFT ACHILLES TENDON DEBRIDEMENT AND RECONSTRUCTION, CALCANEAL EXOSTECTOMY, GASTROCNEMIUS RECESSION,  FLEXOR HALLUCIS LONGUS TRANSFER;  Surgeon: Terance Hart, MD;  Location: [facility] OR;  Service: Orthopedics;  Laterality: Left;  LENGTH OF SURGERY: 1.5 HOURS    There were no vitals filed for this visit.   Subjective Assessment - 07/25/21 1606     Subjective Pt reporting falling the other day at church and hurt a pop.  Says instep has  been hurting slightly more with swe                                          PT Short Term Goals - 07/07/21 0820       PT SHORT TERM GOAL #1   Title Pt will be independent with her initial HEP to increase ankle flexibility and decrease pain.    Time 4    Period Weeks    Status New    Target  Date 08/03/21      PT SHORT TERM GOAL #2   Title Patient will exhibit >/= 5 deg left ankle DF to improve gait with </= 3/10 pain    Time 4    Period Weeks    Status New    Target Date 08/03/21      PT SHORT TERM GOAL #3   Title PT will review FOTO with patient by 3rd visit to understand anticipated progress with PT    Time 3    Period Weeks    Status New    Target Date 07/27/21               PT Long Term Goals - 07/07/21 0821       PT LONG TERM GOAL #1   Title Patient will be I with final HEP to maintain progress from PT    Time 8    Period Weeks    Status New    Target Date 08/31/21      PT LONG TERM GOAL #2   Title Patient will report >/= 57%  functional status on FOTO to indicate improved functional ability    Time 8    Period Weeks    Status New    Target Date 08/31/21      PT LONG TERM GOAL #3   Title Patient will demonstrate left ankle strength grossly 5/5 MMT in order to improve tolerance for standing and walking at work without increase in pain    Time 8    Period Weeks    Status New    Target Date 08/31/21      PT LONG TERM GOAL #4   Title Patient will be able to perform SL balance >/= 10 sec on left to indicate improved ankle coordination to reduce pain with walking    Time 8    Period Weeks    Status New    Target Date 08/31/21      PT LONG TERM GOAL #5   Title Patient will report ability to stand >/= 1 hour without limitation or need to sit to improve working ability    Time 8    Period Weeks    Status New    Target Date 08/31/21            Pt seen for aquatic therapy today.  Treatment took place in water 3.25-4.8 ft in depth at the [facility] pool. Temp of water was 94.  Pt entered/exited the pool via stairs step to pattern independently with bilat rail.   Pt entered water for aquatic therapy for first time and was introduced to principles and therapeutic effects of water as she ambulated and acclimated to pool. She was able  to ambulate forward across pool , then backwards to engage posterior chain and then sidesteping in 3.6 ft   Seated Stretching  gastroc and hamstrings 5 x 30 second hold Left foot/ankle joint mobilization   Standing Stretching: gastroc on bottom water step R/L.  -PF holding to wall 2 x 10 reps -DF holding to wall 2 x 10 reps submerged 80%.  She does c/o 2 episodes of shooting pain in left instep area that passes immediately     Gait training: forward and backward vc and demo for normal heel strike and toe off.  Backward with VC for toe strike and heel off. Multiple widths.     Pt requires buoyancy for support and to offload joints with strengthening exercises. Viscosity of the water is needed for resistance of strengthening; water current perturbations provides challenge to standing balance unsupported, requiring increased core activation.      Patient will benefit from skilled therapeutic intervention in order to improve the following deficits and impairments:     Visit Diagnosis: Pain in left ankle and joints of left foot  Difficulty in walking, not elsewhere classified  Pain in left foot  Stiffness of left ankle, not elsewhere classified  Muscle weakness (generalized)  Other abnormalities of gait and mobility  Localized edema     Problem List Patient Active Problem List   Diagnosis Date Noted   Passive suicidal ideations 11/16/2020   Irregular bleeding 04/12/2020   Achilles tendinitis 03/11/2020   Ovarian cyst 02/19/2020   Deliberate self-cutting 01/23/2020   Non-suicidal self harm as coping mechanism (HCC) 12/25/2019   Diarrhea 06/13/2018   Hypomagnesemia 06/13/2018   Transaminitis 06/13/2018   C. difficile diarrhea 06/12/2018   Oral thrush 06/12/2018   Suicidal ideation 04/12/2018   Chest pain 03/28/2018   Obesity 03/28/2018   OSA (obstructive sleep apnea) 03/28/2018   Acute cystitis  03/10/2018   GERD (gastroesophageal reflux disease) 03/10/2018    Depression with suicidal ideation 03/09/2018   PTSD (post-traumatic stress disorder) 03/09/2018   MDD (major depressive disorder), recurrent severe, 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/2018   Polycystic disease, ovaries 07/05/2016   Abdominal pain, suprapubic 06/04/2016   Spondylisthesis 04/20/2015   Herniated lumbar intervertebral disc 02/28/2013   Sciatica of left side 02/28/2013   Borderline personality disorder (HCC) 12/29/2012   Anxiety 12/24/2012   COPD (chronic obstructive pulmonary disease) (HCC) 12/24/2012   Essential hypertension 12/24/2012   Low back pain 07/22/2012    Rushie Chestnut) Chico Cawood MPT  07/25/2021, 6:32 PM  [facility] [address] [place], [place], [address] Phone: [phone]   Fax:  [phone]  Name: Rebekah Davis MRN: [id number] Date of Birth: 05-07-1986

## 2021-08-01 ENCOUNTER — Ambulatory Visit (HOSPITAL_BASED_OUTPATIENT_CLINIC_OR_DEPARTMENT_OTHER): Payer: Medicare Other | Admitting: Physical Therapy

## 2021-08-01 ENCOUNTER — Other Ambulatory Visit: Payer: Self-pay

## 2021-08-01 ENCOUNTER — Encounter (HOSPITAL_BASED_OUTPATIENT_CLINIC_OR_DEPARTMENT_OTHER): Payer: Self-pay | Admitting: Physical Therapy

## 2021-08-01 DIAGNOSIS — M25561 Pain in right knee: Secondary | ICD-10-CM

## 2021-08-01 DIAGNOSIS — R293 Abnormal posture: Secondary | ICD-10-CM

## 2021-08-01 DIAGNOSIS — R2689 Other abnormalities of gait and mobility: Secondary | ICD-10-CM

## 2021-08-01 DIAGNOSIS — R262 Difficulty in walking, not elsewhere classified: Secondary | ICD-10-CM

## 2021-08-01 DIAGNOSIS — R2681 Unsteadiness on feet: Secondary | ICD-10-CM

## 2021-08-01 DIAGNOSIS — G8929 Other chronic pain: Secondary | ICD-10-CM

## 2021-08-01 DIAGNOSIS — M25572 Pain in left ankle and joints of left foot: Secondary | ICD-10-CM | POA: Diagnosis not present

## 2021-08-01 DIAGNOSIS — M6281 Muscle weakness (generalized): Secondary | ICD-10-CM

## 2021-08-01 NOTE — Therapy (Signed)
[facility] [address] [place], [place], [address] Phone: [phone]   Fax:  [phone]  Physical Therapy Treatment  Patient Details  Name: Rebekah Davis MRN: [id number] Date of Birth: 09-11-1986 Referring Provider (PT): Terance Hart, MD   Encounter Date: 08/01/2021   PT End of Session - 08/01/21 1622     Visit Number 4    Number of Visits 8    Date for PT Re-Evaluation 08/31/21    Authorization Type MCR / MCD    Authorization Time Period FOTO by 6th, KX by 15th    Progress Note Due on Visit 10    PT Start Time 1616    PT Stop Time 1700    PT Time Calculation (min) 44 min    Equipment Utilized During Treatment Other (comment)    Activity Tolerance Patient tolerated treatment well    Behavior During Therapy [facility] for tasks assessed/performed             Past Medical History:  Diagnosis Date   Anxiety    Arthritis    Depression    History of kidney stones    Migraine    PCOS (polycystic ovarian syndrome)    PTSD (post-traumatic stress disorder)     Past Surgical History:  Procedure Laterality Date   TENOLYSIS Left 10/04/2020   Procedure: LEFT ACHILLES TENDON DEBRIDEMENT AND RECONSTRUCTION, CALCANEAL EXOSTECTOMY, GASTROCNEMIUS RECESSION,  FLEXOR HALLUCIS LONGUS TRANSFER;  Surgeon: Terance Hart, MD;  Location: [facility] OR;  Service: Orthopedics;  Laterality: Left;  LENGTH OF SURGERY: 1.5 HOURS    There were no vitals filed for this visit.   Subjective Assessment - 08/01/21 1811     Subjective 'I am getting alot of tingling in my instep.  Still hurting alot."    Currently in Pain? Yes    Pain Score 6     Pain Location Heel    Pain Orientation Left    Pain Descriptors / Indicators Aching;Burning;Sharp;Pins and needles    Pain Type Chronic pain    Pain Onset More than a month ago    Pain Frequency Constant    Pain Relieving Factors Ice elevation, rest ,medication and wearing boot                                           PT Short Term Goals - 07/07/21 0820       PT SHORT TERM GOAL #1   Title Pt will be independent with her initial HEP to increase ankle flexibility and decrease pain.    Time 4    Period Weeks    Status New    Target Date 08/03/21      PT SHORT TERM GOAL #2   Title Patient will exhibit >/= 5 deg left ankle DF to improve gait with </= 3/10 pain    Time 4    Period Weeks    Status New    Target Date 08/03/21      PT SHORT TERM GOAL #3   Title PT will review FOTO with patient by 3rd visit to understand anticipated progress with PT    Time 3    Period Weeks    Status New    Target Date 07/27/21               PT Long Term Goals - 07/07/21 1062  PT LONG TERM GOAL #1   Title Patient will be I with final HEP to maintain progress from PT    Time 8    Period Weeks    Status New    Target Date 08/31/21      PT LONG TERM GOAL #2   Title Patient will report >/= 57% functional status on FOTO to indicate improved functional ability    Time 8    Period Weeks    Status New    Target Date 08/31/21      PT LONG TERM GOAL #3   Title Patient will demonstrate left ankle strength grossly 5/5 MMT in order to improve tolerance for standing and walking at work without increase in pain    Time 8    Period Weeks    Status New    Target Date 08/31/21      PT LONG TERM GOAL #4   Title Patient will be able to perform SL balance >/= 10 sec on left to indicate improved ankle coordination to reduce pain with walking    Time 8    Period Weeks    Status New    Target Date 08/31/21      PT LONG TERM GOAL #5   Title Patient will report ability to stand >/= 1 hour without limitation or need to sit to improve working ability    Time 8    Period Weeks    Status New    Target Date 08/31/21            Pt seen for aquatic therapy today.  Treatment took place in water 3.25-4.8 ft in depth at the [facility] pool.  Temp of water was 94.  Pt entered/exited the pool via stairs step to pattern independently with bilat rail.   Warm up: forward, backward and side stepping multiple passes in pool.  Mobilization left foot and ankle   Seated Stretching  gastroc and hamstrings 5 x 30 second hold Left foot/ankle joint mobilization Lb stretch pike psodion Hamstring and gastroc Standing Submerged 80% -PF holding to wall 2 x 10 reps -DF holding to wall 2 x 10 reps  Stretchg hamstring and quad, adductors Strengthening add/abd 2x10 vc for increased speed for increased resistance.     Pt requires buoyancy for support and to offload joints with strengthening exercises. Viscosity of the water is needed for resistance of strengthening; water current perturbations provides challenge to standing balance unsupported, requiring increased core activation.        Plan - 08/01/21 1813     Clinical Impression Statement Pt reports limited completion of HEP due to pain althgough she is trying.  Sees Md tomorrow. Next PT visit on land. Of Note: pt with tight hip flex and extensors and is reporting some "sciatica" pain on right. I believe pt needs to wear boot when working to limit edema or use compression /support hose as edema may be reason for most pain.  Her ROM looks wfl in pool setting with the exception of full PF limited slightly due to post ankle edema. Pt tender with palpation bialterally to achilles tendon.  She is able to tolerate DF/PF submerged 80%, unable to tolerate well shallower.  Did squating with pt in 3 foot then on first water step which she tolerated without increased ankle discomfort. VC needed for L great toe extension with heel strike and with squatting.    Clinical Decision Making Low    Rehab Potential Good  PT Frequency 1x / week    PT Treatment/Interventions ADLs/Self Care Home Management;Aquatic Therapy;Electrical Stimulation;Cryotherapy;Iontophoresis 4mg /ml Dexamethasone;Moist  Heat;Traction;Ultrasound;Neuromuscular re-education;Balance training;Therapeutic exercise;Therapeutic activities;Functional mobility training;Stair training;Gait training;Patient/family education;Manual techniques;Dry needling;Passive range of motion;Taping;Vasopneumatic Device;Spinal Manipulations;Joint Manipulations    PT Home Exercise Plan [id number]             Patient will benefit from skilled therapeutic intervention in order to improve the following deficits and impairments:  Abnormal gait, Decreased range of motion, Difficulty walking, Pain, Decreased activity tolerance, Impaired flexibility, Decreased balance, Decreased strength, Increased edema  Visit Diagnosis: Abnormal posture  Chronic pain of right knee  Difficulty in walking, not elsewhere classified  Muscle weakness (generalized)  Unsteadiness on feet  Other abnormalities of gait and mobility     Problem List Patient Active Problem List   Diagnosis Date Noted   Passive suicidal ideations 11/16/2020   Irregular bleeding 04/12/2020   Achilles tendinitis 03/11/2020   Ovarian cyst 02/19/2020   Deliberate self-cutting 01/23/2020   Non-suicidal self harm as coping mechanism (HCC) 12/25/2019   Diarrhea 06/13/2018   Hypomagnesemia 06/13/2018   Transaminitis 06/13/2018   C. difficile diarrhea 06/12/2018   Oral thrush 06/12/2018   Suicidal ideation 04/12/2018   Chest pain 03/28/2018   Obesity 03/28/2018   OSA (obstructive sleep apnea) 03/28/2018   Acute cystitis 03/10/2018   GERD (gastroesophageal reflux disease) 03/10/2018   Depression with suicidal ideation 03/09/2018   PTSD (post-traumatic stress disorder) 03/09/2018   MDD (major depressive disorder), recurrent severe, 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/2018   Polycystic disease, ovaries 07/05/2016   Abdominal pain, suprapubic 06/04/2016   Spondylisthesis 04/20/2015   Herniated lumbar intervertebral disc 02/28/2013   Sciatica of left side 02/28/2013    Borderline personality disorder (HCC) 12/29/2012   Anxiety 12/24/2012   COPD (chronic obstructive pulmonary disease) (HCC) 12/24/2012   Essential hypertension 12/24/2012   Low back pain 07/22/2012    09/21/2012) Rosann Gorum MPT 08/01/2021, 6:25 PM  [facility] [address] [place], [place], [place] Phone: [phone]   Fax:  [phone]  Name: Toia Micale MRN: Baxter Kail Date of Birth: 1986-04-28

## 2021-08-08 ENCOUNTER — Ambulatory Visit: Payer: Medicare Other | Admitting: Physical Therapy

## 2021-08-21 ENCOUNTER — Ambulatory Visit: Payer: Medicare Other | Admitting: Physical Therapy

## 2021-08-30 ENCOUNTER — Other Ambulatory Visit: Payer: Self-pay

## 2021-08-30 ENCOUNTER — Ambulatory Visit: Payer: Medicare Other | Attending: Orthopaedic Surgery

## 2021-08-30 DIAGNOSIS — M79672 Pain in left foot: Secondary | ICD-10-CM | POA: Diagnosis present

## 2021-08-30 DIAGNOSIS — M25561 Pain in right knee: Secondary | ICD-10-CM | POA: Diagnosis present

## 2021-08-30 DIAGNOSIS — G8929 Other chronic pain: Secondary | ICD-10-CM | POA: Diagnosis present

## 2021-08-30 DIAGNOSIS — R293 Abnormal posture: Secondary | ICD-10-CM | POA: Insufficient documentation

## 2021-08-30 DIAGNOSIS — R262 Difficulty in walking, not elsewhere classified: Secondary | ICD-10-CM | POA: Insufficient documentation

## 2021-08-30 NOTE — Therapy (Signed)
[place], [place], [address] Phone: [phone]   Fax:  [phone]  Physical Therapy Treatment  Patient Details  Name: Rebekah Davis MRN: [id number] Date of Birth: [birth year] Referring Provider (PT): Erle Crocker, MD   Encounter Date: 08/30/2021   PT End of Session - 08/30/21 1725     Visit Number 5    Number of Visits 8    Date for PT Re-Evaluation 08/31/21    Authorization Type MCR / MCD    Authorization Time Period FOTO by 6th, KX by 15th    Progress Note Due on Visit 10    PT Start Time 1730    PT Stop Time 1810    PT Time Calculation (min) 40 min    Equipment Utilized During Treatment Other (comment)    Activity Tolerance Patient tolerated treatment well    Behavior During Therapy [facility] for tasks assessed/performed             Past Medical History:  Diagnosis Date   Anxiety    Arthritis    Depression    History of kidney stones    Migraine    PCOS (polycystic ovarian syndrome)    PTSD (post-traumatic stress disorder)     Past Surgical History:  Procedure Laterality Date   TENOLYSIS Left 10/04/2020   Procedure: LEFT ACHILLES TENDON DEBRIDEMENT AND RECONSTRUCTION, CALCANEAL EXOSTECTOMY, GASTROCNEMIUS RECESSION,  FLEXOR HALLUCIS LONGUS TRANSFER;  Surgeon: Erle Crocker, MD;  Location: [place];  Service: Orthopedics;  Laterality: Left;  LENGTH OF SURGERY: 1.5 HOURS    There were no vitals filed for this visit.   Subjective Assessment - 08/30/21 1725     Subjective Pt presents to PT with reports of decreased L heel pain after moving jobs two weeks ago. Her job duties are now mainly performed in sitting, which she thinks decreased pain. She can only descend stairs in a step-to pattern and has increase in pain. Pt is ready to begin PT treatment at this time.    How long can you sit comfortably? n/a    How long can you stand comfortably? 30-40 min    How long can you walk  comfortably? 45 minutes    Currently in Pain? Yes    Pain Score 4     Pain Location Heel    Pain Orientation Left           [facility] Adult PT Treatment/Exercise:   Therapeutic Exercise:  Ankle 4-way L ankle 2x10 YTB STS 2x10 - no UE support  L calf stretch w/ strap 2x30 sec BAPS CW/CCW x 10 ea L Seated heel raise 2x20 Tandem stance L back x 30 sec  Therapeutic Activities:  Assessment of goals, tests/measures, and outcomes                             PT Education - 08/30/21 1806     Education Details HEP update - POC update    Person(s) Educated Patient    Methods Explanation;Demonstration;Handout    Comprehension Verbalized understanding;Returned demonstration              PT Short Term Goals - 08/30/21 1736       PT SHORT TERM GOAL #1   Title Pt will be independent with her initial HEP to increase ankle flexibility and decrease pain.    Time 4    Period Weeks    Status Achieved  Target Date 08/03/21      PT SHORT TERM GOAL #2   Title Patient will exhibit >/= 5 deg left ankle DF to improve gait with </= 3/10 pain    Time 4    Period Weeks    Status New    Target Date 08/03/21      PT SHORT TERM GOAL #3   Title PT will review FOTO with patient by 3rd visit to understand anticipated progress with PT    Time 3    Period Weeks    Status Achieved    Target Date 07/27/21               PT Long Term Goals - 08/30/21 1755       PT LONG TERM GOAL #1   Title Patient will be I with final HEP to maintain progress from PT    Time 10    Period Weeks    Status New    Target Date 11/08/21      PT LONG TERM GOAL #2   Title Patient will report >/= 57% functional status on FOTO to indicate improved functional ability    Baseline 50% function on 11/16    Time 10    Period Weeks    Status On-going    Target Date 11/08/21      PT LONG TERM GOAL #3   Title Patient will demonstrate left ankle strength grossly 5/5 MMT in order to  improve tolerance for standing and walking at work without increase in pain    Time 10    Period Weeks    Status On-going    Target Date 11/08/21      PT LONG TERM GOAL #4   Title Patient will be able to perform SL balance >/= 20 sec on left to indicate improved ankle coordination to reduce pain with walking    Baseline 8 sec on 11/16 - goal updated to 20 sec    Time 10    Period Weeks    Status New    Target Date 11/08/21      PT LONG TERM GOAL #5   Title Patient will report ability to stand >/= 1 hour without limitation or need to sit to improve working ability    Baseline 30-40 min on 08/30/21    Time 10    Period Weeks    Status On-going    Target Date 11/08/21                   Plan - 08/30/21 1817     Clinical Impression Statement Pt was able to complete prescribed exercises with no adverse effect or increase in pain. Over the course of PT treatment, she has made progress towards meeting LTGs, showing improving balance and functional mobility. She does continue to have some slight discomfort L heel, especially with prolonged standing. Pt was also able to perform step-through patten after PT instruction, with no increase in pain noted. She would continue to benefit from skilled PT services working on improving distal LE strength, L ankle proprioception, and L ankle motor control. Will continue to alternate gym and aquatic sessions and progress activity as tolerated.    Personal Factors and Comorbidities Fitness;Past/Current Experience;Time since onset of injury/illness/exacerbation    Examination-Activity Limitations Squat;Stand    Examination-Participation Restrictions Meal Prep;Cleaning;Occupation;Community Activity;Shop    PT Treatment/Interventions ADLs/Self Care Home Management;Aquatic Therapy;Electrical Stimulation;Cryotherapy;Iontophoresis 4mg /ml Dexamethasone;Moist Heat;Traction;Ultrasound;Neuromuscular re-education;Balance training;Therapeutic exercise;Therapeutic  activities;Functional mobility training;Stair training;Gait training;Patient/family education;Manual techniques;Dry needling;Passive  range of motion;Taping;Vasopneumatic Device;Spinal Manipulations;Joint Manipulations    PT Next Visit Plan continue to progress with alternating gym and aquatic sessions    PT Home Exercise Plan [id number]    Consulted and Agree with Plan of Care Patient             Patient will benefit from skilled therapeutic intervention in order to improve the following deficits and impairments:  Abnormal gait, Decreased range of motion, Difficulty walking, Pain, Decreased activity tolerance, Impaired flexibility, Decreased balance, Decreased strength, Increased edema  Visit Diagnosis: Abnormal posture  Chronic pain of right knee  Difficulty in walking, not elsewhere classified  Pain in left foot     Problem List Patient Active Problem List   Diagnosis Date Noted   Passive suicidal ideations 11/16/2020   Irregular bleeding 04/12/2020   Achilles tendinitis 03/11/2020   Ovarian cyst 02/19/2020   Deliberate self-cutting 01/23/2020   Non-suicidal self harm as coping mechanism ([place]) 12/25/2019   Diarrhea 06/13/2018   Hypomagnesemia 06/13/2018   Transaminitis 06/13/2018   C. difficile diarrhea 06/12/2018   Oral thrush 06/12/2018   Suicidal ideation 04/12/2018   Chest pain 03/28/2018   Obesity 03/28/2018   OSA (obstructive sleep apnea) 03/28/2018   Acute cystitis 03/10/2018   GERD (gastroesophageal reflux disease) 03/10/2018   Depression with suicidal ideation 03/09/2018   PTSD (post-traumatic stress disorder) 03/09/2018   MDD (major depressive disorder), recurrent severe, without psychosis ([place]) 02/27/2018   Polycystic disease, ovaries 07/05/2016   Abdominal pain, suprapubic 06/04/2016   Spondylisthesis 04/20/2015   Herniated lumbar intervertebral disc 02/28/2013   Sciatica of left side 02/28/2013   Borderline personality disorder ([place]) 12/29/2012    Anxiety 12/24/2012   COPD (chronic obstructive pulmonary disease) ([place]) 12/24/2012   Essential hypertension 12/24/2012   Low back pain 07/22/2012    Ward Chatters, PT 08/31/2021, 12:38 PM  [facility] [address] [place], [place], [address] Phone: [phone]   Fax:  [phone]  Name: Rebekah Davis MRN: [id number] Date of Birth: 02-06-1986

## 2021-09-03 ENCOUNTER — Other Ambulatory Visit: Payer: Self-pay | Admitting: Nurse Practitioner

## 2021-09-03 MED ORDER — ARIPIPRAZOLE 20 MG PO TABS: 20.0000 mg | ORAL_TABLET | Freq: Every day | ORAL | 11 refills | Status: DC

## 2021-09-06 ENCOUNTER — Ambulatory Visit: Payer: Self-pay | Admitting: Nurse Practitioner

## 2021-09-13 ENCOUNTER — Ambulatory Visit: Payer: Medicare Other

## 2021-09-20 ENCOUNTER — Ambulatory Visit: Payer: Self-pay | Admitting: Nurse Practitioner

## 2021-09-21 ENCOUNTER — Other Ambulatory Visit: Payer: Self-pay

## 2021-09-21 ENCOUNTER — Ambulatory Visit: Payer: Medicare Other | Attending: Orthopaedic Surgery

## 2021-09-21 DIAGNOSIS — R293 Abnormal posture: Secondary | ICD-10-CM | POA: Diagnosis not present

## 2021-09-21 DIAGNOSIS — G8929 Other chronic pain: Secondary | ICD-10-CM

## 2021-09-21 DIAGNOSIS — M79672 Pain in left foot: Secondary | ICD-10-CM

## 2021-09-21 DIAGNOSIS — R262 Difficulty in walking, not elsewhere classified: Secondary | ICD-10-CM

## 2021-09-21 DIAGNOSIS — M25561 Pain in right knee: Secondary | ICD-10-CM | POA: Insufficient documentation

## 2021-09-21 DIAGNOSIS — M6281 Muscle weakness (generalized): Secondary | ICD-10-CM

## 2021-09-21 NOTE — Therapy (Signed)
[place], [place], [address] Phone: [phone]   Fax:  [phone]  Physical Therapy Treatment  Patient Details  Name: Rebekah Davis MRN: [id number] Date of Birth: [birth year] Referring Provider (PT): Erle Crocker, MD   Encounter Date: 09/21/2021   PT End of Session - 09/21/21 1745     Visit Number 6    Number of Visits 8    Date for PT Re-Evaluation 08/31/21    Authorization Type MCR / MCD    Authorization Time Period FOTO by 6th, KX by 15th    Progress Note Due on Visit 10    PT Start Time 1745    PT Stop Time 1825    PT Time Calculation (min) 40 min    Equipment Utilized During Treatment Other (comment)    Activity Tolerance Patient tolerated treatment well    Behavior During Therapy [facility] for tasks assessed/performed             Past Medical History:  Diagnosis Date   Anxiety    Arthritis    Depression    History of kidney stones    Migraine    PCOS (polycystic ovarian syndrome)    PTSD (post-traumatic stress disorder)     Past Surgical History:  Procedure Laterality Date   TENOLYSIS Left 10/04/2020   Procedure: LEFT ACHILLES TENDON DEBRIDEMENT AND RECONSTRUCTION, CALCANEAL EXOSTECTOMY, GASTROCNEMIUS RECESSION,  FLEXOR HALLUCIS LONGUS TRANSFER;  Surgeon: Erle Crocker, MD;  Location: [place];  Service: Orthopedics;  Laterality: Left;  LENGTH OF SURGERY: 1.5 HOURS    There were no vitals filed for this visit.   Subjective Assessment - 09/21/21 1747     Subjective Pt presents to PT with continued decrease in L heel pain. Notes that new job has helped in decreasing her discomfort. Pt has been compliant with HEP with no adverse effect. She is ready to begin PT at this time.    Currently in Pain? Yes    Pain Score 3     Pain Location Heel    Pain Orientation Left           [facility] Adult PT Treatment/Exercise:   Therapeutic Exercise:  NuStep lvl 6 x 4 min while taking  subjective Step up 8in 2x10 fwd ea Heel-Toe raise x 15 Knee ext 2x10 5lb L only  Hamstring curl 2x10 25lb L only  Cybex hip abd/ext 2x10 12.5lb L ankle PF x 20 black TB  Past Interventions Not Performed Today: Ankle 4-way L ankle 2x10 YTB STS 2x10 - no UE support  L calf stretch w/ strap 2x30 sec BAPS CW/CCW x 10 ea L Seated heel raise 2x20  Manual Therapy: N/A   Neuromuscular re-ed: Tandem walk x 2 laps in // SLS 2x30" ea   Therapeutic Activity: N/A   Modalities: N/A   Self Care: N/A   Consider / progression for next session:                                PT Short Term Goals - 08/30/21 1736       PT SHORT TERM GOAL #1   Title Pt will be independent with her initial HEP to increase ankle flexibility and decrease pain.    Time 4    Period Weeks    Status Achieved    Target Date 08/03/21      PT SHORT TERM GOAL #2  Title Patient will exhibit >/= 5 deg left ankle DF to improve gait with </= 3/10 pain    Time 4    Period Weeks    Status New    Target Date 08/03/21      PT SHORT TERM GOAL #3   Title PT will review FOTO with patient by 3rd visit to understand anticipated progress with PT    Time 3    Period Weeks    Status Achieved    Target Date 07/27/21               PT Long Term Goals - 08/30/21 1755       PT LONG TERM GOAL #1   Title Patient will be I with final HEP to maintain progress from PT    Time 10    Period Weeks    Status New    Target Date 11/08/21      PT LONG TERM GOAL #2   Title Patient will report >/= 57% functional status on FOTO to indicate improved functional ability    Baseline 50% function on 11/16    Time 10    Period Weeks    Status On-going    Target Date 11/08/21      PT LONG TERM GOAL #3   Title Patient will demonstrate left ankle strength grossly 5/5 MMT in order to improve tolerance for standing and walking at work without increase in pain    Time 10    Period Weeks    Status  On-going    Target Date 11/08/21      PT LONG TERM GOAL #4   Title Patient will be able to perform SL balance >/= 20 sec on left to indicate improved ankle coordination to reduce pain with walking    Baseline 8 sec on 11/16 - goal updated to 20 sec    Time 10    Period Weeks    Status New    Target Date 11/08/21      PT LONG TERM GOAL #5   Title Patient will report ability to stand >/= 1 hour without limitation or need to sit to improve working ability    Baseline 30-40 min on 08/30/21    Time 10    Period Weeks    Status On-going    Target Date 11/08/21                   Plan - 09/21/21 1800     Clinical Impression Statement Pt was able to complete all prescribed exercises with no adverse effect or increase in pain. Therapy today focused on improving LE strength and L ankle stability. She continues to progress well with therapy, showing improving strength and activity tolerance. PT will continue to progress as tolerated per POC as prescribed.    PT Treatment/Interventions ADLs/Self Care Home Management;Aquatic Therapy;Electrical Stimulation;Cryotherapy;Iontophoresis 4mg /ml Dexamethasone;Moist Heat;Traction;Ultrasound;Neuromuscular re-education;Balance training;Therapeutic exercise;Therapeutic activities;Functional mobility training;Stair training;Gait training;Patient/family education;Manual techniques;Dry needling;Passive range of motion;Taping;Vasopneumatic Device;Spinal Manipulations;Joint Manipulations    PT Next Visit Plan continue to progress with alternating gym and aquatic sessions    PT Home Exercise Plan [id number]             Patient will benefit from skilled therapeutic intervention in order to improve the following deficits and impairments:  Abnormal gait, Decreased range of motion, Difficulty walking, Pain, Decreased activity tolerance, Impaired flexibility, Decreased balance, Decreased strength, Increased edema  Visit Diagnosis: Abnormal posture  Chronic  pain of right knee  Difficulty  in walking, not elsewhere classified  Pain in left foot  Muscle weakness (generalized)     Problem List Patient Active Problem List   Diagnosis Date Noted   Passive suicidal ideations 11/16/2020   Irregular bleeding 04/12/2020   Achilles tendinitis 03/11/2020   Ovarian cyst 02/19/2020   Deliberate self-cutting 01/23/2020   Non-suicidal self harm as coping mechanism ([place]) 12/25/2019   Diarrhea 06/13/2018   Hypomagnesemia 06/13/2018   Transaminitis 06/13/2018   C. difficile diarrhea 06/12/2018   Oral thrush 06/12/2018   Suicidal ideation 04/12/2018   Chest pain 03/28/2018   Obesity 03/28/2018   OSA (obstructive sleep apnea) 03/28/2018   Acute cystitis 03/10/2018   GERD (gastroesophageal reflux disease) 03/10/2018   Depression with suicidal ideation 03/09/2018   PTSD (post-traumatic stress disorder) 03/09/2018   MDD (major depressive disorder), recurrent severe, without psychosis ([place]) 02/27/2018   Polycystic disease, ovaries 07/05/2016   Abdominal pain, suprapubic 06/04/2016   Spondylisthesis 04/20/2015   Herniated lumbar intervertebral disc 02/28/2013   Sciatica of left side 02/28/2013   Borderline personality disorder ([place]) 12/29/2012   Anxiety 12/24/2012   COPD (chronic obstructive pulmonary disease) ([place]) 12/24/2012   Essential hypertension 12/24/2012   Low back pain 07/22/2012    Ward Chatters, PT 09/21/2021, 6:27 PM  [facility] [address] [place], [place], [address] Phone: [phone]   Fax:  [phone]  Name: Rebekah Davis MRN: [id number] Date of Birth: 08-31-1986

## 2021-09-22 ENCOUNTER — Encounter: Payer: Self-pay | Admitting: Nurse Practitioner

## 2021-09-22 ENCOUNTER — Ambulatory Visit (INDEPENDENT_AMBULATORY_CARE_PROVIDER_SITE_OTHER): Payer: Medicare Other | Admitting: Nurse Practitioner

## 2021-09-22 VITALS — BP 144/67 | HR 130 | Temp 98.4°F | Ht 65.0 in | Wt 307.0 lb

## 2021-09-22 DIAGNOSIS — Z13220 Encounter for screening for lipoid disorders: Secondary | ICD-10-CM | POA: Diagnosis not present

## 2021-09-22 DIAGNOSIS — F332 Major depressive disorder, recurrent severe without psychotic features: Secondary | ICD-10-CM

## 2021-09-22 DIAGNOSIS — F419 Anxiety disorder, unspecified: Secondary | ICD-10-CM

## 2021-09-22 DIAGNOSIS — Z6841 Body Mass Index (BMI) 40.0 and over, adult: Secondary | ICD-10-CM

## 2021-09-22 DIAGNOSIS — R000 Tachycardia, unspecified: Secondary | ICD-10-CM

## 2021-09-22 DIAGNOSIS — Z79899 Other long term (current) drug therapy: Secondary | ICD-10-CM

## 2021-09-22 NOTE — Progress Notes (Signed)
Integrated Behavioral Health Case Management Referral Note  09/22/2021 Name: [name] MRN: [id number] DOB: 30-Aug-1986 Rebekah Davis is a 35 y.o. year old female who sees Barbette Merino, NP for primary care. LCSW was consulted to assess patient's needs and assist the patient with Mental Health Counseling and Resources and housing .  Interpreter: No.   Interpreter Name & Language: none  Assessment: Patient experiencing Housing barriers and mental health concerns - anxiety. She is involved with [employer] but would like to move out. 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   Intervention: Patient wanted more information on housing options in the area. She is not sure of safe places to live. Discussed patient's income and rent budget and possible places to start looking. Patient may consider [employer] She just started working so has some income for rent, though affordability and safety is a concern. Patient has experienced significant loss in the past year and is also dealing with significant anxiety. She sees a therapist. CSW to follow.   Review of patient status, including review of consultants reports, relevant laboratory and other test results, and collaboration with appropriate care team members and the patient's provider was performed as part of comprehensive patient evaluation and provision of services.    Abigail Butts, LCSW [facility] [phone]

## 2021-09-22 NOTE — Patient Instructions (Signed)
Managing Anxiety, Adult ?After being diagnosed with anxiety, you may be relieved to know why you have felt or behaved a certain way. You may also feel overwhelmed about the treatment ahead and what it will mean for your life. With care and support, you can manage this condition. ?How to manage lifestyle changes ?Managing stress and anxiety ?Stress is your body's reaction to life changes and events, both good and bad. Most stress will last just a few hours, but stress can be ongoing and can lead to more than just stress. Although stress can play a major role in anxiety, it is not the same as anxiety. Stress is usually caused by something external, such as a deadline, test, or competition. Stress normally passes after the triggering event has ended.  ?Anxiety is caused by something internal, such as imagining a terrible outcome or worrying that something will go wrong that will devastate you. Anxiety often does not go away even after the triggering event is over, and it can become long-term (chronic) worry. It is important to understand the differences between stress and anxiety and to manage your stress effectively so that it does not lead to an anxious response. ?Talk with your health care provider or a counselor to learn more about reducing anxiety and stress. He or she may suggest tension reduction techniques, such as: ?Music therapy. Spend time creating or listening to music that you enjoy and that inspires you. ?Mindfulness-based meditation. Practice being aware of your normal breaths while not trying to control your breathing. It can be done while sitting or walking. ?Centering prayer. This involves focusing on a word, phrase, or sacred image that means something to you and brings you peace. ?Deep breathing. To do this, expand your stomach and inhale slowly through your nose. Hold your breath for 3-5 seconds. Then exhale slowly, letting your stomach muscles relax. ?Self-talk. Learn to notice and identify  thought patterns that lead to anxiety reactions and change those patterns to thoughts that feel peaceful. ?Muscle relaxation. Taking time to tense muscles and then relax them. ?Choose a tension reduction technique that fits your lifestyle and personality. These techniques take time and practice. Set aside 5-15 minutes a day to do them. Therapists can offer counseling and training in these techniques. The training to help with anxiety may be covered by some insurance plans. ?Other things you can do to manage stress and anxiety include: ?Keeping a stress diary. This can help you learn what triggers your reaction and then learn ways to manage your response. ?Thinking about how you react to certain situations. You may not be able to control everything, but you can control your response. ?Making time for activities that help you relax and not feeling guilty about spending your time in this way. ?Doing visual imagery. This involves imagining or creating mental pictures to help you relax. ?Practicing yoga. Through yoga poses, you can lower tension and promote relaxation. ? ?Medicines ?Medicines can help ease symptoms. Medicines for anxiety include: ?Antidepressant medicines. These are usually prescribed for long-term daily control. ?Anti-anxiety medicines. These may be added in severe cases, especially when panic attacks occur. ?Medicines will be prescribed by a health care provider. When used together, medicines, psychotherapy, and tension reduction techniques may be the most effective treatment. ?Relationships ?Relationships can play a big part in helping you recover. Try to spend more time connecting with trusted friends and family members. ?Consider going to couples counseling if you have a partner, taking family education classes, or going to family   therapy. ?Therapy can help you and others better understand your condition. ?How to recognize changes in your anxiety ?Everyone responds differently to treatment for  anxiety. Recovery from anxiety happens when symptoms decrease and stop interfering with your daily activities at home or work. This may mean that you will start to: ?Have better concentration and focus. Worry will interfere less in your daily thinking. ?Sleep better. ?Be less irritable. ?Have more energy. ?Have improved memory. ?It is also important to recognize when your condition is getting worse. Contact your health care provider if your symptoms interfere with home or work and you feel like your condition is not improving. ?Follow these instructions at home: ?Activity ?Exercise. Adults should do the following: ?Exercise for at least 150 minutes each week. The exercise should increase your heart rate and make you sweat (moderate-intensity exercise). ?Strengthening exercises at least twice a week. ?Get the right amount and quality of sleep. Most adults need 7-9 hours of sleep each night. ?Lifestyle ? ?Eat a healthy diet that includes plenty of vegetables, fruits, whole grains, low-fat dairy products, and lean protein. ?Do not eat a lot of foods that are high in fats, added sugars, or salt (sodium). ?Make choices that simplify your life. ?Do not use any products that contain nicotine or tobacco. These products include cigarettes, chewing tobacco, and vaping devices, such as e-cigarettes. If you need help quitting, ask your health care provider. ?Avoid caffeine, alcohol, and certain over-the-counter cold medicines. These may make you feel worse. Ask your pharmacist which medicines to avoid. ?General instructions ?Take over-the-counter and prescription medicines only as told by your health care provider. ?Keep all follow-up visits. This is important. ?Where to find support ?You can get help and support from these sources: ?Self-help groups. ?Online and community organizations. ?A trusted spiritual leader. ?Couples counseling. ?Family education classes. ?Family therapy. ?Where to find more information ?You may find  that joining a support group helps you deal with your anxiety. The following sources can help you locate counselors or support groups near you: ?Mental Health America: www.mentalhealthamerica.net ?Anxiety and Depression Association of America (ADAA): www.adaa.org ?National Alliance on Mental Illness (NAMI): www.nami.org ?Contact a health care provider if: ?You have a hard time staying focused or finishing daily tasks. ?You spend many hours a day feeling worried about everyday life. ?You become exhausted by worry. ?You start to have headaches or frequently feel tense. ?You develop chronic nausea or diarrhea. ?Get help right away if: ?You have a racing heart and shortness of breath. ?You have thoughts of hurting yourself or others. ?If you ever feel like you may hurt yourself or others, or have thoughts about taking your own life, get help right away. Go to your nearest emergency department or: ?Call your local emergency services (911 in the [place] ?Call a suicide crisis helpline, such as the National Suicide Prevention Lifeline at 1-800-273-8255 or 988 in the [place] This is open 24 hours a day in the [place] ?Text the Crisis Text Line at 741741 (in the [place] ?Summary ?Taking steps to learn and use tension reduction techniques can help calm you and help prevent triggering an anxiety reaction. ?When used together, medicines, psychotherapy, and tension reduction techniques may be the most effective treatment. ?Family, friends, and partners can play a big part in supporting you. ?This information is not intended to replace advice given to you by your health care provider. Make sure you discuss any questions you have with your health care provider. ?Document Revised: 04/26/2021 Document Reviewed: 01/22/2021 ?Elsevier Patient   Education ? 2022 Elsevier Inc. ? ?

## 2021-09-22 NOTE — Progress Notes (Signed)
[facility] Patient [facility] [name] [place], [place]  [address] Phone:  [phone]   Fax:  [phone] in the warm [place]  Established Patient Office Visit  Subjective:  Patient ID: Rebekah Davis, female    DOB: 1986/04/07  Age: 35 y.o. MRN: [id number]  CC:  Chief Complaint  Patient presents with   Follow-up    Feels dizzy while working, she has 2 computer screens and looking back and forward causes dizziness.     HPI Bessye Stith presents for anxiety. She  has a past medical history of Anxiety, Arthritis, Depression, History of kidney stones, Migraine, PCOS (polycystic ovarian syndrome), and PTSD (post-traumatic stress disorder).   She is in today for dizziness. She has a new job working in an office. She has 3 large monitors and has to complete continues surveillance as part of her job efficiency. This is causing dizziness. She denies any falls. She denies any blurred vision, headaches or nausea.   Her best friend say daugther has been diagnosed with cancer with brain mets. She reports visiting them over the weekend and had a anxiety attack. This was related to have to drive up the mountains alone. She deal once arrival due experienced additional anxiety with returning home.   She has a therapist with [facility]; Ester.She has been learning new techniques to deal with anxiety. She was given mindfulness recently. She is not sure how effective this has been due to a learning curve.  She is having more anxiety attacks this week. She reports they are directly correlated with fears. She is currently prescribed Lexapro 15 mg daily and Abilify 10 mg daily. She is concern about her heart rate. Her previous EKG was normal. She would like to start Vistaril which she has taken in the past. However this is contraindicated with the [employer].  Past Medical History:  Diagnosis Date   Anxiety    Arthritis    Depression    History of kidney stones    Migraine    PCOS  (polycystic ovarian syndrome)    PTSD (post-traumatic stress disorder)     Past Surgical History:  Procedure Laterality Date   TENOLYSIS Left 10/04/2020   Procedure: LEFT ACHILLES TENDON DEBRIDEMENT AND RECONSTRUCTION, CALCANEAL EXOSTECTOMY, GASTROCNEMIUS RECESSION,  FLEXOR HALLUCIS LONGUS TRANSFER;  Surgeon: Terance Hart, MD;  Location: [facility] OR;  Service: Orthopedics;  Laterality: Left;  LENGTH OF SURGERY: 1.5 HOURS    Family History  Problem Relation Age of Onset   Hypertension Mother    Diabetes Mother    Kidney disease Mother    Depression Mother    Hypertension Father    Diabetes Father     Social History   Socioeconomic History   Marital status: Single    Spouse name: Not on file   Number of children: 0   Years of education: Not on file   Highest education level: Bachelor's degree (e.g., BA, AB, BS)  Occupational History   Occupation: unemployed  Tobacco Use   Smoking status: Former    Packs/day: 1.00    Years: 10.00    Pack years: 10.00    Types: Cigarettes    Quit date: 03/2019    Years since quitting: 2.5   Smokeless tobacco: Never  Vaping Use   Vaping Use: Never used  Substance and Sexual Activity   Alcohol use: Not Currently   Drug use: Never   Sexual activity: Not Currently  Other Topics Concern   Not on file  Social History Narrative   Not on file   Social Determinants of Health   Financial Resource Strain: Not on file  Food Insecurity: No Food Insecurity   Worried About Programme researcher, broadcasting/film/video in the Last Year: Never true   Barista in the Last Year: Never true  Transportation Needs: Not on file  Physical Activity: Not on file  Stress: Not on file  Social Connections: Not on file  Intimate Partner Violence: Not on file    Outpatient Medications Prior to Visit  Medication Sig Dispense Refill   ARIPiprazole (ABILIFY) 20 MG tablet Take 1 tablet (20 mg total) by mouth daily. 30 tablet 11   escitalopram (LEXAPRO) 10 MG tablet Take 1  tablet (10 mg total) by mouth daily. 30 tablet 11   escitalopram (LEXAPRO) 5 MG tablet Take 1 tablet (5 mg total) by mouth daily. 30 tablet 11   acetaminophen (TYLENOL) 500 MG tablet Take 1,000 mg by mouth every 8 (eight) hours as needed for moderate pain.     No facility-administered medications prior to visit.    Allergies  Allergen Reactions   Adhesive [Tape] Other (See Comments)    blister   Cherry Anaphylaxis and Rash   Other Hives and Rash   Sulfamethoxazole-Trimethoprim Hives   Levofloxacin     Other reaction(s): Muscle Pain, Other (See Comments)   Morphine     Other reaction(s): Other (See Comments) Feels like unable to breath or swallow    Gabapentin Rash   Metformin Nausea And Vomiting    Other reaction(s): Other (See Comments) diaphoresis    Sumatriptan Nausea And Vomiting    Other reaction(s): Other (See Comments) Decreased heart rate and respiratory rate Decreased heart rate and respiratory rate     ROS Review of Systems    Objective:    Physical Exam Constitutional:      Appearance: She is obese.  HENT:     Head: Normocephalic and atraumatic.     Nose: Nose normal.     Mouth/Throat:     Mouth: Mucous membranes are moist.  Cardiovascular:     Rate and Rhythm: Tachycardia present.     Pulses: Normal pulses.  Pulmonary:     Effort: Pulmonary effort is normal.  Musculoskeletal:        General: Normal range of motion.     Cervical back: Normal range of motion.     Comments: Left heel healed incisional scar   Skin:    General: Skin is warm and dry.     Capillary Refill: Capillary refill takes less than 2 seconds.     Findings: Erythema (to face) present.  Neurological:     General: No focal deficit present.     Mental Status: She is alert and oriented to person, place, and time.  Psychiatric:     Comments: anxious    BP (!) 144/67 (BP Location: Left Arm, Patient Position: Sitting)   Pulse (!) 130   Temp 98.4 F (36.9 C)   Ht 5\' 5"  (1.651  m)   Wt (!) 307 lb (139.3 kg)   LMP 09/06/2021   SpO2 100%   BMI 51.09 kg/m  Wt Readings from Last 3 Encounters:  09/22/21 (!) 307 lb (139.3 kg)  06/05/21 (!) 308 lb 0.6 oz (139.7 kg)  03/06/21 (!) 311 lb (141.1 kg)     Health Maintenance Due  Topic Date Due   INFLUENZA VACCINE  05/15/2021    There are no preventive care reminders  to display for this patient.  Lab Results  Component Value Date   TSH 1.380 03/11/2020   Lab Results  Component Value Date   WBC 8.6 11/17/2020   HGB 14.6 11/17/2020   HCT 41.8 11/17/2020   MCV 92.9 11/17/2020   PLT 241 11/17/2020   Lab Results  Component Value Date   NA 140 11/17/2020   K 3.3 (L) 11/17/2020   CO2 26 11/17/2020   GLUCOSE 96 11/17/2020   BUN 12 11/17/2020   CREATININE 1.07 (H) 11/17/2020   BILITOT 1.1 11/17/2020   ALKPHOS 61 11/17/2020   AST 33 11/17/2020   ALT 38 11/17/2020   PROT 6.5 11/17/2020   ALBUMIN 3.9 11/17/2020   CALCIUM 9.2 11/17/2020   ANIONGAP 11 11/17/2020   No results found for: CHOL No results found for: HDL No results found for: LDLCALC No results found for: TRIG No results found for: CHOLHDL Lab Results  Component Value Date   HGBA1C 4.8 09/14/2020      Assessment & Plan:   Problem List Items Addressed This Visit       Other   Anxiety Persistent  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  Discussed proper diet (low fat, low sodium, high fiber) with patient.   Discussed need for regular exercise (3 times per week, 20 minutes per session) with patient.    Other Visit Diagnoses     Screening for cholesterol level    -  Primary   Relevant Orders   Lipid panel   Major depressive disorder, recurrent severe without psychotic features (HCC)     Persistent Stable on current regimen  No desire to adjust\ Referral to CSW to get assistance with housing   Medication management       Relevant Orders   Comp. Metabolic  Panel (12)   Tachycardia           No orders of the defined types were placed in this encounter.   Follow-up: Return in about 4 weeks (around 10/20/2021) for virtual , Follow-up for anxiety [address].    Barbette Merino, NP

## 2021-09-23 LAB — COMP. METABOLIC PANEL (12)
AST: 16 IU/L (ref 0–40)
Albumin/Globulin Ratio: 1.9 (ref 1.2–2.2)
Albumin: 4.4 g/dL (ref 3.8–4.8)
Alkaline Phosphatase: 98 IU/L (ref 44–121)
BUN/Creatinine Ratio: 11 (ref 9–23)
BUN: 10 mg/dL (ref 6–20)
Bilirubin Total: 0.3 mg/dL (ref 0.0–1.2)
Calcium: 8.9 mg/dL (ref 8.7–10.2)
Chloride: 100 mmol/L (ref 96–106)
Creatinine, Ser: 0.9 mg/dL (ref 0.57–1.00)
Globulin, Total: 2.3 g/dL (ref 1.5–4.5)
Glucose: 87 mg/dL (ref 70–99)
Potassium: 3.8 mmol/L (ref 3.5–5.2)
Sodium: 140 mmol/L (ref 134–144)
Total Protein: 6.7 g/dL (ref 6.0–8.5)
eGFR: 85 mL/min/{1.73_m2} (ref >59)

## 2021-09-23 LAB — LIPID PANEL
Chol/HDL Ratio: 3.5 ratio (ref 0.0–4.4)
Cholesterol, Total: 166 mg/dL (ref 100–199)
HDL: 48 mg/dL (ref >39)
LDL Chol Calc (NIH): 103 mg/dL — ABNORMAL HIGH (ref 0–99)
Triglycerides: 78 mg/dL (ref 0–149)
VLDL Cholesterol Cal: 15 mg/dL (ref 5–40)

## 2021-10-04 ENCOUNTER — Ambulatory Visit: Payer: Medicare Other

## 2021-10-04 ENCOUNTER — Other Ambulatory Visit: Payer: Self-pay

## 2021-10-04 DIAGNOSIS — R293 Abnormal posture: Secondary | ICD-10-CM

## 2021-10-04 DIAGNOSIS — M79672 Pain in left foot: Secondary | ICD-10-CM

## 2021-10-04 DIAGNOSIS — R262 Difficulty in walking, not elsewhere classified: Secondary | ICD-10-CM

## 2021-10-04 DIAGNOSIS — G8929 Other chronic pain: Secondary | ICD-10-CM

## 2021-10-04 DIAGNOSIS — M6281 Muscle weakness (generalized): Secondary | ICD-10-CM

## 2021-10-04 NOTE — Therapy (Signed)
[place], [place], [address] Phone: [phone]   Fax:  [phone]  Physical Therapy Treatment  Patient Details  Name: Rebekah Davis MRN: [id number] Date of Birth: 26-Sep-1986 Referring Provider (PT): Erle Crocker, MD   Encounter Date: 10/04/2021   PT End of Session - 10/04/21 1726     Visit Number 7    Number of Visits 12    Date for PT Re-Evaluation 11/08/21    Authorization Type MCR / MCD    Authorization Time Period FOTO by 6th, KX by 15th    Progress Note Due on Visit 10    PT Start Time 1730    PT Stop Time 1815    PT Time Calculation (min) 45 min    Equipment Utilized During Treatment Other (comment)    Activity Tolerance Patient tolerated treatment well    Behavior During Therapy [facility] for tasks assessed/performed             Past Medical History:  Diagnosis Date   Anxiety    Arthritis    Depression    History of kidney stones    Migraine    PCOS (polycystic ovarian syndrome)    PTSD (post-traumatic stress disorder)     Past Surgical History:  Procedure Laterality Date   TENOLYSIS Left 10/04/2020   Procedure: LEFT ACHILLES TENDON DEBRIDEMENT AND RECONSTRUCTION, CALCANEAL EXOSTECTOMY, GASTROCNEMIUS RECESSION,  FLEXOR HALLUCIS LONGUS TRANSFER;  Surgeon: Erle Crocker, MD;  Location: [facility];  Service: Orthopedics;  Laterality: Left;  LENGTH OF SURGERY: 1.5 HOURS    There were no vitals filed for this visit.   Subjective Assessment - 10/04/21 1729     Subjective Pt presents to PT with reports of L foot/ankle pain. Believes it could be due prolonged standing while shopping the previous week. She is ready to begin PT treatment at this time.    Currently in Pain? Yes    Pain Score 6     Pain Location Foot    Pain Orientation Left           [facility] Adult PT Treatment/Exercise:   Therapeutic Exercise:  NuStep lvl 9 x 5 min while taking subjective Step up 8in x 10  fwd ea Heel-Toe raise x 15 Slant board stretch 2x30" Knee ext 2x10 10lb single leg ea Cybex hip abd 2x10 25lb L ankle PF x 10 25lbs on leg press   Past Interventions Not Performed Today: Ankle 4-way L ankle 2x10 YTB STS 2x10 - no UE support  L calf stretch w/ strap 2x30 sec BAPS CW/CCW x 10 ea L Seated heel raise 2x20 Hamstring curl 2x10 25lb L only    Neuromuscular re-ed: Tandem walk x 2 laps in // SLS 2x30" ea Tandem on foam 2x30" ea                               PT Short Term Goals - 08/30/21 1736       PT SHORT TERM GOAL #1   Title Pt will be independent with her initial HEP to increase ankle flexibility and decrease pain.    Time 4    Period Weeks    Status Achieved    Target Date 08/03/21      PT SHORT TERM GOAL #2   Title Patient will exhibit >/= 5 deg left ankle DF to improve gait with </= 3/10 pain    Time 4  Period Weeks    Status New    Target Date 08/03/21      PT SHORT TERM GOAL #3   Title PT will review FOTO with patient by 3rd visit to understand anticipated progress with PT    Time 3    Period Weeks    Status Achieved    Target Date 07/27/21               PT Long Term Goals - 08/30/21 1755       PT LONG TERM GOAL #1   Title Patient will be I with final HEP to maintain progress from PT    Time 10    Period Weeks    Status New    Target Date 11/08/21      PT LONG TERM GOAL #2   Title Patient will report >/= 57% functional status on FOTO to indicate improved functional ability    Baseline 50% function on 11/16    Time 10    Period Weeks    Status On-going    Target Date 11/08/21      PT LONG TERM GOAL #3   Title Patient will demonstrate left ankle strength grossly 5/5 MMT in order to improve tolerance for standing and walking at work without increase in pain    Time 10    Period Weeks    Status On-going    Target Date 11/08/21      PT LONG TERM GOAL #4   Title Patient will be able to perform SL  balance >/= 20 sec on left to indicate improved ankle coordination to reduce pain with walking    Baseline 8 sec on 11/16 - goal updated to 20 sec    Time 10    Period Weeks    Status New    Target Date 11/08/21      PT LONG TERM GOAL #5   Title Patient will report ability to stand >/= 1 hour without limitation or need to sit to improve working ability    Baseline 30-40 min on 08/30/21    Time 10    Period Weeks    Status On-going    Target Date 11/08/21                   Plan - 10/04/21 1819     Clinical Impression Statement Pt was able to complete all prescribed exercises with no adverse effect or increase in pain. Therapy again focused on improving LE strength and functional stability on L ankle, with continued progression of therapeutic exercises. She continues to benefit from skilled PT services and will continue to be progressed as tolerated.    PT Treatment/Interventions ADLs/Self Care Home Management;Aquatic Therapy;Electrical Stimulation;Cryotherapy;Iontophoresis 4mg /ml Dexamethasone;Moist Heat;Traction;Ultrasound;Neuromuscular re-education;Balance training;Therapeutic exercise;Therapeutic activities;Functional mobility training;Stair training;Gait training;Patient/family education;Manual techniques;Dry needling;Passive range of motion;Taping;Vasopneumatic Device;Spinal Manipulations;Joint Manipulations    PT Next Visit Plan continue to progress with alternating gym and aquatic sessions    PT Home Exercise Plan [id number]             Patient will benefit from skilled therapeutic intervention in order to improve the following deficits and impairments:  Abnormal gait, Decreased range of motion, Difficulty walking, Pain, Decreased activity tolerance, Impaired flexibility, Decreased balance, Decreased strength, Increased edema  Visit Diagnosis: Abnormal posture  Chronic pain of right knee  Difficulty in walking, not elsewhere classified  Pain in left foot  Muscle  weakness (generalized)     Problem List Patient Active Problem List  Diagnosis Date Noted   Passive suicidal ideations 11/16/2020   Irregular bleeding 04/12/2020   Achilles tendinitis 03/11/2020   Ovarian cyst 02/19/2020   Deliberate self-cutting 01/23/2020   Non-suicidal self harm as coping mechanism ([place]) 12/25/2019   Diarrhea 06/13/2018   Hypomagnesemia 06/13/2018   Transaminitis 06/13/2018   C. difficile diarrhea 06/12/2018   Oral thrush 06/12/2018   Suicidal ideation 04/12/2018   Chest pain 03/28/2018   Obesity 03/28/2018   OSA (obstructive sleep apnea) 03/28/2018   Acute cystitis 03/10/2018   GERD (gastroesophageal reflux disease) 03/10/2018   Depression with suicidal ideation 03/09/2018   PTSD (post-traumatic stress disorder) 03/09/2018   MDD (major depressive disorder), recurrent severe, without psychosis ([place]) 02/27/2018   Polycystic disease, ovaries 07/05/2016   Abdominal pain, suprapubic 06/04/2016   Spondylisthesis 04/20/2015   Herniated lumbar intervertebral disc 02/28/2013   Sciatica of left side 02/28/2013   Borderline personality disorder ([place]) 12/29/2012   Anxiety 12/24/2012   Essential hypertension 12/24/2012   Low back pain 07/22/2012    Ward Chatters, PT 10/04/2021, 6:21 PM  [facility] [address] [place], [place], [address] Phone: [phone]   Fax:  [phone]  Name: Rhett Derocher MRN: [id number] Date of Birth: [birth year]

## 2021-10-11 ENCOUNTER — Ambulatory Visit: Payer: Medicare Other

## 2021-10-18 ENCOUNTER — Ambulatory Visit: Payer: Medicare Other | Attending: Orthopaedic Surgery

## 2021-10-18 ENCOUNTER — Other Ambulatory Visit: Payer: Self-pay

## 2021-10-18 DIAGNOSIS — M79672 Pain in left foot: Secondary | ICD-10-CM | POA: Diagnosis present

## 2021-10-18 DIAGNOSIS — R293 Abnormal posture: Secondary | ICD-10-CM | POA: Diagnosis present

## 2021-10-18 DIAGNOSIS — G8929 Other chronic pain: Secondary | ICD-10-CM | POA: Insufficient documentation

## 2021-10-18 DIAGNOSIS — R262 Difficulty in walking, not elsewhere classified: Secondary | ICD-10-CM | POA: Diagnosis present

## 2021-10-18 DIAGNOSIS — M25561 Pain in right knee: Secondary | ICD-10-CM | POA: Insufficient documentation

## 2021-10-18 DIAGNOSIS — M6281 Muscle weakness (generalized): Secondary | ICD-10-CM | POA: Diagnosis present

## 2021-10-18 NOTE — Therapy (Signed)
[place], [place], [address] Phone: [phone]   Fax:  [phone]  Physical Therapy Treatment  Patient Details  Name: Rebekah Davis MRN: [id number] Date of Birth: 1986/10/02 Referring Provider (PT): Rebekah Crocker, MD   Encounter Date: 10/18/2021   PT End of Session - 10/18/21 1749     Visit Number 8    Number of Visits 12    Date for PT Re-Evaluation 11/08/21    Authorization Type MCR / MCD    Authorization Time Period FOTO by 6th, KX by 15th    Progress Note Due on Visit 10    PT Start Time 1743    PT Stop Time 1823    PT Time Calculation (min) 40 min    Equipment Utilized During Treatment Other (comment)    Activity Tolerance Patient tolerated treatment well    Behavior During Therapy [name] for tasks assessed/performed             Past Medical History:  Diagnosis Date   Anxiety    Arthritis    Depression    History of kidney stones    Migraine    PCOS (polycystic ovarian syndrome)    PTSD (post-traumatic stress disorder)     Past Surgical History:  Procedure Laterality Date   TENOLYSIS Left 10/04/2020   Procedure: LEFT ACHILLES TENDON DEBRIDEMENT AND RECONSTRUCTION, CALCANEAL EXOSTECTOMY, GASTROCNEMIUS RECESSION,  FLEXOR HALLUCIS LONGUS TRANSFER;  Surgeon: Rebekah Crocker, MD;  Location: [place];  Service: Orthopedics;  Laterality: Left;  LENGTH OF SURGERY: 1.5 HOURS    There were no vitals filed for this visit.   Subjective Assessment - 10/18/21 1751     Subjective Pt presents to PT with reports significant LBP and discomfort along with continued L foot/ankle pain. Has been compliant with HEP with no adverse effect. Pt is ready to begin PT at this time.    Currently in Pain? Yes    Pain Score 7     Pain Location Back    Pain Orientation Lower    Multiple Pain Sites Yes    Pain Score 3    Pain Location Foot    Pain Orientation Left           [facility] Adult PT  Treatment/Exercise:   Therapeutic Exercise:  NuStep lvl 6 x 4 min while taking subjective LE only Fwd physioball rollout x 10  LTR x 10 Bridge 2x10 - 3" Step up 8in x 10 fwd ea Heel-Toe raise x 20 Slant board stretch 2x45" Cybex hip abd 2x10 25lb L ankle PF x 15 25lbs on leg press   Past Interventions Not Performed Today: Ankle 4-way L ankle 2x10 YTB STS 2x10 - no UE support  L calf stretch w/ strap 2x30 sec BAPS CW/CCW x 10 ea L Seated heel raise 2x20 Hamstring curl 2x10 25lb L only    Neuromuscular re-ed: Tandem walk x 2 laps in // SLS 2x30" ea Tandem on foam 2x30" ea SLS on foam x 30" ea                               PT Short Term Goals - 08/30/21 1736       PT SHORT TERM GOAL #1   Title Pt will be independent with her initial HEP to increase ankle flexibility and decrease pain.    Time 4    Period Weeks    Status  Achieved    Target Date 08/03/21      PT SHORT TERM GOAL #2   Title Patient will exhibit >/= 5 deg left ankle DF to improve gait with </= 3/10 pain    Time 4    Period Weeks    Status New    Target Date 08/03/21      PT SHORT TERM GOAL #3   Title PT will review FOTO with patient by 3rd visit to understand anticipated progress with PT    Time 3    Period Weeks    Status Achieved    Target Date 07/27/21               PT Long Term Goals - 08/30/21 1755       PT LONG TERM GOAL #1   Title Patient will be I with final HEP to maintain progress from PT    Time 10    Period Weeks    Status New    Target Date 11/08/21      PT LONG TERM GOAL #2   Title Patient will report >/= 57% functional status on FOTO to indicate improved functional ability    Baseline 50% function on 11/16    Time 10    Period Weeks    Status On-going    Target Date 11/08/21      PT LONG TERM GOAL #3   Title Patient will demonstrate left ankle strength grossly 5/5 MMT in order to improve tolerance for standing and walking at work without  increase in pain    Time 10    Period Weeks    Status On-going    Target Date 11/08/21      PT LONG TERM GOAL #4   Title Patient will be able to perform SL balance >/= 20 sec on left to indicate improved ankle coordination to reduce pain with walking    Baseline 8 sec on 11/16 - goal updated to 20 sec    Time 10    Period Weeks    Status New    Target Date 11/08/21      PT LONG TERM GOAL #5   Title Patient will report ability to stand >/= 1 hour without limitation or need to sit to improve working ability    Baseline 30-40 min on 08/30/21    Time 10    Period Weeks    Status On-going    Target Date 11/08/21                   Plan - 10/18/21 1806     Clinical Impression Statement Pt was able to complete prescribed exercises with no adverse effect. Continues to progress with therapy, showing improving in L ankle stability and overall LE strength. She continues to benefit from skilled PT services and will progress as tolerated.    PT Treatment/Interventions ADLs/Self Care Home Management;Aquatic Therapy;Electrical Stimulation;Cryotherapy;Iontophoresis 4mg /ml Dexamethasone;Moist Heat;Traction;Ultrasound;Neuromuscular re-education;Balance training;Therapeutic exercise;Therapeutic activities;Functional mobility training;Stair training;Gait training;Patient/family education;Manual techniques;Dry needling;Passive range of motion;Taping;Vasopneumatic Device;Spinal Manipulations;Joint Manipulations    PT Next Visit Plan continue to progress with alternating gym and aquatic sessions    PT Home Exercise Plan [id number]             Patient will benefit from skilled therapeutic intervention in order to improve the following deficits and impairments:  Abnormal gait, Decreased range of motion, Difficulty walking, Pain, Decreased activity tolerance, Impaired flexibility, Decreased balance, Decreased strength, Increased edema  Visit Diagnosis: Abnormal posture  Chronic  pain of right  knee  Difficulty in walking, not elsewhere classified  Pain in left foot  Muscle weakness (generalized)     Problem List Patient Active Problem List   Diagnosis Date Noted   Passive suicidal ideations 11/16/2020   Irregular bleeding 04/12/2020   Achilles tendinitis 03/11/2020   Ovarian cyst 02/19/2020   Deliberate self-cutting 01/23/2020   Non-suicidal self harm as coping mechanism ([place]) 12/25/2019   Diarrhea 06/13/2018   Hypomagnesemia 06/13/2018   Transaminitis 06/13/2018   C. difficile diarrhea 06/12/2018   Oral thrush 06/12/2018   Suicidal ideation 04/12/2018   Chest pain 03/28/2018   Obesity 03/28/2018   OSA (obstructive sleep apnea) 03/28/2018   Acute cystitis 03/10/2018   GERD (gastroesophageal reflux disease) 03/10/2018   Depression with suicidal ideation 03/09/2018   PTSD (post-traumatic stress disorder) 03/09/2018   MDD (major depressive disorder), recurrent severe, without psychosis ([place]) 02/27/2018   Polycystic disease, ovaries 07/05/2016   Abdominal pain, suprapubic 06/04/2016   Spondylisthesis 04/20/2015   Herniated lumbar intervertebral disc 02/28/2013   Sciatica of left side 02/28/2013   Borderline personality disorder ([place]) 12/29/2012   Anxiety 12/24/2012   Essential hypertension 12/24/2012   Low back pain 07/22/2012    Ward Chatters, PT 10/18/2021, 6:26 PM  [facility] [address] [place], [place], [address] Phone: [phone]   Fax:  [phone]  Name: Rebekah Davis MRN: [id number] Date of Birth: 10-Sep-1986

## 2021-10-20 ENCOUNTER — Telehealth: Payer: Self-pay | Admitting: Nurse Practitioner

## 2021-10-25 ENCOUNTER — Ambulatory Visit: Payer: Medicare Other

## 2021-10-25 ENCOUNTER — Ambulatory Visit (HOSPITAL_COMMUNITY)
Admission: EM | Admit: 2021-10-25 | Discharge: 2021-10-25 | Disposition: A | Discharge status: Home / Self Care | Payer: Medicare Other | Attending: Nurse Practitioner | Admitting: Nurse Practitioner

## 2021-10-25 DIAGNOSIS — F332 Major depressive disorder, recurrent severe without psychotic features: Secondary | ICD-10-CM | POA: Insufficient documentation

## 2021-10-25 DIAGNOSIS — Z9152 Personal history of nonsuicidal self-harm: Secondary | ICD-10-CM | POA: Insufficient documentation

## 2021-10-25 DIAGNOSIS — Z79899 Other long term (current) drug therapy: Secondary | ICD-10-CM | POA: Insufficient documentation

## 2021-10-25 DIAGNOSIS — F603 Borderline personality disorder: Secondary | ICD-10-CM | POA: Insufficient documentation

## 2021-10-25 DIAGNOSIS — F4310 Post-traumatic stress disorder, unspecified: Secondary | ICD-10-CM | POA: Diagnosis not present

## 2021-10-25 MED ORDER — HYDROXYZINE PAMOATE 25 MG PO CAPS: 25.0000 mg | ORAL_CAPSULE | Freq: Three times a day (TID) | ORAL | 0 refills | Status: DC | PRN

## 2021-10-25 NOTE — Progress Notes (Signed)
TRIAGE: ROUTINE  Rebekah Romp, NP, reviewed pt's chart and information and met with pt face-to-face and determined pt can be d/c with a prescription for Vistaril.   10/25/21 2035  [place] Triage Screening (Walk-ins at [facility] only)  What Is the Reason for Your Visit/Call Today? Pt shares she has been experiencing increased frequency and intensity of panic attacks; she shares she's had anxiety for decades but that this is worse and it's causig her to feel as if she might unintentionally crash her car while driving or require her to quit her job. Pt denies SI or a hx of SI; she denies any prior attempts to kill herself or a plan to kill herself. Pt states she has been hospitalized over 30 times for mental health concerns, though she states she has not been hospitalized since 2019. Pt denies HI, AVH, access to guns/weapons, or engagement with the legal system. Pt endorses a hx of cutting that started at age 66; the last incident was Saturday, October 21, 2021. Pt shares she has a hx of EtOH abuse, though denies she's drank since 2015/2016.  How Long Has This Been Causing You Problems? 1 wk - 1 month  Have You Recently Had Any Thoughts About Hurting Yourself? No  Are You Planning to Commit Suicide/Harm Yourself At This time? No  Have you Recently Had Thoughts About [place]? No  Are You Planning To Harm Someone At This Time? No  Are you currently experiencing any auditory, visual or other hallucinations? No  Have You Used Any Alcohol or Drugs in the Past 24 Hours? No  Do you have any current medical co-morbidities that require immediate attention? No  Clinician description of patient physical appearance/behavior: Pt is dressed in casual, weather-appropriate attire. She is able to identify her thoughts, feelings, and concerns. Pt answers the questions posed in an appropiate manner.  What Do You Feel Would Help You the Most Today? Medication(s)  If access to [facility] was not available, would you  have sought care in the Emergency Department? Yes  Determination of Need Routine (7 days)  Options For Referral Medication Management;Outpatient Therapy

## 2021-10-25 NOTE — Discharge Instructions (Signed)

## 2021-10-25 NOTE — ED Provider Notes (Signed)
Behavioral Health Urgent Care Medical Screening Exam  Patient Name: Rebekah Davis MRN: [id number] Date of Evaluation: 10/25/21 Chief Complaint:   Diagnosis:  Final diagnoses:  PTSD (post-traumatic stress disorder)  Borderline personality disorder ([place])  MDD (major depressive disorder), recurrent severe, without psychosis ([place])    History of Present illness: Rebekah Davis is a 36 y.o. female with a history of MDD, BPD, and PTSD who presents to [facility] voluntarily due to worsening anxiety. Patient reports that she has been feeling more anxious and does not have any as needed anxiety medications available. 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 She states that she was encouraged to come in for an assessment due to her anxiety. She states that she is currently prescribed Abilify 20 mg daily and lexapro 15 mg daily. She reports a long history of depression and anxiety. She reports a history of multiple inpatient admissions. States that her last inpatient admission was in 2019.   On evaluation patient is alert and oriented x 4, pleasant, and cooperative. Speech is clear and coherent. Mood is depressed/anxious and affect is congruent with mood. Thought process is coherent and thought content is logical. Denies auditory and visual hallucinations. No indication that patient is responding to internal stimuli. No evidence of delusional thought content. Denies suicidal ideations. Denies homicidal ideations. Denies substance abuse.     Triage Note:  Pt shares she has been experiencing increased frequency and intensity of panic attacks; she shares she's had anxiety for decades but that this is worse and it's causig her to feel as if she might unintentionally crash her car while driving or require her to quit her job. Pt denies SI or a hx of SI; she denies any prior attempts to kill herself or a  plan to kill herself. Pt states she has been hospitalized over 30 times for mental health concerns, though she states she has not been hospitalized since 2019. Pt denies HI, AVH, access to guns/weapons, or engagement with the legal system. Pt endorses a hx of cutting that started at age 68; the last incident was Saturday, October 21, 2021. Pt shares she has a hx of EtOH abuse, though denies she's drank since 2015/2016.   Psychiatric Specialty Exam  Presentation  General Appearance:Appropriate for Environment; Casual  Eye Contact:Good  Speech:Clear and Coherent; Normal Rate  Speech Volume:Normal  Handedness:Right   Mood and Affect  Mood:Anxious; Depressed  Affect:Congruent   Thought Process  Thought Processes:Coherent; Goal Directed; Linear  Descriptions of Associations:Intact  Orientation:Full (Time, Place and Person)  Thought Content:WDL  Diagnosis of Schizophrenia or Schizoaffective disorder in past: No   Hallucinations:None  Ideas of Reference:None  Suicidal Thoughts:No Without Intent; With Plan; With Means to Carry Out (States she has thought about overdosing or cutting herself but doesn't feel that she woud do it.  States she would reach out to someone before she would try to hurt herself)  Homicidal Thoughts:No   Sensorium  Memory:Immediate Good; Recent Good  Judgment:Intact  Insight:Present   Executive Functions  Concentration:Good  Attention Span:Good  [place] of Knowledge:Good  Language:Good   Psychomotor Activity  Psychomotor Activity:Normal   Assets  Assets:Communication Skills; Desire for Improvement; Financial Resources/Insurance; Housing; Physical Health; Transportation; Social Support   Sleep  Sleep:Fair  Number of hours: No data recorded  No data recorded  Physical Exam: Physical Exam Constitutional:  General: She is not in acute distress.    Appearance: She is not ill-appearing, toxic-appearing or  diaphoretic.  HENT:     Head: Normocephalic.     Right Ear: External ear normal.     Left Ear: External ear normal.  Eyes:     Conjunctiva/sclera: Conjunctivae normal.     Pupils: Pupils are equal, round, and reactive to light.  Cardiovascular:     Rate and Rhythm: Normal rate.  Pulmonary:     Effort: Pulmonary effort is normal. No respiratory distress.  Musculoskeletal:        General: Normal range of motion.  Skin:    General: Skin is warm and dry.  Neurological:     Mental Status: She is alert and oriented to person, place, and time.  Psychiatric:        Mood and Affect: Mood is anxious and depressed.        Thought Content: Thought content is not paranoid or delusional. Thought content does not include homicidal or suicidal ideation.   Review of Systems  Constitutional:  Negative for chills, diaphoresis, fever, malaise/fatigue and weight loss.  HENT:  Negative for congestion.   Respiratory:  Negative for cough and shortness of breath.   Cardiovascular:  Negative for chest pain and palpitations.  Gastrointestinal:  Negative for diarrhea, nausea and vomiting.  Neurological:  Negative for dizziness and seizures.  Psychiatric/Behavioral:  Positive for depression. Negative for hallucinations, memory loss, substance abuse and suicidal ideas. The patient is nervous/anxious and has insomnia.   All other systems reviewed and are negative.  Blood pressure (!) 159/99, pulse (!) 114, temperature 99.1 F (37.3 C), temperature source Oral, resp. rate 16, SpO2 100 %. There is no height or weight on file to calculate BMI.  Musculoskeletal: Strength & Muscle Tone: within normal limits Gait & Station: normal Patient leans: N/A  Recent Results (from the past 2160 hour(s))  Comp. Metabolic Panel (12)     Status: None   Collection Time: 09/22/21  3:09 PM  Result Value Ref Range   Glucose 87 70 - 99 mg/dL   BUN 10 6 - 20 mg/dL   Creatinine, Ser 0.90 0.57 - 1.00 mg/dL   eGFR 85 >59  mL/min/1.73   BUN/Creatinine Ratio 11 9 - 23   Sodium 140 134 - 144 mmol/L   Potassium 3.8 3.5 - 5.2 mmol/L   Chloride 100 96 - 106 mmol/L   Calcium 8.9 8.7 - 10.2 mg/dL   Total Protein 6.7 6.0 - 8.5 g/dL   Albumin 4.4 3.8 - 4.8 g/dL   Globulin, Total 2.3 1.5 - 4.5 g/dL   Albumin/Globulin Ratio 1.9 1.2 - 2.2   Bilirubin Total 0.3 0.0 - 1.2 mg/dL   Alkaline Phosphatase 98 44 - 121 IU/L   AST 16 0 - 40 IU/L  Lipid panel     Status: Abnormal   Collection Time: 09/22/21  3:09 PM  Result Value Ref Range   Cholesterol, Total 166 100 - 199 mg/dL   Triglycerides 78 0 - 149 mg/dL   HDL 48 >39 mg/dL   VLDL Cholesterol Cal 15 5 - 40 mg/dL   LDL Chol Calc (NIH) 103 (H) 0 - 99 mg/dL   Chol/HDL Ratio 3.5 0.0 - 4.4 ratio    Comment:                                   T. Chol/HDL Ratio  Men  Women                               1/2 Avg.Risk  3.4    3.3                                   Avg.Risk  5.0    4.4                                2X Avg.Risk  9.6    7.1                                3X Avg.Risk 23.4   11.0     Current Outpatient Medications  Medication Instructions   ARIPiprazole (ABILIFY) 20 mg, Oral, Daily   escitalopram (LEXAPRO) 5 mg, Oral, Daily   escitalopram (LEXAPRO) 10 mg, Oral, Daily   hydrOXYzine (VISTARIL) 25 mg, Oral, 3 times daily PRN      [place] [facility] Discharge Disposition for Follow up and Recommendations: Based on my evaluation the patient does not appear to have an emergency medical condition and can be discharged with resources and follow up care in outpatient services for Medication Management and Individual Therapy  Discussed benefits, risks and side effect profile of hydroxyzine with the patient. They verbalized willingness to take the medication/s as prescribed. Counseled the patient on the importance of medication compliance and to not discontinue medications without medical advice as withdrawal symptoms and/ or worsening  of symptoms may occur.     Meds ordered this encounter  Medications   hydrOXYzine (VISTARIL) 25 MG capsule    Sig: Take 1 capsule (25 mg total) by mouth 3 (three) times daily as needed for anxiety.    Dispense:  60 capsule    Refill:  0    Order Specific Question:   Supervising Provider    Answer:   Hampton Abbot [[id number]]      Rozetta Nunnery, NP 10/25/2021, 9:21 PM

## 2021-10-26 ENCOUNTER — Encounter: Payer: Self-pay | Admitting: Nurse Practitioner

## 2021-10-26 ENCOUNTER — Other Ambulatory Visit: Payer: Self-pay

## 2021-10-26 ENCOUNTER — Telehealth (INDEPENDENT_AMBULATORY_CARE_PROVIDER_SITE_OTHER): Payer: Medicare Other | Admitting: Nurse Practitioner

## 2021-10-26 DIAGNOSIS — F419 Anxiety disorder, unspecified: Secondary | ICD-10-CM

## 2021-10-26 DIAGNOSIS — F332 Major depressive disorder, recurrent severe without psychotic features: Secondary | ICD-10-CM

## 2021-10-26 NOTE — Progress Notes (Signed)
Virtual Visit via telephone Note  I connected with Rebekah Davis on 10/26/21 at  3:40 PM EST by telephone and verified that I am speaking with the correct person using two identifiers.   I discussed the limitations, risks, security and privacy concerns of performing an evaluation and management service by video and the availability of in person appointments. I also discussed with the patient that there may be a patient responsible charge related to this service. The patient expressed understanding and agreed to proceed.  Patient work Scientist, research (life sciences)  History of Present Illness:  She  has a past medical history of Anxiety, Arthritis, Depression, History of kidney stones, Migraine, PCOS (polycystic ovarian syndrome), and PTSD (post-traumatic stress disorder).   HPI  She is following for ED Visit on yesterday.  She was evaluated for her increasing anxiety.  She is concerned about her anxiety due to her heart rate increasing to greater than 120.  She feels like she is having more panic attacks.  She has requested Vistaril on several occasions however this is prohibited at her current residence at the [facility] house.  She is actively looking for new residency at the [facility].  She reports that she has to show the over her discharge instructions and if she has any medication on her person that is prohibited by the facility she will be evicted.  She feels like this may also be causing her anxiety to worsen.  She was prescribed Vistaril 25 mg by the emergency department.  She has not had this medication field and reports that she will not feel until she has secure housing.  ROS She denies any suicidal thoughts or ideation   Observations/Objective: Virtual visit no exam   Assessment and Plan: 1. Major depressive disorder, recurrent severe without psychotic features (HCC) Persistent however stable on current regimen of Abilify and Lexapro  2. Anxiety Worsening with increased panic attacks History  of present emergency department had has not been obtained by patient at this time   Follow Up Instructions: No follow-ups on file.    I discussed the assessment and treatment plan with the patient. The patient was provided an opportunity to ask questions and all were answered. The patient agreed with the plan and demonstrated an understanding of the instructions.   The patient was advised to call back or seek an in-person evaluation if the symptoms worsen or if the condition fails to improve as anticipated.  I provided 15 minutes of telephone- visit time during this encounter.   Barbette Merino, NP

## 2021-10-26 NOTE — Patient Instructions (Signed)
Managing Anxiety, Adult ?After being diagnosed with anxiety, you may be relieved to know why you have felt or behaved a certain way. You may also feel overwhelmed about the treatment ahead and what it will mean for your life. With care and support, you can manage this condition. ?How to manage lifestyle changes ?Managing stress and anxiety ?Stress is your body's reaction to life changes and events, both good and bad. Most stress will last just a few hours, but stress can be ongoing and can lead to more than just stress. Although stress can play a major role in anxiety, it is not the same as anxiety. Stress is usually caused by something external, such as a deadline, test, or competition. Stress normally passes after the triggering event has ended.  ?Anxiety is caused by something internal, such as imagining a terrible outcome or worrying that something will go wrong that will devastate you. Anxiety often does not go away even after the triggering event is over, and it can become long-term (chronic) worry. It is important to understand the differences between stress and anxiety and to manage your stress effectively so that it does not lead to an anxious response. ?Talk with your health care provider or a counselor to learn more about reducing anxiety and stress. He or she may suggest tension reduction techniques, such as: ?Music therapy. Spend time creating or listening to music that you enjoy and that inspires you. ?Mindfulness-based meditation. Practice being aware of your normal breaths while not trying to control your breathing. It can be done while sitting or walking. ?Centering prayer. This involves focusing on a word, phrase, or sacred image that means something to you and brings you peace. ?Deep breathing. To do this, expand your stomach and inhale slowly through your nose. Hold your breath for 3-5 seconds. Then exhale slowly, letting your stomach muscles relax. ?Self-talk. Learn to notice and identify  thought patterns that lead to anxiety reactions and change those patterns to thoughts that feel peaceful. ?Muscle relaxation. Taking time to tense muscles and then relax them. ?Choose a tension reduction technique that fits your lifestyle and personality. These techniques take time and practice. Set aside 5-15 minutes a day to do them. Therapists can offer counseling and training in these techniques. The training to help with anxiety may be covered by some insurance plans. ?Other things you can do to manage stress and anxiety include: ?Keeping a stress diary. This can help you learn what triggers your reaction and then learn ways to manage your response. ?Thinking about how you react to certain situations. You may not be able to control everything, but you can control your response. ?Making time for activities that help you relax and not feeling guilty about spending your time in this way. ?Doing visual imagery. This involves imagining or creating mental pictures to help you relax. ?Practicing yoga. Through yoga poses, you can lower tension and promote relaxation. ? ?Medicines ?Medicines can help ease symptoms. Medicines for anxiety include: ?Antidepressant medicines. These are usually prescribed for long-term daily control. ?Anti-anxiety medicines. These may be added in severe cases, especially when panic attacks occur. ?Medicines will be prescribed by a health care provider. When used together, medicines, psychotherapy, and tension reduction techniques may be the most effective treatment. ?Relationships ?Relationships can play a big part in helping you recover. Try to spend more time connecting with trusted friends and family members. ?Consider going to couples counseling if you have a partner, taking family education classes, or going to family   therapy. ?Therapy can help you and others better understand your condition. ?How to recognize changes in your anxiety ?Everyone responds differently to treatment for  anxiety. Recovery from anxiety happens when symptoms decrease and stop interfering with your daily activities at home or work. This may mean that you will start to: ?Have better concentration and focus. Worry will interfere less in your daily thinking. ?Sleep better. ?Be less irritable. ?Have more energy. ?Have improved memory. ?It is also important to recognize when your condition is getting worse. Contact your health care provider if your symptoms interfere with home or work and you feel like your condition is not improving. ?Follow these instructions at home: ?Activity ?Exercise. Adults should do the following: ?Exercise for at least 150 minutes each week. The exercise should increase your heart rate and make you sweat (moderate-intensity exercise). ?Strengthening exercises at least twice a week. ?Get the right amount and quality of sleep. Most adults need 7-9 hours of sleep each night. ?Lifestyle ? ?Eat a healthy diet that includes plenty of vegetables, fruits, whole grains, low-fat dairy products, and lean protein. ?Do not eat a lot of foods that are high in fats, added sugars, or salt (sodium). ?Make choices that simplify your life. ?Do not use any products that contain nicotine or tobacco. These products include cigarettes, chewing tobacco, and vaping devices, such as e-cigarettes. If you need help quitting, ask your health care provider. ?Avoid caffeine, alcohol, and certain over-the-counter cold medicines. These may make you feel worse. Ask your pharmacist which medicines to avoid. ?General instructions ?Take over-the-counter and prescription medicines only as told by your health care provider. ?Keep all follow-up visits. This is important. ?Where to find support ?You can get help and support from these sources: ?Self-help groups. ?Online and community organizations. ?A trusted spiritual leader. ?Couples counseling. ?Family education classes. ?Family therapy. ?Where to find more information ?You may find  that joining a support group helps you deal with your anxiety. The following sources can help you locate counselors or support groups near you: ?Mental Health America: www.mentalhealthamerica.net ?Anxiety and Depression Association of America (ADAA): www.adaa.org ?National Alliance on Mental Illness (NAMI): www.nami.org ?Contact a health care provider if: ?You have a hard time staying focused or finishing daily tasks. ?You spend many hours a day feeling worried about everyday life. ?You become exhausted by worry. ?You start to have headaches or frequently feel tense. ?You develop chronic nausea or diarrhea. ?Get help right away if: ?You have a racing heart and shortness of breath. ?You have thoughts of hurting yourself or others. ?If you ever feel like you may hurt yourself or others, or have thoughts about taking your own life, get help right away. Go to your nearest emergency department or: ?Call your local emergency services (911 in the [place] ?Call a suicide crisis helpline, such as the National Suicide Prevention Lifeline at 1-800-273-8255 or 988 in the [place] This is open 24 hours a day in the [place] ?Text the Crisis Text Line at 741741 (in the [place] ?Summary ?Taking steps to learn and use tension reduction techniques can help calm you and help prevent triggering an anxiety reaction. ?When used together, medicines, psychotherapy, and tension reduction techniques may be the most effective treatment. ?Family, friends, and partners can play a big part in supporting you. ?This information is not intended to replace advice given to you by your health care provider. Make sure you discuss any questions you have with your health care provider. ?Document Revised: 04/26/2021 Document Reviewed: 01/22/2021 ?Elsevier Patient   Education ? 2022 Elsevier Inc. ? ?

## 2021-10-26 NOTE — Progress Notes (Signed)
Patient states anxiety is getting worst, states she went to the behavioral hospital last night where they gave her medication.

## 2021-11-01 ENCOUNTER — Other Ambulatory Visit: Payer: Self-pay

## 2021-11-01 ENCOUNTER — Ambulatory Visit: Payer: Medicare Other

## 2021-11-01 DIAGNOSIS — M79672 Pain in left foot: Secondary | ICD-10-CM

## 2021-11-01 DIAGNOSIS — R262 Difficulty in walking, not elsewhere classified: Secondary | ICD-10-CM

## 2021-11-01 DIAGNOSIS — R293 Abnormal posture: Secondary | ICD-10-CM | POA: Diagnosis not present

## 2021-11-01 DIAGNOSIS — G8929 Other chronic pain: Secondary | ICD-10-CM

## 2021-11-01 NOTE — Therapy (Signed)
[place], [place], [address] Phone: [phone]   Fax:  [phone]  Physical Therapy Treatment/Discharge  Patient Details  Name: Rebekah Davis MRN: [id number] Date of Birth: 27-Aug-1986 Referring Provider (PT): Erle Crocker, MD   Encounter Date: 11/01/2021   PT End of Session - 11/01/21 1826     Visit Number 9    Number of Visits 12    Date for PT Re-Evaluation 11/08/21    Authorization Type MCR / MCD    Authorization Time Period FOTO by 6th, KX by 15th    Progress Note Due on Visit 10    PT Start Time 1745    PT Stop Time 1823    PT Time Calculation (min) 38 min    Equipment Utilized During Treatment --    Activity Tolerance Patient tolerated treatment well    Behavior During Therapy [facility] for tasks assessed/performed             Past Medical History:  Diagnosis Date   Anxiety    Arthritis    Depression    History of kidney stones    Migraine    PCOS (polycystic ovarian syndrome)    PTSD (post-traumatic stress disorder)     Past Surgical History:  Procedure Laterality Date   TENOLYSIS Left 10/04/2020   Procedure: LEFT ACHILLES TENDON DEBRIDEMENT AND RECONSTRUCTION, CALCANEAL EXOSTECTOMY, GASTROCNEMIUS RECESSION,  FLEXOR HALLUCIS LONGUS TRANSFER;  Surgeon: Erle Crocker, MD;  Location: [place];  Service: Orthopedics;  Laterality: Left;  LENGTH OF SURGERY: 1.5 HOURS    There were no vitals filed for this visit.   Subjective Assessment - 11/01/21 1745     Subjective Pt presents to PT with reports of only slight ankle pain and discomfort. She says overall her ankle is doing much better and she is pleased with her progress. She is ready to begin PT treatment at this time.    Currently in Pain? Yes    Pain Score 1     Pain Location Ankle    Pain Orientation Left           [facility] Adult PT Treatment/Exercise:   Therapeutic Exercise:  L ankle DF/ev/inv x 15 ea GTB L ankle  PF x 15 black TB SLS x 30" L L calf stretch w/ strap 2x30" Standing hip abd/ext x 10 RTB  Therapeutic Activity: Assessment of tests/measures, goals, and outcomes    [facility] PT Assessment - 11/01/21 0001       Observation/Other Assessments   Focus on Therapeutic Outcomes (FOTO)  59% function      Single Leg Stance   Comments 20" L LE      AROM   Left Ankle Dorsiflexion 8      Strength   Left Ankle Dorsiflexion 5/5    Left Ankle Plantar Flexion 5/5    Left Ankle Inversion 5/5    Left Ankle Eversion 5/5                                    PT Education - 11/01/21 1826     Education Details final HEP and discharge plan    Person(s) Educated Patient    Methods Explanation;Demonstration;Handout    Comprehension Verbalized understanding;Returned demonstration              PT Short Term Goals - 11/01/21 1827       PT SHORT  TERM GOAL #1   Title Pt will be independent with her initial HEP to increase ankle flexibility and decrease pain.    Time 4    Period Weeks    Status Achieved    Target Date 08/03/21      PT SHORT TERM GOAL #2   Title Patient will exhibit >/= 5 deg left ankle DF to improve gait with </= 3/10 pain    Time 4    Period Weeks    Status Achieved    Target Date 08/03/21      PT SHORT TERM GOAL #3   Title PT will review FOTO with patient by 3rd visit to understand anticipated progress with PT    Time 3    Period Weeks    Status Achieved    Target Date 07/27/21               PT Long Term Goals - 11/01/21 1754       PT LONG TERM GOAL #1   Title Patient will be I with final HEP to maintain progress from PT    Time 10    Period Weeks    Status Achieved    Target Date 11/08/21      PT LONG TERM GOAL #2   Title Patient will report >/= 57% functional status on FOTO to indicate improved functional ability    Baseline 50% function on 11/16    Time 10    Period Weeks    Status Achieved    Target Date 11/08/21       PT LONG TERM GOAL #3   Title Patient will demonstrate left ankle strength grossly 5/5 MMT in order to improve tolerance for standing and walking at work without increase in pain    Time 10    Period Weeks    Status Achieved    Target Date 11/08/21      PT LONG TERM GOAL #4   Title Patient will be able to perform SL balance >/= 20 sec on left to indicate improved ankle coordination to reduce pain with walking    Baseline 8 sec on 11/16 - goal updated to 20 sec    Time 10    Period Weeks    Status Achieved    Target Date 11/08/21      PT LONG TERM GOAL #5   Title Patient will report ability to stand >/= 1 hour without limitation or need to sit to improve working ability    Baseline 30-40 min on 08/30/21    Time 10    Period Weeks    Status Achieved    Target Date 11/08/21                   Plan - 11/01/21 1834     Clinical Impression Statement Pt was able to complete all prescribed exercises and demonstrated of HEP with no adverse effect. Over the course of PT treatment, she has greatly improved strength and balance for her L anlke, as noted by improvement in MMT, SLS, and ROM. She has met her FOTO score, indicating subjective improvement in functional ability. She has met all LTGs and is pleased with overall progress with therapy. She should continue to improve with HEP compliance and is being discharged from skilled PT at this time.    PT Treatment/Interventions ADLs/Self Care Home Management;Aquatic Therapy;Electrical Stimulation;Cryotherapy;Iontophoresis 33m/ml Dexamethasone;Moist Heat;Traction;Ultrasound;Neuromuscular re-education;Balance training;Therapeutic exercise;Therapeutic activities;Functional mobility training;Stair training;Gait training;Patient/family education;Manual techniques;Dry needling;Passive range of motion;Taping;Vasopneumatic Device;Spinal Manipulations;Joint  Manipulations    PT Home Exercise Plan [id number]    Consulted and Agree with Plan of Care  Patient             Patient will benefit from skilled therapeutic intervention in order to improve the following deficits and impairments:  Abnormal gait, Decreased range of motion, Difficulty walking, Pain, Decreased activity tolerance, Impaired flexibility, Decreased balance, Decreased strength, Increased edema  Visit Diagnosis: Abnormal posture  Chronic pain of right knee  Difficulty in walking, not elsewhere classified  Pain in left foot     Problem List Patient Active Problem List   Diagnosis Date Noted   Passive suicidal ideations 11/16/2020   Irregular bleeding 04/12/2020   Achilles tendinitis 03/11/2020   Ovarian cyst 02/19/2020   Deliberate self-cutting 01/23/2020   Non-suicidal self harm as coping mechanism ([place]) 12/25/2019   Diarrhea 06/13/2018   Hypomagnesemia 06/13/2018   Transaminitis 06/13/2018   C. difficile diarrhea 06/12/2018   Oral thrush 06/12/2018   Suicidal ideation 04/12/2018   Chest pain 03/28/2018   Obesity 03/28/2018   OSA (obstructive sleep apnea) 03/28/2018   Acute cystitis 03/10/2018   GERD (gastroesophageal reflux disease) 03/10/2018   Depression with suicidal ideation 03/09/2018   PTSD (post-traumatic stress disorder) 03/09/2018   MDD (major depressive disorder), recurrent severe, without psychosis ([place]) 02/27/2018   Polycystic disease, ovaries 07/05/2016   Abdominal pain, suprapubic 06/04/2016   Spondylisthesis 04/20/2015   Herniated lumbar intervertebral disc 02/28/2013   Sciatica of left side 02/28/2013   Borderline personality disorder ([place]) 12/29/2012   Anxiety 12/24/2012   Essential hypertension 12/24/2012   Low back pain 07/22/2012    Ward Chatters, PT 11/01/2021, 6:41 PM  [facility] [address] [place], [place], [address] Phone: [phone]   Fax:  [phone]  Name: Rebekah Davis MRN: [id number] Date of Birth: 1986-08-06  PHYSICAL THERAPY DISCHARGE  SUMMARY  Visits from Start of Care: 9  Current functional level related to goals / functional outcomes: See goals and objective   Remaining deficits: See goals and objective   Education / Equipment:  HEP   Patient agrees to discharge. Patient goals were met. Patient is being discharged due to meeting the stated rehab goals.

## 2021-11-06 ENCOUNTER — Telehealth (HOSPITAL_COMMUNITY): Payer: Self-pay | Admitting: Nurse Practitioner

## 2021-11-06 NOTE — BH Assessment (Signed)
Care Management - [facility] Follow Up Discharges   Writer attempted to make contact with patient today and was unsuccessful.  Writer left a HIPPA compliant voice message.   Per chart review, patient was provided with substance abuse outpatient resources.  

## 2021-11-13 ENCOUNTER — Ambulatory Visit (INDEPENDENT_AMBULATORY_CARE_PROVIDER_SITE_OTHER): Payer: Medicare Other | Admitting: Nurse Practitioner

## 2021-11-13 ENCOUNTER — Encounter: Payer: Self-pay | Admitting: Nurse Practitioner

## 2021-11-13 ENCOUNTER — Other Ambulatory Visit: Payer: Self-pay

## 2021-11-13 VITALS — BP 133/92 | HR 132 | Temp 98.1°F | Ht 65.0 in | Wt 316.0 lb

## 2021-11-13 DIAGNOSIS — F419 Anxiety disorder, unspecified: Secondary | ICD-10-CM | POA: Diagnosis not present

## 2021-11-13 DIAGNOSIS — R42 Dizziness and giddiness: Secondary | ICD-10-CM

## 2021-11-13 MED ORDER — MECLIZINE HCL 25 MG PO TABS: 25.0000 mg | ORAL_TABLET | Freq: Three times a day (TID) | ORAL | 0 refills | Status: DC | PRN

## 2021-11-13 NOTE — Progress Notes (Signed)
[place], [place]  [address] Phone:  [phone]   Fax:  [phone] Subjective:   Patient ID: Rebekah Davis, female    DOB: 09-24-1986, 36 y.o.   MRN: [id number]  Chief Complaint  Patient presents with   Follow-up    Pt has been having dizziness for a month pt has been feeling very anxious    HPI Deairra Halleck 36 y.o. female  has a past medical history of Anxiety, Arthritis, Depression, History of kidney stones, Migraine, PCOS (polycystic ovarian syndrome), and PTSD (post-traumatic stress disorder). To the [facility] for dizziness and anxiety.  Endorses having dizziness intermittently over the past 2 wks, worsening over the past 2 days. Symptoms began again today, triggering her anxiety. She was informed from employer to the come to the PCP for further evaluation of symptoms. Dizziness occurs with sitting, initially suspected it was related to wearing glasses, but when she stopped wearing glasses, dizziness persisted. Uncertain if symptoms related to anxiety, currently compliant with all medications. Unable to describe dizziness, states, "it feels like I am falling through an elevator." Denies any nausea and vomiting. States that she has had vertigo in the past, suspects it could be related to today's symptoms. Denies any recent trauma or injury.  Denies any fatigue, chest pain, shortness of breath, HA or dizziness. Denies any blurred vision, numbness or tingling.  Past Medical History:  Diagnosis Date   Anxiety    Arthritis    Depression    History of kidney stones    Migraine    PCOS (polycystic ovarian syndrome)    PTSD (post-traumatic stress disorder)     Past Surgical History:  Procedure Laterality Date   TENOLYSIS Left 10/04/2020   Procedure: LEFT ACHILLES TENDON DEBRIDEMENT AND RECONSTRUCTION, CALCANEAL EXOSTECTOMY, GASTROCNEMIUS RECESSION,  FLEXOR HALLUCIS LONGUS TRANSFER;  Surgeon: Erle Crocker, MD;  Location: [place];   Service: Orthopedics;  Laterality: Left;  LENGTH OF SURGERY: 1.5 HOURS    Family History  Problem Relation Age of Onset   Hypertension Mother    Diabetes Mother    Kidney disease Mother    Depression Mother    Hypertension Father    Diabetes Father     Social History   Socioeconomic History   Marital status: Single    Spouse name: Not on file   Number of children: 0   Years of education: Not on file   Highest education level: Bachelor's degree (e.g., BA, AB, BS)  Occupational History   Occupation: unemployed  Tobacco Use   Smoking status: Former    Packs/day: 1.00    Years: 10.00    Pack years: 10.00    Types: Cigarettes    Quit date: 03/2019    Years since quitting: 2.6   Smokeless tobacco: Never  Vaping Use   Vaping Use: Never used  Substance and Sexual Activity   Alcohol use: Not Currently   Drug use: Never   Sexual activity: Not Currently  Other Topics Concern   Not on file  Social History Narrative   Not on file   Social Determinants of Health   Financial Resource Strain: Not on file  Food Insecurity: No Food Insecurity   Worried About Running Out of Food in the Last Year: Never true   Ran Out of Food in the Last Year: Never true  Transportation Needs: Not on file  Physical Activity: Not on file  Stress: Not on file  Social Connections: Not on  file  Intimate Partner Violence: Not on file    Outpatient Medications Prior to Visit  Medication Sig Dispense Refill   ARIPiprazole (ABILIFY) 20 MG tablet Take 1 tablet (20 mg total) by mouth daily. 30 tablet 11   escitalopram (LEXAPRO) 10 MG tablet Take 1 tablet (10 mg total) by mouth daily. 30 tablet 11   escitalopram (LEXAPRO) 5 MG tablet Take 1 tablet (5 mg total) by mouth daily. 30 tablet 11   hydrOXYzine (VISTARIL) 25 MG capsule Take 1 capsule (25 mg total) by mouth 3 (three) times daily as needed for anxiety. 60 capsule 0   No facility-administered medications prior to visit.    Allergies  Allergen  Reactions   Adhesive [Tape] Other (See Comments)    blister   Cherry Anaphylaxis and Rash   Other Hives and Rash   Sulfamethoxazole-Trimethoprim Hives   Levofloxacin     Other reaction(s): Muscle Pain, Other (See Comments)   Morphine     Other reaction(s): Other (See Comments) Feels like unable to breath or swallow    Gabapentin Rash   Metformin Nausea And Vomiting    Other reaction(s): Other (See Comments) diaphoresis    Sumatriptan Nausea And Vomiting    Other reaction(s): Other (See Comments) Decreased heart rate and respiratory rate Decreased heart rate and respiratory rate     Review of Systems  Constitutional:  Negative for chills, fever and malaise/fatigue.  HENT: Negative.    Eyes: Negative.   Respiratory:  Negative for cough and shortness of breath.   Cardiovascular:  Negative for chest pain, palpitations and leg swelling.  Gastrointestinal:  Negative for abdominal pain, blood in stool, constipation, diarrhea, nausea and vomiting.  Skin: Negative.   Neurological:  Positive for dizziness.  Psychiatric/Behavioral:  Negative for depression. The patient is nervous/anxious.   All other systems reviewed and are negative.     Objective:    Physical Exam Vitals reviewed.  Constitutional:      General: She is not in acute distress.    Appearance: Normal appearance.  HENT:     Head: Normocephalic.     Right Ear: Tympanic membrane, ear canal and external ear normal. There is no impacted cerumen.     Left Ear: Tympanic membrane, ear canal and external ear normal. There is no impacted cerumen.     Nose: Nose normal. No congestion or rhinorrhea.     Mouth/Throat:     Mouth: Mucous membranes are moist.     Pharynx: Oropharynx is clear. No oropharyngeal exudate or posterior oropharyngeal erythema.  Eyes:     General: No scleral icterus.       Right eye: No discharge.        Left eye: No discharge.     Extraocular Movements: Extraocular movements intact.      Conjunctiva/sclera: Conjunctivae normal.     Pupils: Pupils are equal, round, and reactive to light.  Cardiovascular:     Rate and Rhythm: Normal rate and regular rhythm.     Pulses: Normal pulses.     Heart sounds: Normal heart sounds.     Comments: No obvious peripheral edema Pulmonary:     Effort: Pulmonary effort is normal.     Breath sounds: Normal breath sounds.  Skin:    General: Skin is warm and dry.     Capillary Refill: Capillary refill takes less than 2 seconds.  Neurological:     General: No focal deficit present.     Mental Status: She is alert and  oriented to person, place, and time.  Psychiatric:        Mood and Affect: Mood normal.        Behavior: Behavior normal.        Thought Content: Thought content normal.        Judgment: Judgment normal.    BP (!) 133/92 (BP Location: Right Arm, Patient Position: Sitting)    Pulse (!) 132    Temp 98.1 F (36.7 C)    Ht _0  (1.651 m)    Wt (!) 316 lb (143.3 kg)    LMP 10/28/2021    SpO2 100%    BMI 52.59 kg/m  Wt Readings from Last 3 Encounters:  11/13/21 (!) 316 lb (143.3 kg)  09/22/21 (!) 307 lb (139.3 kg)  06/05/21 (!) 308 lb 0.6 oz (139.7 kg)    Immunization History  Administered Date(s) Administered   Influenza-Unspecified 07/25/2013   Tdap 04/22/2013, 01/21/2020    Diabetic Foot Exam - Simple   No data filed     Lab Results  Component Value Date   TSH 1.380 03/11/2020   Lab Results  Component Value Date   WBC 8.6 11/17/2020   HGB 14.6 11/17/2020   HCT 41.8 11/17/2020   MCV 92.9 11/17/2020   PLT 241 11/17/2020   Lab Results  Component Value Date   NA 140 09/22/2021   K 3.8 09/22/2021   CO2 26 11/17/2020   GLUCOSE 87 09/22/2021   BUN 10 09/22/2021   CREATININE 0.90 09/22/2021   BILITOT 0.3 09/22/2021   ALKPHOS 98 09/22/2021   AST 16 09/22/2021   ALT 38 11/17/2020   PROT 6.7 09/22/2021   ALBUMIN 4.4 09/22/2021   CALCIUM 8.9 09/22/2021   ANIONGAP 11 11/17/2020   EGFR 85 09/22/2021    Lab Results  Component Value Date   CHOL 166 09/22/2021   Lab Results  Component Value Date   HDL 48 09/22/2021   Lab Results  Component Value Date   LDLCALC 103 (H) 09/22/2021   Lab Results  Component Value Date   TRIG 78 09/22/2021   Lab Results  Component Value Date   CHOLHDL 3.5 09/22/2021   Lab Results  Component Value Date   HGBA1C 4.8 09/14/2020       Assessment & Plan:   Problem List Items Addressed This Visit       Other   Anxiety Informed to continue taking medications as prescribed Discussed non pharmacological methods for management of symptoms   Other Visit Diagnoses     Dizziness    -  Primary   Relevant Medications   meclizine (ANTIVERT) 25 MG tablet Discussed non pharmacological methods for management of symptoms   Maintain upcoming follow up with PCP, sooner as needed     I am having Sheralee Burgher start on meclizine. I am also having her maintain her escitalopram, escitalopram, ARIPiprazole, and hydrOXYzine.  Meds ordered this encounter  Medications   meclizine (ANTIVERT) 25 MG tablet    Sig: Take 1 tablet (25 mg total) by mouth 3 (three) times daily as needed for dizziness.    Dispense:  30 tablet    Refill:  0     Teena Dunk, NP

## 2021-11-13 NOTE — Patient Instructions (Signed)
You were seen today in the [facility] for dizziness. You were prescribed medications, please take as directed. Please follow up as needed

## 2021-12-07 IMAGING — US US RENAL
1 series · 14 of 25 positions shown · non-contrast
Comparison: None.

CLINICAL DATA: Left flank pain, left lower quadrant pain

EXAM:
RENAL / URINARY TRACT ULTRASOUND COMPLETE

[Series 1: us renal · 14 of 49 slices shown]
[im 1/49]
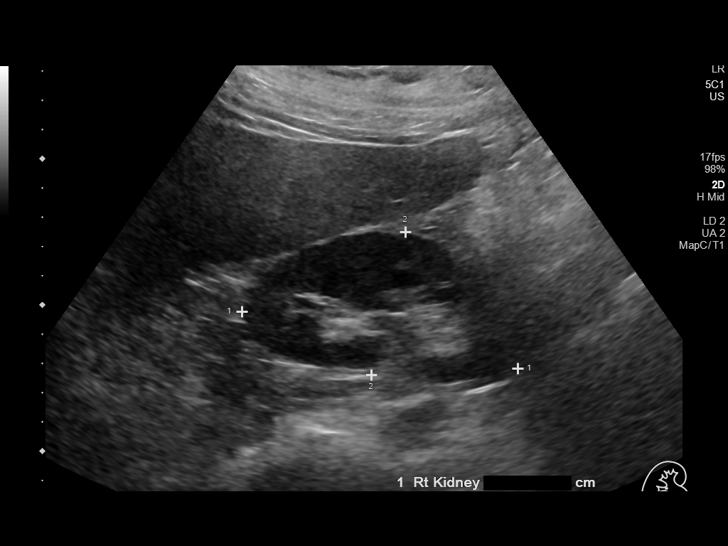
[im 5/49]
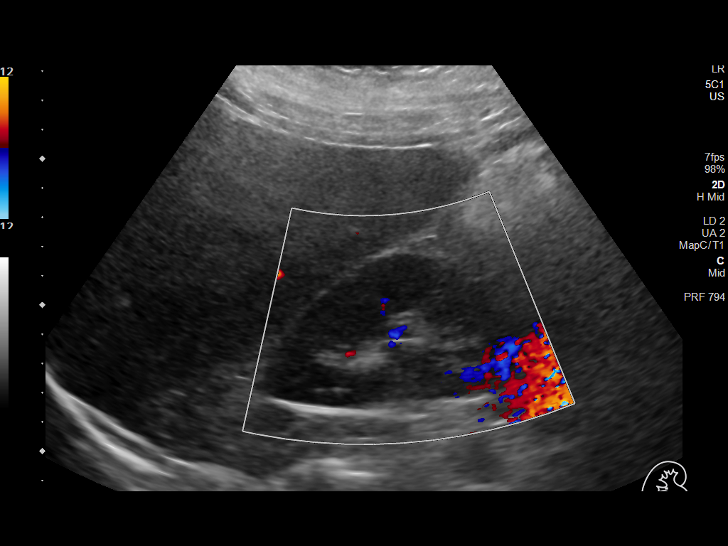
[im 9/49]
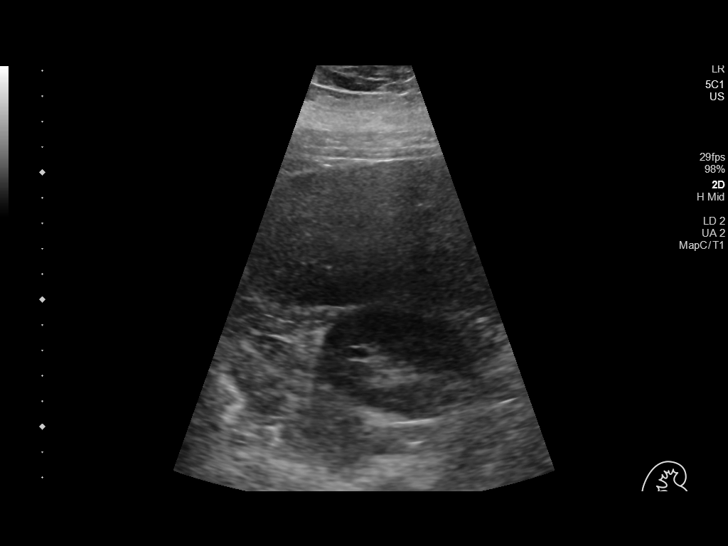
[im 13/49]
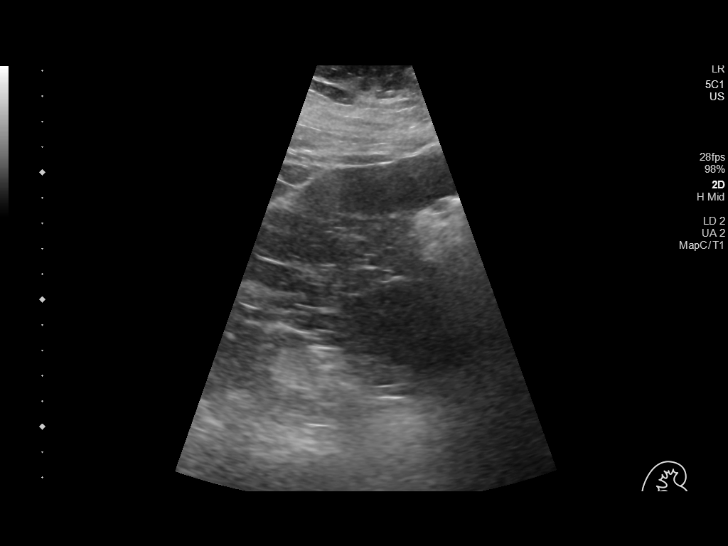
[im 17/49]
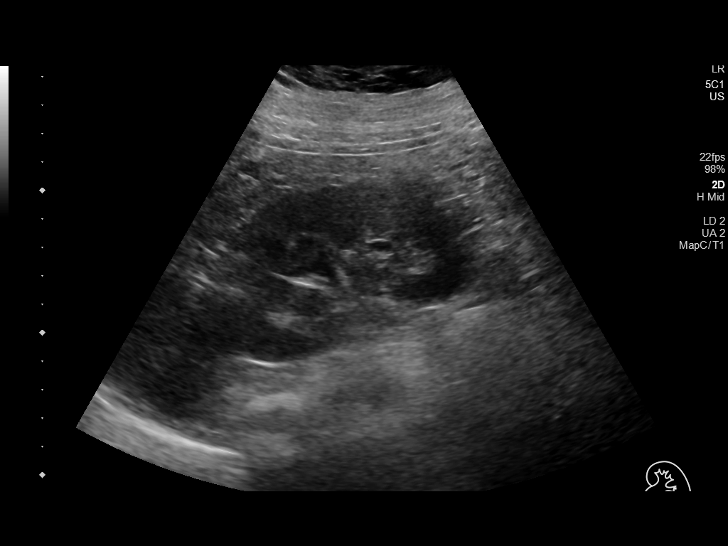
[im 19/49]
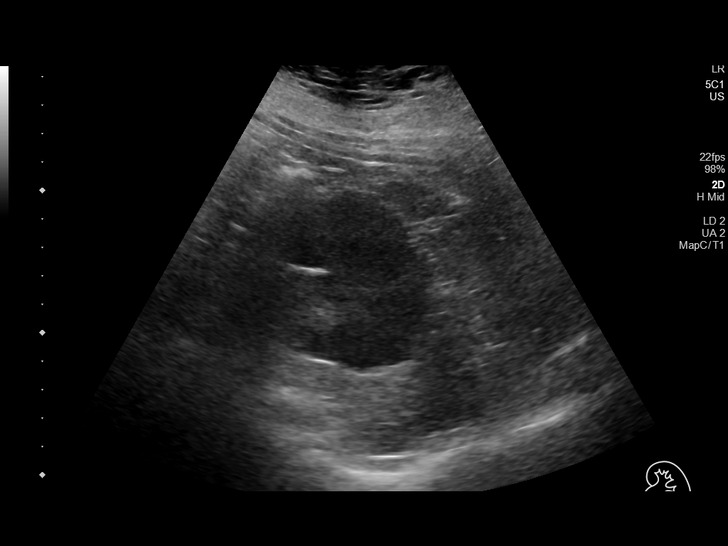
[im 23/49]
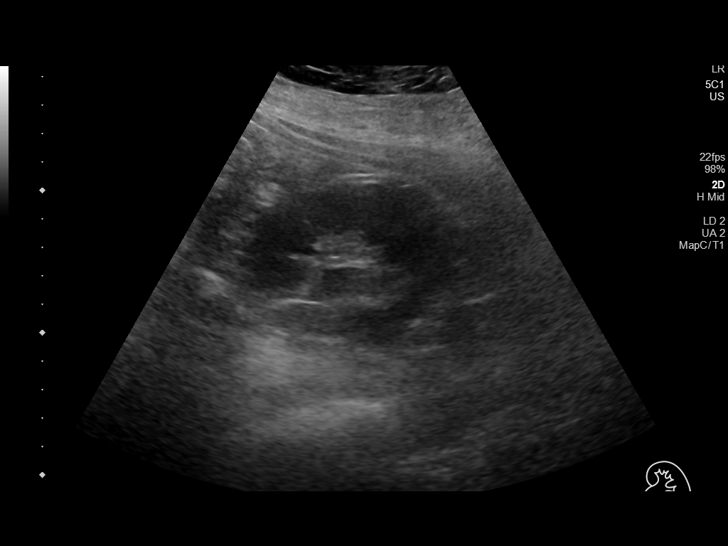
[im 27/49]
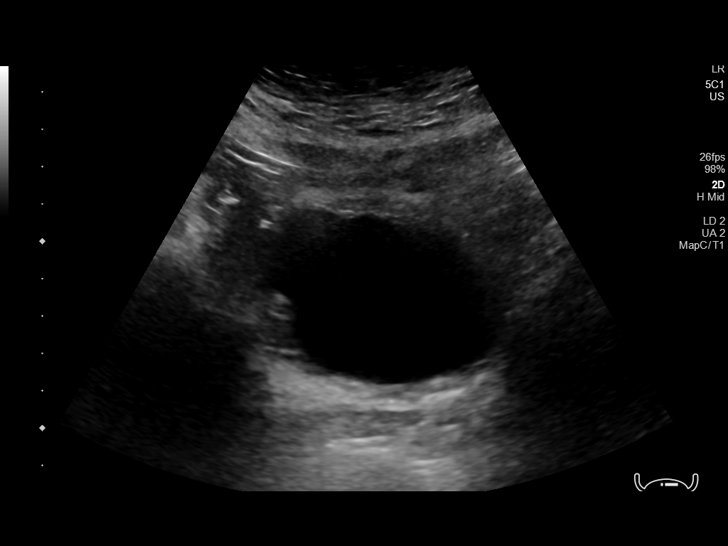
[im 31/49]
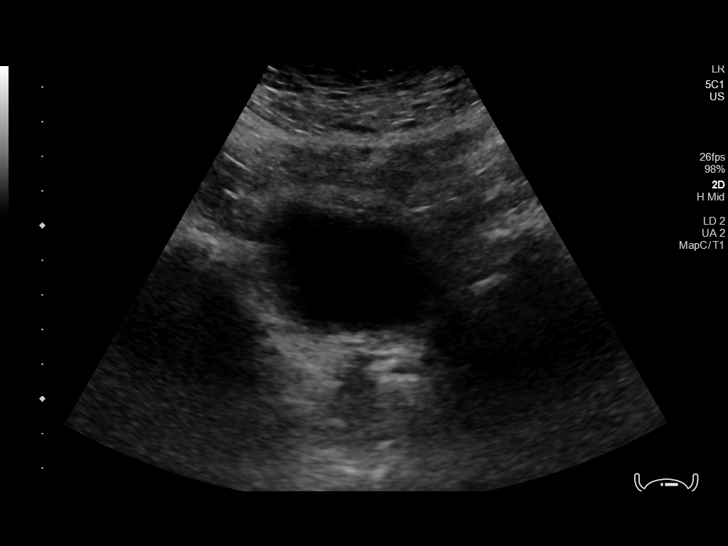
[im 33/49]
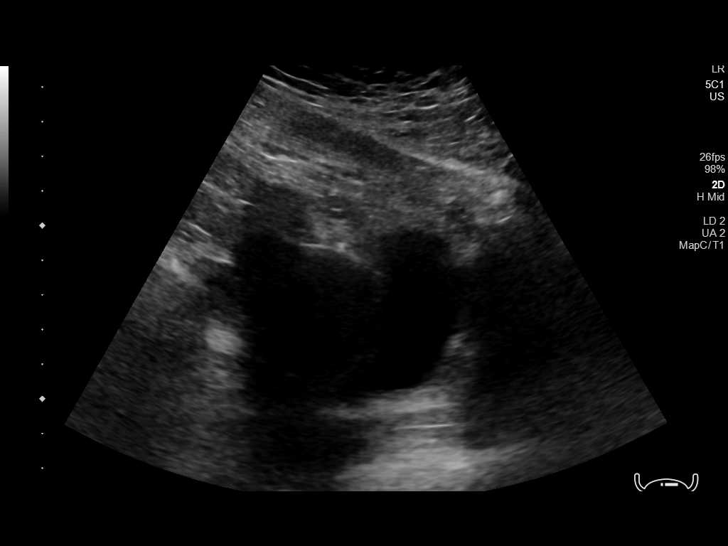
[im 37/49]
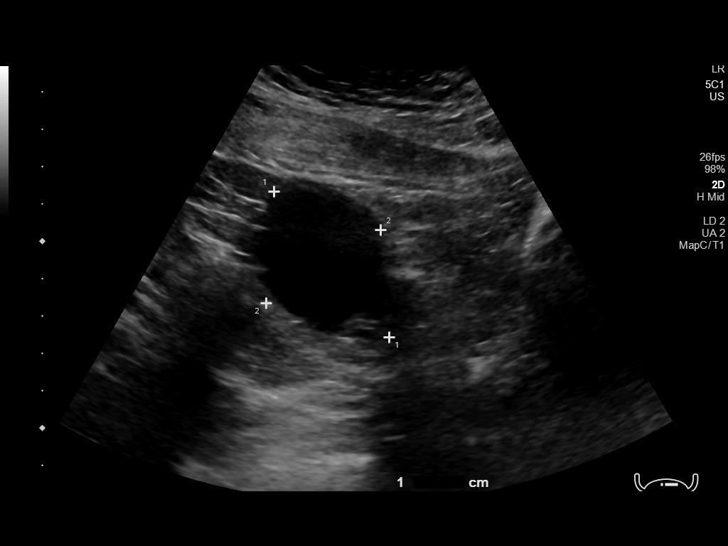
[im 41/49]
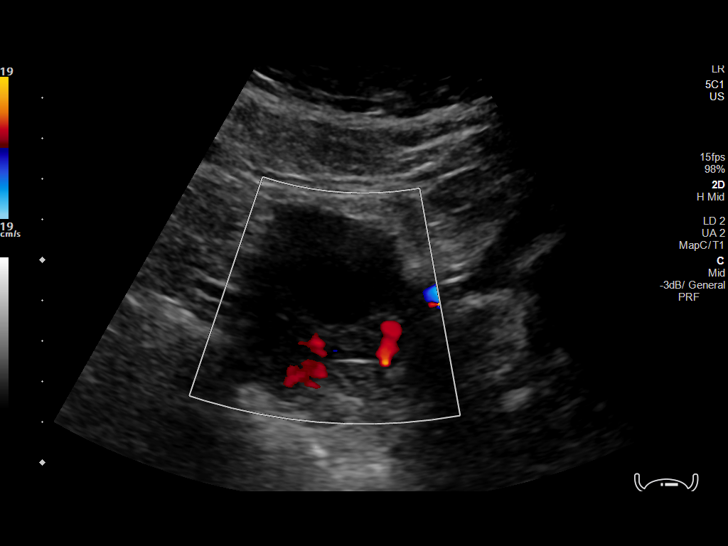
[im 45/49]
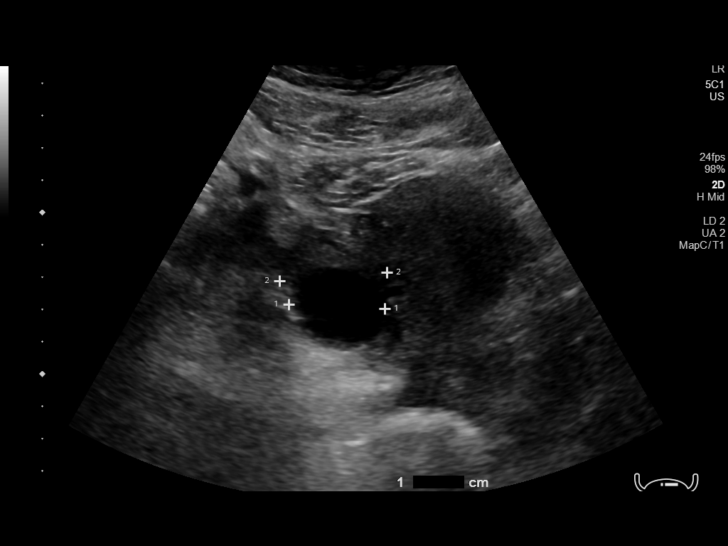
[im 49/49]
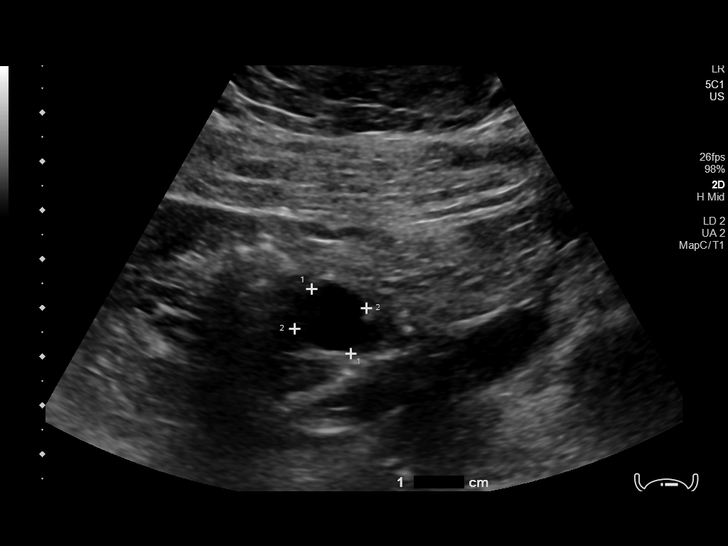

[14 of 25 positions shown; findings below may reference images not displayed]

FINDINGS: Right Kidney:

Renal measurements: 9.6 x 5.0 x 5.9 cm = volume: 149 mL .
Echogenicity within normal limits. No mass or hydronephrosis
visualized.

Left Kidney:

Renal measurements: 10.4 x 5.6 x 6.1 cm = volume: 185 mL.
Echogenicity within normal limits. No mass or hydronephrosis
visualized.

Bladder:

Appears normal for degree of bladder distention.

Other:

Incidental note of a 5.0 cm left ovarian cyst and a 3.7 cm right
ovarian cyst.
IMPRESSION: 1. Unremarkable ultrasound of the bilateral kidneys without evidence
of obstructive uropathy.
2. Incidental note of simple appearing bilateral ovarian cysts,
measuring up to 5.0 cm on the left. This a common finding in
premenopausal females. No imaging follow up is required. This
follows consensus guidelines: Simple Adnexal Cysts: SRU Consensus
Conference Update on Follow-up and Reporting. Radiology 1265;

## 2021-12-11 ENCOUNTER — Ambulatory Visit: Payer: Self-pay | Admitting: Nurse Practitioner

## 2022-01-29 ENCOUNTER — Ambulatory Visit
Admission: EM | Admit: 2022-01-29 | Discharge: 2022-01-29 | Disposition: A | Discharge status: Home / Self Care | Payer: Medicare Other | Attending: Emergency Medicine | Admitting: Emergency Medicine

## 2022-01-29 DIAGNOSIS — B9689 Other specified bacterial agents as the cause of diseases classified elsewhere: Secondary | ICD-10-CM | POA: Diagnosis not present

## 2022-01-29 DIAGNOSIS — J019 Acute sinusitis, unspecified: Secondary | ICD-10-CM | POA: Diagnosis not present

## 2022-01-29 MED ORDER — IBUPROFEN 800 MG PO TABS: 800.0000 mg | ORAL_TABLET | Freq: Three times a day (TID) | ORAL | 0 refills | Status: DC | PRN

## 2022-01-29 MED ORDER — IBUPROFEN 800 MG PO TABS
800.0000 mg | ORAL_TABLET | Freq: Once | ORAL | Status: AC
  Administered 2022-01-29: 800 mg via ORAL

## 2022-01-29 MED ORDER — FLUTICASONE PROPIONATE 50 MCG/ACT NA SUSP: 1.0000 | Freq: Every day | NASAL | 1 refills | Status: DC

## 2022-01-29 MED ORDER — CEFDINIR 300 MG PO CAPS: 300.0000 mg | ORAL_CAPSULE | Freq: Two times a day (BID) | ORAL | 0 refills | Status: AC

## 2022-01-29 MED ORDER — CETIRIZINE HCL 10 MG PO TABS: 10.0000 mg | ORAL_TABLET | Freq: Every day | ORAL | 2 refills | Status: DC

## 2022-01-29 NOTE — ED Provider Notes (Signed)
?[facility] ? ? ? ?CSN: [id number] ?Arrival date & time: 01/29/22  1254 ?  ? ?HISTORY  ? ?Chief Complaint  ?Patient presents with  ? Facial Pain  ? ?HPI ?Rebekah Davis is a 36 y.o. female. Patient reports a 1 day history of sinus pressure and pain.  Patient states it is acutely painful for her to bend forward to pick something up, states pain radiates to her teeth.  Patient states she also has a significant mount of pressure discomfort in her right ear as well.  Patient states she has never had sinus pressure like this in the past.  Patient states she is unaware of any history of allergies, does not currently take any allergy medications.  Patient denies fever, aches, chills, nausea, vomiting, diarrhea, cough, sore throat, loss of taste or smell, known sick contacts. ? ?The history is provided by the patient.  ?Past Medical History:  ?Diagnosis Date  ? Anxiety   ? Arthritis   ? Depression   ? History of kidney stones   ? Migraine   ? PCOS (polycystic ovarian syndrome)   ? PTSD (post-traumatic stress disorder)   ? ?Patient Active Problem List  ? Diagnosis Date Noted  ? Passive suicidal ideations 11/16/2020  ? Irregular bleeding 04/12/2020  ? Achilles tendinitis 03/11/2020  ? Ovarian cyst 02/19/2020  ? Deliberate self-cutting 01/23/2020  ? Non-suicidal self harm as coping mechanism (HCC) 12/25/2019  ? Diarrhea 06/13/2018  ? Hypomagnesemia 06/13/2018  ? Transaminitis 06/13/2018  ? C. difficile diarrhea 06/12/2018  ? Oral thrush 06/12/2018  ? Suicidal ideation 04/12/2018  ? Chest pain 03/28/2018  ? Obesity 03/28/2018  ? OSA (obstructive sleep apnea) 03/28/2018  ? Acute cystitis 03/10/2018  ? GERD (gastroesophageal reflux disease) 03/10/2018  ? Depression with suicidal ideation 03/09/2018  ? PTSD (post-traumatic stress disorder) 03/09/2018  ? 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 Polycystic disease, ovaries 07/05/2016  ? Abdominal pain, suprapubic 06/04/2016   ? Spondylisthesis 04/20/2015  ? Herniated lumbar intervertebral disc 02/28/2013  ? Sciatica of left side 02/28/2013  ? Borderline personality disorder (HCC) 12/29/2012  ? Anxiety 12/24/2012  ? Essential hypertension 12/24/2012  ? Low back pain 07/22/2012  ? ?Past Surgical History:  ?Procedure Laterality Date  ? TENOLYSIS Left 10/04/2020  ? Procedure: LEFT ACHILLES TENDON DEBRIDEMENT AND RECONSTRUCTION, CALCANEAL EXOSTECTOMY, GASTROCNEMIUS RECESSION,  FLEXOR HALLUCIS LONGUS TRANSFER;  Surgeon: Terance Hart, MD;  Location: [facility] OR;  Service: Orthopedics;  Laterality: Left;  LENGTH OF SURGERY: 1.5 HOURS  ? ?OB History   ?No obstetric history on file. ?  ? ?Home Medications   ? ?Prior to Admission medications   ?Medication Sig Start Date End Date Taking? Authorizing Provider  ?ARIPiprazole (ABILIFY) 20 MG tablet Take 1 tablet (20 mg total) by mouth daily. 09/03/21 09/03/22  Barbette Merino, NP  ?escitalopram (LEXAPRO) 10 MG tablet Take 1 tablet (10 mg total) by mouth daily. 03/06/21 03/06/22  Barbette Merino, NP  ?escitalopram (LEXAPRO) 5 MG tablet Take 1 tablet (5 mg total) by mouth daily. 03/06/21 03/06/22  Barbette Merino, NP  ?hydrOXYzine (VISTARIL) 25 MG capsule Take 1 capsule (25 mg total) by mouth 3 (three) times daily as needed for anxiety. 10/25/21   Jackelyn Poling, NP  ?meclizine (ANTIVERT) 25 MG tablet Take 1 tablet (25 mg total) by mouth 3 (three) times daily as needed for dizziness. 11/13/21   Orion Crook I, NP  ? ?Family History ?Family History  ?Problem Relation Age  of Onset  ? Hypertension Mother   ? Diabetes Mother   ? Kidney disease Mother   ? Depression Mother   ? Hypertension Father   ? Diabetes Father   ? ?Social History ?Social History  ? ?Tobacco Use  ? Smoking status: Former  ?  Packs/day: 1.00  ?  Years: 10.00  ?  Pack years: 10.00  ?  Types: Cigarettes  ?  Quit date: 03/2019  ?  Years since quitting: 2.8  ? Smokeless tobacco: Never  ?Vaping Use  ? Vaping Use: Never used  ?Substance Use  Topics  ? Alcohol use: Not Currently  ? Drug use: Never  ? ?Allergies   ?Adhesive [tape], Cherry, Other, Sulfamethoxazole-trimethoprim, Levofloxacin, Morphine, Gabapentin, Metformin, and Sumatriptan ? ?Review of Systems ?Review of Systems ?Pertinent findings noted in history of present illness.  ? ?Physical Exam ?Triage Vital Signs ?ED Triage Vitals  ?Enc Vitals Group  ?   BP 08/11/21 0827 (!) 147/82  ?   Pulse Rate 08/11/21 0827 72  ?   Resp 08/11/21 0827 18  ?   Temp 08/11/21 0827 98.3 ?F (36.8 ?C)  ?   Temp Source 08/11/21 0827 Oral  ?   SpO2 08/11/21 0827 98 %  ?   Weight --   ?   Height --   ?   Head Circumference --   ?   Peak Flow --   ?   Pain Score 08/11/21 0826 5  ?   Pain Loc --   ?   Pain Edu? --   ?   Excl. in GC? --   ?No data found. ? ?Updated Vital Signs ?BP (!) 150/78 (BP Location: Right Arm)   Pulse (!) 132 Comment: Pt states she had anxiety  Temp 99.2 ?F (37.3 ?C) (Oral)   Resp 20   LMP 01/13/2022   SpO2 98%  ? ?Physical Exam ?Vitals and nursing note reviewed.  ?Constitutional:   ?   General: She is not in acute distress. ?   Appearance: Normal appearance. She is not ill-appearing.  ?HENT:  ?   Head: Normocephalic and atraumatic.  ?   Salivary Glands: Right salivary gland is not diffusely enlarged or tender. Left salivary gland is not diffusely enlarged or tender.  ?   Right Ear: Ear canal and external ear normal. No drainage. A middle ear effusion is present. There is no impacted cerumen. Tympanic membrane is bulging. Tympanic membrane is not injected or erythematous.  ?   Left Ear: Ear canal and external ear normal. No drainage. A middle ear effusion is present. There is no impacted cerumen. Tympanic membrane is bulging. Tympanic membrane is not injected or erythematous.  ?   Ears:  ?   Comments: Bilateral EACs normal, both TMs bulging with clear fluid ?   Nose: Rhinorrhea present. No nasal deformity, septal deviation, signs of injury, nasal tenderness, mucosal edema or congestion.  Rhinorrhea is clear.  ?   Right Nostril: Occlusion present. No foreign body, epistaxis or septal hematoma.  ?   Left Nostril: Occlusion present. No foreign body, epistaxis or septal hematoma.  ?   Right Turbinates: Enlarged, swollen and pale.  ?   Left Turbinates: Enlarged, swollen and pale.  ?   Right Sinus: No maxillary sinus tenderness or frontal sinus tenderness.  ?   Left Sinus: No maxillary sinus tenderness or frontal sinus tenderness.  ?   Mouth/Throat:  ?   Lips: Pink. No lesions.  ?   Mouth: Mucous  membranes are moist. No oral lesions.  ?   Pharynx: Oropharynx is clear. Uvula midline. No posterior oropharyngeal erythema or uvula swelling.  ?   Tonsils: No tonsillar exudate. 0 on the right. 0 on the left.  ?   Comments: Postnasal drip ?Eyes:  ?   General: Lids are normal.     ?   Right eye: No discharge.     ?   Left eye: No discharge.  ?   Extraocular Movements: Extraocular movements intact.  ?   Conjunctiva/sclera: Conjunctivae normal.  ?   Right eye: Right conjunctiva is not injected.  ?   Left eye: Left conjunctiva is not injected.  ?Neck:  ?   Trachea: Trachea and phonation normal.  ?Cardiovascular:  ?   Rate and Rhythm: Normal rate and regular rhythm.  ?   Pulses: Normal pulses.  ?   Heart sounds: Normal heart sounds. No murmur heard. ?  No friction rub. No gallop.  ?Pulmonary:  ?   Effort: Pulmonary effort is normal. No accessory muscle usage, prolonged expiration or respiratory distress.  ?   Breath sounds: Normal breath sounds. No stridor, decreased air movement or transmitted upper airway sounds. No decreased breath sounds, wheezing, rhonchi or rales.  ?Chest:  ?   Chest wall: No tenderness.  ?Musculoskeletal:     ?   General: Normal range of motion.  ?   Cervical back: Normal range of motion and neck supple. Normal range of motion.  ?Lymphadenopathy:  ?   Cervical: No cervical adenopathy.  ?Skin: ?   General: Skin is warm and dry.  ?   Findings: No erythema or rash.  ?Neurological:  ?   General:  No focal deficit present.  ?   Mental Status: She is alert and oriented to person, place, and time.  ?Psychiatric:     ?   Mood and Affect: Mood normal.     ?   Behavior: Behavior normal.  ? ? ?Visual Acuity ?Right E

## 2022-01-29 NOTE — ED Triage Notes (Signed)
Pt states yesterday she began feeling sinus pressure. ?

## 2022-01-29 NOTE — Discharge Instructions (Signed)
Based on physical exam findings, I believe that you are suffering from an acute attack of sinusitis.  I recommend that you begin antibiotics and antihistamines at this time.  Also recommend that you use a nasal steroid.  Finally, I believe that ibuprofen which is a nonsteroidal anti-inflammatory medication will significantly open up your sinus passages and allow all of the excess fluid causing pressure and pain to drain nicely. ? ?Please see the list below for recommended medications, dosages and frequencies to provide relief of your current symptoms:   ? ?Cefdinir (Omnicef):  1 capsule twice daily for 10 days, you can take it with or without food.  This antibiotic can cause upset stomach, this will resolve once antibiotics are complete.  You are welcome to use a probiotic, eat yogurt, take Imodium while taking this medication.  Please avoid other systemic medications such as Maalox, Pepto-Bismol or milk of magnesia as they can interfere with your body's ability to absorb the antibiotics. ? ?Zyrtec (cetirizine): This is an excellent second-generation antihistamine that helps to reduce respiratory inflammatory response to environmental allergens.  In some patients, this medication can cause daytime sleepiness so I recommend that you take 1 tablet daily at bedtime.   ?  ?Flonase (fluticasone): This is a steroid nasal spray that you use once daily, 1 spray in each nare.  This medication does not work well if you decide to use it only used as you feel you need to, it works best used on a daily basis.  After 3 to 5 days of use, you will notice significant reduction of the inflammation and mucus production that is currently being caused by exposure to allergens, whether seasonal or environmental.  The most common side effect of this medication is nosebleeds.  If you experience a nosebleed, please discontinue use for 1 week, then feel free to resume.  I have provided you with a prescription but you can also purchase this  medication over-the-counter if your insurance will not cover it. ? ?Ibuprofen  (Advil, Motrin): This is a good anti-inflammatory medication which addresses aches, pains and inflammation of the upper airways that causes sinus and nasal congestion as well as in the lower airways which makes your cough feel tight and sometimes burn.  I have provided you with a prescription for 800 mg 3 times daily as needed for pain and pressure.    ?  ?Please follow-up within the next 7-10 days either with your primary care provider or urgent care if your symptoms do not resolve.  If you do not have a primary care provider, we will assist you in finding one. ?  ?Thank you for visiting urgent care today.  We appreciate the opportunity to participate in your care. ? ?

## 2022-02-01 ENCOUNTER — Ambulatory Visit
Admission: EM | Admit: 2022-02-01 | Discharge: 2022-02-01 | Disposition: A | Discharge status: Home / Self Care | Payer: Medicare Other

## 2022-02-01 DIAGNOSIS — J019 Acute sinusitis, unspecified: Secondary | ICD-10-CM

## 2022-02-01 DIAGNOSIS — B9689 Other specified bacterial agents as the cause of diseases classified elsewhere: Secondary | ICD-10-CM

## 2022-02-01 NOTE — ED Provider Notes (Signed)
?[facility] ? ? ? ?CSN: [id number] ?Arrival date & time: 02/01/22  1106 ?  ? ?HISTORY  ? ?Chief Complaint  ?Patient presents with  ? Cough  ? Nasal Congestion  ? Ear Fullness  ? ?HPI ?Rebekah Davis is a 36 y.o. female. Pt states she is having bilateral ear pressure but mainly in her right ear, a persistent cough and nasal congestion.  Patient was seen 3 days ago and diagnosed with acute bacterial sinusitis for which she was prescribed cefdinir, Zyrtec, ibuprofen and Flonase.  Patient states she is taking everything as of the Flonase at this time.  Patient is afebrile on arrival. ? ?The history is provided by the patient.  ?Past Medical History:  ?Diagnosis Date  ? Anxiety   ? Arthritis   ? Depression   ? History of kidney stones   ? Migraine   ? PCOS (polycystic ovarian syndrome)   ? PTSD (post-traumatic stress disorder)   ? ?Patient Active Problem List  ? Diagnosis Date Noted  ? Passive suicidal ideations 11/16/2020  ? Irregular bleeding 04/12/2020  ? Achilles tendinitis 03/11/2020  ? Ovarian cyst 02/19/2020  ? Deliberate self-cutting 01/23/2020  ? Non-suicidal self harm as coping mechanism (HCC) 12/25/2019  ? Diarrhea 06/13/2018  ? Hypomagnesemia 06/13/2018  ? Transaminitis 06/13/2018  ? C. difficile diarrhea 06/12/2018  ? Oral thrush 06/12/2018  ? Suicidal ideation 04/12/2018  ? Chest pain 03/28/2018  ? Obesity 03/28/2018  ? OSA (obstructive sleep apnea) 03/28/2018  ? Acute cystitis 03/10/2018  ? GERD (gastroesophageal reflux disease) 03/10/2018  ? Depression with suicidal ideation 03/09/2018  ? PTSD (post-traumatic stress disorder) 03/09/2018  ? 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 Polycystic disease, ovaries 07/05/2016  ? Abdominal pain, suprapubic 06/04/2016  ? Spondylisthesis 04/20/2015  ? Herniated lumbar intervertebral disc 02/28/2013  ? Sciatica of left side 02/28/2013  ? Borderline personality disorder (HCC) 12/29/2012  ? Anxiety 12/24/2012   ? Essential hypertension 12/24/2012  ? Low back pain 07/22/2012  ? ?Past Surgical History:  ?Procedure Laterality Date  ? TENOLYSIS Left 10/04/2020  ? Procedure: LEFT ACHILLES TENDON DEBRIDEMENT AND RECONSTRUCTION, CALCANEAL EXOSTECTOMY, GASTROCNEMIUS RECESSION,  FLEXOR HALLUCIS LONGUS TRANSFER;  Surgeon: Terance Hart, MD;  Location: [facility] OR;  Service: Orthopedics;  Laterality: Left;  LENGTH OF SURGERY: 1.5 HOURS  ? ?OB History   ?No obstetric history on file. ?  ? ?Home Medications   ? ?Prior to Admission medications   ?Medication Sig Start Date End Date Taking? Authorizing Provider  ?ARIPiprazole (ABILIFY) 20 MG tablet Take 1 tablet (20 mg total) by mouth daily. 09/03/21 09/03/22  Barbette Merino, NP  ?cefdinir (OMNICEF) 300 MG capsule Take 1 capsule (300 mg total) by mouth 2 (two) times daily for 10 days. 01/29/22 02/08/22  Theadora Rama Scales, PA-C  ?cetirizine (ZYRTEC ALLERGY) 10 MG tablet Take 1 tablet (10 mg total) by mouth at bedtime. 01/29/22 04/29/22  Theadora Rama Scales, PA-C  ?escitalopram (LEXAPRO) 10 MG tablet Take 1 tablet (10 mg total) by mouth daily. 03/06/21 03/06/22  Barbette Merino, NP  ?escitalopram (LEXAPRO) 5 MG tablet Take 1 tablet (5 mg total) by mouth daily. 03/06/21 03/06/22  Barbette Merino, NP  ?fluticasone (FLONASE) 50 MCG/ACT nasal spray Place 1 spray into both nostrils daily. Begin by using 2 sprays in each nare daily for 3 to 5 days, then decrease to 1 spray in each nare daily. 01/29/22   Theadora Rama Scales, PA-C  ?hydrOXYzine (VISTARIL) 25 MG  capsule Take 1 capsule (25 mg total) by mouth 3 (three) times daily as needed for anxiety. 10/25/21   Jackelyn Poling, NP  ?ibuprofen (ADVIL) 800 MG tablet Take 1 tablet (800 mg total) by mouth every 8 (eight) hours as needed for up to 21 doses for fever, headache, mild pain or moderate pain. 01/29/22   Theadora Rama Scales, PA-C  ?meclizine (ANTIVERT) 25 MG tablet Take 1 tablet (25 mg total) by mouth 3 (three) times daily as needed for  dizziness. 11/13/21   Orion Crook I, NP  ? ?Family History ?Family History  ?Problem Relation Age of Onset  ? Hypertension Mother   ? Diabetes Mother   ? Kidney disease Mother   ? Depression Mother   ? Hypertension Father   ? Diabetes Father   ? ?Social History ?Social History  ? ?Tobacco Use  ? Smoking status: Former  ?  Packs/day: 1.00  ?  Years: 10.00  ?  Pack years: 10.00  ?  Types: Cigarettes  ?  Quit date: 03/2019  ?  Years since quitting: 2.8  ? Smokeless tobacco: Never  ?Vaping Use  ? Vaping Use: Never used  ?Substance Use Topics  ? Alcohol use: Not Currently  ? Drug use: Never  ? ?Allergies   ?Adhesive [tape], Cherry, Other, Sulfamethoxazole-trimethoprim, Levofloxacin, Morphine, Gabapentin, Metformin, and Sumatriptan ? ?Review of Systems ?Review of Systems ?Pertinent findings noted in history of present illness.  ? ?Physical Exam ?Triage Vital Signs ?ED Triage Vitals  ?Enc Vitals Group  ?   BP 08/11/21 0827 (!) 147/82  ?   Pulse Rate 08/11/21 0827 72  ?   Resp 08/11/21 0827 18  ?   Temp 08/11/21 0827 98.3 ?F (36.8 ?C)  ?   Temp Source 08/11/21 0827 Oral  ?   SpO2 08/11/21 0827 98 %  ?   Weight --   ?   Height --   ?   Head Circumference --   ?   Peak Flow --   ?   Pain Score 08/11/21 0826 5  ?   Pain Loc --   ?   Pain Edu? --   ?   Excl. in GC? --   ?No data found. ? ?Updated Vital Signs ?BP (!) 164/101 (BP Location: Right Wrist)   Pulse 91   Temp 97.6 ?F (36.4 ?C) (Oral)   Resp 18   LMP 01/13/2022   SpO2 98%  ? ?Physical Exam ?Vitals and nursing note reviewed.  ?Constitutional:   ?   General: She is not in acute distress. ?   Appearance: Normal appearance. She is not ill-appearing.  ?HENT:  ?   Head: Normocephalic and atraumatic.  ?   Salivary Glands: Right salivary gland is not diffusely enlarged or tender. Left salivary gland is not diffusely enlarged or tender.  ?   Right Ear: Ear canal and external ear normal. No drainage. A middle ear effusion is present. There is no impacted cerumen.  Tympanic membrane is bulging. Tympanic membrane is not injected or erythematous.  ?   Left Ear: Ear canal and external ear normal. No drainage. A middle ear effusion is present. There is no impacted cerumen. Tympanic membrane is bulging. Tympanic membrane is not injected or erythematous.  ?   Ears:  ?   Comments: Bilateral EACs normal, both TMs bulging with clear fluid ?   Nose: Rhinorrhea present. No nasal deformity, septal deviation, signs of injury, nasal tenderness, mucosal edema or congestion. Rhinorrhea is clear.  ?  Right Nostril: Occlusion present. No foreign body, epistaxis or septal hematoma.  ?   Left Nostril: Occlusion present. No foreign body, epistaxis or septal hematoma.  ?   Right Turbinates: Enlarged, swollen and pale.  ?   Left Turbinates: Enlarged, swollen and pale.  ?   Right Sinus: No maxillary sinus tenderness or frontal sinus tenderness.  ?   Left Sinus: No maxillary sinus tenderness or frontal sinus tenderness.  ?   Mouth/Throat:  ?   Lips: Pink. No lesions.  ?   Mouth: Mucous membranes are moist. No oral lesions.  ?   Pharynx: Oropharynx is clear. Uvula midline. No posterior oropharyngeal erythema or uvula swelling.  ?   Tonsils: No tonsillar exudate. 0 on the right. 0 on the left.  ?   Comments: Postnasal drip ?Eyes:  ?   General: Lids are normal.     ?   Right eye: No discharge.     ?   Left eye: No discharge.  ?   Extraocular Movements: Extraocular movements intact.  ?   Conjunctiva/sclera: Conjunctivae normal.  ?   Right eye: Right conjunctiva is not injected.  ?   Left eye: Left conjunctiva is not injected.  ?Neck:  ?   Trachea: Trachea and phonation normal.  ?Cardiovascular:  ?   Rate and Rhythm: Normal rate and regular rhythm.  ?   Pulses: Normal pulses.  ?   Heart sounds: Normal heart sounds. No murmur heard. ?  No friction rub. No gallop.  ?Pulmonary:  ?   Effort: Pulmonary effort is normal. No accessory muscle usage, prolonged expiration or respiratory distress.  ?   Breath  sounds: Normal breath sounds. No stridor, decreased air movement or transmitted upper airway sounds. No decreased breath sounds, wheezing, rhonchi or rales.  ?Chest:  ?   Chest wall: No tenderness.  ?Musculoskeletal:

## 2022-02-01 NOTE — ED Triage Notes (Signed)
Pt states she is having bilateral ear pressure but mainly to the right ear, a worsening cough and congestion. ?

## 2022-02-01 NOTE — Discharge Instructions (Signed)
Please continue cefdinir, Zyrtec, Flonase and ibuprofen for your symptoms.  When taken together, this combination medication should resolve your symptoms.  Please follow-up with your primary care provider if you do not have relief of your symptoms in the next 7 to 10 days. ?

## 2022-02-02 ENCOUNTER — Ambulatory Visit (INDEPENDENT_AMBULATORY_CARE_PROVIDER_SITE_OTHER): Payer: Medicare Other | Admitting: Family

## 2022-02-02 ENCOUNTER — Encounter: Payer: Self-pay | Admitting: Family

## 2022-02-02 VITALS — BP 152/73 | HR 102 | Temp 98.8°F | Ht 65.0 in | Wt 335.0 lb

## 2022-02-02 DIAGNOSIS — J0140 Acute pansinusitis, unspecified: Secondary | ICD-10-CM | POA: Diagnosis not present

## 2022-02-02 DIAGNOSIS — I10 Essential (primary) hypertension: Secondary | ICD-10-CM

## 2022-02-02 DIAGNOSIS — F419 Anxiety disorder, unspecified: Secondary | ICD-10-CM | POA: Diagnosis not present

## 2022-02-02 MED ORDER — HYDROXYZINE HCL 10 MG PO TABS: 10.0000 mg | ORAL_TABLET | Freq: Three times a day (TID) | ORAL | 0 refills | Status: DC | PRN

## 2022-02-02 NOTE — Patient Instructions (Signed)
Welcome to [facility]! It was a pleasure meeting you today. ? ?As discussed, I sent a lower dose of the Vistaril for you to try. ?Continue the Cefdinir antibiotic for 3 more days, do not have to take for 10 days as long as your symptoms are better. ?Start the Flonase nasal spray, 1 squirt each side twice a day for 1 week, then reduce to daily use until end of May and then you can stop using as long as no sinus symptoms. ?You can take the Zyrtec if the Flonase is not controlling all of your symptoms. Take this at bedtime as it can sometimes cause drowsiness. ? ?Please schedule a 1 month follow up visit today. ? ? ? ?PLEASE NOTE: ? ?If you had any LAB tests please let us know if you have not heard back within a few days. You may see your results on MyChart before we have a chance to review them but we will give you a call once they are reviewed by [place]. If we ordered any REFERRALS today, please let us know if you have not heard from their office within the next week.  ?Let us know through MyChart if you are needing REFILLS, or have your pharmacy send [place] the request. You can also use MyChart to communicate with me or any office staff. ? ?Please try these tips to maintain a healthy lifestyle: ? ?Eat most of your calories during the day when you are active. Eliminate processed foods including packaged sweets (pies, cakes, cookies), reduce intake of potatoes, white bread, white pasta, and white rice. Look for whole grain options, oat flour or almond flour. ? ?Each meal should contain half fruits/vegetables, one quarter protein, and one quarter carbs (no bigger than a computer mouse). ? ?Cut down on sweet beverages. This includes juice, soda, and sweet tea. Also watch fruit intake, though this is a healthier sweet option, it still contains natural sugar! Limit to 3 servings daily. ? ?Drink at least 1 glass of water with each meal and aim for at least 8 glasses per day ? ?Exercise at least 150  minutes every week.  ? ?

## 2022-02-02 NOTE — Progress Notes (Signed)
? ?New Patient Office Visit ? ?Subjective:  ?Patient ID: Rebekah KailLiberty Davis, female    DOB: 24-Mar-1986  Age: 36 y.o. MRN: [id number] ? ?CC:  ?Chief Complaint  ?Patient presents with  ? Sinus Problem  ?  Pt states she has been taking antibiotics from urgent care on 4/17. Her ears and chest congestion.   ? Establish Care  ? Anxiety  ? Depression  ? ?HPI ?Rebekah Davis presents for establishing care. ?Sinusitis: Patient complains of congestion, facial pain, headache described as frontal, nasal congestion, non productive cough, post nasal drip, purulent nasal discharge, sinus pressure, sore throat, and tooth pain, with no fever, chills, night sweats or weight loss. Onset of symptoms was 2 weeks ago, gradually improving since that time. She is drinking moderate amounts of fluids.  Past history is significant for no history of pneumonia or bronchitis. Patient is non-smoker.  ?Anxiety/Depression: Patient complains of anxiety disorder and depression.   ?She has the following symptoms: difficulty concentrating, feelings of losing control, irritable, palpitations, racing thoughts, sweating.  ?Onset of symptoms was approximately  years ago, She denies current suicidal and homicidal ideation.  ?Possible organic causes contributing are: none.  ?Risk factors: none  ?Previous treatment includes Lexapro and Abilify, Vistaril  and individual therapy.  She complains of the following side effects from the treatment: none. ?Hypertension: Patient is currently maintained on the following medications for blood pressure: none. ?Failed meds include: none. ?Patient denies chest pain, headaches, shortness of breath or swelling. ?Last 3 blood pressure readings in our office are as follows: ?BP Readings from Last 3 Encounters:  ?02/02/22 (!) 152/73  ?02/01/22 (!) 164/101  ?01/29/22 (!) 150/78  ?  ? ?Past Medical History:  ?Diagnosis Date  ? Abdominal pain, suprapubic 06/04/2016  ? Acute cystitis 03/10/2018  ? Anxiety   ? Arthritis   ? C. difficile  diarrhea 06/12/2018  ? Cataract   ? Chest pain 03/28/2018  ? Depression   ? Depression with suicidal ideation 03/09/2018  ? Diarrhea 06/13/2018  ? GERD (gastroesophageal reflux disease)   ? History of kidney stones   ? Hypomagnesemia 06/13/2018  ? Low back pain 07/22/2012  ? Migraine   ? Oral thrush 06/12/2018  ? PCOS (polycystic ovarian syndrome)   ? PTSD (post-traumatic stress disorder)   ? Sleep apnea   ? Substance abuse (HCC)   ? Suicidal ideation 04/12/2018  ? ? ?Past Surgical History:  ?Procedure Laterality Date  ? TENOLYSIS Left 10/04/2020  ? Procedure: LEFT ACHILLES TENDON DEBRIDEMENT AND RECONSTRUCTION, CALCANEAL EXOSTECTOMY, GASTROCNEMIUS RECESSION,  FLEXOR HALLUCIS LONGUS TRANSFER;  Surgeon: Terance HartAdair, Christopher R, MD;  Location: [facility] OR;  Service: Orthopedics;  Laterality: Left;  LENGTH OF SURGERY: 1.5 HOURS  ? ? ?Family History  ?Problem Relation Age of Onset  ? Hypertension Mother   ? Diabetes Mother   ? Kidney disease Mother   ? Depression Mother   ? Hypertension Father   ? Diabetes Father   ? ? ?Social History  ? ?Socioeconomic History  ? Marital status: Single  ?  Spouse name: Not on file  ? Number of children: 0  ? Years of education: Not on file  ? Highest education level: Bachelor's degree (e.g., BA, AB, BS)  ?Occupational History  ? Occupation: unemployed  ?Tobacco Use  ? Smoking status: Former  ?  Packs/day: 1.00  ?  Years: 10.00  ?  Pack years: 10.00  ?  Types: Cigarettes  ?  Quit date: 03/2019  ?  Years since quitting: 2.8  ?  Smokeless tobacco: Never  ?Vaping Use  ? Vaping Use: Never used  ?Substance and Sexual Activity  ? Alcohol use: Not Currently  ?  Alcohol/week: 20.0 standard drinks  ?  Types: 20 Glasses of wine per week  ? Drug use: Not Currently  ?  Types: Marijuana  ? Sexual activity: Not Currently  ?Other Topics Concern  ? Not on file  ?Social History Narrative  ? Not on file  ? ?Social Determinants of Health  ? ?Financial Resource Strain: Not on file  ?Food Insecurity: No Food Insecurity  ?  Worried About Programme researcher, broadcasting/film/video in the Last Year: Never true  ? Ran Out of Food in the Last Year: Never true  ?Transportation Needs: Not on file  ?Physical Activity: Not on file  ?Stress: Not on file  ?Social Connections: Not on file  ?Intimate Partner Violence: Not on file  ? ? ?Objective:  ? ?Today's Vitals: BP (!) 152/73 (BP Location: Left Arm, Patient Position: Sitting, Cuff Size: Large)   Pulse (!) 102   Temp 98.8 ?F (37.1 ?C) (Temporal)   Ht 5\' 5"  (1.651 m)   Wt (!) 335 lb (152 kg)   LMP 01/13/2022 (Approximate)   SpO2 98%   BMI 55.75 kg/m?  ? ?Physical Exam ?Vitals and nursing note reviewed.  ?Constitutional:   ?   Appearance: Normal appearance. She is morbidly obese.  ?HENT:  ?   Right Ear: Tympanic membrane and ear canal normal.  ?   Left Ear: Tympanic membrane and ear canal normal.  ?   Nose:  ?   Right Sinus: No maxillary sinus tenderness or frontal sinus tenderness.  ?   Left Sinus: No maxillary sinus tenderness or frontal sinus tenderness.  ?   Mouth/Throat:  ?   Pharynx: Oropharyngeal exudate present. No pharyngeal swelling, posterior oropharyngeal erythema or uvula swelling.  ?Cardiovascular:  ?   Rate and Rhythm: Normal rate and regular rhythm.  ?Pulmonary:  ?   Effort: Pulmonary effort is normal.  ?   Breath sounds: Normal breath sounds.  ?Musculoskeletal:     ?   General: Normal range of motion.  ?Skin: ?   General: Skin is warm and dry.  ?Neurological:  ?   Mental Status: She is alert.  ?Psychiatric:     ?   Mood and Affect: Mood normal.     ?   Behavior: Behavior normal.  ? ? ?Assessment & Plan:  ? ?Problem List Items Addressed This Visit   ? ?  ? Cardiovascular and Mediastinum  ? Essential hypertension  ?  Chronic - pt not taking any medication currently, reports having increased anxiety currently, her niece died recently and still battling a sinus infection. pt to return in 1 mo, will start treatment. ? ?  ?  ?  ? Other  ? Anxiety  ?  Chronic -followed by [facility] -pt on Abilify and  Lexapro, given Vistaril recently but states she hasn't started it because she is nervous about taking another medication. pt reports she is very anxious, her niece died recently, she has been sick, her BP is high today. Advised taking a lower dose of the Hydroxyzine, sending 10mg  dose pills, f/u 1 mo. ? ?  ?  ? Relevant Medications  ? hydrOXYzine (ATARAX) 10 MG tablet  ? ?Other Visit Diagnoses   ? ? Acute non-recurrent pansinusitis    -  Primary ?seen in ER recently and started on Cefdinir, states her facial pain and ear pain is  better, but still having a lot of sinus sx, she never started the Flonase or allergy pill that they gave her. Advised she needs to start the Flonase, reviewed how to use & SE, start bid x1w, then reduce to qd, take oral antihistamine if sx are still not better. Continue & finish abt.  ? ?  ? ?Outpatient Encounter Medications as of 02/02/2022  ?Medication Sig  ? ARIPiprazole (ABILIFY) 20 MG tablet Take 1 tablet (20 mg total) by mouth daily.  ? cefdinir (OMNICEF) 300 MG capsule Take 1 capsule (300 mg total) by mouth 2 (two) times daily for 10 days.  ? cetirizine (ZYRTEC ALLERGY) 10 MG tablet Take 1 tablet (10 mg total) by mouth at bedtime.  ? escitalopram (LEXAPRO) 10 MG tablet Take 1 tablet (10 mg total) by mouth daily.  ? escitalopram (LEXAPRO) 5 MG tablet Take 1 tablet (5 mg total) by mouth daily.  ? fluticasone (FLONASE) 50 MCG/ACT nasal spray Place 1 spray into both nostrils daily. Begin by using 2 sprays in each nare daily for 3 to 5 days, then decrease to 1 spray in each nare daily.  ? hydrOXYzine (ATARAX) 10 MG tablet Take 1 tablet (10 mg total) by mouth 3 (three) times daily as needed.  ? ibuprofen (ADVIL) 800 MG tablet Take 1 tablet (800 mg total) by mouth every 8 (eight) hours as needed for up to 21 doses for fever, headache, mild pain or moderate pain.  ? [DISCONTINUED] hydrOXYzine (VISTARIL) 25 MG capsule Take 1 capsule (25 mg total) by mouth 3 (three) times daily as needed for  anxiety.  ? [DISCONTINUED] meclizine (ANTIVERT) 25 MG tablet Take 1 tablet (25 mg total) by mouth 3 (three) times daily as needed for dizziness.  ? ?No facility-administered encounter medications on file as of 02/02/2022.

## 2022-02-04 ENCOUNTER — Encounter: Payer: Self-pay | Admitting: Family

## 2022-02-04 NOTE — Assessment & Plan Note (Signed)
Chronic - pt not taking any medication currently, reports having increased anxiety currently, her niece died recently and still battling a sinus infection. pt to return in 1 mo, will start treatment. ?

## 2022-02-04 NOTE — Assessment & Plan Note (Addendum)
Chronic -followed by [facility] -pt on Abilify and Lexapro, given Vistaril recently but states she hasn't started it because she is nervous about taking another medication. pt reports she is very anxious, her niece died recently, she has been sick, her BP is high today. Advised taking a lower dose of the Hydroxyzine, sending 10mg  dose pills, f/u 1 mo. ?

## 2022-02-05 ENCOUNTER — Ambulatory Visit: Payer: Medicare Other | Admitting: Family

## 2022-02-16 ENCOUNTER — Encounter: Payer: Self-pay | Admitting: Family

## 2022-02-16 ENCOUNTER — Ambulatory Visit (INDEPENDENT_AMBULATORY_CARE_PROVIDER_SITE_OTHER): Payer: Medicare Other | Admitting: Family

## 2022-02-16 VITALS — BP 161/88 | HR 98 | Temp 98.4°F | Ht 65.0 in | Wt 337.5 lb

## 2022-02-16 DIAGNOSIS — I10 Essential (primary) hypertension: Secondary | ICD-10-CM | POA: Diagnosis not present

## 2022-02-16 DIAGNOSIS — R053 Chronic cough: Secondary | ICD-10-CM

## 2022-02-16 NOTE — Progress Notes (Signed)
? ?Subjective:  ? ? ? Patient ID: Rebekah Davis, female    DOB: 10/10/1986, 35 y.o.   MRN: [id number] ? ?Chief Complaint  ?Patient presents with  ? Cough  ?  Pt c/o cough, fever and diarrhea for about 5/3. Has tried mucinex, vitamin C which did help a little. Covid negative yesterday.   ? ?HPI: ?Persistent cough:  Cough: Patient complains of nonproductive cough.  Symptoms began 2 days ago.  The cough is non-productive, without wheezing, dyspnea or hemoptysis and is aggravated by infection and pollens Associated symptoms include:postnasal drip. Patient does not have a history of asthma. Patient does have a history of environmental allergens.  Patient does have a history of smoking.  ?Hypertension: Patient is currently maintained on the following medications for blood pressure: None ?Failed meds include: none ?Patient reports good compliance with blood pressure medications. ?Patient denies chest pain, headaches, shortness of breath or swelling. ?Last 3 blood pressure readings in our office are as follows: ?BP Readings from Last 3 Encounters:  ?02/16/22 (!) 161/88  ?02/02/22 (!) 152/73  ?02/01/22 (!) 164/101  ? ?Assessment & Plan:  ? ?Problem List Items Addressed This Visit   ? ?  ? Cardiovascular and Mediastinum  ? Essential hypertension  ?  BP high again today, pt having new viral/allergy symptoms with cough. But having consistently high numbers, pt very obese, need to start medication. Sending Olmesartan 20mg , advised on use & SE. ? ?  ?  ? Relevant Medications  ? olmesartan (BENICAR) 20 MG tablet  ? ?Other Visit Diagnoses   ? ? Persistent cough    -  Primary ?sx from sinus infection had mostly resolved, but started with cough, low grade fever, and mild diarrhea 2 days ago. pt never started Flonase nasal spray. Advised pt her lungs are clear, she has some postnasal drip and recommend she start the Flonase, advised on use and to just use 1x/day if it makes her nervousl She also should be taking the zyrtec daily, which  she reports taking off & on. Drink at least 2L water/day. OK to try OTC Imodium AD if diarrhea not resolving in another day or 2.  ? ?  ? ? ?Outpatient Medications Prior to Visit  ?Medication Sig Dispense Refill  ? ARIPiprazole (ABILIFY) 20 MG tablet Take 1 tablet (20 mg total) by mouth daily. 30 tablet 11  ? cetirizine (ZYRTEC ALLERGY) 10 MG tablet Take 1 tablet (10 mg total) by mouth at bedtime. 30 tablet 2  ? escitalopram (LEXAPRO) 10 MG tablet Take 1 tablet (10 mg total) by mouth daily. 30 tablet 11  ? escitalopram (LEXAPRO) 5 MG tablet Take 1 tablet (5 mg total) by mouth daily. 30 tablet 11  ? fluticasone (FLONASE) 50 MCG/ACT nasal spray Place 1 spray into both nostrils daily. Begin by using 2 sprays in each nare daily for 3 to 5 days, then decrease to 1 spray in each nare daily. 32 mL 1  ? hydrOXYzine (ATARAX) 10 MG tablet Take 1 tablet (10 mg total) by mouth 3 (three) times daily as needed. 30 tablet 0  ? ibuprofen (ADVIL) 800 MG tablet Take 1 tablet (800 mg total) by mouth every 8 (eight) hours as needed for up to 21 doses for fever, headache, mild pain or moderate pain. 21 tablet 0  ? ?No facility-administered medications prior to visit.  ? ? ?Past Medical History:  ?Diagnosis Date  ? Abdominal pain, suprapubic 06/04/2016  ? Acute cystitis 03/10/2018  ? Anxiety   ?  Arthritis   ? C. difficile diarrhea 06/12/2018  ? Cataract   ? Chest pain 03/28/2018  ? Depression   ? Depression with suicidal ideation 03/09/2018  ? Diarrhea 06/13/2018  ? GERD (gastroesophageal reflux disease)   ? History of kidney stones   ? Hypomagnesemia 06/13/2018  ? Low back pain 07/22/2012  ? Migraine   ? Non-suicidal self harm as coping mechanism (HCC) 12/25/2019  ? Oral thrush 06/12/2018  ? PCOS (polycystic ovarian syndrome)   ? PTSD (post-traumatic stress disorder)   ? Sleep apnea   ? Substance abuse (HCC)   ? Suicidal ideation 04/12/2018  ? ? ?Past Surgical History:  ?Procedure Laterality Date  ? TENOLYSIS Left 10/04/2020  ? Procedure: LEFT  ACHILLES TENDON DEBRIDEMENT AND RECONSTRUCTION, CALCANEAL EXOSTECTOMY, GASTROCNEMIUS RECESSION,  FLEXOR HALLUCIS LONGUS TRANSFER;  Surgeon: Terance Hart, MD;  Location: [facility] OR;  Service: Orthopedics;  Laterality: Left;  LENGTH OF SURGERY: 1.5 HOURS  ? ? ?Allergies  ?Allergen Reactions  ? Adhesive [Tape] Other (See Comments)  ?  blister  ? Cherry Anaphylaxis and Rash  ? Other Hives and Rash  ? Sulfamethoxazole-Trimethoprim Hives  ? Levofloxacin   ?  Other reaction(s): Muscle Pain, Other (See Comments)  ? Morphine   ?  Other reaction(s): Other (See Comments) ?Feels like unable to breath or swallow ?  ? Gabapentin Rash  ? Metformin Nausea And Vomiting  ?  Other reaction(s): Other (See Comments) ?diaphoresis ?  ? Sumatriptan Nausea And Vomiting  ?  Other reaction(s): Other (See Comments) ?Decreased heart rate and respiratory rate ?Decreased heart rate and respiratory rate ?  ? ? ?   ?Objective:  ?  ?Physical Exam ?Vitals and nursing note reviewed.  ?Constitutional:   ?   Appearance: Normal appearance. She is obese.  ?HENT:  ?   Right Ear: Tympanic membrane and ear canal normal.  ?   Left Ear: Tympanic membrane and ear canal normal.  ?   Mouth/Throat:  ?   Mouth: Mucous membranes are moist.  ?   Pharynx: Oropharyngeal exudate (mild) present. No pharyngeal swelling, posterior oropharyngeal erythema or uvula swelling.  ?Cardiovascular:  ?   Rate and Rhythm: Normal rate and regular rhythm.  ?Pulmonary:  ?   Effort: Pulmonary effort is normal.  ?   Breath sounds: Normal breath sounds.  ?Musculoskeletal:     ?   General: Normal range of motion.  ?Skin: ?   General: Skin is warm and dry.  ?Neurological:  ?   Mental Status: She is alert.  ?Psychiatric:     ?   Mood and Affect: Mood normal.     ?   Behavior: Behavior normal.  ? ? ?BP (!) 161/88 (BP Location: Left Arm, Patient Position: Sitting, Cuff Size: Large)   Pulse 98   Temp 98.4 ?F (36.9 ?C) (Temporal)   Ht 5\' 5"  (1.651 m)   Wt (!) 337 lb 8 oz (153.1 kg)   LMP  02/12/2022 (Exact Date)   SpO2 100%   BMI 56.16 kg/m?  ?Wt Readings from Last 3 Encounters:  ?02/16/22 (!) 337 lb 8 oz (153.1 kg)  ?02/02/22 (!) 335 lb (152 kg)  ?11/13/21 (!) 316 lb (143.3 kg)  ? ?   ? ?11/15/21, NP ? ?

## 2022-02-16 NOTE — Assessment & Plan Note (Signed)
BP high again today, pt having new viral/allergy symptoms with cough. But having consistently high numbers, pt very obese, need to start medication. Sending Olmesartan 20mg , advised on use & SE. ?

## 2022-02-18 ENCOUNTER — Encounter: Payer: Self-pay | Admitting: Family

## 2022-02-18 MED ORDER — OLMESARTAN MEDOXOMIL 20 MG PO TABS: 20.0000 mg | ORAL_TABLET | Freq: Every day | ORAL | 2 refills | Status: DC

## 2022-02-26 ENCOUNTER — Telehealth: Payer: Self-pay

## 2022-02-26 NOTE — Telephone Encounter (Signed)
I called and spoke with t in regards to new BP medication being sent in. Pt stated she will start it on Wednesday. Pt gave a verbal understanding.  ?

## 2022-03-05 ENCOUNTER — Ambulatory Visit: Payer: Medicare Other | Admitting: Family

## 2022-03-08 ENCOUNTER — Emergency Department (HOSPITAL_BASED_OUTPATIENT_CLINIC_OR_DEPARTMENT_OTHER)
Admission: EM | Admit: 2022-03-08 | Discharge: 2022-03-08 | Disposition: A | Discharge status: Home / Self Care | Payer: Managed Care, Other (non HMO) | Attending: Emergency Medicine | Admitting: Emergency Medicine

## 2022-03-08 ENCOUNTER — Other Ambulatory Visit: Payer: Self-pay

## 2022-03-08 ENCOUNTER — Emergency Department (HOSPITAL_BASED_OUTPATIENT_CLINIC_OR_DEPARTMENT_OTHER): Payer: Managed Care, Other (non HMO)

## 2022-03-08 ENCOUNTER — Encounter (HOSPITAL_BASED_OUTPATIENT_CLINIC_OR_DEPARTMENT_OTHER): Payer: Self-pay | Admitting: Emergency Medicine

## 2022-03-08 DIAGNOSIS — I10 Essential (primary) hypertension: Secondary | ICD-10-CM | POA: Diagnosis not present

## 2022-03-08 DIAGNOSIS — M5441 Lumbago with sciatica, right side: Secondary | ICD-10-CM | POA: Insufficient documentation

## 2022-03-08 DIAGNOSIS — Y990 Civilian activity done for income or pay: Secondary | ICD-10-CM | POA: Diagnosis not present

## 2022-03-08 DIAGNOSIS — X500XXA Overexertion from strenuous movement or load, initial encounter: Secondary | ICD-10-CM | POA: Insufficient documentation

## 2022-03-08 DIAGNOSIS — M545 Low back pain, unspecified: Secondary | ICD-10-CM | POA: Diagnosis present

## 2022-03-08 LAB — PREGNANCY, URINE: Preg Test, Ur: NEGATIVE

## 2022-03-08 MED ORDER — ACETAMINOPHEN 325 MG PO TABS
650.0000 mg | ORAL_TABLET | Freq: Once | ORAL | Status: AC
  Administered 2022-03-08: 650 mg via ORAL
  Filled 2022-03-08: qty 2

## 2022-03-08 MED ORDER — METHOCARBAMOL 500 MG PO TABS: 500.0000 mg | ORAL_TABLET | Freq: Two times a day (BID) | ORAL | 0 refills | Status: DC
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
## 2022-03-08 NOTE — Discharge Instructions (Addendum)
Your CT report: IMPRESSION:  1. No evidence of acute fracture or traumatic malalignment.  2. Multilevel degenerative change, greatest at L5-S1 where there is  moderate degenerative disc disease, facet arthropathy, and foraminal  narrowing. An MRI could better evaluate the foramina if clinically  indicated.      Based on your lack of neurologic symptoms I would hold off on an MRI at this point.  However if your symptoms worsen, especially if you have groin numbness, tingling, leg weakness, or worsening back pain especially in the context of a new injury I recommend that you return, to [facility] or [facility] for further evaluation and likely MRI imaging.  In the meantime, Please use Tylenol or ibuprofen for pain.  You may use 600 mg ibuprofen every 6 hours or 1000 mg of Tylenol every 6 hours.  You may choose to alternate between the 2.  This would be most effective.  Not to exceed 4 g of Tylenol within 24 hours.  Not to exceed 3200 mg ibuprofen 24 hours.  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  I attached some rehabilitation exercises above as well.  If your pain persists without significant worsening I recommend that you follow-up with orthopedics.

## 2022-03-13 ENCOUNTER — Other Ambulatory Visit: Payer: Self-pay | Admitting: Family

## 2022-03-19 ENCOUNTER — Telehealth: Payer: Self-pay | Admitting: Family

## 2022-03-19 ENCOUNTER — Other Ambulatory Visit: Payer: Self-pay

## 2022-03-19 MED ORDER — ESCITALOPRAM OXALATE 5 MG PO TABS: 5.0000 mg | ORAL_TABLET | Freq: Every day | ORAL | 2 refills | Status: DC

## 2022-03-19 MED ORDER — ESCITALOPRAM OXALATE 10 MG PO TABS: 10.0000 mg | ORAL_TABLET | Freq: Every day | ORAL | 2 refills | Status: DC

## 2022-03-19 NOTE — Telephone Encounter (Signed)
Patient needs a refill for  escitalopram (LEXAPRO) 10 MG tablet (Expired) and escitalopram (LEXAPRO) 5 MG tablet (Expired)  Patient stated she has her specialist appointment for the 12 of this month and they cannot see her sooner then that - she is asking for a refill to hold her until then - June 12th.-    Confirmed [facility]- On File

## 2022-03-19 NOTE — Telephone Encounter (Signed)
Rx sent. Lvm for pt

## 2022-03-30 ENCOUNTER — Emergency Department (HOSPITAL_COMMUNITY)
Admission: EM | Admit: 2022-03-30 | Discharge: 2022-03-30 | Disposition: A | Discharge status: Home / Self Care | Payer: Managed Care, Other (non HMO) | Attending: Emergency Medicine | Admitting: Emergency Medicine

## 2022-03-30 ENCOUNTER — Other Ambulatory Visit: Payer: Self-pay

## 2022-03-30 ENCOUNTER — Encounter (HOSPITAL_COMMUNITY): Payer: Self-pay

## 2022-03-30 ENCOUNTER — Emergency Department (HOSPITAL_COMMUNITY): Payer: Managed Care, Other (non HMO)

## 2022-03-30 DIAGNOSIS — M5441 Lumbago with sciatica, right side: Secondary | ICD-10-CM | POA: Diagnosis not present

## 2022-03-30 DIAGNOSIS — G834 Cauda equina syndrome: Secondary | ICD-10-CM | POA: Insufficient documentation

## 2022-03-30 DIAGNOSIS — M545 Low back pain, unspecified: Secondary | ICD-10-CM | POA: Diagnosis present

## 2022-03-30 MED ORDER — PREDNISONE 20 MG PO TABS: 20.0000 mg | ORAL_TABLET | Freq: Two times a day (BID) | ORAL | 0 refills | Status: DC

## 2022-03-30 NOTE — ED Notes (Addendum)
Pt provided [place] sandwhich, graham crackers, pb and sprite with provider approval
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
## 2022-03-30 NOTE — ED Triage Notes (Signed)
Reports lower back pain and pressure.  Reports pain to right leg and inner right thigh.  Reports difficulty urinating.

## 2022-03-30 NOTE — ED Notes (Signed)
Patient transported to MRI 

## 2022-03-30 NOTE — ED Notes (Signed)
Pt states she has high medical anxiety and would find it helpful if we check vitals periodically rather than her being hooked up and dinging a bunch.  I agreed to that for now.

## 2022-03-30 NOTE — Discharge Instructions (Addendum)
There were no serious problems found on the MRI imaging.  You do have some arthritis of the lower back, which should improve with time, and anti-inflammatory like prednisone.  Also, use heat and sore area 3-4 times a day.  Call Dr. Luiz Blare for follow-up appointment for further care and treatment as needed.

## 2022-03-30 NOTE — ED Notes (Signed)
Pt states she might be a little claustrophobic if MRI is necessary. She states she has had one before and did not require medication.  May be helpful to have a PRN order just in case.

## 2022-04-03 ENCOUNTER — Ambulatory Visit: Payer: Medicare Other | Attending: Orthopedic Surgery

## 2022-04-03 DIAGNOSIS — R2689 Other abnormalities of gait and mobility: Secondary | ICD-10-CM | POA: Insufficient documentation

## 2022-04-03 DIAGNOSIS — M5459 Other low back pain: Secondary | ICD-10-CM | POA: Diagnosis present

## 2022-04-03 DIAGNOSIS — M6281 Muscle weakness (generalized): Secondary | ICD-10-CM | POA: Insufficient documentation
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
## 2022-04-18 ENCOUNTER — Ambulatory Visit: Payer: Managed Care, Other (non HMO) | Attending: Orthopedic Surgery

## 2022-04-18 DIAGNOSIS — M5459 Other low back pain: Secondary | ICD-10-CM | POA: Insufficient documentation

## 2022-04-18 DIAGNOSIS — M6281 Muscle weakness (generalized): Secondary | ICD-10-CM | POA: Diagnosis present

## 2022-04-18 NOTE — Therapy (Signed)
OUTPATIENT PHYSICAL THERAPY TREATMENT NOTE   Patient Name: Rebekah Davis MRN: [id number] DOB:April 12, 1986, 36 y.o., female Today's Date: 04/18/2022  PCP: Dulce Sellar, NP REFERRING PROVIDER: Jodi Geralds, MD  END OF SESSION:   PT End of Session - 04/18/22 1812     Visit Number 2    Number of Visits 17    Date for PT Re-Evaluation 05/30/22    Authorization Type Med Pay    PT Start Time 1815    PT Stop Time 1857    PT Time Calculation (min) 42 min    Activity Tolerance Patient tolerated treatment well    Behavior During Therapy [facility] for tasks assessed/performed             Past Medical History:  Diagnosis Date   Abdominal pain, suprapubic 06/04/2016   Acute cystitis 03/10/2018   Anxiety    Arthritis    C. difficile diarrhea 06/12/2018   Cataract    Chest pain 03/28/2018   Depression    Depression with suicidal ideation 03/09/2018   Diarrhea 06/13/2018   GERD (gastroesophageal reflux disease)    History of kidney stones    Hypomagnesemia 06/13/2018   Low back pain 07/22/2012   Migraine    Non-suicidal self harm as coping mechanism ([facility]) 12/25/2019   Oral thrush 06/12/2018   PCOS (polycystic ovarian syndrome)    PTSD (post-traumatic stress disorder)    Sleep apnea    Substance abuse ([facility])    Suicidal ideation 04/12/2018   Past Surgical History:  Procedure Laterality Date   TENOLYSIS Left 10/04/2020   Procedure: LEFT ACHILLES TENDON DEBRIDEMENT AND RECONSTRUCTION, CALCANEAL EXOSTECTOMY, GASTROCNEMIUS RECESSION,  FLEXOR HALLUCIS LONGUS TRANSFER;  Surgeon: Terance Hart, MD;  Location: [facility];  Service: Orthopedics;  Laterality: Left;  LENGTH OF SURGERY: 1.5 HOURS   Patient Active Problem List   Diagnosis Date Noted   Passive suicidal ideations 11/16/2020   Irregular bleeding 04/12/2020   Achilles tendinitis 03/11/2020   Ovarian cyst 02/19/2020   Deliberate self-cutting 01/23/2020   Transaminitis 06/13/2018   Obesity 03/28/2018   OSA (obstructive sleep apnea)  03/28/2018   GERD (gastroesophageal reflux disease) 03/10/2018   PTSD (post-traumatic stress disorder) 03/09/2018   MDD (major depressive disorder), recurrent severe, 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/2018   Polycystic disease, ovaries 07/05/2016   Spondylisthesis 04/20/2015   Herniated lumbar intervertebral disc 02/28/2013   Sciatica of left side 02/28/2013   Borderline personality disorder ([facility]) 12/29/2012   Anxiety 12/24/2012   Essential hypertension 12/24/2012    REFERRING DIAG: M54.50 (ICD-10-CM) - Low back pain  THERAPY DIAG:  Other low back pain  Muscle weakness (generalized)  Rationale for Evaluation and Treatment Rehabilitation  PERTINENT HISTORY:  Anxiety, Depression, PTSD  PRECAUTIONS: None  SUBJECTIVE:  Pt presents to PT with continued reports of back pain. She has been compliant with HEP with no adverse effect. Pt is ready to begin PT at this time.   PAIN:  Are you having pain?  Yes: NPRS scale: 7/10 (9/10 at worst) Pain location: lower back Pain description: pressure, sharp, numbness Aggravating factors: prolonged sitting, prolonged standing Relieving factors: flexion, walking (short distance), rest   OBJECTIVE: (objective measures completed at initial evaluation unless otherwise dated)  PATIENT SURVEYS:  FOTO: 30% function; 51% predicted   COGNITION:           Overall cognitive status: Within functional limits for tasks assessed  SENSATION: Light touch: decreased in L lateral thigh   POSTURE:            Large body habitus, increased lordosis   PALPATION: TTP to lumbar paraspinals, L gluteal pain   LUMBAR ROM:    Active  A/PROM  eval  Flexion Decreased p!  Extension Increased p!   (Blank rows = not tested)   LOWER EXTREMITY MMT:  Myotomes intact   MMT Right 04/04/2022  Left 04/04/2022   Hip flexion       Hip extension      Hip abduction      Hip adduction      Hip external rotation      Hip internal rotation       Knee extension      Knee flexion      Ankle dorsiflexion       Ankle plantarflexion      Ankle inversion      Ankle eversion      Grossly 4/5 4/5  (Blank rows = not tested)      LUMBAR SPECIAL TESTS:  Straight leg raise test: Positive and Slump test: Positive   FUNCTIONAL TESTS:  30 Second Sit to Stand: 9 reps   GAIT: Distance walked: 97ft Assistive device utilized: None Level of assistance: Complete Independence Comments: antalgic gait L   TODAY'S TREATMENT  [facility] Adult PT Treatment:                                                DATE: 04/18/2022 Therapeutic Exercise: NuStep lvl 5 UE/LE x 5 min while taking subjective Supine PPT x 10 - 5" hold Supine PPT with ball 2x10 - 5" hold Supine PPT 2x10 with march GTB STS 2x10 - no UE support Seated sciatic nerve glide L x 20 Supine SLR 2x10 each LTR x 10 Supine clamshell 2x15 GTB LAQ 2x10 4#  [facility] Adult PT Treatment:                                                DATE: 04/04/2022 Therapeutic Exercise: Seated sciatic nerve glide L x 10 Supine PPT x 10 - 5" hold Supine SLR x 5 each LTR x 10   PATIENT EDUCATION:  Education details: eval findings, FOTO, HEP, POC Person educated: Patient Education method: Explanation, Demonstration, and Handouts Education comprehension: verbalized understanding and returned demonstration     HOME EXERCISE PROGRAM: Access Code: [email or web address] URL: [email or web address] Date: 04/03/2022 Prepared by: Edwinna Areola   Exercises - Seated Sciatic Tensioner  - 3 x daily - 7 x weekly - 2 sets - 10 reps - Supine Posterior Pelvic Tilt  - 2 x daily - 7 x weekly - 3 sets - 10 reps - 5 sec hold - Active Straight Leg Raise with Quad Set  - 2 x daily - 7 x weekly - 2 sets - 10 reps - Supine Lower Trunk Rotation  - 2 x daily - 7 x weekly - 2 sets - 10 reps - 5 sec hold   ASSESSMENT:   CLINICAL IMPRESSION: Pt was able to complete all prescribed exercises with no adverse effect or increase in  pain. Therapy focused on improving core and proximal hip strength in order to  decrease pain and improve function. Pt continues to benefit from skilled PT services and will continue to be seen and progressed as able per POC.      OBJECTIVE IMPAIRMENTS Abnormal gait, decreased activity tolerance, decreased endurance, decreased mobility, difficulty walking, decreased ROM, decreased strength, postural dysfunction, and pain.    ACTIVITY LIMITATIONS carrying, lifting, standing, squatting, stairs, transfers, toileting, and dressing   PARTICIPATION LIMITATIONS: cleaning, community activity, and occupation   PERSONAL FACTORS Fitness, Time since onset of injury/illness/exacerbation, and 3+ comorbidities: Anxiety, Depression, PTSD  are also affecting patient's functional outcome.     GOALS: Goals reviewed with patient? Yes   SHORT TERM GOALS: Target date: 04/25/2022   Pt will be compliant and knowledgeable with initial HEP for improved comfort and carryover Baseline: initial HEP given  Goal status: INITIAL   2.  Pt will self report lower back and left LE pain no greater than 6/10 for improved comfort and functional ability Baseline: 10/10 at worst Goal status: INITIAL   LONG TERM GOALS: Target date: 05/30/2022   Pt will improve FOTO function score to no less than 51% as proxy for functional improvement Baseline: 30% function Goal status: INITIAL   2.  Pt will increase 30 Second Sit to Stand rep count to no less than 12 reps for improved balance, strength, and functional mobility Baseline: 9 reps  Goal status: INITIAL   3.  Pt will self report lower back and left LE pain no greater than 3/10 for improved comfort and functional ability Baseline: 9/10 at worst Goal status: INITIAL   4.  Pt will be able to lift 25# KB from floor to chest height with no increase in LBP for improved comfort with ADLs and work  Baseline: unable  Goal status: INITIAL   5.  Pt will be show improved comfort with  lumbar ROM in order to perform hygiene and toileting care with decrease pain Baseline: unable without pain Goal status: INITIAL     PLAN: PT FREQUENCY: 2x/week   PT DURATION: 8 weeks   PLANNED INTERVENTIONS: Therapeutic exercises, Therapeutic activity, Neuromuscular re-education, Balance training, Gait training, Patient/Family education, Joint mobilization, Aquatic Therapy, Dry Needling, Electrical stimulation, Cryotherapy, Moist heat, Manual therapy, and Re-evaluation.   PLAN FOR NEXT SESSION: assess HEP response, neutral spine core strengthening, proximal hip strenghtening    Eloy End, PT 04/18/2022, 7:03 PM

## 2022-04-19 ENCOUNTER — Ambulatory Visit: Payer: Managed Care, Other (non HMO)

## 2022-04-24 ENCOUNTER — Ambulatory Visit: Payer: Managed Care, Other (non HMO)

## 2022-04-26 ENCOUNTER — Ambulatory Visit: Payer: Managed Care, Other (non HMO)

## 2022-05-01 ENCOUNTER — Ambulatory Visit: Payer: Managed Care, Other (non HMO)

## 2022-05-01 DIAGNOSIS — M5459 Other low back pain: Secondary | ICD-10-CM

## 2022-05-01 DIAGNOSIS — M6281 Muscle weakness (generalized): Secondary | ICD-10-CM

## 2022-05-01 NOTE — Therapy (Signed)
OUTPATIENT PHYSICAL THERAPY TREATMENT NOTE   Patient Name: Rebekah Davis MRN: [id number] DOB:09-11-1986, 36 y.o., female Today's Date: 05/01/2022  PCP: Jeanie Sewer, NP REFERRING PROVIDER: Dorna Leitz, MD  END OF SESSION:   PT End of Session - 05/01/22 1735     Visit Number 3    Number of Visits 17    Date for PT Re-Evaluation 05/30/22    Authorization Type Med Pay    PT Start Time 0312    PT Stop Time 1826    PT Time Calculation (min) 41 min    Activity Tolerance Patient tolerated treatment well    Behavior During Therapy [facility] for tasks assessed/performed              Past Medical History:  Diagnosis Date   Abdominal pain, suprapubic 06/04/2016   Acute cystitis 03/10/2018   Anxiety    Arthritis    C. difficile diarrhea 06/12/2018   Cataract    Chest pain 03/28/2018   Depression    Depression with suicidal ideation 03/09/2018   Diarrhea 06/13/2018   GERD (gastroesophageal reflux disease)    History of kidney stones    Hypomagnesemia 06/13/2018   Low back pain 07/22/2012   Migraine    Non-suicidal self harm as coping mechanism ([place]) 12/25/2019   Oral thrush 06/12/2018   PCOS (polycystic ovarian syndrome)    PTSD (post-traumatic stress disorder)    Sleep apnea    Substance abuse ([place])    Suicidal ideation 04/12/2018   Past Surgical History:  Procedure Laterality Date   TENOLYSIS Left 10/04/2020   Procedure: LEFT ACHILLES TENDON DEBRIDEMENT AND RECONSTRUCTION, CALCANEAL EXOSTECTOMY, GASTROCNEMIUS RECESSION,  FLEXOR HALLUCIS LONGUS TRANSFER;  Surgeon: Erle Crocker, MD;  Location: [place];  Service: Orthopedics;  Laterality: Left;  LENGTH OF SURGERY: 1.5 HOURS   Patient Active Problem List   Diagnosis Date Noted   Passive suicidal ideations 11/16/2020   Irregular bleeding 04/12/2020   Achilles tendinitis 03/11/2020   Ovarian cyst 02/19/2020   Deliberate self-cutting 01/23/2020   Transaminitis 06/13/2018   Obesity 03/28/2018   OSA (obstructive sleep  apnea) 03/28/2018   GERD (gastroesophageal reflux disease) 03/10/2018   PTSD (post-traumatic stress disorder) 03/09/2018   MDD (major depressive disorder), recurrent severe, without psychosis ([place]) 02/27/2018   Polycystic disease, ovaries 07/05/2016   Spondylisthesis 04/20/2015   Herniated lumbar intervertebral disc 02/28/2013   Sciatica of left side 02/28/2013   Borderline personality disorder ([place]) 12/29/2012   Anxiety 12/24/2012   Essential hypertension 12/24/2012    REFERRING DIAG: M54.50 (ICD-10-CM) - Low back pain  THERAPY DIAG:  Other low back pain  Muscle weakness (generalized)  Rationale for Evaluation and Treatment Rehabilitation  PERTINENT HISTORY:  Anxiety, Depression, PTSD  PRECAUTIONS: None  SUBJECTIVE: Patient reports stiffness in her back. She also reports that she is having pain in her achilles heel on R foot.   PAIN:  Are you having pain?  Yes: NPRS scale: 5/10 (9/10 at worst) Pain location: lower back Pain description: pressure, sharp, numbness Aggravating factors: prolonged sitting, prolonged standing Relieving factors: flexion, walking (short distance), rest   OBJECTIVE: (objective measures completed at initial evaluation unless otherwise dated)  PATIENT SURVEYS:  FOTO: 30% function; 51% predicted   COGNITION:           Overall cognitive status: Within functional limits for tasks assessed                          SENSATION: Light touch:  decreased in L lateral thigh   POSTURE:            Large body habitus, increased lordosis   PALPATION: TTP to lumbar paraspinals, L gluteal pain   LUMBAR ROM:    Active  A/PROM  eval  Flexion Decreased p!  Extension Increased p!   (Blank rows = not tested)   LOWER EXTREMITY MMT:  Myotomes intact   MMT Right 04/04/2022  Left 04/04/2022   Hip flexion       Hip extension      Hip abduction      Hip adduction      Hip external rotation      Hip internal rotation      Knee extension      Knee  flexion      Ankle dorsiflexion       Ankle plantarflexion      Ankle inversion      Ankle eversion      Grossly 4/5 4/5  (Blank rows = not tested)      LUMBAR SPECIAL TESTS:  Straight leg raise test: Positive and Slump test: Positive   FUNCTIONAL TESTS:  30 Second Sit to Stand: 9 reps   GAIT: Distance walked: 80f Assistive device utilized: None Level of assistance: Complete Independence Comments: antalgic gait L   TODAY'S TREATMENT  [facility] Adult PT Treatment:                                                DATE: 05/01/2022 Therapeutic Exercise: NuStep lvl 5 UE/LE x 5 min while taking subjective Supine PPT x 10 - 5" hold Supine PPT with ball x10 - 5" hold Supine sciatic nerve glide L x 20 Supine SLR 2x10 each Modified thomas stretch EOM x1' BIL Supine figure 4 piriformis stretch x30" BIL Self Care: Desk ergonomics, handout from [facility]    [facility]                                                DATE: 04/18/2022 Therapeutic Exercise: NuStep lvl 5 UE/LE x 5 min while taking subjective Supine PPT x 10 - 5" hold Supine PPT with ball 2x10 - 5" hold Supine PPT 2x10 with march GTB STS 2x10 - no UE support Seated sciatic nerve glide L x 20 Supine SLR 2x10 each LTR x 10 Supine clamshell 2x15 GTB LAQ 2x10 4#  [facility] Adult PT Treatment:                                                DATE: 04/04/2022 Therapeutic Exercise: Seated sciatic nerve glide L x 10 Supine PPT x 10 - 5" hold Supine SLR x 5 each LTR x 10   PATIENT EDUCATION:  Education details: eval findings, FOTO, HEP, POC Person educated: Patient Education method: Explanation, Demonstration, and Handouts Education comprehension: verbalized understanding and returned demonstration     HOME EXERCISE PROGRAM: Access Code: [email or web address] URL: [email or web address] Date: 04/03/2022 Prepared by: DOctavio Manns  Exercises - Seated Sciatic Tensioner  - 3 x daily - 7 x weekly -  2  sets - 10 reps - Supine Posterior Pelvic Tilt  - 2 x daily - 7 x weekly - 3 sets - 10 reps - 5 sec hold - Active Straight Leg Raise with Quad Set  - 2 x daily - 7 x weekly - 2 sets - 10 reps - Supine Lower Trunk Rotation  - 2 x daily - 7 x weekly - 2 sets - 10 reps - 5 sec hold   ASSESSMENT:   CLINICAL IMPRESSION: Patient presents to PT with continued LBP and reports HEP compliance. Session today focused on core and proximal hip strengthening as well as self care discussion of desk ergonomics and sleep habits. Patient was able to tolerate all prescribed exercises with no adverse effects. Patient continues to benefit from skilled PT services and should be progressed as able to improve functional independence.    OBJECTIVE IMPAIRMENTS Abnormal gait, decreased activity tolerance, decreased endurance, decreased mobility, difficulty walking, decreased ROM, decreased strength, postural dysfunction, and pain.    ACTIVITY LIMITATIONS carrying, lifting, standing, squatting, stairs, transfers, toileting, and dressing   PARTICIPATION LIMITATIONS: cleaning, community activity, and occupation   [place], Time since onset of injury/illness/exacerbation, and 3+ comorbidities: Anxiety, Depression, PTSD  are also affecting patient's functional outcome.     GOALS: Goals reviewed with patient? Yes   SHORT TERM GOALS: Target date: 04/25/2022   Pt will be compliant and knowledgeable with initial HEP for improved comfort and carryover Baseline: initial HEP given  Goal status: MET Pt reports daily adherence 05/01/2022   2.  Pt will self report lower back and left LE pain no greater than 6/10 for improved comfort and functional ability Baseline: 10/10 at worst Goal status: Ongoing   LONG TERM GOALS: Target date: 05/30/2022   Pt will improve FOTO function score to no less than 51% as proxy for functional improvement Baseline: 30% function Goal status: INITIAL   2.  Pt will increase 30  Second Sit to Stand rep count to no less than 12 reps for improved balance, strength, and functional mobility Baseline: 9 reps  Goal status: INITIAL   3.  Pt will self report lower back and left LE pain no greater than 3/10 for improved comfort and functional ability Baseline: 9/10 at worst Goal status: INITIAL   4.  Pt will be able to lift 25# KB from floor to chest height with no increase in LBP for improved comfort with ADLs and work  Baseline: unable  Goal status: INITIAL   5.  Pt will be show improved comfort with lumbar ROM in order to perform hygiene and toileting care with decrease pain Baseline: unable without pain Goal status: INITIAL     PLAN: PT FREQUENCY: 2x/week   PT DURATION: 8 weeks   PLANNED INTERVENTIONS: Therapeutic exercises, Therapeutic activity, Neuromuscular re-education, Balance training, Gait training, Patient/Family education, Joint mobilization, Aquatic Therapy, Dry Needling, Electrical stimulation, Cryotherapy, Moist heat, Manual therapy, and Re-evaluation.   PLAN FOR NEXT SESSION: assess HEP response, neutral spine core strengthening, proximal hip strenghtening    Evelene Croon, PTA 05/01/2022, 6:27 PM

## 2022-05-03 ENCOUNTER — Ambulatory Visit: Payer: Managed Care, Other (non HMO)

## 2022-05-08 ENCOUNTER — Ambulatory Visit: Payer: Managed Care, Other (non HMO)

## 2022-05-08 DIAGNOSIS — M6281 Muscle weakness (generalized): Secondary | ICD-10-CM

## 2022-05-08 DIAGNOSIS — M5459 Other low back pain: Secondary | ICD-10-CM | POA: Diagnosis not present

## 2022-05-08 NOTE — Therapy (Signed)
OUTPATIENT PHYSICAL THERAPY TREATMENT NOTE   Patient Name: Rebekah Davis MRN: [id number] DOB:[birth year], 36 y.o., female Today's Date: 05/08/2022  PCP: Jeanie Sewer, NP REFERRING PROVIDER: Dorna Leitz, MD  END OF SESSION:   PT End of Session - 05/08/22 1738     Visit Number 4    Number of Visits 17    Date for PT Re-Evaluation 05/30/22    Authorization Type Med Pay    PT Start Time 3235    PT Stop Time 5732    PT Time Calculation (min) 45 min    Activity Tolerance Patient tolerated treatment well    Behavior During Therapy [facility] for tasks assessed/performed               Past Medical History:  Diagnosis Date   Abdominal pain, suprapubic 06/04/2016   Acute cystitis 03/10/2018   Anxiety    Arthritis    C. difficile diarrhea 06/12/2018   Cataract    Chest pain 03/28/2018   Depression    Depression with suicidal ideation 03/09/2018   Diarrhea 06/13/2018   GERD (gastroesophageal reflux disease)    History of kidney stones    Hypomagnesemia 06/13/2018   Low back pain 07/22/2012   Migraine    Non-suicidal self harm as coping mechanism ([place]) 12/25/2019   Oral thrush 06/12/2018   PCOS (polycystic ovarian syndrome)    PTSD (post-traumatic stress disorder)    Sleep apnea    Substance abuse ([place])    Suicidal ideation 04/12/2018   Past Surgical History:  Procedure Laterality Date   TENOLYSIS Left 10/04/2020   Procedure: LEFT ACHILLES TENDON DEBRIDEMENT AND RECONSTRUCTION, CALCANEAL EXOSTECTOMY, GASTROCNEMIUS RECESSION,  FLEXOR HALLUCIS LONGUS TRANSFER;  Surgeon: Erle Crocker, MD;  Location: [place];  Service: Orthopedics;  Laterality: Left;  LENGTH OF SURGERY: 1.5 HOURS   Patient Active Problem List   Diagnosis Date Noted   Passive suicidal ideations 11/16/2020   Irregular bleeding 04/12/2020   Achilles tendinitis 03/11/2020   Ovarian cyst 02/19/2020   Deliberate self-cutting 01/23/2020   Transaminitis 06/13/2018   Obesity 03/28/2018   OSA (obstructive sleep  apnea) 03/28/2018   GERD (gastroesophageal reflux disease) 03/10/2018   PTSD (post-traumatic stress disorder) 03/09/2018   MDD (major depressive disorder), recurrent severe, without psychosis ([place]) 02/27/2018   Polycystic disease, ovaries 07/05/2016   Spondylisthesis 04/20/2015   Herniated lumbar intervertebral disc 02/28/2013   Sciatica of left side 02/28/2013   Borderline personality disorder ([place]) 12/29/2012   Anxiety 12/24/2012   Essential hypertension 12/24/2012    REFERRING DIAG: M54.50 (ICD-10-CM) - Low back pain  THERAPY DIAG:  Other low back pain  Muscle weakness (generalized)  Rationale for Evaluation and Treatment Rehabilitation  PERTINENT HISTORY:  Anxiety, Depression, PTSD  PRECAUTIONS: None  SUBJECTIVE: Patient reports more stiffness in her back today than pain.   PAIN:  Are you having pain?  Yes: NPRS scale: 6/10 (stiffness more so than pain) Pain location: lower back Pain description: pressure, sharp, numbness Aggravating factors: prolonged sitting, prolonged standing Relieving factors: flexion, walking (short distance), rest   OBJECTIVE: (objective measures completed at initial evaluation unless otherwise dated)  PATIENT SURVEYS:  FOTO: 30% function; 51% predicted   COGNITION:           Overall cognitive status: Within functional limits for tasks assessed                          SENSATION: Light touch: decreased in L lateral thigh   POSTURE:  Large body habitus, increased lordosis   PALPATION: TTP to lumbar paraspinals, L gluteal pain   LUMBAR ROM:    Active  A/PROM  eval  Flexion Decreased p!  Extension Increased p!   (Blank rows = not tested)   LOWER EXTREMITY MMT:  Myotomes intact   MMT Right 04/04/2022  Left 04/04/2022   Hip flexion       Hip extension      Hip abduction      Hip adduction      Hip external rotation      Hip internal rotation      Knee extension      Knee flexion      Ankle dorsiflexion        Ankle plantarflexion      Ankle inversion      Ankle eversion      Grossly 4/5 4/5  (Blank rows = not tested)      LUMBAR SPECIAL TESTS:  Straight leg raise test: Positive and Slump test: Positive   FUNCTIONAL TESTS:  30 Second Sit to Stand: 9 reps   GAIT: Distance walked: 87f Assistive device utilized: None Level of assistance: Complete Independence Comments: antalgic gait L   TODAY'S TREATMENT  [facility] Adult PT Treatment:                                                DATE: 05/08/2022 Therapeutic Exercise: NuStep lvl 6 UE/LE x 5 min while taking subjective Supine PPT x 10 - 5" hold Supine SLR 2x10 Rt; 1x10, 1x7 on Lt  Modified thomas stretch EOM x1' BIL Supine figure 4 piriformis stretch x30" BIL LTR x10 BIL Open books x10 BIL Seated sciatic nerve glide x20 Lt LAQ 2x10 4# Manual Therapy: Gentle lumbar traction with legs on red Pball 2x1'   [facility] Adult PT Treatment:                                                DATE: 05/01/2022 Therapeutic Exercise: NuStep lvl 5 UE/LE x 5 min while taking subjective Supine PPT x 10 - 5" hold Supine PPT with ball x10 - 5" hold Supine sciatic nerve glide L x 20 Supine SLR 2x10 each Modified thomas stretch EOM x1' BIL Supine figure 4 piriformis stretch x30" BIL Self Care: Desk ergonomics, handout from [facility]    [facility]                                                DATE: 04/18/2022 Therapeutic Exercise: NuStep lvl 5 UE/LE x 5 min while taking subjective Supine PPT x 10 - 5" hold Supine PPT with ball 2x10 - 5" hold Supine PPT 2x10 with march GTB STS 2x10 - no UE support Seated sciatic nerve glide L x 20 Supine SLR 2x10 each LTR x 10 Supine clamshell 2x15 GTB LAQ 2x10 4#    PATIENT EDUCATION:  Education details: eval findings, FOTO, HEP, POC Person educated: Patient Education method: Explanation, Demonstration, and Handouts Education comprehension: verbalized understanding and returned  demonstration     HOME EXERCISE  PROGRAM: Access Code: [email or web address] URL: [email or web address] Date: 04/03/2022 Prepared by: Octavio Manns   Exercises - Seated Sciatic Tensioner  - 3 x daily - 7 x weekly - 2 sets - 10 reps - Supine Posterior Pelvic Tilt  - 2 x daily - 7 x weekly - 3 sets - 10 reps - 5 sec hold - Active Straight Leg Raise with Quad Set  - 2 x daily - 7 x weekly - 2 sets - 10 reps - Supine Lower Trunk Rotation  - 2 x daily - 7 x weekly - 2 sets - 10 reps - 5 sec hold   ASSESSMENT:   CLINICAL IMPRESSION: Patient presents to PT with continued LBP but reports that it feels more like stiffness than pain. Session today continued to focus on core and proximal hip strengthening as well as lumbar mobility and traction for the lumbar spine to reduce tension. Patient was able to tolerate all prescribed exercises with no adverse effects. Patient continues to benefit from skilled PT services and should be progressed as able to improve functional independence.    OBJECTIVE IMPAIRMENTS Abnormal gait, decreased activity tolerance, decreased endurance, decreased mobility, difficulty walking, decreased ROM, decreased strength, postural dysfunction, and pain.    ACTIVITY LIMITATIONS carrying, lifting, standing, squatting, stairs, transfers, toileting, and dressing   PARTICIPATION LIMITATIONS: cleaning, community activity, and occupation   [place], Time since onset of injury/illness/exacerbation, and 3+ comorbidities: Anxiety, Depression, PTSD  are also affecting patient's functional outcome.     GOALS: Goals reviewed with patient? Yes   SHORT TERM GOALS: Target date: 04/25/2022   Pt will be compliant and knowledgeable with initial HEP for improved comfort and carryover Baseline: initial HEP given  Goal status: MET Pt reports daily adherence 05/01/2022   2.  Pt will self report lower back and left LE pain no greater than 6/10 for improved comfort and  functional ability Baseline: 10/10 at worst Goal status: Ongoing   LONG TERM GOALS: Target date: 05/30/2022   Pt will improve FOTO function score to no less than 51% as proxy for functional improvement Baseline: 30% function Goal status: INITIAL   2.  Pt will increase 30 Second Sit to Stand rep count to no less than 12 reps for improved balance, strength, and functional mobility Baseline: 9 reps  Goal status: INITIAL   3.  Pt will self report lower back and left LE pain no greater than 3/10 for improved comfort and functional ability Baseline: 9/10 at worst Goal status: INITIAL   4.  Pt will be able to lift 25# KB from floor to chest height with no increase in LBP for improved comfort with ADLs and work  Baseline: unable  Goal status: INITIAL   5.  Pt will be show improved comfort with lumbar ROM in order to perform hygiene and toileting care with decrease pain Baseline: unable without pain Goal status: INITIAL     PLAN: PT FREQUENCY: 2x/week   PT DURATION: 8 weeks   PLANNED INTERVENTIONS: Therapeutic exercises, Therapeutic activity, Neuromuscular re-education, Balance training, Gait training, Patient/Family education, Joint mobilization, Aquatic Therapy, Dry Needling, Electrical stimulation, Cryotherapy, Moist heat, Manual therapy, and Re-evaluation.   PLAN FOR NEXT SESSION: assess HEP response, neutral spine core strengthening, proximal hip strenghtening    Evelene Croon, PTA 05/08/2022, 6:25 PM

## 2022-05-10 ENCOUNTER — Ambulatory Visit: Payer: Managed Care, Other (non HMO)

## 2022-05-15 ENCOUNTER — Ambulatory Visit: Payer: Managed Care, Other (non HMO) | Attending: Orthopedic Surgery

## 2022-05-15 DIAGNOSIS — M5459 Other low back pain: Secondary | ICD-10-CM | POA: Insufficient documentation

## 2022-05-15 DIAGNOSIS — M6281 Muscle weakness (generalized): Secondary | ICD-10-CM | POA: Insufficient documentation

## 2022-05-15 DIAGNOSIS — R2689 Other abnormalities of gait and mobility: Secondary | ICD-10-CM | POA: Diagnosis present

## 2022-05-15 NOTE — Therapy (Signed)
OUTPATIENT PHYSICAL THERAPY TREATMENT NOTE   Patient Name: Rebekah Davis MRN: [id number] DOB:02/15/1986, 36 y.o., female Today's Date: 05/15/2022  PCP: Hudnell, , NP REFERRING PROVIDER: Graves, John, MD  END OF SESSION:   PT End of Session - 05/15/22 1742     Visit Number 5    Number of Visits 17    Date for PT Re-Evaluation 05/30/22    Authorization Type Med Pay    PT Start Time 1743    PT Stop Time 1821    PT Time Calculation (min) 38 min    Activity Tolerance Patient tolerated treatment well    Behavior During Therapy WFL for tasks assessed/performed                Past Medical History:  Diagnosis Date   Abdominal pain, suprapubic 06/04/2016   Acute cystitis 03/10/2018   Anxiety    Arthritis    C. difficile diarrhea 06/12/2018   Cataract    Chest pain 03/28/2018   Depression    Depression with suicidal ideation 03/09/2018   Diarrhea 06/13/2018   GERD (gastroesophageal reflux disease)    History of kidney stones    Hypomagnesemia 06/13/2018   Low back pain 07/22/2012   Migraine    Non-suicidal self harm as coping mechanism (HCC) 12/25/2019   Oral thrush 06/12/2018   PCOS (polycystic ovarian syndrome)    PTSD (post-traumatic stress disorder)    Sleep apnea    Substance abuse (HCC)    Suicidal ideation 04/12/2018   Past Surgical History:  Procedure Laterality Date   TENOLYSIS Left 10/04/2020   Procedure: LEFT ACHILLES TENDON DEBRIDEMENT AND RECONSTRUCTION, CALCANEAL EXOSTECTOMY, GASTROCNEMIUS RECESSION,  FLEXOR HALLUCIS LONGUS TRANSFER;  Surgeon: Adair, Christopher R, MD;  Location: [facility] OR;  Service: Orthopedics;  Laterality: Left;  LENGTH OF SURGERY: 1.5 HOURS   Patient Active Problem List   Diagnosis Date Noted   Passive suicidal ideations 11/16/2020   Irregular bleeding 04/12/2020   Achilles tendinitis 03/11/2020   Ovarian cyst 02/19/2020   Deliberate self-cutting 01/23/2020   Transaminitis 06/13/2018   Obesity 03/28/2018   OSA (obstructive sleep  apnea) 03/28/2018   GERD (gastroesophageal reflux disease) 03/10/2018   PTSD (post-traumatic stress disorder) 03/09/2018   MDD (major depressive disorder), recurrent severe, 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/2018   Polycystic disease, ovaries 07/05/2016   Spondylisthesis 04/20/2015   Herniated lumbar intervertebral disc 02/28/2013   Sciatica of left side 02/28/2013   Borderline personality disorder (HCC) 12/29/2012   Anxiety 12/24/2012   Essential hypertension 12/24/2012    REFERRING DIAG: M54.50 (ICD-10-CM) - Low back pain  THERAPY DIAG:  Other low back pain  Muscle weakness (generalized)  Rationale for Evaluation and Treatment Rehabilitation  PERTINENT HISTORY:  Anxiety, Depression, PTSD  PRECAUTIONS: None  SUBJECTIVE: Patient reports high pain at work today and took a tylenol before coming to PT.  PAIN:  Are you having pain?  Yes: NPRS scale: 4/10 (stiffness more so than pain) Pain location: lower back Pain description: pressure, sharp, numbness Aggravating factors: prolonged sitting, prolonged standing Relieving factors: flexion, walking (short distance), rest   OBJECTIVE: (objective measures completed at initial evaluation unless otherwise dated)  PATIENT SURVEYS:  FOTO: 30% function; 51% predicted   COGNITION:           Overall cognitive status: Within functional limits for tasks assessed                          SENSATION: Light touch: decreased in L   lateral thigh   POSTURE:            Large body habitus, increased lordosis   PALPATION: TTP to lumbar paraspinals, L gluteal pain   LUMBAR ROM:    Active  A/PROM  eval  Flexion Decreased p!  Extension Increased p!   (Blank rows = not tested)   LOWER EXTREMITY MMT:  Myotomes intact   MMT Right 04/04/2022  Left 04/04/2022   Hip flexion       Hip extension      Hip abduction      Hip adduction      Hip external rotation      Hip internal rotation      Knee extension      Knee flexion      Ankle  dorsiflexion       Ankle plantarflexion      Ankle inversion      Ankle eversion      Grossly 4/5 4/5  (Blank rows = not tested)      LUMBAR SPECIAL TESTS:  Straight leg raise test: Positive and Slump test: Positive   FUNCTIONAL TESTS:  30 Second Sit to Stand: 9 reps   GAIT: Distance walked: 41f Assistive device utilized: None Level of assistance: Complete Independence Comments: antalgic gait L   TODAY'S TREATMENT  [facility] Adult PT Treatment:                                                DATE: 05/15/2022 Therapeutic Exercise: NuStep lvl 6 LE x 5 min while taking subjective Palloff press 10# 2x10 BIL Chops with dowel 10# x10 Rt, 7# Lt x10  Supine PPT x 10 - 5" hold LTR x10 BIL Seated sciatic nerve glide x20 Lt Deadlift 5# KB from 14" box (cues for form)  [facility] Adult PT Treatment:                                                DATE: 05/08/2022 Therapeutic Exercise: NuStep lvl 6 UE/LE x 5 min while taking subjective Supine PPT x 10 - 5" hold Supine SLR 2x10 Rt; 1x10, 1x7 on Lt  Modified thomas stretch EOM x1' BIL Supine figure 4 piriformis stretch x30" BIL LTR x10 BIL Open books x10 BIL Seated sciatic nerve glide x20 Lt LAQ 2x10 4# Manual Therapy: Gentle lumbar traction with legs on red Pball 2x1'   [facility] Adult PT Treatment:                                                DATE: 05/01/2022 Therapeutic Exercise: NuStep lvl 5 UE/LE x 5 min while taking subjective Supine PPT x 10 - 5" hold Supine PPT with ball x10 - 5" hold Supine sciatic nerve glide L x 20 Supine SLR 2x10 each Modified thomas stretch EOM x1' BIL Supine figure 4 piriformis stretch x30" BIL Self Care: Desk ergonomics, handout from [employer]     PATIENT EDUCATION:  Education details: eval findings, FOTO, HEP, POC Person educated: Patient Education method: Explanation, Demonstration, and Handouts Education comprehension: verbalized understanding and returned demonstration  HOME  EXERCISE PROGRAM: Access Code: [email or web address] URL: [email or web address] Date: 04/03/2022 Prepared by: David Stroup   Exercises - Seated Sciatic Tensioner  - 3 x daily - 7 x weekly - 2 sets - 10 reps - Supine Posterior Pelvic Tilt  - 2 x daily - 7 x weekly - 3 sets - 10 reps - 5 sec hold - Active Straight Leg Raise with Quad Set  - 2 x daily - 7 x weekly - 2 sets - 10 reps - Supine Lower Trunk Rotation  - 2 x daily - 7 x weekly - 2 sets - 10 reps - 5 sec hold   ASSESSMENT:   CLINICAL IMPRESSION: Patient presents to PT with continued LBP and reports HEP compliance and that the exercises at home are getting easier. Session today focused on core and proximal hip strengthening with introduction of dead-lifting. She needed verbal cues to perform correctly, but was able to execute with no increase in pain. Patient was able to tolerate all prescribed exercises with no adverse effects. Patient continues to benefit from skilled PT services and should be progressed as able to improve functional independence.    OBJECTIVE IMPAIRMENTS Abnormal gait, decreased activity tolerance, decreased endurance, decreased mobility, difficulty walking, decreased ROM, decreased strength, postural dysfunction, and pain.    ACTIVITY LIMITATIONS carrying, lifting, standing, squatting, stairs, transfers, toileting, and dressing   PARTICIPATION LIMITATIONS: cleaning, community activity, and occupation   PERSONAL FACTORS Fitness, Time since onset of injury/illness/exacerbation, and 3+ comorbidities: Anxiety, Depression, PTSD  are also affecting patient's functional outcome.     GOALS: Goals reviewed with patient? Yes   SHORT TERM GOALS: Target date: 04/25/2022   Pt will be compliant and knowledgeable with initial HEP for improved comfort and carryover Baseline: initial HEP given  Goal status: MET Pt reports daily adherence 05/01/2022   2.  Pt will self report lower back and left LE pain no greater than 6/10  for improved comfort and functional ability Baseline: 10/10 at worst Goal status: Ongoing   LONG TERM GOALS: Target date: 05/30/2022   Pt will improve FOTO function score to no less than 51% as proxy for functional improvement Baseline: 30% function Goal status: INITIAL   2.  Pt will increase 30 Second Sit to Stand rep count to no less than 12 reps for improved balance, strength, and functional mobility Baseline: 9 reps  Goal status: INITIAL   3.  Pt will self report lower back and left LE pain no greater than 3/10 for improved comfort and functional ability Baseline: 9/10 at worst Goal status: INITIAL   4.  Pt will be able to lift 25# KB from floor to chest height with no increase in LBP for improved comfort with ADLs and work  Baseline: unable  Goal status: INITIAL   5.  Pt will be show improved comfort with lumbar ROM in order to perform hygiene and toileting care with decrease pain Baseline: unable without pain Goal status: INITIAL     PLAN: PT FREQUENCY: 2x/week   PT DURATION: 8 weeks   PLANNED INTERVENTIONS: Therapeutic exercises, Therapeutic activity, Neuromuscular re-education, Balance training, Gait training, Patient/Family education, Joint mobilization, Aquatic Therapy, Dry Needling, Electrical stimulation, Cryotherapy, Moist heat, Manual therapy, and Re-evaluation.   PLAN FOR NEXT SESSION: assess HEP response, neutral spine core strengthening, proximal hip strenghtening     , PTA 05/15/2022, 6:22 PM     

## 2022-05-17 ENCOUNTER — Ambulatory Visit: Payer: Managed Care, Other (non HMO)

## 2022-05-17 DIAGNOSIS — M6281 Muscle weakness (generalized): Secondary | ICD-10-CM

## 2022-05-17 DIAGNOSIS — M5459 Other low back pain: Secondary | ICD-10-CM | POA: Diagnosis not present

## 2022-05-17 NOTE — Therapy (Signed)
OUTPATIENT PHYSICAL THERAPY TREATMENT NOTE   Patient Name: Rebekah Davis MRN: [id number] DOB:[birth year], 36 y.o., female Today's Date: 05/17/2022  PCP: Jeanie Sewer, NP REFERRING PROVIDER: Dorna Leitz, MD  END OF SESSION:   PT End of Session - 05/17/22 1739     Visit Number 6    Number of Visits 17    Date for PT Re-Evaluation 05/30/22    Authorization Type Med Pay    PT Start Time 5747    PT Stop Time 3403    PT Time Calculation (min) 45 min    Activity Tolerance Patient tolerated treatment well    Behavior During Therapy [facility] for tasks assessed/performed             Past Medical History:  Diagnosis Date   Abdominal pain, suprapubic 06/04/2016   Acute cystitis 03/10/2018   Anxiety    Arthritis    C. difficile diarrhea 06/12/2018   Cataract    Chest pain 03/28/2018   Depression    Depression with suicidal ideation 03/09/2018   Diarrhea 06/13/2018   GERD (gastroesophageal reflux disease)    History of kidney stones    Hypomagnesemia 06/13/2018   Low back pain 07/22/2012   Migraine    Non-suicidal self harm as coping mechanism ([place]) 12/25/2019   Oral thrush 06/12/2018   PCOS (polycystic ovarian syndrome)    PTSD (post-traumatic stress disorder)    Sleep apnea    Substance abuse ([place])    Suicidal ideation 04/12/2018   Past Surgical History:  Procedure Laterality Date   TENOLYSIS Left 10/04/2020   Procedure: LEFT ACHILLES TENDON DEBRIDEMENT AND RECONSTRUCTION, CALCANEAL EXOSTECTOMY, GASTROCNEMIUS RECESSION,  FLEXOR HALLUCIS LONGUS TRANSFER;  Surgeon: Erle Crocker, MD;  Location: [facility];  Service: Orthopedics;  Laterality: Left;  LENGTH OF SURGERY: 1.5 HOURS   Patient Active Problem List   Diagnosis Date Noted   Passive suicidal ideations 11/16/2020   Irregular bleeding 04/12/2020   Achilles tendinitis 03/11/2020   Ovarian cyst 02/19/2020   Deliberate self-cutting 01/23/2020   Transaminitis 06/13/2018   Obesity 03/28/2018   OSA (obstructive sleep apnea)  03/28/2018   GERD (gastroesophageal reflux disease) 03/10/2018   PTSD (post-traumatic stress disorder) 03/09/2018   MDD (major depressive disorder), recurrent severe, without psychosis ([place]) 02/27/2018   Polycystic disease, ovaries 07/05/2016   Spondylisthesis 04/20/2015   Herniated lumbar intervertebral disc 02/28/2013   Sciatica of left side 02/28/2013   Borderline personality disorder ([place]) 12/29/2012   Anxiety 12/24/2012   Essential hypertension 12/24/2012    REFERRING DIAG: M54.50 (ICD-10-CM) - Low back pain  THERAPY DIAG:  Other low back pain  Muscle weakness (generalized)  Rationale for Evaluation and Treatment Rehabilitation  PERTINENT HISTORY:  Anxiety, Depression, PTSD  PRECAUTIONS: None  SUBJECTIVE: Patient reports that she "threw her back" out work yesterday and has been in a lot of pain since then with no relief from over the counter medicine.   PAIN:  Are you having pain?  Yes: NPRS scale: 8/10  Pain location: lower back Pain description: pressure, sharp, numbness Aggravating factors: prolonged sitting, prolonged standing Relieving factors: flexion, walking (short distance), rest   OBJECTIVE: (objective measures completed at initial evaluation unless otherwise dated)  PATIENT SURVEYS:  FOTO: 30% function; 51% predicted   COGNITION:           Overall cognitive status: Within functional limits for tasks assessed  SENSATION: Light touch: decreased in L lateral thigh   POSTURE:            Large body habitus, increased lordosis   PALPATION: TTP to lumbar paraspinals, L gluteal pain   LUMBAR ROM:    Active  A/PROM  eval  Flexion Decreased p!  Extension Increased p!   (Blank rows = not tested)   LOWER EXTREMITY MMT:  Myotomes intact   MMT Right 04/04/2022  Left 04/04/2022   Hip flexion       Hip extension      Hip abduction      Hip adduction      Hip external rotation      Hip internal rotation      Knee extension       Knee flexion      Ankle dorsiflexion       Ankle plantarflexion      Ankle inversion      Ankle eversion      Grossly 4/5 4/5  (Blank rows = not tested)      LUMBAR SPECIAL TESTS:  Straight leg raise test: Positive and Slump test: Positive   FUNCTIONAL TESTS:  30 Second Sit to Stand: 9 reps   GAIT: Distance walked: 70f Assistive device utilized: None Level of assistance: Complete Independence Comments: antalgic gait L   TODAY'S TREATMENT  [facility] Adult PT Treatment:                                                DATE: 05/17/22 Therapeutic Exercise: NuStep lvl 4 LE x 6 min while taking subjective Supine PPT x 10 - 5" hold Modalities: IFC e-stim, output 6-7, lower back, bilateral, pt in sitting with MHP to lower back x15 mins  [facility] Adult PT Treatment:                                                DATE: 05/15/2022 Therapeutic Exercise: NuStep lvl 6 LE x 5 min while taking subjective Palloff press 10# 2x10 BIL Chops with dowel 10# x10 Rt, 7# Lt x10  Supine PPT x 10 - 5" hold LTR x10 BIL Seated sciatic nerve glide x20 Lt Deadlift 5# KB from 14" box (cues for form)  [facility] Adult PT Treatment:                                                DATE: 05/08/2022 Therapeutic Exercise: NuStep lvl 6 UE/LE x 5 min while taking subjective Supine PPT x 10 - 5" hold Supine SLR 2x10 Rt; 1x10, 1x7 on Lt  Modified thomas stretch EOM x1' BIL Supine figure 4 piriformis stretch x30" BIL LTR x10 BIL Open books x10 BIL Seated sciatic nerve glide x20 Lt LAQ 2x10 4# Manual Therapy: Gentle lumbar traction with legs on red Pball 2x1'   PATIENT EDUCATION:  Education details: eval findings, FOTO, HEP, POC Person educated: Patient Education method: Explanation, Demonstration, and Handouts Education comprehension: verbalized understanding and returned demonstration     HOME EXERCISE PROGRAM: Access Code: [email or web address] URL: [email or web address] Date: 04/03/2022 Prepared by: DOctavio Manns  Exercises - Seated Sciatic Tensioner  - 3 x daily - 7 x weekly - 2 sets - 10 reps - Supine Posterior Pelvic Tilt  - 2 x daily - 7 x weekly - 3 sets - 10 reps - 5 sec hold - Active Straight Leg Raise with Quad Set  - 2 x daily - 7 x weekly - 2 sets - 10 reps - Supine Lower Trunk Rotation  - 2 x daily - 7 x weekly - 2 sets - 10 reps - 5 sec hold   ASSESSMENT:   CLINICAL IMPRESSION: Patient presents to PT with high levels of pain today and reports that she "threw her back out" at work yesterday. Regressed exercises this session and utilized modalities to modulate pain as patient was very fearful of exacerbating pain and was very guarded with all movements. E-stim with IFC utilized for pain reduction with therapist adjusting output and monitoring patient throughout. Session today focused on maintaining therapeutic alliance and decreasing pain. Patient continues to benefit from skilled PT services and should be progressed as able to improve functional independence.   OBJECTIVE IMPAIRMENTS Abnormal gait, decreased activity tolerance, decreased endurance, decreased mobility, difficulty walking, decreased ROM, decreased strength, postural dysfunction, and pain.    ACTIVITY LIMITATIONS carrying, lifting, standing, squatting, stairs, transfers, toileting, and dressing   PARTICIPATION LIMITATIONS: cleaning, community activity, and occupation   [place], Time since onset of injury/illness/exacerbation, and 3+ comorbidities: Anxiety, Depression, PTSD  are also affecting patient's functional outcome.     GOALS: Goals reviewed with patient? Yes   SHORT TERM GOALS: Target date: 04/25/2022   Pt will be compliant and knowledgeable with initial HEP for improved comfort and carryover Baseline: initial HEP given  Goal status: MET Pt reports daily adherence 05/01/2022   2.  Pt will self report lower back and left LE pain no greater than 6/10 for improved comfort and functional  ability Baseline: 10/10 at worst Goal status: Ongoing   LONG TERM GOALS: Target date: 05/30/2022   Pt will improve FOTO function score to no less than 51% as proxy for functional improvement Baseline: 30% function Goal status: INITIAL   2.  Pt will increase 30 Second Sit to Stand rep count to no less than 12 reps for improved balance, strength, and functional mobility Baseline: 9 reps  Goal status: INITIAL   3.  Pt will self report lower back and left LE pain no greater than 3/10 for improved comfort and functional ability Baseline: 9/10 at worst Goal status: INITIAL   4.  Pt will be able to lift 25# KB from floor to chest height with no increase in LBP for improved comfort with ADLs and work  Baseline: unable  Goal status: INITIAL   5.  Pt will be show improved comfort with lumbar ROM in order to perform hygiene and toileting care with decrease pain Baseline: unable without pain Goal status: INITIAL     PLAN: PT FREQUENCY: 2x/week   PT DURATION: 8 weeks   PLANNED INTERVENTIONS: Therapeutic exercises, Therapeutic activity, Neuromuscular re-education, Balance training, Gait training, Patient/Family education, Joint mobilization, Aquatic Therapy, Dry Needling, Electrical stimulation, Cryotherapy, Moist heat, Manual therapy, and Re-evaluation.   PLAN FOR NEXT SESSION: assess HEP response, neutral spine core strengthening, proximal hip strengthening, FOTO next session    Margarette Canada, PTA 05/17/22 6:54 PM

## 2022-05-30 ENCOUNTER — Ambulatory Visit: Payer: Managed Care, Other (non HMO)

## 2022-05-30 NOTE — Therapy (Unsigned)
OUTPATIENT PHYSICAL THERAPY TREATMENT NOTE   Patient Name: Rebekah Davis MRN: [id number] DOB:Aug 28, 1986, 36 y.o., female Today's Date: 05/31/2022  PCP: Jeanie Sewer, NP REFERRING PROVIDER: Dorna Leitz, MD  END OF SESSION:   PT End of Session - 05/31/22 1612     Visit Number 7    Number of Visits 17    Date for PT Re-Evaluation 05/30/22    Authorization Type Med Pay    PT Start Time 1615    PT Stop Time 1700    PT Time Calculation (min) 45 min    Activity Tolerance Patient tolerated treatment well    Behavior During Therapy [facility] for tasks assessed/performed              Past Medical History:  Diagnosis Date   Abdominal pain, suprapubic 06/04/2016   Acute cystitis 03/10/2018   Anxiety    Arthritis    C. difficile diarrhea 06/12/2018   Cataract    Chest pain 03/28/2018   Depression    Depression with suicidal ideation 03/09/2018   Diarrhea 06/13/2018   GERD (gastroesophageal reflux disease)    History of kidney stones    Hypomagnesemia 06/13/2018   Low back pain 07/22/2012   Migraine    Non-suicidal self harm as coping mechanism ([place]) 12/25/2019   Oral thrush 06/12/2018   PCOS (polycystic ovarian syndrome)    PTSD (post-traumatic stress disorder)    Sleep apnea    Substance abuse ([place])    Suicidal ideation 04/12/2018   Past Surgical History:  Procedure Laterality Date   TENOLYSIS Left 10/04/2020   Procedure: LEFT ACHILLES TENDON DEBRIDEMENT AND RECONSTRUCTION, CALCANEAL EXOSTECTOMY, GASTROCNEMIUS RECESSION,  FLEXOR HALLUCIS LONGUS TRANSFER;  Surgeon: Erle Crocker, MD;  Location: [place];  Service: Orthopedics;  Laterality: Left;  LENGTH OF SURGERY: 1.5 HOURS   Patient Active Problem List   Diagnosis Date Noted   Passive suicidal ideations 11/16/2020   Irregular bleeding 04/12/2020   Achilles tendinitis 03/11/2020   Ovarian cyst 02/19/2020   Deliberate self-cutting 01/23/2020   Transaminitis 06/13/2018   Obesity 03/28/2018   OSA (obstructive sleep  apnea) 03/28/2018   GERD (gastroesophageal reflux disease) 03/10/2018   PTSD (post-traumatic stress disorder) 03/09/2018   MDD (major depressive disorder), recurrent severe, without psychosis ([place]) 02/27/2018   Polycystic disease, ovaries 07/05/2016   Spondylisthesis 04/20/2015   Herniated lumbar intervertebral disc 02/28/2013   Sciatica of left side 02/28/2013   Borderline personality disorder ([place]) 12/29/2012   Anxiety 12/24/2012   Essential hypertension 12/24/2012    REFERRING DIAG: M54.50 (ICD-10-CM) - Low back pain  THERAPY DIAG:  Other low back pain  Muscle weakness (generalized)  Other abnormalities of gait and mobility  Rationale for Evaluation and Treatment Rehabilitation  PERTINENT HISTORY:  Anxiety, Depression, PTSD  PRECAUTIONS: None  SUBJECTIVE: Reports mainly pressure in low back, denies and radiating symptoms into LE's,  low back symptoms limit prolonged walking, standing and lifting at work.  PAIN:  Are you having pain?  Yes: NPRS scale: 8/10  Pain location: lower back Pain description: pressure, sharp, numbness Aggravating factors: prolonged sitting, prolonged standing Relieving factors: flexion, walking (short distance), rest   OBJECTIVE: (objective measures completed at initial evaluation unless otherwise dated)  PATIENT SURVEYS:  FOTO: 30% function; 51% predicted; 05/31/22 41%   COGNITION:           Overall cognitive status: Within functional limits for tasks assessed  SENSATION: Light touch: decreased in L lateral thigh   POSTURE:            Large body habitus, increased lordosis   PALPATION: TTP to lumbar paraspinals, L gluteal pain   LUMBAR ROM:    Active  A/PROM  eval  Flexion Decreased p!  Extension Increased p!   (Blank rows = not tested)   LOWER EXTREMITY MMT:  Myotomes intact   MMT Right 04/04/2022  Left 04/04/2022   Hip flexion       Hip extension      Hip abduction      Hip adduction      Hip  external rotation      Hip internal rotation      Knee extension      Knee flexion      Ankle dorsiflexion       Ankle plantarflexion      Ankle inversion      Ankle eversion      Grossly 4/5 4/5  (Blank rows = not tested)      LUMBAR SPECIAL TESTS:  Straight leg raise test: Positive and Slump test: Positive; 05/31/22 SLR negative for radicular symptoms, slump test negative B   FUNCTIONAL TESTS:  30 Second Sit to Stand: 9 reps; 05/31/22 11 reps   GAIT: Distance walked: 70f Assistive device utilized: None Level of assistance: Complete Independence Comments: antalgic gait L   TODAY'S TREATMENT  [facility] Adult PT Treatment:                                                DATE: 05/31/22 Therapeutic Exercise: PPT 2s 10x PPT with LE marching 10x Clams B 15x Open book 10/10 Piriformis stretch B 30s x1 Supine OH flexion for thoracic mobility 15x   [facility] Adult PT Treatment:                                                DATE: 05/17/22 Therapeutic Exercise: NuStep lvl 4 LE x 6 min while taking subjective Supine PPT x 10 - 5" hold Modalities: IFC e-stim, output 6-7, lower back, bilateral, pt in sitting with MHP to lower back x15 mins  [facility] Adult PT Treatment:                                                DATE: 05/15/2022 Therapeutic Exercise: NuStep lvl 6 LE x 5 min while taking subjective Palloff press 10# 2x10 BIL Chops with dowel 10# x10 Rt, 7# Lt x10  Supine PPT x 10 - 5" hold LTR x10 BIL Seated sciatic nerve glide x20 Lt Deadlift 5# KB from 14" box (cues for form)  [facility]:                                                DATE: 05/08/2022 Therapeutic Exercise: NuStep lvl 6 UE/LE x 5 min while taking subjective Supine PPT x 10 - 5" hold Supine  SLR 2x10 Rt; 1x10, 1x7 on Lt  Modified thomas stretch EOM x1' BIL Supine figure 4 piriformis stretch x30" BIL LTR x10 BIL Open books x10 BIL Seated sciatic nerve glide x20 Lt LAQ 2x10 4# Manual Therapy: Gentle lumbar  traction with legs on red Pball 2x1'   PATIENT EDUCATION:  Education details: eval findings, FOTO, HEP, POC Person educated: Patient Education method: Explanation, Demonstration, and Handouts Education comprehension: verbalized understanding and returned demonstration     HOME EXERCISE PROGRAM: Access Code: [email or web address] URL: [email or web address] Date: 04/03/2022 Prepared by: Octavio Manns   Exercises - Seated Sciatic Tensioner  - 3 x daily - 7 x weekly - 2 sets - 10 reps - Supine Posterior Pelvic Tilt  - 2 x daily - 7 x weekly - 3 sets - 10 reps - 5 sec hold - Active Straight Leg Raise with Quad Set  - 2 x daily - 7 x weekly - 2 sets - 10 reps - Supine Lower Trunk Rotation  - 2 x daily - 7 x weekly - 2 sets - 10 reps - 5 sec hold   ASSESSMENT:   CLINICAL IMPRESSION: Worst pain now down to 6/10, symptoms described as tightness vs pain, no distinct radicular symptoms noted but some myofascial restrictions noted in L ITB.  FOTO score increased to 41%, 30s STS reps increased to 11.  Encouraged proper breathing technique to facilitate relaxation.  Some L ITB and hip flexor tightness observed.  Symptoms appear muscular and myofascial in nature and would benefit from stretching and core strengthening.   OBJECTIVE IMPAIRMENTS Abnormal gait, decreased activity tolerance, decreased endurance, decreased mobility, difficulty walking, decreased ROM, decreased strength, postural dysfunction, and pain.    ACTIVITY LIMITATIONS carrying, lifting, standing, squatting, stairs, transfers, toileting, and dressing   PARTICIPATION LIMITATIONS: cleaning, community activity, and occupation   Bandana, Time since onset of injury/illness/exacerbation, and 3+ comorbidities: Anxiety, Depression, PTSD  are also affecting patient's functional outcome.     GOALS: Goals reviewed with patient? Yes   SHORT TERM GOALS: Target date: 04/25/2022   Pt will be compliant and knowledgeable  with initial HEP for improved comfort and carryover Baseline: initial HEP given  Goal status: MET Pt reports daily adherence 05/01/2022   2.  Pt will self report lower back and left LE pain no greater than 6/10 for improved comfort and functional ability Baseline: 10/10 at worst; 05/31/22 6/10 worst pain Goal status: Met   LONG TERM GOALS: Target date: 05/30/2022   Pt will improve FOTO function score to no less than 51% as proxy for functional improvement Baseline: 30% function; 05/31/22 41% Goal status: Partially met   2.  Pt will increase 30 Second Sit to Stand rep count to no less than 12 reps for improved balance, strength, and functional mobility Baseline: 9 reps; 05/31/22 11 reps Goal status: Partially   3.  Pt will self report lower back and left LE pain no greater than 3/10 for improved comfort and functional ability Baseline: 9/10 at worst Goal status: INITIAL   4.  Pt will be able to lift 25# KB from floor to chest height with no increase in LBP for improved comfort with ADLs and work  Baseline: unable  Goal status: Ongoing   5.  Pt will be show improved comfort with lumbar ROM in order to perform hygiene and toileting care with decrease pain Baseline: unable without pain Goal status: Ongoing     PLAN: PT FREQUENCY: 2x/week   PT DURATION: 6 weeks  PLANNED INTERVENTIONS: Therapeutic exercises, Therapeutic activity, Neuromuscular re-education, Balance training, Gait training, Patient/Family education, Joint mobilization, Aquatic Therapy, Dry Needling, Electrical stimulation, Cryotherapy, Moist heat, Manual therapy, and Re-evaluation.   PLAN FOR NEXT SESSION: assess HEP response, neutral spine core strengthening, proximal hip strengthening, FOTO next session    Leroy Sea PT  05/31/22 5:16 PM

## 2022-05-31 ENCOUNTER — Ambulatory Visit: Payer: Managed Care, Other (non HMO)

## 2022-05-31 DIAGNOSIS — M6281 Muscle weakness (generalized): Secondary | ICD-10-CM

## 2022-05-31 DIAGNOSIS — R2689 Other abnormalities of gait and mobility: Secondary | ICD-10-CM

## 2022-05-31 DIAGNOSIS — M5459 Other low back pain: Secondary | ICD-10-CM | POA: Diagnosis not present

## 2022-06-12 ENCOUNTER — Ambulatory Visit: Payer: Managed Care, Other (non HMO)

## 2022-06-13 ENCOUNTER — Emergency Department (HOSPITAL_BASED_OUTPATIENT_CLINIC_OR_DEPARTMENT_OTHER)
Admission: EM | Admit: 2022-06-13 | Discharge: 2022-06-13 | Disposition: A | Discharge status: Home / Self Care | Payer: Managed Care, Other (non HMO) | Attending: Emergency Medicine | Admitting: Emergency Medicine

## 2022-06-13 ENCOUNTER — Other Ambulatory Visit: Payer: Self-pay

## 2022-06-13 ENCOUNTER — Ambulatory Visit: Payer: Medicare Other | Admitting: Family

## 2022-06-13 ENCOUNTER — Emergency Department (HOSPITAL_BASED_OUTPATIENT_CLINIC_OR_DEPARTMENT_OTHER): Payer: Managed Care, Other (non HMO)

## 2022-06-13 ENCOUNTER — Ambulatory Visit: Payer: Managed Care, Other (non HMO)

## 2022-06-13 ENCOUNTER — Encounter (HOSPITAL_BASED_OUTPATIENT_CLINIC_OR_DEPARTMENT_OTHER): Payer: Self-pay

## 2022-06-13 DIAGNOSIS — N83202 Unspecified ovarian cyst, left side: Secondary | ICD-10-CM | POA: Diagnosis not present

## 2022-06-13 DIAGNOSIS — N390 Urinary tract infection, site not specified: Secondary | ICD-10-CM | POA: Diagnosis not present

## 2022-06-13 DIAGNOSIS — D72829 Elevated white blood cell count, unspecified: Secondary | ICD-10-CM | POA: Diagnosis not present

## 2022-06-13 DIAGNOSIS — R109 Unspecified abdominal pain: Secondary | ICD-10-CM | POA: Diagnosis present

## 2022-06-13 LAB — URINALYSIS, ROUTINE W REFLEX MICROSCOPIC
Bilirubin Urine: NEGATIVE
Glucose, UA: NEGATIVE mg/dL
Ketones, ur: NEGATIVE mg/dL
Nitrite: NEGATIVE
Protein, ur: NEGATIVE mg/dL
Specific Gravity, Urine: 1.022 (ref 1.005–1.030)
pH: 5.5 (ref 5.0–8.0)

## 2022-06-13 LAB — CBC
HCT: 46 % (ref 36.0–46.0)
Hemoglobin: 15.8 g/dL — ABNORMAL HIGH (ref 12.0–15.0)
MCH: 31.1 pg (ref 26.0–34.0)
MCHC: 34.3 g/dL (ref 30.0–36.0)
MCV: 90.6 fL (ref 80.0–100.0)
Platelets: 312 10*3/uL (ref 150–400)
RBC: 5.08 MIL/uL (ref 3.87–5.11)
RDW: 12.3 % (ref 11.5–15.5)
WBC: 13.5 10*3/uL — ABNORMAL HIGH (ref 4.0–10.5)
nRBC: 0 % (ref 0.0–0.2)

## 2022-06-13 LAB — BASIC METABOLIC PANEL
Anion gap: 13 (ref 5–15)
BUN: 14 mg/dL (ref 6–20)
CO2: 26 mmol/L (ref 22–32)
Calcium: 9.7 mg/dL (ref 8.9–10.3)
Chloride: 102 mmol/L (ref 98–111)
Creatinine, Ser: 1.07 mg/dL — ABNORMAL HIGH (ref 0.44–1.00)
GFR, Estimated: >60 mL/min (ref >60)
Glucose, Bld: 111 mg/dL — ABNORMAL HIGH (ref 70–99)
Potassium: 3.4 mmol/L — ABNORMAL LOW (ref 3.5–5.1)
Sodium: 141 mmol/L (ref 135–145)

## 2022-06-13 LAB — PREGNANCY, URINE: Preg Test, Ur: NEGATIVE

## 2022-06-13 MED ORDER — CEPHALEXIN 500 MG PO CAPS: 500.0000 mg | ORAL_CAPSULE | Freq: Four times a day (QID) | ORAL | 0 refills | Status: AC

## 2022-06-13 NOTE — ED Triage Notes (Signed)
Patient here POV from Home.  Endorses Right Sided Flank Pain that began Yesterday PM and has began to Radiate to her Right Pelvic Area.  Chronic Hematuria. No Known Fevers. Nausea. No Emesis or Diarrhea.   NAD Noted during Triage. A&Ox4. GCS 15. Ambulatory.

## 2022-06-13 NOTE — ED Notes (Signed)
She returns from u/s at this time. In spite of her being tearful and appearing very uncomfortable, she tells me she does not want pain med(s).

## 2022-06-13 NOTE — ED Notes (Signed)
Patient transported to CT 

## 2022-06-13 NOTE — Discharge Instructions (Addendum)
Return if any problems.  See your Physician for recheck in 1 week   °

## 2022-06-13 NOTE — ED Provider Notes (Signed)
[facility] EMERGENCY DEPT Provider Note   CSN: [id number] Arrival date & time: 06/13/22  1111     History  Chief Complaint  Patient presents with   Flank Pain    Rebekah Davis is a 36 y.o. female.  Pt complains of right flank pain.  Pt thinks she may have a kidney stone.  Pt reports she has had a stone in the past and this feels the same.  Patient denies any fever or chills.  She denies any burning with urination.  The history is provided by the patient. No language interpreter was used.  Flank Pain This is a new problem. The current episode started yesterday. The problem occurs constantly. The problem has not changed since onset.Associated symptoms include abdominal pain. Nothing aggravates the symptoms. She has tried nothing for the symptoms. The treatment provided no relief.       Home Medications Prior to Admission medications   Medication Sig Start Date End Date Taking? Authorizing Provider  acetaminophen (TYLENOL) 500 MG tablet Take 1,000 mg by mouth 2 (two) times daily as needed (pain).    [provider]  albuterol (VENTOLIN HFA) 108 (90 Base) MCG/ACT inhaler Inhale 2 puffs into the lungs as needed for wheezing or shortness of breath. 03/06/22   [provider]  ARIPiprazole (ABILIFY) 20 MG tablet Take 1 tablet (20 mg total) by mouth daily. 09/03/21 09/03/22  Barbette Merino, NP  cetirizine (ZYRTEC ALLERGY) 10 MG tablet Take 1 tablet (10 mg total) by mouth at bedtime. Patient not taking: Reported on 03/30/2022 01/29/22 04/29/22  Theadora Rama Scales, PA-C  escitalopram (LEXAPRO) 10 MG tablet Take 1 tablet (10 mg total) by mouth daily. 03/19/22 03/19/23  Dulce Sellar, NP  escitalopram (LEXAPRO) 20 MG tablet Take 20 mg by mouth daily. Patient not taking: Reported on 03/30/2022    [provider]  escitalopram (LEXAPRO) 5 MG tablet Take 1 tablet (5 mg total) by mouth daily. 03/19/22 03/19/23  Dulce Sellar, NP  fluticasone (FLONASE)  50 MCG/ACT nasal spray Place 1 spray into both nostrils daily. Begin by using 2 sprays in each nare daily for 3 to 5 days, then decrease to 1 spray in each nare daily. Patient not taking: Reported on 03/30/2022 01/29/22   Theadora Rama Scales, PA-C  hydrOXYzine (ATARAX) 10 MG tablet Take 1 tablet (10 mg total) by mouth 3 (three) times daily as needed. Patient taking differently: Take 10 mg by mouth daily as needed for anxiety. 02/02/22   Dulce Sellar, NP  ibuprofen (ADVIL) 200 MG tablet Take 400 mg by mouth 2 (two) times daily as needed (pain).    [provider]  methocarbamol (ROBAXIN) 500 MG tablet Take 1 tablet (500 mg total) by mouth 2 (two) times daily. Patient not taking: Reported on 03/30/2022 03/08/22   Prosperi, Christian H, PA-C  olmesartan (BENICAR) 20 MG tablet Take 1 tablet (20 mg total) by mouth daily. Patient not taking: Reported on 03/30/2022 02/18/22   Dulce Sellar, NP  predniSONE (DELTASONE) 20 MG tablet Take 1 tablet (20 mg total) by mouth 2 (two) times daily. 03/30/22   Mancel Bale, MD      Allergies    Adhesive [tape], Cherry, Sulfamethoxazole-trimethoprim, Levofloxacin, Morphine, Gabapentin, Metformin, and Sumatriptan    Review of Systems   Review of Systems  Gastrointestinal:  Positive for abdominal pain.  Genitourinary:  Positive for flank pain.  All other systems reviewed and are negative.   Physical Exam Updated Vital Signs BP (!) 138/95 (BP Location:  Right Wrist)   Pulse (!) 114   Temp 98.1 F (36.7 C)   Resp 18   Ht 5' 5.5" (1.664 m)   Wt (!) 153.3 kg   SpO2 100%   BMI 55.38 kg/m  Physical Exam Vitals and nursing note reviewed.  Constitutional:      Appearance: She is well-developed.  HENT:     Head: Normocephalic.  Cardiovascular:     Rate and Rhythm: Normal rate and regular rhythm.  Pulmonary:     Effort: Pulmonary effort is normal.  Abdominal:     General: Abdomen is flat. There is no distension.  Musculoskeletal:         General: Normal range of motion.     Cervical back: Normal range of motion.  Skin:    General: Skin is warm.  Neurological:     General: No focal deficit present.     Mental Status: She is alert and oriented to person, place, and time.  Psychiatric:        Mood and Affect: Mood normal.     ED Results / Procedures / Treatments   Labs (all labs ordered are listed, but only abnormal results are displayed) Labs Reviewed  URINALYSIS, ROUTINE W REFLEX MICROSCOPIC - Abnormal; Notable for the following components:      Result Value   Hgb urine dipstick MODERATE (*)    Leukocytes,Ua MODERATE (*)    Bacteria, UA FEW (*)    All other components within normal limits  CBC - Abnormal; Notable for the following components:   WBC 13.5 (*)    Hemoglobin 15.8 (*)    All other components within normal limits  BASIC METABOLIC PANEL - Abnormal; Notable for the following components:   Potassium 3.4 (*)    Glucose, Bld 111 (*)    Creatinine, Ser 1.07 (*)    All other components within normal limits  PREGNANCY, URINE    EKG None  Radiology US PELVIC COMPLETE W TRANSVAGINAL AND TORSION R/O  Result Date: 06/13/2022 CLINICAL DATA:  Flank pain EXAM: TRANSABDOMINAL AND TRANSVAGINAL ULTRASOUND OF PELVIS DOPPLER ULTRASOUND OF OVARIES TECHNIQUE: Both transabdominal and transvaginal ultrasound examinations of the pelvis were performed. Transabdominal technique was performed for global imaging of the pelvis including uterus, ovaries, adnexal regions, and pelvic cul-de-sac. It was necessary to proceed with endovaginal exam following the transabdominal exam to visualize the endometrium and ovaries. Color and duplex Doppler ultrasound was utilized to evaluate blood flow to the ovaries. COMPARISON:  CT done earlier today FINDINGS: Uterus Measurements: 6.9 x 3.3 x 4.1 cm = volume: 49.6 mL. No fibroids or other mass visualized. Endometrium Thickness: 5.7 mm.  No focal abnormality visualized. Right ovary  Measurements: 3.1 x 2 x 2.3 cm = volume: 7.7 mL. Normal appearance/no adnexal mass. Left ovary Measurements: 4.2 x 5.6 x 5.6 cm = volume: 68.6 mL. There is 3.9 x 2.7 x 4.3 cm simple appearing cyst in the left adnexa, possibly functional ovarian cyst. Pulsed Doppler evaluation of both ovaries demonstrates normal low-resistance arterial and venous waveforms. Other findings No abnormal free fluid. IMPRESSION: There is 3.9 cm simple appearing cyst in the left adnexa, possibly functional ovarian cyst. No follow-up imaging is recommended. Uterus is unremarkable.  There is no evidence of ovarian torsion. Electronically Signed   By: Ernie Avena M.D.   On: 06/13/2022 14:29   CT Renal Stone Study  Result Date: 06/13/2022 CLINICAL DATA:  2 day history of right flank pain. History of kidney stones. EXAM: CT ABDOMEN  AND PELVIS WITHOUT CONTRAST TECHNIQUE: Multidetector CT imaging of the abdomen and pelvis was performed following the standard protocol without IV contrast. RADIATION DOSE REDUCTION: This exam was performed according to the departmental dose-optimization program which includes automated exposure control, adjustment of the mA and/or kV according to patient size and/or use of iterative reconstruction technique. COMPARISON:  Renal ultrasound 02/10/2020 FINDINGS: Lower chest: Unremarkable. Hepatobiliary: No suspicious focal abnormality in the liver on this study without intravenous contrast. There is no evidence for gallstones, gallbladder wall thickening, or pericholecystic fluid. No intrahepatic or extrahepatic biliary dilation. Pancreas: No focal mass lesion. No dilatation of the main duct. No intraparenchymal cyst. No peripancreatic edema. Spleen: No splenomegaly. No focal mass lesion. Adrenals/Urinary Tract: No adrenal nodule or mass. No stones are seen in either kidney. No secondary changes in either kidney. No evidence for hydroureter. The urinary bladder appears normal for the degree of distention.  Stomach/Bowel: Stomach is unremarkable. No gastric wall thickening. No evidence of outlet obstruction. Duodenum is normally positioned as is the ligament of Treitz. No small bowel wall thickening. No small bowel dilatation. The terminal ileum is normal. The appendix is normal. No gross colonic mass. No colonic wall thickening. Vascular/Lymphatic: No abdominal aortic aneurysm. No abdominal aortic atherosclerotic calcification. There is no gastrohepatic or hepatoduodenal ligament lymphadenopathy. No retroperitoneal or mesenteric lymphadenopathy. No pelvic sidewall lymphadenopathy. Reproductive: The uterus is unremarkable. 6.2 cm cystic lesion left adnexal space with apparent internal septation. Other: No intraperitoneal free fluid. Musculoskeletal: No worrisome lytic or sclerotic osseous abnormality. IMPRESSION: 1. No urinary stone disease. No evidence for secondary changes in either kidney or ureter. 2. 6.2 cm cystic lesion in the left adnexal space with apparent internal septation. Pelvic ultrasound recommended to further evaluate. Electronically Signed   By: Kennith Center M.D.   On: 06/13/2022 12:57    Procedures Procedures    Medications Ordered in ED Medications - No data to display  ED Course/ Medical Decision Making/ A&P                           Medical Decision Making Patient complains of right flank pain and right-sided abdominal pain she is concerned that she could have a kidney stone.  Patient reports she has had some discomfort with urination as well  Amount and/or Complexity of Data Reviewed External Data Reviewed: notes.    Details: Primary care notes reviewed Labs: ordered. Decision-making details documented in ED Course.    Details: Labs are ordered reviewed and interpreted.  Patient has moderate leukocytes few bacteria patient has a slightly elevated white blood cell count at 13.5 Radiology: ordered and independent interpretation performed. Decision-making details documented in ED  Course.    Details: CT renal shows no urinary stone disease.  Shows a 6.2 cm left ovarian cyst which radiologist recommended ultrasound for evaluation.  Sound shows a 4 cm left simple ovarian cyst  Risk Prescription drug management. Risk Details: UA shows possible UTI patient is given a prescription for Keflex I discussed her symptoms with her she is advised to follow-up with her gynecologist for evaluation of ovarian cyst.           Final Clinical Impression(s) / ED Diagnoses Final diagnoses:  Ovarian cyst, left  Urinary tract infection without hematuria, site unspecified    Rx / DC Orders ED Discharge Orders          Ordered    cephALEXin (KEFLEX) 500 MG capsule  4 times daily  06/13/22 1457              Elson Areas, PA-C 06/13/22 2252    Blane Ohara, MD 06/16/22 [phone]

## 2022-06-15 ENCOUNTER — Ambulatory Visit (INDEPENDENT_AMBULATORY_CARE_PROVIDER_SITE_OTHER): Payer: Medicare Other | Admitting: Family

## 2022-06-15 ENCOUNTER — Encounter: Payer: Self-pay | Admitting: Family

## 2022-06-15 VITALS — BP 152/82 | HR 86 | Temp 98.5°F | Ht 65.5 in | Wt 343.2 lb

## 2022-06-15 DIAGNOSIS — N83202 Unspecified ovarian cyst, left side: Secondary | ICD-10-CM | POA: Diagnosis not present

## 2022-06-15 DIAGNOSIS — N3000 Acute cystitis without hematuria: Secondary | ICD-10-CM

## 2022-06-15 DIAGNOSIS — Z124 Encounter for screening for malignant neoplasm of cervix: Secondary | ICD-10-CM

## 2022-06-15 NOTE — Progress Notes (Signed)
Patient ID: Rebekah Davis, female    DOB: 03/15/1986, 36 y.o.   MRN: [id number]  Chief Complaint  Patient presents with   Follow-up    Patient complains of right flank pain and right-sided abdominal pain she is concerned that she could have a kidney stone.  Patient reports she has had some discomfort with urination. Pt now states pain is on both side.     HPI: Right & left flank pain:  seen in ER for pain 2 days ago, renal CT done and no kidney stone found, but she does have a cyst on her left ovary and a mild UTI. Reports pain on both lumbar/flank areas.  UTI:  seen in ER 2 days ago for flank pain, no kidney stones, but told he has a mild UTI, given Keflex tid for 9 days. pt denies any urinary sx, but did have darker than normal urine, better today.  Assessment & Plan:   Problem List Items Addressed This Visit   None Visit Diagnoses     Acute cystitis without hematuria    -  Primary seen in ER 2 days ago and treated. pt taking Keflex as directed. no urinary sx except for bilateral lumbar/flank pain. Advised pt to take abt for 7 days then ok to stop if no symptoms. Advised pt ok to alternate Tylenol 1,000mg  q4h with 600mg  Ibuprofen for pain.    Left ovarian cyst    - found on CT scan in ER on 8/30 - 33mm   Relevant Orders   Ambulatory referral to Gynecology   Encounter for Pap smear of cervix with HPV DNA cotesting       Relevant Orders   Ambulatory referral to Gynecology      Subjective:    Outpatient Medications Prior to Visit  Medication Sig Dispense Refill   acetaminophen (TYLENOL) 500 MG tablet Take 1,000 mg by mouth 2 (two) times daily as needed (pain).     albuterol (VENTOLIN HFA) 108 (90 Base) MCG/ACT inhaler Inhale 2 puffs into the lungs as needed for wheezing or shortness of breath.     ARIPiprazole (ABILIFY) 20 MG tablet Take 1 tablet (20 mg total) by mouth daily. 30 tablet 11   cephALEXin (KEFLEX) 500 MG capsule Take 1 capsule (500 mg total) by mouth 4 (four) times  daily for 10 days. (Patient taking differently: Take 500 mg by mouth 3 (three) times daily.) 28 capsule 0   escitalopram (LEXAPRO) 10 MG tablet Take 1 tablet (10 mg total) by mouth daily. 30 tablet 2   escitalopram (LEXAPRO) 20 MG tablet Take 20 mg by mouth daily.     hydrOXYzine (ATARAX) 10 MG tablet Take 1 tablet (10 mg total) by mouth 3 (three) times daily as needed. (Patient taking differently: Take 10 mg by mouth daily as needed for anxiety.) 30 tablet 0   ibuprofen (ADVIL) 200 MG tablet Take 400 mg by mouth 2 (two) times daily as needed (pain).     cetirizine (ZYRTEC ALLERGY) 10 MG tablet Take 1 tablet (10 mg total) by mouth at bedtime. (Patient not taking: Reported on 03/30/2022) 30 tablet 2   escitalopram (LEXAPRO) 5 MG tablet Take 1 tablet (5 mg total) by mouth daily. 30 tablet 2   fluticasone (FLONASE) 50 MCG/ACT nasal spray Place 1 spray into both nostrils daily. Begin by using 2 sprays in each nare daily for 3 to 5 days, then decrease to 1 spray in each nare daily. (Patient not taking: Reported on 03/30/2022) 32 mL  1   methocarbamol (ROBAXIN) 500 MG tablet Take 1 tablet (500 mg total) by mouth 2 (two) times daily. (Patient not taking: Reported on 03/30/2022) 20 tablet 0   olmesartan (BENICAR) 20 MG tablet Take 1 tablet (20 mg total) by mouth daily. (Patient not taking: Reported on 03/30/2022) 30 tablet 2   predniSONE (DELTASONE) 20 MG tablet Take 1 tablet (20 mg total) by mouth 2 (two) times daily. 10 tablet 0   No facility-administered medications prior to visit.   Past Medical History:  Diagnosis Date   Abdominal pain, suprapubic 06/04/2016   Acute cystitis 03/10/2018   Anxiety    Arthritis    C. difficile diarrhea 06/12/2018   Cataract    Chest pain 03/28/2018   Depression    Depression with suicidal ideation 03/09/2018   Diarrhea 06/13/2018   GERD (gastroesophageal reflux disease)    History of kidney stones    Hypomagnesemia 06/13/2018   Low back pain 07/22/2012   Migraine     Non-suicidal self harm as coping mechanism (HCC) 12/25/2019   Oral thrush 06/12/2018   PCOS (polycystic ovarian syndrome)    PTSD (post-traumatic stress disorder)    Sleep apnea    Substance abuse (HCC)    Suicidal ideation 04/12/2018   Past Surgical History:  Procedure Laterality Date   TENOLYSIS Left 10/04/2020   Procedure: LEFT ACHILLES TENDON DEBRIDEMENT AND RECONSTRUCTION, CALCANEAL EXOSTECTOMY, GASTROCNEMIUS RECESSION,  FLEXOR HALLUCIS LONGUS TRANSFER;  Surgeon: Terance Hart, MD;  Location: [facility] OR;  Service: Orthopedics;  Laterality: Left;  LENGTH OF SURGERY: 1.5 HOURS   Allergies  Allergen Reactions   Adhesive [Tape] Other (See Comments)    blister   Cherry Anaphylaxis and Rash   Sulfamethoxazole-Trimethoprim Hives   Levofloxacin     Other reaction(s): Muscle Pain, Other (See Comments)   Morphine     Other reaction(s): Other (See Comments) Feels like unable to breath or swallow    Gabapentin Rash   Metformin Nausea And Vomiting    Other reaction(s): Other (See Comments) diaphoresis    Sumatriptan Diarrhea and Nausea And Vomiting    Other reaction(s): Other (See Comments) Decreased heart rate and respiratory rate        Objective:    Physical Exam Vitals and nursing note reviewed.  Constitutional:      Appearance: Normal appearance. She is obese.  Cardiovascular:     Rate and Rhythm: Normal rate and regular rhythm.  Pulmonary:     Effort: Pulmonary effort is normal.     Breath sounds: Normal breath sounds.  Musculoskeletal:        General: Normal range of motion.     Lumbar back: Tenderness present.  Skin:    General: Skin is warm and dry.  Neurological:     Mental Status: She is alert.  Psychiatric:        Mood and Affect: Mood normal.        Behavior: Behavior normal.    BP (!) 152/82 (BP Location: Left Arm, Patient Position: Sitting, Cuff Size: Large)   Pulse 86   Temp 98.5 F (36.9 C) (Temporal)   Ht 5' 5.5" (1.664 m)   Wt (!) 343 lb 3.2  oz (155.7 kg)   LMP 05/31/2022 (Approximate)   SpO2 99%   BMI 56.24 kg/m  Wt Readings from Last 3 Encounters:  06/15/22 (!) 343 lb 3.2 oz (155.7 kg)  06/13/22 (!) 337 lb 15.4 oz (153.3 kg)  03/08/22 (!) 338 lb (153.3 kg)  Jeanie Sewer, NP

## 2022-06-15 NOTE — Patient Instructions (Addendum)
It was very nice to see you today!   Continue to take the antibiotic for your bladder infection. OK to stop after 7 days if you are not having any symptoms. Continue to take up to 600mg  of Ibuprofen, can alternate with 2 extra strength Tylenol every 4 hours. I have sent a referral to our gynecology office for follow up and PAP smear.  Have a great weekend!     PLEASE NOTE:  If you had any lab tests please let know if you have not heard back within a few days. You may see your results on MyChart before we have a chance to review them but we will give you a call once they are reviewed by [place]. If we ordered any referrals today, please let us know if you have not heard from their office within the next week.

## 2022-06-19 ENCOUNTER — Ambulatory Visit: Payer: Managed Care, Other (non HMO)

## 2022-06-20 ENCOUNTER — Ambulatory Visit (INDEPENDENT_AMBULATORY_CARE_PROVIDER_SITE_OTHER): Payer: Medicare Other | Admitting: Family

## 2022-06-20 ENCOUNTER — Encounter: Payer: Self-pay | Admitting: Family

## 2022-06-20 ENCOUNTER — Ambulatory Visit: Payer: Managed Care, Other (non HMO)

## 2022-06-20 VITALS — BP 134/80 | HR 108 | Temp 98.7°F | Ht 65.5 in | Wt 342.2 lb

## 2022-06-20 DIAGNOSIS — R053 Chronic cough: Secondary | ICD-10-CM

## 2022-06-20 DIAGNOSIS — N898 Other specified noninflammatory disorders of vagina: Secondary | ICD-10-CM

## 2022-06-20 LAB — POC COVID19 BINAXNOW: SARS Coronavirus 2 Ag: NEGATIVE

## 2022-06-20 MED ORDER — FLUCONAZOLE 150 MG PO TABS: 150.0000 mg | ORAL_TABLET | ORAL | 0 refills | Status: DC | PRN

## 2022-06-20 MED ORDER — BENZONATATE 200 MG PO CAPS: 200.0000 mg | ORAL_CAPSULE | Freq: Three times a day (TID) | ORAL | 0 refills | Status: DC | PRN

## 2022-06-20 NOTE — Progress Notes (Signed)
Patient ID: Rebekah Davis, female    DOB: 01-Mar-1986, 36 y.o.   MRN: [id number]  Chief Complaint  Patient presents with   Cough   Vaginal Itching    HPI:   Vaginal itching:  having itching and burning in vaginal area, denies any discharge, pt is on day 4 of Keflex.  Persistent cough:  non-productive, started 4 days ago, denies increased SOB, does wake her up occasionally, but coughs during day also, no sinus sx, mild fever, no sore throat.      Assessment & Plan:  1. Persistent cough Covid neg. lungs clear, no wheezing or congestion noted. Sending Tesaalon pearles, advised on use & SE, use rescue inhaler in am and qhs, continue to drink at least 2L of water daily.  - POC COVID-19 - benzonatate (TESSALON) 200 MG capsule; Take 1 capsule (200 mg total) by mouth 3 (three) times daily as needed for up to 10 days for cough.  Dispense: 30 capsule; Refill: 0  2. Vaginal itching antibiotic induced vaginitis. sending Diflucan, advised on use & SE.  - fluconazole (DIFLUCAN) 150 MG tablet; Take 1 tablet (150 mg total) by mouth every three (3) days as needed.  Dispense: 2 tablet; Refill: 0   Subjective:    Outpatient Medications Prior to Visit  Medication Sig Dispense Refill   acetaminophen (TYLENOL) 500 MG tablet Take 1,000 mg by mouth 2 (two) times daily as needed (pain).     albuterol (VENTOLIN HFA) 108 (90 Base) MCG/ACT inhaler Inhale 2 puffs into the lungs as needed for wheezing or shortness of breath.     ARIPiprazole (ABILIFY) 20 MG tablet Take 1 tablet (20 mg total) by mouth daily. 30 tablet 11   cephALEXin (KEFLEX) 500 MG capsule Take 1 capsule (500 mg total) by mouth 4 (four) times daily for 10 days. (Patient taking differently: Take 500 mg by mouth 3 (three) times daily.) 28 capsule 0   escitalopram (LEXAPRO) 10 MG tablet Take 1 tablet (10 mg total) by mouth daily. 30 tablet 2   escitalopram (LEXAPRO) 20 MG tablet Take 20 mg by mouth daily.     hydrOXYzine (ATARAX) 10 MG tablet  Take 1 tablet (10 mg total) by mouth 3 (three) times daily as needed. (Patient taking differently: Take 10 mg by mouth daily as needed for anxiety.) 30 tablet 0   ibuprofen (ADVIL) 200 MG tablet Take 400 mg by mouth 2 (two) times daily as needed (pain).     No facility-administered medications prior to visit.   Past Medical History:  Diagnosis Date   Abdominal pain, suprapubic 06/04/2016   Acute cystitis 03/10/2018   Anxiety    Arthritis    C. difficile diarrhea 06/12/2018   Cataract    Chest pain 03/28/2018   Depression    Depression with suicidal ideation 03/09/2018   Diarrhea 06/13/2018   GERD (gastroesophageal reflux disease)    History of kidney stones    Hypomagnesemia 06/13/2018   Low back pain 07/22/2012   Migraine    Non-suicidal self harm as coping mechanism (HCC) 12/25/2019   Oral thrush 06/12/2018   PCOS (polycystic ovarian syndrome)    PTSD (post-traumatic stress disorder)    Sleep apnea    Substance abuse (HCC)    Suicidal ideation 04/12/2018   Past Surgical History:  Procedure Laterality Date   TENOLYSIS Left 10/04/2020   Procedure: LEFT ACHILLES TENDON DEBRIDEMENT AND RECONSTRUCTION, CALCANEAL EXOSTECTOMY, GASTROCNEMIUS RECESSION,  FLEXOR HALLUCIS LONGUS TRANSFER;  Surgeon: Terance Hart, MD;  Location:  [facility] OR;  Service: Orthopedics;  Laterality: Left;  LENGTH OF SURGERY: 1.5 HOURS   Allergies  Allergen Reactions   Adhesive [Tape] Other (See Comments)    blister   Cherry Anaphylaxis and Rash   Sulfamethoxazole-Trimethoprim Hives   Levofloxacin     Other reaction(s): Muscle Pain, Other (See Comments)   Morphine     Other reaction(s): Other (See Comments) Feels like unable to breath or swallow    Gabapentin Rash   Metformin Nausea And Vomiting    Other reaction(s): Other (See Comments) diaphoresis    Sumatriptan Diarrhea and Nausea And Vomiting    Other reaction(s): Other (See Comments) Decreased heart rate and respiratory rate        Objective:     Physical Exam Vitals and nursing note reviewed.  Constitutional:      Appearance: Normal appearance. She is obese.  HENT:     Right Ear: Tympanic membrane and ear canal normal.     Left Ear: Tympanic membrane and ear canal normal.     Mouth/Throat:     Mouth: Mucous membranes are moist.  Cardiovascular:     Rate and Rhythm: Normal rate and regular rhythm.  Pulmonary:     Effort: Pulmonary effort is normal.     Breath sounds: Normal breath sounds.  Musculoskeletal:        General: Normal range of motion.  Skin:    General: Skin is warm and dry.  Neurological:     Mental Status: She is alert.  Psychiatric:        Mood and Affect: Mood normal.        Behavior: Behavior normal.    BP 134/80   Pulse (!) 108   Temp 98.7 F (37.1 C)   Ht 5' 5.5" (1.664 m)   Wt (!) 342 lb 3.2 oz (155.2 kg)   LMP 05/31/2022 (Approximate)   SpO2 99%   BMI 56.08 kg/m  Wt Readings from Last 3 Encounters:  06/20/22 (!) 342 lb 3.2 oz (155.2 kg)  06/15/22 (!) 343 lb 3.2 oz (155.7 kg)  06/13/22 (!) 337 lb 15.4 oz (153.3 kg)       Dulce Sellar, NP

## 2022-06-26 ENCOUNTER — Ambulatory Visit: Payer: Managed Care, Other (non HMO) | Attending: Orthopedic Surgery

## 2022-06-26 DIAGNOSIS — M5459 Other low back pain: Secondary | ICD-10-CM | POA: Insufficient documentation

## 2022-06-26 DIAGNOSIS — M6281 Muscle weakness (generalized): Secondary | ICD-10-CM | POA: Insufficient documentation

## 2022-06-26 NOTE — Therapy (Addendum)
OUTPATIENT PHYSICAL THERAPY TREATMENT NOTE/DISCHARGE  PHYSICAL THERAPY DISCHARGE SUMMARY  Visits from Start of Care: 8  Current functional level related to goals / functional outcomes: Unable to assess   Remaining deficits: Unable to assess   Education / Equipment: HEP   Patient agrees to discharge. Patient goals were  unable to assess . Patient is being discharged due to not returning since the last visit.   Patient Name: Rebekah Davis MRN: [id number] DOB:11-07-1985, 36 y.o., female Today's Date: 06/26/2022  PCP: Jeanie Sewer, NP REFERRING PROVIDER: Dorna Leitz, MD  END OF SESSION:   PT End of Session - 06/26/22 1741     Visit Number 8    Number of Visits 17    Date for PT Re-Evaluation 05/30/22    Authorization Type Med Pay    PT Start Time 2876    PT Stop Time 8115    PT Time Calculation (min) 40 min    Activity Tolerance Patient tolerated treatment well    Behavior During Therapy [facility] for tasks assessed/performed               Past Medical History:  Diagnosis Date   Abdominal pain, suprapubic 06/04/2016   Acute cystitis 03/10/2018   Anxiety    Arthritis    C. difficile diarrhea 06/12/2018   Cataract    Chest pain 03/28/2018   Depression    Depression with suicidal ideation 03/09/2018   Diarrhea 06/13/2018   GERD (gastroesophageal reflux disease)    History of kidney stones    Hypomagnesemia 06/13/2018   Low back pain 07/22/2012   Migraine    Non-suicidal self harm as coping mechanism ([place]) 12/25/2019   Oral thrush 06/12/2018   PCOS (polycystic ovarian syndrome)    PTSD (post-traumatic stress disorder)    Sleep apnea    Substance abuse ([place])    Suicidal ideation 04/12/2018   Past Surgical History:  Procedure Laterality Date   TENOLYSIS Left 10/04/2020   Procedure: LEFT ACHILLES TENDON DEBRIDEMENT AND RECONSTRUCTION, CALCANEAL EXOSTECTOMY, GASTROCNEMIUS RECESSION,  FLEXOR HALLUCIS LONGUS TRANSFER;  Surgeon: Erle Crocker, MD;  Location: [place] [facility];  Service: Orthopedics;  Laterality: Left;  LENGTH OF SURGERY: 1.5 HOURS   Patient Active Problem List   Diagnosis Date Noted   Passive suicidal ideations 11/16/2020   Irregular bleeding 04/12/2020   Achilles tendinitis 03/11/2020   Ovarian cyst 02/19/2020   Deliberate self-cutting 01/23/2020   Transaminitis 06/13/2018   Obesity 03/28/2018   GERD (gastroesophageal reflux disease) 03/10/2018   PTSD (post-traumatic stress disorder) 03/09/2018   MDD (major depressive disorder), recurrent severe, without psychosis ([place]) 02/27/2018   Polycystic disease, ovaries 07/05/2016   Spondylisthesis 04/20/2015   Herniated lumbar intervertebral disc 02/28/2013   Sciatica of left side 02/28/2013   Borderline personality disorder ([place]) 12/29/2012   Anxiety 12/24/2012   Essential hypertension 12/24/2012    REFERRING DIAG: M54.50 (ICD-10-CM) - Low back pain  THERAPY DIAG:  Other low back pain  Muscle weakness (generalized)  Rationale for Evaluation and Treatment Rehabilitation  PERTINENT HISTORY:  Anxiety, Depression, PTSD  PRECAUTIONS: None  SUBJECTIVE: Patient reports that she has been working more the past couple weeks, working her second job after her first. She also reports that her MD recently found an ovarian cyst and she will find out on Monday if they will remove it.   PAIN:  Are you having pain?  Yes: NPRS scale: 2/10  Pain location: lower back Pain description: pressure, sharp, numbness Aggravating factors: prolonged sitting, prolonged standing Relieving  factors: flexion, walking (short distance), rest   OBJECTIVE: (objective measures completed at initial evaluation unless otherwise dated)  PATIENT SURVEYS:  FOTO: 30% function; 51% predicted; 05/31/22 41%   COGNITION:           Overall cognitive status: Within functional limits for tasks assessed                          SENSATION: Light touch: decreased in L lateral thigh   POSTURE:            Large body habitus,  increased lordosis   PALPATION: TTP to lumbar paraspinals, L gluteal pain   LUMBAR ROM:    Active  A/PROM  eval  Flexion Decreased p!  Extension Increased p!   (Blank rows = not tested)   LOWER EXTREMITY MMT:  Myotomes intact   MMT Right 04/04/2022  Left 04/04/2022   Hip flexion       Hip extension      Hip abduction      Hip adduction      Hip external rotation      Hip internal rotation      Knee extension      Knee flexion      Ankle dorsiflexion       Ankle plantarflexion      Ankle inversion      Ankle eversion      Grossly 4/5 4/5  (Blank rows = not tested)      LUMBAR SPECIAL TESTS:  Straight leg raise test: Positive and Slump test: Positive; 05/31/22 SLR negative for radicular symptoms, slump test negative B   FUNCTIONAL TESTS:  30 Second Sit to Stand: 9 reps; 05/31/22 11 reps   GAIT: Distance walked: 20f Assistive device utilized: None Level of assistance: Complete Independence Comments: antalgic gait L   TODAY'S TREATMENT  [facility] Adult PT Treatment:                                                DATE: 06/26/2022 Therapeutic Exercise: NuStep lvl 4 LE x 6 min while taking subjective Palloff press 10# 2x10 BIL PPT 2s 2x10 PPT with LE marching 2x10 BIL Clams B 15x Open book 10/10 Piriformis stretch B 30s x2 Supine OH flexion for thoracic mobility 15x   [facility] Adult PT Treatment:                                                DATE: 05/31/22 Therapeutic Exercise: PPT 2s 10x PPT with LE marching 10x Clams B 15x Open book 10/10 Piriformis stretch B 30s x1 Supine OH flexion for thoracic mobility 15x   [facility] Adult PT Treatment:                                                DATE: 05/17/22 Therapeutic Exercise: NuStep lvl 4 LE x 6 min while taking subjective Supine PPT x 10 - 5" hold Modalities: IFC e-stim, output 6-7, lower back, bilateral, pt in sitting with MHP to lower back x15 mins   PATIENT EDUCATION:  Education details: eval findings, FOTO, HEP,  POC Person educated: Patient Education method: Explanation, Demonstration, and Handouts Education comprehension: verbalized understanding and returned demonstration     HOME EXERCISE PROGRAM: Access Code: [email or web address] URL: [email or web address] Date: 04/03/2022 Prepared by: Octavio Manns   Exercises - Seated Sciatic Tensioner  - 3 x daily - 7 x weekly - 2 sets - 10 reps - Supine Posterior Pelvic Tilt  - 2 x daily - 7 x weekly - 3 sets - 10 reps - 5 sec hold - Active Straight Leg Raise with Quad Set  - 2 x daily - 7 x weekly - 2 sets - 10 reps - Supine Lower Trunk Rotation  - 2 x daily - 7 x weekly - 2 sets - 10 reps - 5 sec hold   ASSESSMENT:   CLINICAL IMPRESSION:  Patient presents to PT with decreased back pain and reports that she recently found out she has an ovarian cyst that might need to be removed. She also recently had a kidney infection, thrush, and has been working overtime at her two jobs so she has not been diligently working on her HEP. Session today continued to focus on thoracic mobility and proximal hip and core strengthening. Patient continues to benefit from skilled PT services and should be progressed as able to improve functional independence.  OBJECTIVE IMPAIRMENTS Abnormal gait, decreased activity tolerance, decreased endurance, decreased mobility, difficulty walking, decreased ROM, decreased strength, postural dysfunction, and pain.    ACTIVITY LIMITATIONS carrying, lifting, standing, squatting, stairs, transfers, toileting, and dressing   PARTICIPATION LIMITATIONS: cleaning, community activity, and occupation   [place], Time since onset of injury/illness/exacerbation, and 3+ comorbidities: Anxiety, Depression, PTSD  are also affecting patient's functional outcome.     GOALS: Goals reviewed with patient? Yes   SHORT TERM GOALS: Target date: 04/25/2022   Pt will be compliant and knowledgeable with initial HEP for improved comfort  and carryover Baseline: initial HEP given  Goal status: MET Pt reports daily adherence 05/01/2022   2.  Pt will self report lower back and left LE pain no greater than 6/10 for improved comfort and functional ability Baseline: 10/10 at worst; 05/31/22 6/10 worst pain Goal status: Met   LONG TERM GOALS: Target date: 05/30/2022   Pt will improve FOTO function score to no less than 51% as proxy for functional improvement Baseline: 30% function; 05/31/22 41% Goal status: Partially met   2.  Pt will increase 30 Second Sit to Stand rep count to no less than 12 reps for improved balance, strength, and functional mobility Baseline: 9 reps; 05/31/22 11 reps Goal status: Partially   3.  Pt will self report lower back and left LE pain no greater than 3/10 for improved comfort and functional ability Baseline: 9/10 at worst Goal status: INITIAL   4.  Pt will be able to lift 25# KB from floor to chest height with no increase in LBP for improved comfort with ADLs and work  Baseline: unable  Goal status: Ongoing   5.  Pt will be show improved comfort with lumbar ROM in order to perform hygiene and toileting care with decrease pain Baseline: unable without pain Goal status: Ongoing     PLAN: PT FREQUENCY: 2x/week   PT DURATION: 6 weeks   PLANNED INTERVENTIONS: Therapeutic exercises, Therapeutic activity, Neuromuscular re-education, Balance training, Gait training, Patient/Family education, Joint mobilization, Aquatic Therapy, Dry Needling, Electrical stimulation, Cryotherapy, Moist heat, Manual therapy, and Re-evaluation.   PLAN FOR NEXT SESSION: assess  HEP response, neutral spine core strengthening, proximal hip strengthening   Margarette Canada, PTA 06/26/22 5:42 PM

## 2022-06-27 ENCOUNTER — Ambulatory Visit: Payer: Managed Care, Other (non HMO)

## 2022-06-28 ENCOUNTER — Encounter: Payer: Self-pay | Admitting: Obstetrics & Gynecology

## 2022-06-28 ENCOUNTER — Ambulatory Visit: Payer: Managed Care, Other (non HMO) | Admitting: Obstetrics & Gynecology

## 2022-06-28 ENCOUNTER — Other Ambulatory Visit (HOSPITAL_COMMUNITY)
Admission: RE | Admit: 2022-06-28 | Discharge: 2022-06-28 | Disposition: A | Discharge status: Home / Self Care | Payer: Medicare Other | Source: Ambulatory Visit | Attending: Obstetrics and Gynecology | Admitting: Obstetrics and Gynecology

## 2022-06-28 ENCOUNTER — Telehealth: Payer: Self-pay | Admitting: *Deleted

## 2022-06-28 ENCOUNTER — Telehealth: Payer: Medicare Other | Admitting: Family

## 2022-06-28 VITALS — BP 124/78 | HR 109 | Temp 98.7°F | Ht 65.0 in | Wt 340.0 lb

## 2022-06-28 DIAGNOSIS — Z3009 Encounter for other general counseling and advice on contraception: Secondary | ICD-10-CM | POA: Diagnosis not present

## 2022-06-28 DIAGNOSIS — Z6841 Body Mass Index (BMI) 40.0 and over, adult: Secondary | ICD-10-CM

## 2022-06-28 DIAGNOSIS — R102 Pelvic and perineal pain: Secondary | ICD-10-CM

## 2022-06-28 DIAGNOSIS — Z01419 Encounter for gynecological examination (general) (routine) without abnormal findings: Secondary | ICD-10-CM | POA: Insufficient documentation

## 2022-06-28 DIAGNOSIS — N83202 Unspecified ovarian cyst, left side: Secondary | ICD-10-CM

## 2022-06-28 NOTE — Telephone Encounter (Signed)
Referral paced at [facility] they will call to schedule.

## 2022-06-28 NOTE — Telephone Encounter (Signed)
-----   Message from Genia Del, MD sent at 06/28/2022  2:46 PM EDT ----- Regarding: Refer to [facility] Obesity BMI 56 for weight loss management
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
## 2022-06-29 ENCOUNTER — Other Ambulatory Visit: Payer: Self-pay | Admitting: Family

## 2022-06-30 LAB — URINALYSIS, COMPLETE W/RFL CULTURE
Bilirubin Urine: NEGATIVE
Glucose, UA: NEGATIVE
Hyaline Cast: NONE SEEN /LPF
Ketones, ur: NEGATIVE
Leukocyte Esterase: NEGATIVE
Nitrites, Initial: NEGATIVE
Protein, ur: NEGATIVE
Specific Gravity, Urine: 1.02 (ref 1.001–1.035)
WBC, UA: NONE SEEN /HPF (ref 0–5)
pH: 7 (ref 5.0–8.0)

## 2022-06-30 LAB — CULTURE INDICATED

## 2022-06-30 LAB — URINE CULTURE
MICRO NUMBER:: 13917673
SPECIMEN QUALITY:: ADEQUATE

## 2022-07-02 ENCOUNTER — Encounter: Payer: Self-pay | Admitting: Obstetrics and Gynecology

## 2022-07-02 ENCOUNTER — Encounter: Payer: Self-pay | Admitting: Family

## 2022-07-02 DIAGNOSIS — J209 Acute bronchitis, unspecified: Secondary | ICD-10-CM

## 2022-07-03 ENCOUNTER — Encounter: Payer: Self-pay | Admitting: Obstetrics & Gynecology

## 2022-07-03 ENCOUNTER — Ambulatory Visit: Payer: Managed Care, Other (non HMO)

## 2022-07-03 MED ORDER — AZITHROMYCIN 250 MG PO TABS: ORAL_TABLET | ORAL | 0 refills | Status: AC

## 2022-07-03 MED ORDER — PREDNISONE 20 MG PO TABS: ORAL_TABLET | ORAL | 0 refills | Status: DC

## 2022-07-03 NOTE — Telephone Encounter (Signed)
Patient scheduled on 07/30/22

## 2022-07-04 ENCOUNTER — Ambulatory Visit: Payer: Managed Care, Other (non HMO)

## 2022-07-05 LAB — CYTOLOGY - PAP
Diagnosis: NEGATIVE
Diagnosis: REACTIVE

## 2022-07-10 ENCOUNTER — Ambulatory Visit (INDEPENDENT_AMBULATORY_CARE_PROVIDER_SITE_OTHER): Payer: Medicare Other

## 2022-07-10 ENCOUNTER — Ambulatory Visit: Payer: Managed Care, Other (non HMO)

## 2022-07-10 DIAGNOSIS — Z0000 Encounter for general adult medical examination without abnormal findings: Secondary | ICD-10-CM | POA: Diagnosis not present

## 2022-07-10 NOTE — Patient Instructions (Signed)
Rebekah Davis , Thank you for taking time to come for your Medicare Wellness Visit. I appreciate your ongoing commitment to your health goals. Please review the following plan we discussed and let me know if I can assist you in the future.   These are the goals we discussed:  Goals      Patient Stated     Losing weight      Weight (lb) < 200 lb (90.7 kg)        This is a list of the screening recommended for you and due dates:  Health Maintenance  Topic Date Due   Hepatitis C Screening: USPSTF Recommendation to screen - Ages 18-79 yo.  09/22/2022*   Pap Smear  06/28/2025   Tetanus Vaccine  01/20/2030   HIV Screening  Completed   HPV Vaccine  Aged Out   Flu Shot  Discontinued   COVID-19 Vaccine  Discontinued  *Topic was postponed. The date shown is not the original due date.    Advanced directives: Advance directive discussed with you today. I have provided a copy for you to complete at home and have notarized. Once this is complete please bring a copy in to our office so we can scan it into your chart.  Conditions/risks identified: weight loss  Next appointment: Follow up in one year for your annual wellness visit.   Preventive Care 36-3 Years Old, Female Preventive care refers to lifestyle choices and visits with your health care provider that can promote health and wellness. Preventive care visits are also called wellness exams. What can I expect for my preventive care visit? Counseling During your preventive care visit, your health care provider may ask about your: Medical history, including: Past medical problems. Family medical history. Pregnancy history. Current health, including: Menstrual cycle. Method of birth control. Emotional well-being. Home life and relationship well-being. Sexual activity and sexual health. Lifestyle, including: Alcohol, nicotine or tobacco, and drug use. Access to firearms. Diet, exercise, and sleep habits. Work and work  Statistician. Sunscreen use. Safety issues such as seatbelt and bike helmet use. Physical exam Your health care provider may check your: Height and weight. These may be used to calculate your BMI (body mass index). BMI is a measurement that tells if you are at a healthy weight. Waist circumference. This measures the distance around your waistline. This measurement also tells if you are at a healthy weight and may help predict your risk of certain diseases, such as type 2 diabetes and high blood pressure. Heart rate and blood pressure. Body temperature. Skin for abnormal spots. What immunizations do I need? Vaccines are usually given at various ages, according to a schedule. Your health care provider will recommend vaccines for you based on your age, medical history, and lifestyle or other factors, such as travel or where you work. What tests do I need? Screening Your health care provider may recommend screening tests for certain conditions. This may include: Pelvic exam and Pap test. Lipid and cholesterol levels. Diabetes screening. This is done by checking your blood sugar (glucose) after you have not eaten for a while (fasting). Hepatitis B test. Hepatitis C test. HIV (human immunodeficiency virus) test. STI (sexually transmitted infection) testing, if you are at risk. BRCA-related cancer screening. This may be done if you have a family history of breast, ovarian, tubal, or peritoneal cancers. Talk with your health care provider about your test results, treatment options, and if necessary, the need for more tests. Follow these instructions at home: Eating  and drinking  Eat a healthy diet that includes fresh fruits and vegetables, whole grains, lean protein, and low-fat dairy products. Take vitamin and mineral supplements as recommended by your health care provider. Do not drink alcohol if: Your health care provider tells you not to drink. You are pregnant, may be pregnant, or are  planning to become pregnant. If you drink alcohol: Limit how much you have to 0-1 drink a day. Know how much alcohol is in your drink. In the [place] one drink equals one 12 oz bottle of beer (355 mL), one 5 oz glass of wine (148 mL), or one 1 oz glass of hard liquor (44 mL). Lifestyle Brush your teeth every morning and night with fluoride toothpaste. Floss one time each day. Exercise for at least 30 minutes 5 or more days each week. Do not use any products that contain nicotine or tobacco. These products include cigarettes, chewing tobacco, and vaping devices, such as e-cigarettes. If you need help quitting, ask your health care provider. Do not use drugs. If you are sexually active, practice safe sex. Use a condom or other form of protection to prevent STIs. If you do not wish to become pregnant, use a form of birth control. If you plan to become pregnant, see your health care provider for a prepregnancy visit. Find healthy ways to manage stress, such as: Meditation, yoga, or listening to music. Journaling. Talking to a trusted person. Spending time with friends and family. Minimize exposure to UV radiation to reduce your risk of skin cancer. Safety Always wear your seat belt while driving or riding in a vehicle. Do not drive: If you have been drinking alcohol. Do not ride with someone who has been drinking. If you have been using any mind-altering substances or drugs. While texting. When you are tired or distracted. Wear a helmet and other protective equipment during sports activities. If you have firearms in your house, make sure you follow all gun safety procedures. Seek help if you have been physically or sexually abused. What's next? Go to your health care provider once a year for an annual wellness visit. Ask your health care provider how often you should have your eyes and teeth checked. Stay up to date on all vaccines. This information is not intended to replace advice given  to you by your health care provider. Make sure you discuss any questions you have with your health care provider. Document Revised: 03/29/2021 Document Reviewed: 03/29/2021 Elsevier Patient Education  [place].

## 2022-07-10 NOTE — Progress Notes (Signed)
Virtual Visit via Telephone Note  I connected with  Rebekah Davis on 07/10/22 at 11:45 AM EDT by telephone and verified that I am speaking with the correct person using two identifiers.  Medicare Annual Wellness visit completed telephonically due to Covid-19 pandemic.   Persons participating in this call: This Health Coach and this patient.   Location: Patient: home Provider: office   I discussed the limitations, risks, security and privacy concerns of performing an evaluation and management service by telephone and the availability of in person appointments. The patient expressed understanding and agreed to proceed.  Unable to perform video visit due to video visit attempted and failed and/or patient does not have video capability.   Some vital signs may be absent or patient reported.   Rebekah Brace, LPN   Subjective:   Rebekah Davis is a 36 y.o. female who presents for Medicare Annual (Subsequent) preventive examination.  Review of Systems     Cardiac Risk Factors include: obesity (BMI >30kg/m2);hypertension     Objective:    There were no vitals filed for this visit. There is no height or weight on file to calculate BMI.     07/10/2022   11:45 AM 06/13/2022   11:18 AM 03/30/2022    7:01 AM 03/08/2022    2:32 PM 07/06/2021    4:27 PM 06/30/2021    1:17 PM 09/14/2020    2:41 PM  Advanced Directives  Does Patient Have a Medical Advance Directive? No No No No No No No  Would patient like information on creating a medical advance directive? Yes (MAU/Ambulatory/Procedural Areas - Information given) No - Patient declined No - Patient declined  No - Patient declined No - Patient declined     Current Medications (verified) Outpatient Encounter Medications as of 07/10/2022  Medication Sig   acetaminophen (TYLENOL) 500 MG tablet Take 1,000 mg by mouth 2 (two) times daily as needed (pain).   albuterol (VENTOLIN HFA) 108 (90 Base) MCG/ACT inhaler Inhale 2 puffs into the lungs as  needed for wheezing or shortness of breath.   ARIPiprazole (ABILIFY) 20 MG tablet Take 1 tablet (20 mg total) by mouth daily.   escitalopram (LEXAPRO) 10 MG tablet TAKE 1 TABLET(10 MG) BY MOUTH DAILY   hydrOXYzine (ATARAX) 10 MG tablet Take 1 tablet (10 mg total) by mouth 3 (three) times daily as needed.   ibuprofen (ADVIL) 200 MG tablet Take 400 mg by mouth 2 (two) times daily as needed (pain).   [DISCONTINUED] fluconazole (DIFLUCAN) 150 MG tablet Take 1 tablet (150 mg total) by mouth every three (3) days as needed. (Patient not taking: Reported on 06/28/2022)   [DISCONTINUED] predniSONE (DELTASONE) 20 MG tablet Take 2 pills in the morning with breakfast for 3 days, then 1 pill for 2 days   No facility-administered encounter medications on file as of 07/10/2022.    Allergies (verified) Adhesive [tape], Cherry, Sulfamethoxazole-trimethoprim, Levofloxacin, Morphine, Gabapentin, Metformin, and Sumatriptan   History: Past Medical History:  Diagnosis Date   Abdominal pain, suprapubic 06/04/2016   Abnormal Pap smear of cervix    Acute cystitis 03/10/2018   Anxiety    Arthritis    C. difficile diarrhea 06/12/2018   Cataract    Chest pain 03/28/2018   Depression    Depression with suicidal ideation 03/09/2018   Diarrhea 06/13/2018   GERD (gastroesophageal reflux disease)    History of kidney stones    Hypomagnesemia 06/13/2018   Low back pain 07/22/2012   Migraine    Non-suicidal  self harm as coping mechanism ([place]) 12/25/2019   Oral thrush 06/12/2018   Ovarian cyst    PCOS (polycystic ovarian syndrome)    PTSD (post-traumatic stress disorder)    Scarlet fever    Sleep apnea    Substance abuse ([place])    Suicidal ideation 04/12/2018   Past Surgical History:  Procedure Laterality Date   LUMBAR LAMINECTOMY     TENOLYSIS Left 10/04/2020   Procedure: LEFT ACHILLES TENDON DEBRIDEMENT AND RECONSTRUCTION, CALCANEAL EXOSTECTOMY, GASTROCNEMIUS RECESSION,  FLEXOR HALLUCIS LONGUS TRANSFER;   Surgeon: Erle Crocker, MD;  Location: [place];  Service: Orthopedics;  Laterality: Left;  LENGTH OF SURGERY: 1.5 HOURS   TONSILLECTOMY     Family History  Problem Relation Age of Onset   Thyroid disease Mother    Hypertension Mother    Diabetes Mother    Kidney disease Mother    Depression Mother    Hypertension Father    Diabetes Father    Ovarian cancer Maternal Grandmother    Other Maternal Grandmother        spine cancer   Heart disease Paternal Grandmother    Stroke Paternal Grandfather    Social History   Socioeconomic History   Marital status: Single    Spouse name: Not on file   Number of children: 0   Years of education: Not on file   Highest education level: Bachelor's degree (e.g., BA, AB, BS)  Occupational History   Occupation: unemployed  Tobacco Use   Smoking status: Former    Packs/day: 1.00    Years: 10.00    Total pack years: 10.00    Types: Cigarettes    Quit date: 03/2019    Years since quitting: 3.3   Smokeless tobacco: Never  Vaping Use   Vaping Use: Never used  Substance and Sexual Activity   Alcohol use: Not Currently    Alcohol/week: 20.0 standard drinks of alcohol    Types: 20 Glasses of wine per week   Drug use: Not Currently    Types: Marijuana   Sexual activity: Not Currently  Other Topics Concern   Not on file  Social History Narrative   Not on file   Social Determinants of Health   Financial Resource Strain: Low Risk  (07/10/2022)   Overall Financial Resource Strain (CARDIA)    Difficulty of Paying Living Expenses: Not hard at all  Food Insecurity: No Food Insecurity (07/10/2022)   Hunger Vital Sign    Worried About Running Out of Food in the Last Year: Never true    Ran Out of Food in the Last Year: Never true  Transportation Needs: No Transportation Needs (07/10/2022)   PRAPARE - Hydrologist (Medical): No    Lack of Transportation (Non-Medical): No  Physical Activity: Insufficiently Active  (07/10/2022)   Exercise Vital Sign    Days of Exercise per Week: 3 days    Minutes of Exercise per Session: 40 min  Stress: No Stress Concern Present (07/10/2022)   [place]    Feeling of Stress : Only a little  Social Connections: Moderately Isolated (07/10/2022)   Social Connection and Isolation Panel [NHANES]    Frequency of Communication with Friends and Family: Three times a week    Frequency of Social Gatherings with Friends and Family: More than three times a week    Attends Religious Services: More than 4 times per year    Active Member of [employer]  or Organizations: No    Attends Archivist Meetings: Never    Marital Status: Never married    Tobacco Counseling Counseling given: Not Answered   Clinical Intake:  Pre-visit preparation completed: Yes  Pain : No/denies pain     Nutritional Risks: None Diabetes: No  How often do you need to have someone help you when you read instructions, pamphlets, or other written materials from your doctor or pharmacy?: 1 - Never  Diabetic?no  Interpreter Needed?: No  Information entered by :: Charlott Rakes, LPN   Activities of Daily Living    07/10/2022   11:46 AM  In your present state of health, do you have any difficulty performing the following activities:  Hearing? 0  Vision? 0  Difficulty concentrating or making decisions? 0  Walking or climbing stairs? 0  Dressing or bathing? 0  Doing errands, shopping? 0  Preparing Food and eating ? N  Using the Toilet? N  In the past six months, have you accidently leaked urine? N  Do you have problems with loss of bowel control? N  Managing your Medications? N  Managing your Finances? N  Housekeeping or managing your Housekeeping? N    Patient Care Team: Jeanie Sewer, NP as PCP - General (Family Medicine)  Indicate any recent Medical Services you may have received from other than [facility]  providers in the past year (date may be approximate).     Assessment:   This is a routine wellness examination for Rebekah Davis.  Hearing/Vision screen Hearing Screening - Comments:: Pt denies any hearing issues  Vision Screening - Comments:: [facility] Nadara Mustard  Dietary issues and exercise activities discussed: Current Exercise Habits: Home exercise routine, Type of exercise: walking, Time (Minutes): 45, Frequency (Times/Week): 3, Weekly Exercise (Minutes/Week): 135   Goals Addressed             This Visit's Progress    Patient Stated       Losing weight        Depression Screen    07/10/2022   11:43 AM 06/15/2022    3:56 PM 02/16/2022   11:01 AM 11/13/2021    1:43 PM 09/22/2021    1:17 PM 06/30/2021    1:00 PM 06/05/2021    8:14 AM  PHQ 2/9 Scores  PHQ - 2 Score 1 5 6 2 2 6 2   PHQ- 9 Score  18 20 12 8 20 7     Fall Risk    07/10/2022   11:46 AM 11/13/2021    1:42 PM 10/26/2021    4:07 PM 09/22/2021    1:17 PM 06/30/2021    1:22 PM  Fall Risk   Falls in the past year? 0 0 0 0   Number falls in past yr: 0  0 0   Injury with Fall? 0  0 0   Risk for fall due to : No Fall Risks;Impaired vision;Impaired balance/gait  No Fall Risks  Other (Comment)  Follow up Falls prevention discussed        [place]:  Any stairs in or around the home? Yes  If so, are there any without handrails? No  Home free of loose throw rugs in walkways, pet beds, electrical cords, etc? Yes  Adequate lighting in your home to reduce risk of falls? Yes   ASSISTIVE DEVICES UTILIZED TO PREVENT FALLS:  Life alert? Yes  Use of a cane, walker or w/c? No  Grab bars in the bathroom? No  Shower chair or bench in shower? No  Elevated toilet seat or a handicapped toilet? No   TIMED UP AND GO:  Was the test performed? No .   Cognitive Function:        07/10/2022   11:47 AM 06/30/2021    1:28 PM  6CIT Screen  What Year? 0 points 0 points   What month? 0 points 0 points  What time? 0 points 0 points  Count back from 20 0 points 0 points  Months in reverse 0 points 0 points  Repeat phrase 0 points 0 points  Total Score 0 points 0 points    Immunizations Immunization History  Administered Date(s) Administered   Influenza-Unspecified 07/25/2013   Tdap 04/22/2013, 01/21/2020    TDAP status: Up to date  Flu Vaccine status: Declined, Education has been provided regarding the importance of this vaccine but patient still declined. Advised may receive this vaccine at local pharmacy or Health Dept. Aware to provide a copy of the vaccination record if obtained from local pharmacy or Health Dept. Verbalized acceptance and understanding.  Pneumococcal vaccine status: Declined,  Education has been provided regarding the importance of this vaccine but patient still declined. Advised may receive this vaccine at local pharmacy or Health Dept. Aware to provide a copy of the vaccination record if obtained from local pharmacy or Health Dept. Verbalized acceptance and understanding.   Covid-19 vaccine status: Declined, Education has been provided regarding the importance of this vaccine but patient still declined. Advised may receive this vaccine at local pharmacy or Health Dept.or vaccine clinic. Aware to provide a copy of the vaccination record if obtained from local pharmacy or Health Dept. Verbalized acceptance and understanding.  Qualifies for Shingles Vaccine? No   Zostavax completed No   Shingrix Completed?: No.    Education has been provided regarding the importance of this vaccine. Patient has been advised to call insurance company to determine out of pocket expense if they have not yet received this vaccine. Advised may also receive vaccine at local pharmacy or Health Dept. Verbalized acceptance and understanding.  Screening Tests Health Maintenance  Topic Date Due   Hepatitis C Screening  09/22/2022 (Originally 01/05/2004)   PAP  SMEAR-Modifier  06/28/2025   TETANUS/TDAP  01/20/2030   HIV Screening  Completed   HPV VACCINES  Aged Out   INFLUENZA VACCINE  Discontinued   COVID-19 Vaccine  Discontinued    Health Maintenance  There are no preventive care reminders to display for this patient.   Additional Screening:  Hepatitis C Screening: does qualify;  Vision Screening: Recommended annual ophthalmology exams for early detection of glaucoma and other disorders of the eye. Is the patient up to date with their annual eye exam?  Yes  Who is the provider or what is the name of the office in which the patient attends annual eye exams? Dr Nadara Mustard If pt is not established with a provider, would they like to be referred to a provider to establish care? No .   Dental Screening: Recommended annual dental exams for proper oral hygiene  Community Resource Referral / Chronic Care Management: CRR required this visit?  No   CCM required this visit?  No      Plan:     I have personally reviewed and noted the following in the patient's chart:   Medical and social history Use of alcohol, tobacco or illicit drugs  Current medications and supplements including opioid prescriptions.  Patient is not currently taking opioid prescriptions. Functional ability and status Nutritional status Physical activity Advanced directives List of other physicians Hospitalizations, surgeries, and ER visits in previous 12 months Vitals Screenings to include cognitive, depression, and falls Referrals and appointments  In addition, I have reviewed and discussed with patient certain preventive protocols, quality metrics, and best practice recommendations. A written personalized care plan for preventive services as well as general preventive health recommendations were provided to patient.     Rebekah Brace, LPN   [id number]   Nurse Notes: none

## 2022-07-11 ENCOUNTER — Ambulatory Visit: Payer: Managed Care, Other (non HMO)

## 2022-07-11 ENCOUNTER — Telehealth: Payer: Self-pay

## 2022-07-11 NOTE — Telephone Encounter (Signed)
LVM regarding increase in late cancellations (<24 hrs notice) and gave information regarding attendance policy. Going forward patient will only be able to schedule one appointment at a time.   Margarette Canada, PTA 07/11/22 5:36 PM

## 2022-07-17 ENCOUNTER — Ambulatory Visit: Payer: Medicare Other

## 2022-07-18 ENCOUNTER — Ambulatory Visit: Payer: Medicare Other

## 2022-07-30 ENCOUNTER — Encounter: Payer: Self-pay | Admitting: Dietician

## 2022-07-30 ENCOUNTER — Encounter: Payer: Medicare Other | Attending: Family | Admitting: Dietician

## 2022-07-30 DIAGNOSIS — Z6841 Body Mass Index (BMI) 40.0 and over, adult: Secondary | ICD-10-CM | POA: Diagnosis not present

## 2022-07-30 DIAGNOSIS — Z713 Dietary counseling and surveillance: Secondary | ICD-10-CM | POA: Diagnosis not present

## 2022-07-30 DIAGNOSIS — K219 Gastro-esophageal reflux disease without esophagitis: Secondary | ICD-10-CM | POA: Diagnosis not present

## 2022-07-30 NOTE — Progress Notes (Signed)
Medical Nutrition Therapy  Appointment Start time:  [phone]  Appointment End time:  1745  Primary concerns today: Pt states she took a nutrition class in college and wants to learn more about nutrition because it has been a long time. She states she wants to be healthier but doesn't know how.    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 Medications: reviewed Labs: reviewed Notable Signs/Symptoms: cramping in calves (only a few times a year) Food allergies: cherries  Lifestyle & Dietary Hx  Pt reports that she eats out a lot and she states she does not know how to cook which she feels inhibits her from eating healthier.   Pt states she quit smoking cigarettes in 2020.   Pt states she is involved in church and fellowship and finds this really important. She goes out to eat with people in her fellowship a few nights per week.    Pt had surgery on foot in 2021, and since then if she does a lot of walking it swells a lot and has less range of motion, so this inhibits some exercise but pt is getting a gym membership.   Pt gets 1 hour for lunch break and this week she went walking after lunch with a friend one day.   Pt feels that she eats too much for dinner when she eats out. She states she feels that she is eating for pleasure past fullness.   Pt reports occasionally skipping meals.   Pt states she stopped drinking sodas and teas a month and a half ago and made a goal to drink 80 oz water daily.   Estimated daily fluid intake: aims for 80 oz Supplements: none Sleep: 5 hours on weekends, 8 hours during the week.  Stress / self-care: moderate stress Current average weekly physical activity: ADLs  24-Hr Dietary Recall First Meal: skips OR fast food OR cereal OR peanut  butter on tortilla Snack: none Second Meal: 12pm: sandwich with [place], spinach, tomato, olives with tangerine OR fast food chickfila strips with fries OR moes chicken and veggies quesadilla OR sushi Snack: none Third Meal: take-out panera broccoli and cheddar soup OR bbq sandwich OR sub [place] avocado sub from [employer] OR skips Snack: none Beverages: coffee with cream and sugar, 60-80 oz water, occasional sweet tea    NUTRITION DIAGNOSIS  NB-1.1 Food and nutrition-related knowledge deficit As related to E66.01.  As evidenced by pt report and diet history.   NUTRITION INTERVENTION  Nutrition education (E-1) on the following topics:  MyPlate Balance of protein, carbohydrate, fruits and vegetables Breakdown of food groups Saturated vs unsaturated fat Sources of lean protein Importance of physical activity Hydration status Building balanced snacks Negative impacts of skipping meals and importance of eating consistently  Handouts Provided Include  Dish Out a Healthy Meal Snack Suggestions  Learning Style & Readiness for Change Teaching method utilized: Visual & Auditory  Demonstrated degree of understanding via: Teach Back  Barriers to learning/adherence to lifestyle change: none  Goals Established by Pt Consider having your vitamin D checked again.   Aim to get 150 minutes of physical activity weekly. -stationary bike, elliptical, weights  Eat more Non-Starchy Vegetables and Fruits.  When snacking, pair a protein and carbohydrate.  Minimize added sugars and refined grains. Rethink what you drink. Choose beverages without added sugar. Look  for 0 carbs on the label.  Make simple meals at home more often than eating out.  Avoid skipping meals.  Aim to eat within an hour of waking up, and every 3-5 hours following.  Think about MyPlate when eating out!   MONITORING & EVALUATION Dietary intake, weekly physical activity, and follow up in two months.  Next Steps   Patient is to call for questions.

## 2022-07-30 NOTE — Patient Instructions (Addendum)
Consider having your vitamin D checked again.   Aim to get 150 minutes of physical activity weekly. -stationary bike, elliptical, weights  Eat more Non-Starchy Vegetables and Fruits.  When snacking, pair a protein and carbohydrate.  Minimize added sugars and refined grains. Rethink what you drink. Choose beverages without added sugar. Look for 0 carbs on the label.  Make simple meals at home more often than eating out.  Avoid skipping meals.  Aim to eat within an hour of waking up, and every 3-5 hours following.  Think about MyPlate when eating out!

## 2022-08-02 ENCOUNTER — Other Ambulatory Visit: Payer: Self-pay | Admitting: Family

## 2022-08-02 DIAGNOSIS — F419 Anxiety disorder, unspecified: Secondary | ICD-10-CM

## 2022-08-03 MED ORDER — HYDROXYZINE HCL 10 MG PO TABS: 10.0000 mg | ORAL_TABLET | Freq: Three times a day (TID) | ORAL | 5 refills | Status: DC | PRN

## 2022-08-03 MED ORDER — ALBUTEROL SULFATE HFA 108 (90 BASE) MCG/ACT IN AERS: 2.0000 | INHALATION_SPRAY | RESPIRATORY_TRACT | 1 refills | Status: DC | PRN

## 2022-08-03 NOTE — Telephone Encounter (Signed)
Ok to refill both with 5 refills - Atarax, send 60 pills with 5 refills - thx

## 2022-08-09 ENCOUNTER — Ambulatory Visit (INDEPENDENT_AMBULATORY_CARE_PROVIDER_SITE_OTHER): Payer: Medicare Other

## 2022-08-09 ENCOUNTER — Ambulatory Visit: Payer: Medicare Other | Admitting: Obstetrics & Gynecology

## 2022-08-09 ENCOUNTER — Encounter: Payer: Self-pay | Admitting: Obstetrics & Gynecology

## 2022-08-09 VITALS — BP 124/80 | HR 119

## 2022-08-09 DIAGNOSIS — N83202 Unspecified ovarian cyst, left side: Secondary | ICD-10-CM

## 2022-08-09 DIAGNOSIS — R102 Pelvic and perineal pain: Secondary | ICD-10-CM | POA: Diagnosis not present

## 2022-08-09 NOTE — Progress Notes (Signed)
    Rebekah Davis May 06, 1986 [id number]        36 y.o.  G0   RP: Pelvic US for f/u Left Ovarian Cyst   HPI: Left ovarian cyst 3.9 cm on Pelvic US in 05/2022.  Intermittent lower abdominal pain, unchanged x 05/2022.  Menses regular normal, LMP 07/17/22.  Abstinent.   OB History  Gravida Para Term Preterm AB Living  0 0 0 0 0 0  SAB IAB Ectopic Multiple Live Births  0 0 0 0 0    Past medical history,surgical history, problem list, medications, allergies, family history and social history were all reviewed and documented in the EPIC chart.   Directed ROS with pertinent positives and negatives documented in the history of present illness/assessment and plan.  Exam:  Vitals:   08/09/22 1212  BP: 124/80  Pulse: (!) 119  SpO2: 99%   General appearance:  Normal  Pelvic US today: Comparison is made with previous outside ultrasound on June 13, 2022.  T/V images.  Anteverted uterus normal in size and shape with no myometrial mass.  The uterus is measured at 5.8 x 5.07 x 5.39 cm.  Symmetrical sec. endometrium measuring approximately 10 mm with no obvious mass or thickening seen.  Right ovary normal in size with normal follicular pattern.  Left simple avascular ovarian cyst with thin walls increased in size since the previous scan measured at 6.7 x 5.5 x 4.3 cm.  Small amount of free fluid in the pelvis.   Assessment/Plan:  36 y.o. G0  1. Left ovarian cyst Left ovarian cyst 3.9 cm on Pelvic US in 05/2022.  Intermittent lower abdominal pain, unchanged x 05/2022.  Menses regular normal, LMP 07/17/22.  Abstinent.  Possibly mildly symptomatic left ovarian cyst with intermittent lower abdominal discomfort.  Benign appearing left ovarian cyst which is simple, avascular, with thin walls.  Patient offered laparoscopy with left ovarian cystectomy, but declined at this time.  Also declined starting on birth control pills to lower the risk of functional ovarian cysts.  Prefers to observe.  Precautions  thoroughly reviewed with patient for ovarian cyst rupture and ovarian torsion.  Will be evaluated here promptly or at the emergency room if develops acute severe pelvic pain.  Otherwise we will follow-up with a repeat pelvic ultrasound in 3 months. - US Transvaginal Non-OB; Future   Rebekah Bruins MD, 12:17 PM 08/09/2022

## 2022-08-10 ENCOUNTER — Encounter: Payer: Self-pay | Admitting: Obstetrics & Gynecology

## 2022-08-10 DIAGNOSIS — N83202 Unspecified ovarian cyst, left side: Secondary | ICD-10-CM

## 2022-08-13 ENCOUNTER — Encounter: Payer: Self-pay | Admitting: Obstetrics & Gynecology

## 2022-08-15 ENCOUNTER — Encounter (HOSPITAL_BASED_OUTPATIENT_CLINIC_OR_DEPARTMENT_OTHER): Payer: Self-pay | Admitting: Emergency Medicine

## 2022-08-15 ENCOUNTER — Emergency Department (HOSPITAL_BASED_OUTPATIENT_CLINIC_OR_DEPARTMENT_OTHER)
Admission: EM | Admit: 2022-08-15 | Discharge: 2022-08-16 | Disposition: A | Discharge status: Home / Self Care | Payer: Managed Care, Other (non HMO) | Attending: Emergency Medicine | Admitting: Emergency Medicine

## 2022-08-15 ENCOUNTER — Other Ambulatory Visit: Payer: Self-pay

## 2022-08-15 ENCOUNTER — Other Ambulatory Visit: Payer: Medicare Other

## 2022-08-15 DIAGNOSIS — D72829 Elevated white blood cell count, unspecified: Secondary | ICD-10-CM | POA: Diagnosis not present

## 2022-08-15 DIAGNOSIS — N83202 Unspecified ovarian cyst, left side: Secondary | ICD-10-CM | POA: Diagnosis not present

## 2022-08-15 DIAGNOSIS — R1032 Left lower quadrant pain: Secondary | ICD-10-CM | POA: Diagnosis present

## 2022-08-15 LAB — URINALYSIS, ROUTINE W REFLEX MICROSCOPIC
Bilirubin Urine: NEGATIVE
Glucose, UA: NEGATIVE mg/dL
Ketones, ur: NEGATIVE mg/dL
Nitrite: NEGATIVE
Specific Gravity, Urine: 1.031 — ABNORMAL HIGH (ref 1.005–1.030)
pH: 6 (ref 5.0–8.0)

## 2022-08-15 LAB — CBC
HCT: 45.1 % (ref 36.0–46.0)
Hemoglobin: 15.4 g/dL — ABNORMAL HIGH (ref 12.0–15.0)
MCH: 31.5 pg (ref 26.0–34.0)
MCHC: 34.1 g/dL (ref 30.0–36.0)
MCV: 92.2 fL (ref 80.0–100.0)
Platelets: 274 10*3/uL (ref 150–400)
RBC: 4.89 MIL/uL (ref 3.87–5.11)
RDW: 12.4 % (ref 11.5–15.5)
WBC: 12.2 10*3/uL — ABNORMAL HIGH (ref 4.0–10.5)
nRBC: 0 % (ref 0.0–0.2)

## 2022-08-15 LAB — PREGNANCY, URINE: Preg Test, Ur: NEGATIVE

## 2022-08-15 LAB — COMPREHENSIVE METABOLIC PANEL
ALT: 26 U/L (ref 0–44)
AST: 18 U/L (ref 15–41)
Albumin: 4.4 g/dL (ref 3.5–5.0)
Alkaline Phosphatase: 66 U/L (ref 38–126)
Anion gap: 10 (ref 5–15)
BUN: 14 mg/dL (ref 6–20)
CO2: 26 mmol/L (ref 22–32)
Calcium: 9.1 mg/dL (ref 8.9–10.3)
Chloride: 104 mmol/L (ref 98–111)
Creatinine, Ser: 0.98 mg/dL (ref 0.44–1.00)
GFR, Estimated: >60 mL/min (ref >60)
Glucose, Bld: 94 mg/dL (ref 70–99)
Potassium: 3.5 mmol/L (ref 3.5–5.1)
Sodium: 140 mmol/L (ref 135–145)
Total Bilirubin: 0.5 mg/dL (ref 0.3–1.2)
Total Protein: 7.2 g/dL (ref 6.5–8.1)

## 2022-08-15 LAB — LIPASE, BLOOD: Lipase: 21 U/L (ref 11–51)

## 2022-08-15 NOTE — ED Triage Notes (Signed)
Patient c/o sharp left side lower abdominal pain that radiates into back. Has documented ovarian cyst and was advised to seek treatment if experiencing these symptoms.  Started yesterday ans worsening

## 2022-08-15 NOTE — ED Provider Notes (Signed)
[facility]  Provider Note  CSN: [id number] Arrival date & time: 08/15/22 2028  History Chief Complaint  Patient presents with   Abdominal Pain    Rebekah Davis is a 36 y.o. female with a known L ovarian cyst, followed by Gyn, reports sudden onset of sharp, severe pain in L pelvis earlier this evening. She was instructed to come to the ED if that happens. Pain has improved since then. Not currently having severe pain. No fever or dysuria.    Home Medications Prior to Admission medications   Medication Sig Start Date End Date Taking? Authorizing Provider  acetaminophen (TYLENOL) 500 MG tablet Take 1,000 mg by mouth 2 (two) times daily as needed (pain).    [provider]  albuterol (VENTOLIN HFA) 108 (90 Base) MCG/ACT inhaler Inhale 2 puffs into the lungs as needed for wheezing or shortness of breath. 08/03/22   Jeanie Sewer, NP  ARIPiprazole (ABILIFY) 20 MG tablet Take 1 tablet (20 mg total) by mouth daily. 09/03/21 09/03/22  Vevelyn Francois, NP  escitalopram (LEXAPRO) 10 MG tablet TAKE 1 TABLET(10 MG) BY MOUTH DAILY 06/29/22   Jeanie Sewer, NP  hydrOXYzine (ATARAX) 10 MG tablet Take 1 tablet (10 mg total) by mouth 3 (three) times daily as needed. 08/03/22   Jeanie Sewer, NP  ibuprofen (ADVIL) 200 MG tablet Take 400 mg by mouth 2 (two) times daily as needed (pain).    [provider]     Allergies    Adhesive [tape], Cherry, Sulfamethoxazole-trimethoprim, Levofloxacin, Morphine, Gabapentin, Metformin, and Sumatriptan   Review of Systems   Review of Systems Please see HPI for pertinent positives and negatives  Physical Exam BP 121/72 (BP Location: Right Arm)   Pulse 89   Temp 98.2 F (36.8 C) (Oral)   Resp 20   LMP 08/14/2022 (Exact Date)   SpO2 100%   Physical Exam Vitals and nursing note reviewed.  Constitutional:      Appearance: Normal appearance.  HENT:     Head: Normocephalic and atraumatic.      Nose: Nose normal.     Mouth/Throat:     Mouth: Mucous membranes are moist.  Eyes:     Extraocular Movements: Extraocular movements intact.     Conjunctiva/sclera: Conjunctivae normal.  Cardiovascular:     Rate and Rhythm: Normal rate.  Pulmonary:     Effort: Pulmonary effort is normal.     Breath sounds: Normal breath sounds.  Abdominal:     General: Abdomen is flat.     Palpations: Abdomen is soft.     Tenderness: There is abdominal tenderness in the left lower quadrant. There is no guarding. Negative signs include Murphy's sign and McBurney's sign.  Musculoskeletal:        General: No swelling. Normal range of motion.     Cervical back: Neck supple.  Skin:    General: Skin is warm and dry.  Neurological:     General: No focal deficit present.     Mental Status: She is alert.  Psychiatric:        Mood and Affect: Mood normal.     ED Results / Procedures / Treatments   EKG None  Procedures Procedures  Medications Ordered in the ED Medications - No data to display  Initial Impression and Plan  Patient here with pelvic pain in setting of known ovarian cyst. Pain is improved now. Consider rupture vs intermittent torsion. Labs done in triage showed CBC with mild leukocytosis, UA is  neg for infection. CMP/Lipase are normal. Will send for [place].   ED Course   Clinical Course as of 08/16/22 0154  Thu Aug 16, 2022  0151 I personally viewed the images from radiology studies and agree with radiologist interpretation:  US shows cyst unchanged from last week without any complications. Recommend she continue with OTC pain meds, follouwp with Gyn for long term management.  [CS]    Clinical Course User Index [CS] Pollyann Savoy, MD     MDM Rules/Calculators/A&P Medical Decision Making Problems Addressed: Cyst of left ovary: chronic illness or injury with exacerbation, progression, or side effects of treatment  Amount and/or Complexity of Data Reviewed Labs: ordered.  Decision-making details documented in ED Course. Radiology: ordered and independent interpretation performed. Decision-making details documented in ED Course.  Risk OTC drugs.    Final Clinical Impression(s) / ED Diagnoses Final diagnoses:  Cyst of left ovary    Rx / DC Orders ED Discharge Orders     None        Pollyann Savoy, MD 08/16/22 [phone]

## 2022-08-15 NOTE — Telephone Encounter (Signed)
Dr.Lavoie replied "Please get patient a copy of her Pelvic US result if still not accessible through [facility].  Can order a Ca 125 and do it at patient's convenience.  Can refer to [facility]. "

## 2022-08-16 ENCOUNTER — Telehealth: Payer: Self-pay | Admitting: *Deleted

## 2022-08-16 ENCOUNTER — Emergency Department (HOSPITAL_BASED_OUTPATIENT_CLINIC_OR_DEPARTMENT_OTHER): Payer: Managed Care, Other (non HMO)

## 2022-08-16 DIAGNOSIS — N83202 Unspecified ovarian cyst, left side: Secondary | ICD-10-CM | POA: Diagnosis not present

## 2022-08-16 LAB — CA 125: CA 125: 6 U/mL (ref <35)

## 2022-08-16 NOTE — ED Notes (Signed)
Pt back in room from ultrasound via wheelchair by ultrasound tech.

## 2022-08-16 NOTE — Telephone Encounter (Signed)
Dr.Lavoie replied "Ibuprofen 800 mg PO every 8 hours x next 5 days.  Can send a prescription if patient prefers 1 tab of 800 mg.  Maintain the repeat Pelvic US in 3 months.  If the pains is not tolerable after the 5 days of Ibuprofen, can schedule a preop visit with me for XI Robotic Left Ovarian Cystectomy at [facility]. "  Patient report she will take ibuprofen OTC and use dose 800 mg tablet every 8 hours for 5 days.

## 2022-08-16 NOTE — Telephone Encounter (Signed)
Patient called was seen in ER last night for pelvic pain (notes in epic) patient said the pain is still there today comes and goes. Pressure that then turns into left side sharp pain, report when pain is sharp she feels like she could vomit (but has not) . ER provider told her to take tylenol with ibuprofen she reports doing that and it helped some but pain still there. She reports her cycle started today as well 11/2/2. What would like advice about what to do about the pain? She is are work now, but more than likely leave due to pain. Please advise

## 2022-08-16 NOTE — ED Notes (Signed)
Patient transported to Ultrasound 

## 2022-08-22 ENCOUNTER — Telehealth: Payer: Self-pay

## 2022-08-22 NOTE — Telephone Encounter (Signed)
        Patient  visited [facility] on 11/2     Telephone encounter attempt :  1st  A HIPAA compliant voice message was left requesting a return call.  Instructed patient to call back     Lenard Forth [facility] Guide, [facility], Care Management  [phone] [address] [place], [place], [place] [address] Phone: [phone] Email: [email or web address]

## 2022-08-29 ENCOUNTER — Encounter (HOSPITAL_BASED_OUTPATIENT_CLINIC_OR_DEPARTMENT_OTHER): Payer: Self-pay

## 2022-08-29 ENCOUNTER — Emergency Department (HOSPITAL_BASED_OUTPATIENT_CLINIC_OR_DEPARTMENT_OTHER)
Admission: EM | Admit: 2022-08-29 | Discharge: 2022-08-30 | Disposition: A | Discharge status: Home / Self Care | Payer: No Typology Code available for payment source | Attending: Emergency Medicine | Admitting: Emergency Medicine

## 2022-08-29 ENCOUNTER — Other Ambulatory Visit: Payer: Self-pay

## 2022-08-29 DIAGNOSIS — R1032 Left lower quadrant pain: Secondary | ICD-10-CM | POA: Insufficient documentation

## 2022-08-29 DIAGNOSIS — Y92410 Unspecified street and highway as the place of occurrence of the external cause: Secondary | ICD-10-CM | POA: Diagnosis not present

## 2022-08-29 LAB — URINALYSIS, ROUTINE W REFLEX MICROSCOPIC
Bilirubin Urine: NEGATIVE
Glucose, UA: NEGATIVE mg/dL
Ketones, ur: NEGATIVE mg/dL
Leukocytes,Ua: NEGATIVE
Nitrite: NEGATIVE
Protein, ur: NEGATIVE mg/dL
Specific Gravity, Urine: 1.017 (ref 1.005–1.030)
pH: 5.5 (ref 5.0–8.0)

## 2022-08-29 LAB — CBC
HCT: 42.2 % (ref 36.0–46.0)
Hemoglobin: 14.4 g/dL (ref 12.0–15.0)
MCH: 31 pg (ref 26.0–34.0)
MCHC: 34.1 g/dL (ref 30.0–36.0)
MCV: 90.9 fL (ref 80.0–100.0)
Platelets: 299 10*3/uL (ref 150–400)
RBC: 4.64 MIL/uL (ref 3.87–5.11)
RDW: 12.3 % (ref 11.5–15.5)
WBC: 11.7 10*3/uL — ABNORMAL HIGH (ref 4.0–10.5)
nRBC: 0 % (ref 0.0–0.2)

## 2022-08-29 LAB — COMPREHENSIVE METABOLIC PANEL
ALT: 31 U/L (ref 0–44)
AST: 18 U/L (ref 15–41)
Albumin: 4.1 g/dL (ref 3.5–5.0)
Alkaline Phosphatase: 68 U/L (ref 38–126)
Anion gap: 11 (ref 5–15)
BUN: 11 mg/dL (ref 6–20)
CO2: 26 mmol/L (ref 22–32)
Calcium: 9.4 mg/dL (ref 8.9–10.3)
Chloride: 104 mmol/L (ref 98–111)
Creatinine, Ser: 0.89 mg/dL (ref 0.44–1.00)
GFR, Estimated: >60 mL/min (ref >60)
Glucose, Bld: 150 mg/dL — ABNORMAL HIGH (ref 70–99)
Potassium: 3.4 mmol/L — ABNORMAL LOW (ref 3.5–5.1)
Sodium: 141 mmol/L (ref 135–145)
Total Bilirubin: 0.5 mg/dL (ref 0.3–1.2)
Total Protein: 6.8 g/dL (ref 6.5–8.1)

## 2022-08-29 LAB — PREGNANCY, URINE: Preg Test, Ur: NEGATIVE

## 2022-08-29 LAB — LIPASE, BLOOD: Lipase: 15 U/L (ref 11–51)

## 2022-08-29 MED ORDER — ACETAMINOPHEN 325 MG PO TABS
650.0000 mg | ORAL_TABLET | Freq: Once | ORAL | Status: AC
  Administered 2022-08-30: 650 mg via ORAL
  Filled 2022-08-29: qty 2

## 2022-08-29 NOTE — ED Notes (Signed)
Pt refusing vitals at this time.
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
## 2022-08-29 NOTE — ED Notes (Signed)
Pt not placed on the monitor per her request. RN notified.

## 2022-08-30 ENCOUNTER — Emergency Department (HOSPITAL_BASED_OUTPATIENT_CLINIC_OR_DEPARTMENT_OTHER)
Admission: EM | Admit: 2022-08-30 | Discharge: 2022-08-30 | Disposition: A | Discharge status: Home / Self Care | Payer: Managed Care, Other (non HMO) | Attending: Emergency Medicine | Admitting: Emergency Medicine

## 2022-08-30 ENCOUNTER — Emergency Department (HOSPITAL_BASED_OUTPATIENT_CLINIC_OR_DEPARTMENT_OTHER): Payer: Managed Care, Other (non HMO)

## 2022-08-30 ENCOUNTER — Encounter (HOSPITAL_BASED_OUTPATIENT_CLINIC_OR_DEPARTMENT_OTHER): Payer: Self-pay | Admitting: Emergency Medicine

## 2022-08-30 ENCOUNTER — Telehealth: Payer: Self-pay | Admitting: *Deleted

## 2022-08-30 DIAGNOSIS — R109 Unspecified abdominal pain: Secondary | ICD-10-CM | POA: Diagnosis present

## 2022-08-30 DIAGNOSIS — N83202 Unspecified ovarian cyst, left side: Secondary | ICD-10-CM | POA: Insufficient documentation

## 2022-08-30 LAB — COMPREHENSIVE METABOLIC PANEL
ALT: 34 U/L (ref 0–44)
AST: 24 U/L (ref 15–41)
Albumin: 4.6 g/dL (ref 3.5–5.0)
Alkaline Phosphatase: 75 U/L (ref 38–126)
Anion gap: 14 (ref 5–15)
BUN: 13 mg/dL (ref 6–20)
CO2: 23 mmol/L (ref 22–32)
Calcium: 9.5 mg/dL (ref 8.9–10.3)
Chloride: 105 mmol/L (ref 98–111)
Creatinine, Ser: 0.92 mg/dL (ref 0.44–1.00)
GFR, Estimated: >60 mL/min (ref >60)
Glucose, Bld: 82 mg/dL (ref 70–99)
Potassium: 3.9 mmol/L (ref 3.5–5.1)
Sodium: 142 mmol/L (ref 135–145)
Total Bilirubin: 0.6 mg/dL (ref 0.3–1.2)
Total Protein: 7.7 g/dL (ref 6.5–8.1)

## 2022-08-30 LAB — CBC WITH DIFFERENTIAL/PLATELET
Abs Immature Granulocytes: 0.05 10*3/uL (ref 0.00–0.07)
Basophils Absolute: 0.1 10*3/uL (ref 0.0–0.1)
Basophils Relative: 1 %
Eosinophils Absolute: 0.1 10*3/uL (ref 0.0–0.5)
Eosinophils Relative: 1 %
HCT: 44.5 % (ref 36.0–46.0)
Hemoglobin: 15.1 g/dL — ABNORMAL HIGH (ref 12.0–15.0)
Immature Granulocytes: 1 %
Lymphocytes Relative: 30 %
Lymphs Abs: 3 10*3/uL (ref 0.7–4.0)
MCH: 31.2 pg (ref 26.0–34.0)
MCHC: 33.9 g/dL (ref 30.0–36.0)
MCV: 91.9 fL (ref 80.0–100.0)
Monocytes Absolute: 0.5 10*3/uL (ref 0.1–1.0)
Monocytes Relative: 5 %
Neutro Abs: 6.2 10*3/uL (ref 1.7–7.7)
Neutrophils Relative %: 62 %
Platelets: 300 10*3/uL (ref 150–400)
RBC: 4.84 MIL/uL (ref 3.87–5.11)
RDW: 12.1 % (ref 11.5–15.5)
WBC: 9.9 10*3/uL (ref 4.0–10.5)
nRBC: 0 % (ref 0.0–0.2)

## 2022-08-30 NOTE — Discharge Instructions (Addendum)
Manage pain with over the counter tylenol or ibuprofen and warm compresses.

## 2022-08-30 NOTE — ED Provider Notes (Signed)
[facility] EM[facility] DEPT Provider Note   CSN: [id number] Arrival date & time: 08/30/22  [id number]     History  Chief Complaint  Patient presents with   Abdominal Pain    Rebekah Davis is a 36 y.o. female.  Patient with history of recurrent abdominal pain and ovarian cyst presents with abdominal pain. Patient was seen yesterday in ER for similar symptoms and reports that symptoms have progressively worsened since that time. She reports speaking with her OB who recommended she come into ER primarily to have US performed of LLQ due to concern for enlarging ovarian cyst and to rule out possible ovarian torsion. She reports associated abdominal pain and limited mobility due to pain, but denies current fever, diarrhea, vomiting, chest pain, or shortness of breath. Stated that pain is similar to prior ovarian cysts but worse than normal.   Abdominal Pain Associated symptoms: no chest pain, no chills, no cough, no diarrhea, no dysuria, no fever, no hematuria, no nausea, no shortness of breath and no vomiting        Home Medications Prior to Admission medications   Medication Sig Start Date End Date Taking? Authorizing Provider  acetaminophen (TYLENOL) 500 MG tablet Take 1,000 mg by mouth 2 (two) times daily as needed (pain).    [provider]  albuterol (VENTOLIN HFA) 108 (90 Base) MCG/ACT inhaler Inhale 2 puffs into the lungs as needed for wheezing or shortness of breath. 08/03/22   Jeanie Sewer, NP  ARIPiprazole (ABILIFY) 20 MG tablet Take 1 tablet (20 mg total) by mouth daily. 09/03/21 09/03/22  Vevelyn Francois, NP  escitalopram (LEXAPRO) 10 MG tablet TAKE 1 TABLET(10 MG) BY MOUTH DAILY 06/29/22   Jeanie Sewer, NP  hydrOXYzine (ATARAX) 10 MG tablet Take 1 tablet (10 mg total) by mouth 3 (three) times daily as needed. 08/03/22   Jeanie Sewer, NP  ibuprofen (ADVIL) 200 MG tablet Take 400 mg by mouth 2 (two) times daily as needed (pain).    [provider]      Allergies    Adhesive [tape], Cherry, Sulfamethoxazole-trimethoprim, Levofloxacin, Morphine, Gabapentin, Metformin, and Sumatriptan    Review of Systems   Review of Systems  Constitutional:  Negative for appetite change, chills and fever.  Respiratory:  Negative for cough and shortness of breath.   Cardiovascular:  Negative for chest pain.  Gastrointestinal:  Positive for abdominal pain. Negative for diarrhea, nausea and vomiting.  Genitourinary:  Positive for pelvic pain. Negative for dysuria, flank pain and hematuria.  Skin:  Negative for pallor, rash and wound.  All other systems reviewed and are negative.   Physical Exam Updated Vital Signs BP (!) 158/83   Pulse 86   Temp 98 F (36.7 C) (Oral)   Resp 18   LMP 08/14/2022 (Exact Date)   SpO2 100%  Physical Exam Constitutional:      Appearance: She is well-developed.  HENT:     Head: Normocephalic and atraumatic.  Eyes:     Extraocular Movements: Extraocular movements intact.  Cardiovascular:     Rate and Rhythm: Normal rate and regular rhythm.  Pulmonary:     Effort: Pulmonary effort is normal.     Breath sounds: Normal breath sounds.  Abdominal:     General: Abdomen is protuberant. Bowel sounds are normal. There is no distension. There are no signs of injury.     Palpations: Abdomen is soft. There is no hepatomegaly, splenomegaly or mass.     Tenderness: There is abdominal tenderness in  the left lower quadrant. There is no rebound.     Hernia: No hernia is present.  Skin:    General: Skin is warm and dry.     Findings: No erythema or rash.  Neurological:     Mental Status: She is alert.  Psychiatric:        Mood and Affect: Mood normal.     ED Results / Procedures / Treatments   Labs (all labs ordered are listed, but only abnormal results are displayed) Labs Reviewed  CBC WITH DIFFERENTIAL/PLATELET - Abnormal; Notable for the following components:      Result Value   Hemoglobin 15.1  (*)    All other components within normal limits  COMPREHENSIVE METABOLIC PANEL  CBC notable for improved WBC compared to CBC from 08/29/2022. CMP normal. Negative pregnancy from 08/29/2022 noted.  EKG None  Radiology US PELVIC COMPLETE W TRANSVAGINAL AND TORSION R/O  Result Date: 08/30/2022 CLINICAL DATA:  Left lower quadrant abdominal pain. EXAM: TRANSABDOMINAL AND TRANSVAGINAL ULTRASOUND OF PELVIS DOPPLER ULTRASOUND OF OVARIES TECHNIQUE: Both transabdominal and transvaginal ultrasound examinations of the pelvis were performed. Transabdominal technique was performed for global imaging of the pelvis including uterus, ovaries, adnexal regions, and pelvic cul-de-sac. It was necessary to proceed with endovaginal exam following the transabdominal exam to visualize the endometrium and ovaries. Color and duplex Doppler ultrasound was utilized to evaluate blood flow to the ovaries. COMPARISON:  August 16, 2022. FINDINGS: Uterus Measurements: 8.0 x 4.0 x 4.0 cm = volume: 67 mL. No fibroids or other mass visualized. Endometrium Thickness: 6 mm which is within normal limits. No focal abnormality visualized. Right ovary Not visualized. Left ovary Measurements: 7.2 x 7.3 x 5.1 cm = volume: 139 mL. 6.5 cm simple cyst is noted. Pulsed Doppler evaluation of left ovary demonstrates normal low-resistance arterial and venous waveforms. Other findings No abnormal free fluid. IMPRESSION: Right ovary is not visualized. No evidence of torsion involving left ovary. 6.5 cm left ovarian cyst is noted. Recommend follow-up US in 3-6 months. Note: This recommendation does not apply to premenarchal patients or to those with increased risk (genetic, family history, elevated tumor markers or other high-risk factors) of ovarian cancer. Reference: Radiology 2019 Nov; 293(2):359-371. Electronically Signed   By: Lupita Raider M.D.   On: 08/30/2022 15:03    Procedures Procedures   Medications Ordered in ED Medications - No data  to display  ED Course/ Medical Decision Making/ A&P         Glasgow Coma Scale Score: 15      NEXUS Criteria Score: 0            Medical Decision Making [name] is a 36 yo female with left lower abdominal pain and history of ovarian cyst. Patient was seen on 08/29/2022 due to LLQ pain with workup determining cyst of left ovary without evidence of torsion. Patient denied fever, nausea, vomiting, vaginal discharge, or dysuria. Patient's clinical presenting consistent with uncomplicated ovarian cyst.  Differential diagnosis not limited to but including: PID, diverticulosis, diverticulitis, bowel obstruction, and appendicitis.  Patient reassessed with improvement in pain and felt reassured with US findings.  Amount and/or Complexity of Data Reviewed Radiology: ordered and independent interpretation performed.    Details: US pelvic reviewed showing stable cyst of left ovary without evidence of torsion.   Final Clinical Impression(s) / ED Diagnoses Final diagnoses:  Cyst of left ovary    Rx / DC Orders ED Discharge Orders     None  Luvenia Heller, PA-C 08/30/22 1528    Fransico Meadow, MD 09/03/22 1140
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
## 2022-08-30 NOTE — Telephone Encounter (Signed)
Just FYI patient returned back to ER.

## 2022-08-31 ENCOUNTER — Ambulatory Visit (INDEPENDENT_AMBULATORY_CARE_PROVIDER_SITE_OTHER): Payer: Medicare Other | Admitting: Obstetrics & Gynecology

## 2022-08-31 ENCOUNTER — Encounter: Payer: Self-pay | Admitting: Obstetrics & Gynecology

## 2022-08-31 VITALS — BP 118/64 | HR 100

## 2022-08-31 DIAGNOSIS — N83202 Unspecified ovarian cyst, left side: Secondary | ICD-10-CM

## 2022-08-31 NOTE — Progress Notes (Signed)
    Rebekah Davis 1986-09-27 [id number]        36 y.o.  G0  RP: F/U from ER for pelvic pain/Lt simple Ovarian Cyst  HPI: Since visit with me on 08/09/22, patient was well with no pelvic pain.  Started having pain yesterday and presented to the ER where a Pelvic US was repeated.  Now better again with no current pelvic pain.  Menses regular normal.  LMP 08/14/22.  Declines BCPs, worried she would be allergic to it.  Pelvic US 08/09/22 here: Left simple avascular ovarian cyst with thin walls increased in size since the previous scan measured at 6.7 x 5.5 x 4.3 cm.  Small amount of free fluid in the pelvis.   OB History  Gravida Para Term Preterm AB Living  0 0 0 0 0 0  SAB IAB Ectopic Multiple Live Births  0 0 0 0 0    Past medical history,surgical history, problem list, medications, allergies, family history and social history were all reviewed and documented in the EPIC chart.   Directed ROS with pertinent positives and negatives documented in the history of present illness/assessment and plan.  Exam:  Vitals:   08/31/22 1354  BP: 118/64  Pulse: 100  SpO2: 98%   General appearance:  Normal  Gynecologic exam: Deferred.  No pain currently.  Pelvic US 08/30/22: Uterus   Measurements: 8.0 x 4.0 x 4.0 cm = volume: 67 mL. No fibroids or other mass visualized.   Endometrium   Thickness: 6 mm which is within normal limits. No focal abnormality visualized.   Right ovary   Not visualized.   Left ovary   Measurements: 7.2 x 7.3 x 5.1 cm = volume: 139 mL. 6.5 cm simple cyst is noted.   Pulsed Doppler evaluation of left ovary demonstrates normal low-resistance arterial and venous waveforms.   Other findings   No abnormal free fluid.   IMPRESSION: Right ovary is not visualized.   No evidence of torsion involving left ovary.   6.5 cm left ovarian cyst is noted. Recommend follow-up US in 3-6 months.     Assessment/Plan:  36 y.o. G0P0000   1. Left simple ovarian  cyst  Since visit with me on 08/09/22, patient was well with no pelvic pain.  Started having pain yesterday and presented to the ER where a Pelvic US was repeated.  Now better again with no current pelvic pain.  Menses regular normal.  LMP 08/14/22.  Declines BCPs, worried she would be allergic to it.  Pelvic US on 08/30/22 showing a simple left ovarian cyst slightly smaller at 6.5 cm with no evidence of torsion.  Decision to observe.  Will take Advil 800 mg every 8 hours for dysmenorrhea or recurrence of pelvic pain.  Precautions for ovarian cyst rupture and ovarian torsion discussed.  F/U repeat Pelvic US here in 3 months.  Genia Del MD, 2:12 PM 08/31/2022

## 2022-09-03 ENCOUNTER — Encounter: Payer: Self-pay | Admitting: Obstetrics & Gynecology

## 2022-10-25 ENCOUNTER — Other Ambulatory Visit: Payer: Managed Care, Other (non HMO) | Admitting: Obstetrics & Gynecology

## 2022-10-25 ENCOUNTER — Other Ambulatory Visit: Payer: Medicare Other

## 2022-10-26 ENCOUNTER — Encounter: Payer: Self-pay | Admitting: Family

## 2022-10-26 ENCOUNTER — Telehealth: Payer: Self-pay | Admitting: Family

## 2022-10-26 ENCOUNTER — Ambulatory Visit (INDEPENDENT_AMBULATORY_CARE_PROVIDER_SITE_OTHER): Payer: Medicare Other | Admitting: Family

## 2022-10-26 VITALS — BP 173/83 | HR 139 | Temp 98.2°F | Ht 65.0 in | Wt 344.1 lb

## 2022-10-26 DIAGNOSIS — F332 Major depressive disorder, recurrent severe without psychotic features: Secondary | ICD-10-CM | POA: Diagnosis not present

## 2022-10-26 DIAGNOSIS — F410 Panic disorder [episodic paroxysmal anxiety] without agoraphobia: Secondary | ICD-10-CM

## 2022-10-26 DIAGNOSIS — F064 Anxiety disorder due to known physiological condition: Secondary | ICD-10-CM

## 2022-10-26 MED ORDER — ARIPIPRAZOLE 5 MG PO TABS: 5.0000 mg | ORAL_TABLET | Freq: Every day | ORAL | 0 refills | Status: DC

## 2022-10-26 NOTE — Telephone Encounter (Signed)
Final disposition: See PCP within 24 hours; Pt has been scheduled for 10/26/22 @ 2:20pm w/ Jeanie Sewer.  Patient Name: Rebekah Davis Gender: Female DOB: 03-28-1986 Age: 37 Y 9 M 20 D Return Phone Number: [phone] (Primary), [phone] (Secondary) Address: City/ State/ Zip: [place] [address] Client [facility] Client Site [facility] Contact Type Call Who Is Calling Patient / Member / Family / Caregiver Call Type Triage / Clinical Relationship To Patient Self Return Phone Number [phone] (Primary) Chief Complaint Dizziness Reason for Call Symptomatic / Request for [place] states she has been without her mental health medications for a few days. She is now back on them. c/o dizziness or a floating feeling that comes and goes. Translation No Nurse Assessment Nurse: Mariel Sleet, RN, Erasmo Downer Date/Time (Eastern Time): 10/26/2022 11:32:13 AM Confirm and document reason for call. If symptomatic, describe symptoms. ---Caller states that she is having some dizziness since she started her medication again. Worse yesterday and today. Does the patient have any new or worsening symptoms? ---Yes Will a triage be completed? ---Yes Related visit to physician within the last 2 weeks? ---Yes Does the PT have any chronic conditions? (i.e. diabetes, asthma, this includes High risk factors for pregnancy, etc.) ---Yes List chronic conditions. ---mental health Is the patient pregnant or possibly pregnant? (Ask all females between the ages of 72-55) ---No Is this a behavioral health or substance abuse call? ---No Guidelines Guideline Title Affirmed Question Affirmed Notes Nurse Date/Time (Eastern Time) Dizziness - Lightheadedness [1] MODERATE dizziness (e.g., interferes with normal activities) AND [2] has NOT been evaluated by Mariel Sleet, RN, Erasmo Downer 10/26/2022  11:33:28 AM PLEASE NOTE: All timestamps contained within this report are represented as [place] Standard Time. CONFIDENTIALTY NOTICE: This fax transmission is intended only for the addressee. It contains information that is legally privileged, confidential or otherwise protected from use or disclosure. If you are not the intended recipient, you are strictly prohibited from reviewing, disclosing, copying using or disseminating any of this information or taking any action in reliance on or regarding this information. If you have received this fax in error, please notify us immediately by telephone so that we can arrange for its return to [place]. Phone: [phone], Toll-Free: [phone], Fax: [phone] Page: 2 of 2 Call Id: [id number] Guidelines Guideline Title Affirmed Question Affirmed Notes Nurse Date/Time Eilene Ghazi Time) doctor (or NP/PA) for this (Exception: Dizziness caused by heat exposure, sudden standing, or poor fluid intake.) Disp. Time Eilene Ghazi Time) Disposition Final User 10/26/2022 11:36:19 AM See PCP within 24 Hours Yes Mariel Sleet, RN, Erasmo Downer Final Disposition 10/26/2022 11:36:19 AM See PCP within 24 Hours Yes Mariel Sleet, RN, Laurin Coder Disagree/Comply Comply Caller Understands Yes PreDisposition Call Doctor Care Advice Given Per Guideline CALL BACK IF: * You become worse CARE ADVICE given per Dizziness (Adult) guideline. SEE PCP WITHIN 24 HOURS: * IF OFFICE WILL BE OPEN: You need to be examined within the next 24 hours. Call your doctor (or NP/PA) when the office opens and make an appointment. DRINK FLUIDS: * Drink several glasses of fruit juice, other clear fluids or water. * Passes out (faints) Referrals REFERRED TO PCP OFFICE

## 2022-10-26 NOTE — Progress Notes (Signed)
Patient ID: Rebekah Davis, female    DOB: 02-20-1986, 37 y.o.   MRN: [id number]  Chief Complaint  Patient presents with   Dizziness    Pt c/o dizziness, diarrhea and a floating feeling that comes and goes. Started Tuesday but has been getting better.    HPI:      Pt reports multiple sides effects and stopped her meds thinking they were causing her sx. She ran out of refills and had to stop her meds, then restarted but then forgot them when she went out of town for a few days. Reports restarting same doses on Tuesday, and since has felt out of sorts, worried she shouldn't have restarted the meds at the same high doses. She has a phobia with medicine but has not addressed this directly with her [facility] or therapist, they have been talking about everything else.   Assessment & Plan:  1. MDD (major depressive disorder), recurrent severe, without psychosis ([place]) - *** - ARIPiprazole (ABILIFY) 5 MG tablet; Take 1 tablet (5 mg total) by mouth daily. Take 1 pill for 3 days, then 2 pills for 3 days, then 3 pills for 3 days then resume your 20mg  pills.  Dispense: 15 tablet; Refill: 0  2. Anxiety disorder due to general medical condition with panic attack - ***    Subjective:    Outpatient Medications Prior to Visit  Medication Sig Dispense Refill   acetaminophen (TYLENOL) 500 MG tablet Take 1,000 mg by mouth 2 (two) times daily as needed (pain).     albuterol (VENTOLIN HFA) 108 (90 Base) MCG/ACT inhaler Inhale 2 puffs into the lungs as needed for wheezing or shortness of breath. 6.7 g 1   escitalopram (LEXAPRO) 10 MG tablet TAKE 1 TABLET(10 MG) BY MOUTH DAILY 30 tablet 2   hydrOXYzine (ATARAX) 10 MG tablet Take 1 tablet (10 mg total) by mouth 3 (three) times daily as needed. 60 tablet 5   ibuprofen (ADVIL) 200 MG tablet Take 400 mg by mouth 2 (two) times daily as needed (pain).     ARIPiprazole (ABILIFY) 20 MG tablet Take 1 tablet (20 mg total) by mouth daily. 30 tablet 11   No  facility-administered medications prior to visit.   Past Medical History:  Diagnosis Date   Abdominal pain, suprapubic 06/04/2016   Abnormal Pap smear of cervix    Acute cystitis 03/10/2018   Anxiety    Arthritis    C. difficile diarrhea 06/12/2018   Cataract    Chest pain 03/28/2018   Depression    Depression with suicidal ideation 03/09/2018   Diarrhea 06/13/2018   GERD (gastroesophageal reflux disease)    History of kidney stones    Hypomagnesemia 06/13/2018   Low back pain 07/22/2012   Migraine    Non-suicidal self harm as coping mechanism ([place]) 12/25/2019   Oral thrush 06/12/2018   Ovarian cyst    PCOS (polycystic ovarian syndrome)    PTSD (post-traumatic stress disorder)    Scarlet fever    Sleep apnea    Substance abuse ([place])    Suicidal ideation 04/12/2018   Past Surgical History:  Procedure Laterality Date   LUMBAR LAMINECTOMY     TENOLYSIS Left 10/04/2020   Procedure: LEFT ACHILLES TENDON DEBRIDEMENT AND RECONSTRUCTION, CALCANEAL EXOSTECTOMY, GASTROCNEMIUS RECESSION,  FLEXOR HALLUCIS LONGUS TRANSFER;  Surgeon: Erle Crocker, MD;  Location: [facility];  Service: Orthopedics;  Laterality: Left;  LENGTH OF SURGERY: 1.5 HOURS   TONSILLECTOMY     Allergies  Allergen Reactions  Adhesive [Tape] Other (See Comments)    blister   Cherry Anaphylaxis and Rash   Sulfamethoxazole-Trimethoprim Hives   Levofloxacin     Other reaction(s): Muscle Pain, Other (See Comments)   Morphine     Other reaction(s): Other (See Comments) Feels like unable to breath or swallow    Gabapentin Rash   Metformin Nausea And Vomiting    Other reaction(s): Other (See Comments) diaphoresis    Sumatriptan Diarrhea and Nausea And Vomiting    Other reaction(s): Other (See Comments) Decreased heart rate and respiratory rate        Objective:    Physical Exam Vitals and nursing note reviewed.  Constitutional:      Appearance: Normal appearance. She is obese.  Cardiovascular:      Rate and Rhythm: Normal rate and regular rhythm.  Pulmonary:     Effort: Pulmonary effort is normal.     Breath sounds: Normal breath sounds.  Musculoskeletal:        General: Normal range of motion.  Skin:    General: Skin is warm and dry.  Neurological:     Mental Status: She is alert.  Psychiatric:        Mood and Affect: Mood normal.        Behavior: Behavior normal.    BP (!) 173/83 (BP Location: Left Arm, Patient Position: Sitting, Cuff Size: Large)   Pulse (!) 139   Temp 98.2 F (36.8 C) (Temporal)   Ht 5\' 5"  (1.651 m)   Wt (!) 344 lb 2 oz (156.1 kg)   LMP 10/14/2022 (Exact Date)   SpO2 100%   BMI 57.27 kg/m  Wt Readings from Last 3 Encounters:  10/26/22 (!) 344 lb 2 oz (156.1 kg)  08/29/22 (!) 340 lb (154.2 kg)  07/30/22 (!) 341 lb 1.6 oz (154.7 kg)       Jeanie Sewer, NP

## 2022-10-26 NOTE — Telephone Encounter (Signed)
Patient states: -Recently restarted psychiatric medications, Abilify 20 mg and lexapro 10 mg after missing a few days worth of doses  - Currently feels a floating feeling that she describes as dizziness  Patient has been transferred to triage

## 2022-10-26 NOTE — Telephone Encounter (Signed)
Patient states: - Triage nurse informed her to be seen by PCP within 24 hours   I was able to get patient scheduled on 10/26/22 @ 2:20pm w/ Colletta [place] Rebekah Davis.

## 2022-11-06 ENCOUNTER — Encounter: Payer: Self-pay | Admitting: Obstetrics & Gynecology

## 2022-11-06 DIAGNOSIS — N83202 Unspecified ovarian cyst, left side: Secondary | ICD-10-CM

## 2022-11-06 DIAGNOSIS — R1032 Left lower quadrant pain: Secondary | ICD-10-CM

## 2022-11-07 NOTE — Telephone Encounter (Signed)
Rebekah Davis  You9 minutes ago (3:59 PM)   CS There are no ultrasounds available before January 31 in our office.     Dr. Marguerita Merles- Do you want to see if we can schedule her u/s outside office?

## 2022-11-07 NOTE — Telephone Encounter (Signed)
Per ML: "Yes outside Pelvic US."

## 2022-11-08 ENCOUNTER — Ambulatory Visit (HOSPITAL_BASED_OUTPATIENT_CLINIC_OR_DEPARTMENT_OTHER)
Admission: RE | Admit: 2022-11-08 | Discharge: 2022-11-08 | Disposition: A | Discharge status: Home / Self Care | Payer: Managed Care, Other (non HMO) | Source: Ambulatory Visit | Attending: Obstetrics & Gynecology | Admitting: Obstetrics & Gynecology

## 2022-11-08 ENCOUNTER — Encounter (HOSPITAL_BASED_OUTPATIENT_CLINIC_OR_DEPARTMENT_OTHER): Payer: Self-pay

## 2022-11-08 DIAGNOSIS — N83202 Unspecified ovarian cyst, left side: Secondary | ICD-10-CM | POA: Diagnosis not present

## 2022-11-08 DIAGNOSIS — R1032 Left lower quadrant pain: Secondary | ICD-10-CM | POA: Insufficient documentation

## 2022-11-08 NOTE — Telephone Encounter (Signed)
Per ML: "Please put the Pelvic US report on my desk as soon as available for review."

## 2022-11-08 NOTE — Telephone Encounter (Signed)
Pt scheduled today @ [facility] for [place] @ 2pm. How would you like to proceed as far as relaying results to pt? Please advise. Thanks.

## 2022-11-09 ENCOUNTER — Other Ambulatory Visit: Payer: Self-pay

## 2022-11-09 NOTE — Progress Notes (Signed)
error 

## 2022-11-09 NOTE — Telephone Encounter (Signed)
Results received and results/recommendations relayed to pt. Will close encounter.

## 2022-11-13 NOTE — Telephone Encounter (Addendum)
On 11/09/22: Netta Corrigan, CMA  [name], MD Would you like the patient to have any follow up appts or [place] visits for the Lt Ovarian Cyst? Please advise. Thanks."  Per ML: "[name], MD  Netta Corrigan, CMA Can schedule a repeat Pelvic US with me in 3 months. Dr L"  11/12/2022: Elicia Lamp, Dondra Prader, CMA Left message for patient to call and schedule appointment."

## 2022-11-20 ENCOUNTER — Encounter (HOSPITAL_COMMUNITY): Payer: Self-pay | Admitting: Registered Nurse

## 2022-11-20 ENCOUNTER — Ambulatory Visit (HOSPITAL_COMMUNITY)
Admission: EM | Admit: 2022-11-20 | Discharge: 2022-11-21 | Disposition: A | Discharge status: Home / Self Care | Payer: Managed Care, Other (non HMO) | Attending: Registered Nurse | Admitting: Registered Nurse

## 2022-11-20 DIAGNOSIS — Z87440 Personal history of urinary (tract) infections: Secondary | ICD-10-CM | POA: Diagnosis not present

## 2022-11-20 DIAGNOSIS — R000 Tachycardia, unspecified: Secondary | ICD-10-CM | POA: Diagnosis not present

## 2022-11-20 DIAGNOSIS — F332 Major depressive disorder, recurrent severe without psychotic features: Secondary | ICD-10-CM | POA: Diagnosis present

## 2022-11-20 DIAGNOSIS — F419 Anxiety disorder, unspecified: Secondary | ICD-10-CM | POA: Diagnosis not present

## 2022-11-20 DIAGNOSIS — Z1152 Encounter for screening for COVID-19: Secondary | ICD-10-CM | POA: Diagnosis not present

## 2022-11-20 DIAGNOSIS — F4310 Post-traumatic stress disorder, unspecified: Secondary | ICD-10-CM | POA: Diagnosis not present

## 2022-11-20 DIAGNOSIS — N83209 Unspecified ovarian cyst, unspecified side: Secondary | ICD-10-CM | POA: Diagnosis not present

## 2022-11-20 DIAGNOSIS — F603 Borderline personality disorder: Secondary | ICD-10-CM | POA: Diagnosis not present

## 2022-11-20 DIAGNOSIS — Z7289 Other problems related to lifestyle: Secondary | ICD-10-CM

## 2022-11-20 DIAGNOSIS — Z9152 Personal history of nonsuicidal self-harm: Secondary | ICD-10-CM | POA: Insufficient documentation

## 2022-11-20 DIAGNOSIS — Z79899 Other long term (current) drug therapy: Secondary | ICD-10-CM | POA: Diagnosis not present

## 2022-11-20 LAB — CBC WITH DIFFERENTIAL/PLATELET
Abs Immature Granulocytes: 0.05 10*3/uL (ref 0.00–0.07)
Basophils Absolute: 0.1 10*3/uL (ref 0.0–0.1)
Basophils Relative: 1 %
Eosinophils Absolute: 0.1 10*3/uL (ref 0.0–0.5)
Eosinophils Relative: 1 %
HCT: 42.4 % (ref 36.0–46.0)
Hemoglobin: 14.4 g/dL (ref 12.0–15.0)
Immature Granulocytes: 0 %
Lymphocytes Relative: 24 %
Lymphs Abs: 2.8 10*3/uL (ref 0.7–4.0)
MCH: 31.2 pg (ref 26.0–34.0)
MCHC: 34 g/dL (ref 30.0–36.0)
MCV: 92 fL (ref 80.0–100.0)
Monocytes Absolute: 0.5 10*3/uL (ref 0.1–1.0)
Monocytes Relative: 4 %
Neutro Abs: 8.4 10*3/uL — ABNORMAL HIGH (ref 1.7–7.7)
Neutrophils Relative %: 70 %
Platelets: 309 10*3/uL (ref 150–400)
RBC: 4.61 MIL/uL (ref 3.87–5.11)
RDW: 12.3 % (ref 11.5–15.5)
WBC: 11.9 10*3/uL — ABNORMAL HIGH (ref 4.0–10.5)
nRBC: 0 % (ref 0.0–0.2)

## 2022-11-20 LAB — LIPID PANEL
Cholesterol: 184 mg/dL (ref 0–200)
HDL: 45 mg/dL (ref >40)
LDL Cholesterol: 118 mg/dL — ABNORMAL HIGH (ref 0–99)
Total CHOL/HDL Ratio: 4.1 RATIO
Triglycerides: 105 mg/dL (ref <150)
VLDL: 21 mg/dL (ref 0–40)

## 2022-11-20 LAB — POCT URINE DRUG SCREEN - MANUAL ENTRY (I-SCREEN)
POC Amphetamine UR: NOT DETECTED
POC Buprenorphine (BUP): NOT DETECTED
POC Cocaine UR: NOT DETECTED
POC Marijuana UR: NOT DETECTED
POC Methadone UR: NOT DETECTED
POC Methamphetamine UR: NOT DETECTED
POC Morphine: NOT DETECTED
POC Oxazepam (BZO): NOT DETECTED
POC Oxycodone UR: NOT DETECTED
POC Secobarbital (BAR): NOT DETECTED

## 2022-11-20 LAB — URINALYSIS, ROUTINE W REFLEX MICROSCOPIC
Bilirubin Urine: NEGATIVE
Glucose, UA: NEGATIVE mg/dL
Ketones, ur: NEGATIVE mg/dL
Nitrite: NEGATIVE
Protein, ur: NEGATIVE mg/dL
Specific Gravity, Urine: 1.024 (ref 1.005–1.030)
pH: 6 (ref 5.0–8.0)

## 2022-11-20 LAB — COMPREHENSIVE METABOLIC PANEL
ALT: 33 U/L (ref 0–44)
AST: 22 U/L (ref 15–41)
Albumin: 3.6 g/dL (ref 3.5–5.0)
Alkaline Phosphatase: 74 U/L (ref 38–126)
Anion gap: 11 (ref 5–15)
BUN: 14 mg/dL (ref 6–20)
CO2: 25 mmol/L (ref 22–32)
Calcium: 9.2 mg/dL (ref 8.9–10.3)
Chloride: 103 mmol/L (ref 98–111)
Creatinine, Ser: 1 mg/dL (ref 0.44–1.00)
GFR, Estimated: >60 mL/min (ref >60)
Glucose, Bld: 145 mg/dL — ABNORMAL HIGH (ref 70–99)
Potassium: 3.5 mmol/L (ref 3.5–5.1)
Sodium: 139 mmol/L (ref 135–145)
Total Bilirubin: 0.2 mg/dL — ABNORMAL LOW (ref 0.3–1.2)
Total Protein: 6.4 g/dL — ABNORMAL LOW (ref 6.5–8.1)

## 2022-11-20 LAB — TSH: TSH: 1.426 u[IU]/mL (ref 0.350–4.500)

## 2022-11-20 LAB — RESP PANEL BY RT-PCR (RSV, FLU A&B, COVID)  RVPGX2
Influenza A by PCR: NEGATIVE
Influenza B by PCR: NEGATIVE
Resp Syncytial Virus by PCR: NEGATIVE
SARS Coronavirus 2 by RT PCR: NEGATIVE

## 2022-11-20 LAB — HEMOGLOBIN A1C
Hgb A1c MFr Bld: 4.7 % — ABNORMAL LOW (ref 4.8–5.6)
Mean Plasma Glucose: 88.19 mg/dL

## 2022-11-20 LAB — MAGNESIUM: Magnesium: 2 mg/dL (ref 1.7–2.4)

## 2022-11-20 LAB — POCT PREGNANCY, URINE: Preg Test, Ur: NEGATIVE

## 2022-11-20 LAB — POC SARS CORONAVIRUS 2 AG: SARSCOV2ONAVIRUS 2 AG: NEGATIVE

## 2022-11-20 LAB — ETHANOL: Alcohol, Ethyl (B): <10 mg/dL (ref <10)

## 2022-11-20 MED ORDER — ESCITALOPRAM OXALATE 10 MG PO TABS
10.0000 mg | ORAL_TABLET | Freq: Every day | ORAL | Status: DC
  Administered 2022-11-21: 10 mg via ORAL
  Filled 2022-11-20 (×2): qty 1

## 2022-11-20 MED ORDER — ARIPIPRAZOLE 10 MG PO TABS
10.0000 mg | ORAL_TABLET | Freq: Every day | ORAL | Status: DC
  Administered 2022-11-21: 10 mg via ORAL
  Filled 2022-11-20 (×2): qty 1

## 2022-11-20 MED ORDER — MAGNESIUM HYDROXIDE 400 MG/5ML PO SUSP: 30.0000 mL | Freq: Every day | ORAL | Status: DC | PRN

## 2022-11-20 MED ORDER — ACETAMINOPHEN 325 MG PO TABS: 650.0000 mg | ORAL_TABLET | Freq: Four times a day (QID) | ORAL | Status: DC | PRN

## 2022-11-20 MED ORDER — HYDROXYZINE HCL 25 MG PO TABS
25.0000 mg | ORAL_TABLET | Freq: Three times a day (TID) | ORAL | Status: DC | PRN
  Administered 2022-11-20: 25 mg via ORAL
  Filled 2022-11-20: qty 1

## 2022-11-20 MED ORDER — ALUM & MAG HYDROXIDE-SIMETH 200-200-20 MG/5ML PO SUSP
30.0000 mL | ORAL | Status: DC | PRN
  Administered 2022-11-20: 30 mL via ORAL
  Filled 2022-11-20: qty 30

## 2022-11-20 NOTE — Social Work (Signed)
Patient is wanting help with medication denies HI, drug use reports SI however no plan feels confused. Patient is scared to take her medication. Well groomed.

## 2022-11-20 NOTE — ED Provider Notes (Signed)
[facility] Continuous Assessment Admission H&P  Date: 11/20/22 Patient Name: Rebekah Davis MRN: [id number] Chief Complaint: Depression, anxiety, medication management  Diagnoses:  Final diagnoses:  MDD (major depressive disorder), recurrent severe, without psychosis ([place])  PTSD (post-traumatic stress disorder)  Borderline personality disorder ([place])  Anxiety  Deliberate self-cutting    HPI: Rebekah Davis [age] y.o., female patient presented to [facility] as walk in with complaints of worsening anxiety, depression, and needing help with medication management.    Patient seen face to face by this provider, consulted with Dr. Hampton Abbot; and chart reviewed on 11/20/22.  On evaluation Rebekah Davis reports she has chronic history with anxiety and depression with staying over the last two days things are really getting bad. Patient reports in December she stopped her medications for 6 days and by January her depression was worsening.  States that she has been restarted on her medications, but a new medication was recently added "And I just feel really scared to start a new medicine when there is no one around to watch me.  Patient states that she has a roommate, but they don't see each other often related to working opposite shifts "It's almost like living alone."  Patient denies suicidal/homicidal ideation, psychosis, and paranoia.  She states that she does have a history of self-harm and it had been 6 yrs. since she had cut but states she made a very superficial cut to her leg last night.  Patient states she has outpatient psychiatric services with [facility] and her next scheduled appointment is in 2 weeks.    States her primary support is "Rebekah Davis" and I called to let him know I was coming here.   Patient states that she feels she just needs someone to watch her start the Vistaril "I know that it is to help me with my anxiety and probably won't hurt me, but I just have this fear of starting medications  when I'm alone."     During evaluation Rebekah Davis is sitting in chair with no noted distress.  She is alert/oriented x 4, calm, cooperative, and attentive.  Her responses were appropriated to assessment questions.  Her mood is anxious and depressed with congruent affect.  She spoke in a clear tone at moderate volume, and normal pace, with good eye contact.   She denies suicidal/self-harm/homicidal ideation, psychosis, and paranoia.  Objectively:  there is no evidence of psychosis/mania or delusional thinking.  She conversed coherently, with goal directed thoughts, and no distractibility, or pre-occupation and she has denied suicidal/self-harm/homicidal ideation, psychosis, and paranoia.  Total Time spent with patient: 30 minutes  Musculoskeletal  Strength & Muscle Tone: within normal limits Gait & Station: normal Patient leans: N/A  Psychiatric Specialty Exam  Presentation General Appearance:  Appropriate for Environment; Casual  Eye Contact: Good  Speech: Clear and Coherent; Normal Rate  Speech Volume: Normal  Handedness: Right   Mood and Affect  Mood: Anxious; Dysphoric  Affect: Congruent; Tearful   Thought Process  Thought Processes: Coherent; Goal Directed  Descriptions of Associations:Intact  Orientation:Full (Time, Place and Person)  Thought Content:Logical  Diagnosis of Schizophrenia or Schizoaffective disorder in past: No   Hallucinations:Hallucinations: None  Ideas of Reference:None  Suicidal Thoughts:Suicidal Thoughts: No  Homicidal Thoughts:Homicidal Thoughts: No   Sensorium  Memory: Immediate Good; Recent Good; Remote Good  Judgment: Intact  Insight: Present   Executive Functions  Concentration: Good  Attention Span: Good  Recall: Good  Fund of Knowledge: Good  Language: Good  Psychomotor Activity  Psychomotor Activity: Psychomotor Activity: Normal   Assets  Assets: Communication Skills; Desire for  Improvement; Financial Resources/Insurance; Housing; Resilience; Social Support   Sleep  Sleep: Sleep: Fair   Nutritional Assessment (For OBS and FBC admissions only) Has the patient had a weight loss or gain of 10 pounds or more in the last 3 months?: No Has the patient had a decrease in food intake/or appetite?: No Does the patient have dental problems?: No Does the patient have eating habits or behaviors that may be indicators of an eating disorder including binging or inducing vomiting?: No Has the patient recently lost weight without trying?: 0 Has the patient been eating poorly because of a decreased appetite?: 0 Malnutrition Screening Tool Score: 0    Physical Exam Vitals and nursing note reviewed.  Constitutional:      General: She is not in acute distress.    Appearance: Normal appearance. She is obese. She is not ill-appearing.  HENT:     Head: Normocephalic.  Eyes:     Pupils: Pupils are equal, round, and reactive to light.  Cardiovascular:     Rate and Rhythm: Normal rate.  Pulmonary:     Effort: Pulmonary effort is normal.  Musculoskeletal:        General: Normal range of motion.     Cervical back: Normal range of motion.  Skin:    General: Skin is warm and dry.  Neurological:     Mental Status: She is alert and oriented to person, place, and time.    Review of Systems  Psychiatric/Behavioral:  Positive for depression. Negative for hallucinations, substance abuse and suicidal ideas. The patient is nervous/anxious.   All other systems reviewed and are negative.   Last menstrual period 10/14/2022. There is no height or weight on file to calculate BMI.  Past Psychiatric History:   Patient Active Problem List   Diagnosis Date Noted   Passive suicidal ideations 11/16/2020   Irregular bleeding 04/12/2020   Achilles tendinitis 03/11/2020   Ovarian cyst 02/19/2020   Deliberate self-cutting 01/23/2020   Transaminitis 06/13/2018   Obesity 03/28/2018    GERD (gastroesophageal reflux disease) 03/10/2018   PTSD (post-traumatic stress disorder) 03/09/2018   MDD (major depressive disorder), recurrent severe, without psychosis ([place]) 02/27/2018   Polycystic disease, ovaries 07/05/2016   Spondylisthesis 04/20/2015   Herniated lumbar intervertebral disc 02/28/2013   Sciatica of left side 02/28/2013   Borderline personality disorder ([place]) 12/29/2012   Anxiety 12/24/2012   Essential hypertension 12/24/2012      Is the patient at risk to self? No  Has the patient been a risk to self in the past 6 months? No .    Has the patient been a risk to self within the distant past? No   Is the patient a risk to others? No   Has the patient been a risk to others in the past 6 months? No   Has the patient been a risk to others within the distant past? No   Past Medical History:  Past Medical History:  Diagnosis Date   Abdominal pain, suprapubic 06/04/2016   Abnormal Pap smear of cervix    Acute cystitis 03/10/2018   Anxiety    Arthritis    C. difficile diarrhea 06/12/2018   Cataract    Chest pain 03/28/2018   Depression    Depression with suicidal ideation 03/09/2018   Diarrhea 06/13/2018   GERD (gastroesophageal reflux disease)    History of kidney stones  Hypomagnesemia 06/13/2018   Low back pain 07/22/2012   Migraine    Non-suicidal self harm as coping mechanism ([place]) 12/25/2019   Oral thrush 06/12/2018   Ovarian cyst    PCOS (polycystic ovarian syndrome)    PTSD (post-traumatic stress disorder)    Scarlet fever    Sleep apnea    Substance abuse ([place])    Suicidal ideation 04/12/2018     Family History:  Family History  Problem Relation Age of Onset   Thyroid disease Mother    Hypertension Mother    Diabetes Mother    Kidney disease Mother    Depression Mother    Hypertension Father    Diabetes Father    Ovarian cancer Maternal Grandmother    Other Maternal Grandmother        spine cancer   Heart disease Paternal  Grandmother    Stroke Paternal Grandfather      Social History:  Social History   Tobacco Use   Smoking status: Former    Packs/day: 1.00    Years: 10.00    Total pack years: 10.00    Types: Cigarettes    Quit date: 03/2019    Years since quitting: 3.6   Smokeless tobacco: Never  Vaping Use   Vaping Use: Never used  Substance Use Topics   Alcohol use: Not Currently   Drug use: Not Currently     Last Labs:  Admission on 08/30/2022, Discharged on 08/30/2022  Component Date Value Ref Range Status   Sodium 08/30/2022 142  135 - 145 mmol/L Final   Potassium 08/30/2022 3.9  3.5 - 5.1 mmol/L Final   Chloride 08/30/2022 105  98 - 111 mmol/L Final   CO2 08/30/2022 23  22 - 32 mmol/L Final   Glucose, Bld 08/30/2022 82  70 - 99 mg/dL Final   Glucose reference range applies only to samples taken after fasting for at least 8 hours.   BUN 08/30/2022 13  6 - 20 mg/dL Final   Creatinine, Ser 08/30/2022 0.92  0.44 - 1.00 mg/dL Final   Calcium 08/30/2022 9.5  8.9 - 10.3 mg/dL Final   Total Protein 08/30/2022 7.7  6.5 - 8.1 g/dL Final   Albumin 08/30/2022 4.6  3.5 - 5.0 g/dL Final   AST 08/30/2022 24  15 - 41 U/L Final   ALT 08/30/2022 34  0 - 44 U/L Final   Alkaline Phosphatase 08/30/2022 75  38 - 126 U/L Final   Total Bilirubin 08/30/2022 0.6  0.3 - 1.2 mg/dL Final   GFR, Estimated 08/30/2022 >60  >60 mL/min Final   Comment: (NOTE) Calculated using the CKD-EPI Creatinine Equation (2021)    Anion gap 08/30/2022 14  5 - 15 Final   Performed at [facility], [address], [place], [place] [address]   WBC 08/30/2022 9.9  4.0 - 10.5 K/uL Final   RBC 08/30/2022 4.84  3.87 - 5.11 MIL/uL Final   Hemoglobin 08/30/2022 15.1 (H)  12.0 - 15.0 g/dL Final   HCT 08/30/2022 44.5  36.0 - 46.0 % Final   MCV 08/30/2022 91.9  80.0 - 100.0 fL Final   MCH 08/30/2022 31.2  26.0 - 34.0 pg Final   MCHC 08/30/2022 33.9  30.0 - 36.0 g/dL Final   RDW 08/30/2022 12.1  11.5 - 15.5 %  Final   Platelets 08/30/2022 300  150 - 400 K/uL Final   nRBC 08/30/2022 0.0  0.0 - 0.2 % Final   Neutrophils Relative % 08/30/2022 62  %  Final   Neutro Abs 08/30/2022 6.2  1.7 - 7.7 K/uL Final   Lymphocytes Relative 08/30/2022 30  % Final   Lymphs Abs 08/30/2022 3.0  0.7 - 4.0 K/uL Final   Monocytes Relative 08/30/2022 5  % Final   Monocytes Absolute 08/30/2022 0.5  0.1 - 1.0 K/uL Final   Eosinophils Relative 08/30/2022 1  % Final   Eosinophils Absolute 08/30/2022 0.1  0.0 - 0.5 K/uL Final   Basophils Relative 08/30/2022 1  % Final   Basophils Absolute 08/30/2022 0.1  0.0 - 0.1 K/uL Final   Immature Granulocytes 08/30/2022 1  % Final   Abs Immature Granulocytes 08/30/2022 0.05  0.00 - 0.07 K/uL Final   Performed at [facility], [address] [place], [place] [address]  Admission on 08/29/2022, Discharged on 08/30/2022  Component Date Value Ref Range Status   Lipase 08/29/2022 15  11 - 51 U/L Final   Performed at [facility], [address] [place], [place] [address]   Sodium 08/29/2022 141  135 - 145 mmol/L Final   Potassium 08/29/2022 3.4 (L)  3.5 - 5.1 mmol/L Final   Chloride 08/29/2022 104  98 - 111 mmol/L Final   CO2 08/29/2022 26  22 - 32 mmol/L Final   Glucose, Bld 08/29/2022 150 (H)  70 - 99 mg/dL Final   Glucose reference range applies only to samples taken after fasting for at least 8 hours.   BUN 08/29/2022 11  6 - 20 mg/dL Final   Creatinine, Ser 08/29/2022 0.89  0.44 - 1.00 mg/dL Final   Calcium 08/29/2022 9.4  8.9 - 10.3 mg/dL Final   Total Protein 08/29/2022 6.8  6.5 - 8.1 g/dL Final   Albumin 08/29/2022 4.1  3.5 - 5.0 g/dL Final   AST 08/29/2022 18  15 - 41 U/L Final   ALT 08/29/2022 31  0 - 44 U/L Final   Alkaline Phosphatase 08/29/2022 68  38 - 126 U/L Final   Total Bilirubin 08/29/2022 0.5  0.3 - 1.2 mg/dL Final   GFR, Estimated 08/29/2022 >60  >60 mL/min Final   Comment: (NOTE) Calculated using the CKD-EPI  Creatinine Equation (2021)    Anion gap 08/29/2022 11  5 - 15 Final   Performed at [facility], [address], [place], [place] [address]   WBC 08/29/2022 11.7 (H)  4.0 - 10.5 K/uL Final   RBC 08/29/2022 4.64  3.87 - 5.11 MIL/uL Final   Hemoglobin 08/29/2022 14.4  12.0 - 15.0 g/dL Final   HCT 08/29/2022 42.2  36.0 - 46.0 % Final   MCV 08/29/2022 90.9  80.0 - 100.0 fL Final   MCH 08/29/2022 31.0  26.0 - 34.0 pg Final   MCHC 08/29/2022 34.1  30.0 - 36.0 g/dL Final   RDW 08/29/2022 12.3  11.5 - 15.5 % Final   Platelets 08/29/2022 299  150 - 400 K/uL Final   nRBC 08/29/2022 0.0  0.0 - 0.2 % Final   Performed at [facility], [address], [place], [place] [address]   Color, Urine 08/29/2022 YELLOW  YELLOW Final   APPearance 08/29/2022 CLEAR  CLEAR Final   Specific Gravity, Urine 08/29/2022 1.017  1.005 - 1.030 Final   pH 08/29/2022 5.5  5.0 - 8.0 Final   Glucose, UA 08/29/2022 NEGATIVE  NEGATIVE mg/dL Final   Hgb urine dipstick 08/29/2022 SMALL (A)  NEGATIVE Final   Bilirubin Urine 08/29/2022 NEGATIVE  NEGATIVE Final   Ketones, ur 08/29/2022 NEGATIVE  NEGATIVE  mg/dL Final   Protein, ur 08/29/2022 NEGATIVE  NEGATIVE mg/dL Final   Nitrite 08/29/2022 NEGATIVE  NEGATIVE Final   Leukocytes,Ua 08/29/2022 NEGATIVE  NEGATIVE Final   RBC / HPF 08/29/2022 0-5  0 - 5 RBC/hpf Final   WBC, UA 08/29/2022 0-5  0 - 5 WBC/hpf Final   Bacteria, UA 08/29/2022 RARE (A)  NONE SEEN Final   Squamous Epithelial / HPF 08/29/2022 0-5  0 - 5 Final   Mucus 08/29/2022 PRESENT   Final   Performed at [facility], [address], [place], [place] [address]   Preg Test, Ur 08/29/2022 NEGATIVE  NEGATIVE Final   Comment:        THE SENSITIVITY OF THIS METHODOLOGY IS >20 mIU/mL. Performed at [facility], [address], [place], [place] [address]   Admission on 08/15/2022, Discharged on 08/16/2022  Component Date Value Ref Range  Status   Lipase 08/15/2022 21  11 - 51 U/L Final   Performed at [facility], [place], [place] [address]   Sodium 08/15/2022 140  135 - 145 mmol/L Final   Potassium 08/15/2022 3.5  3.5 - 5.1 mmol/L Final   Chloride 08/15/2022 104  98 - 111 mmol/L Final   CO2 08/15/2022 26  22 - 32 mmol/L Final   Glucose, Bld 08/15/2022 94  70 - 99 mg/dL Final   Glucose reference range applies only to samples taken after fasting for at least 8 hours.   BUN 08/15/2022 14  6 - 20 mg/dL Final   Creatinine, Ser 08/15/2022 0.98  0.44 - 1.00 mg/dL Final   Calcium 08/15/2022 9.1  8.9 - 10.3 mg/dL Final   Total Protein 08/15/2022 7.2  6.5 - 8.1 g/dL Final   Albumin 08/15/2022 4.4  3.5 - 5.0 g/dL Final   AST 08/15/2022 18  15 - 41 U/L Final   ALT 08/15/2022 26  0 - 44 U/L Final   Alkaline Phosphatase 08/15/2022 66  38 - 126 U/L Final   Total Bilirubin 08/15/2022 0.5  0.3 - 1.2 mg/dL Final   GFR, Estimated 08/15/2022 >60  >60 mL/min Final   Comment: (NOTE) Calculated using the CKD-EPI Creatinine Equation (2021)    Anion gap 08/15/2022 10  5 - 15 Final   Performed at [facility], [address], [place], [place] [address]   WBC 08/15/2022 12.2 (H)  4.0 - 10.5 K/uL Final   RBC 08/15/2022 4.89  3.87 - 5.11 MIL/uL Final   Hemoglobin 08/15/2022 15.4 (H)  12.0 - 15.0 g/dL Final   HCT 08/15/2022 45.1  36.0 - 46.0 % Final   MCV 08/15/2022 92.2  80.0 - 100.0 fL Final   MCH 08/15/2022 31.5  26.0 - 34.0 pg Final   MCHC 08/15/2022 34.1  30.0 - 36.0 g/dL Final   RDW 08/15/2022 12.4  11.5 - 15.5 % Final   Platelets 08/15/2022 274  150 - 400 K/uL Final   nRBC 08/15/2022 0.0  0.0 - 0.2 % Final   Performed at [facility], [address], [place], [place] [address]   Color, Urine 08/15/2022 YELLOW  YELLOW Final   APPearance 08/15/2022 CLEAR  CLEAR Final   Specific Gravity, Urine 08/15/2022 1.031 (H)  1.005 - 1.030 Final   pH 08/15/2022 6.0  5.0 -  8.0 Final   Glucose, UA 08/15/2022 NEGATIVE  NEGATIVE mg/dL Final   Hgb urine dipstick 08/15/2022 LARGE (A)  NEGATIVE Final   Bilirubin Urine 08/15/2022 NEGATIVE  NEGATIVE Final  Ketones, ur 08/15/2022 NEGATIVE  NEGATIVE mg/dL Final   Protein, ur 08/15/2022 TRACE (A)  NEGATIVE mg/dL Final   Nitrite 08/15/2022 NEGATIVE  NEGATIVE Final   Leukocytes,Ua 08/15/2022 TRACE (A)  NEGATIVE Final   RBC / HPF 08/15/2022 6-10  0 - 5 RBC/hpf Final   WBC, UA 08/15/2022 0-5  0 - 5 WBC/hpf Final   Squamous Epithelial / HPF 08/15/2022 0-5  0 - 5 Final   Mucus 08/15/2022 PRESENT   Final   Performed at [facility], [address] [place], [place] [address]   Preg Test, Ur 08/15/2022 NEGATIVE  NEGATIVE Final   Comment:        THE SENSITIVITY OF THIS METHODOLOGY IS >20 mIU/mL. Performed at [facility], [address] [place], [place] [address]   Appointment on 08/15/2022  Component Date Value Ref Range Status   CA 125 08/15/2022 6  <35 U/mL Final   Comment: . This test was performed using the Siemens  Chemiluminescent method. Values obtained from different assay methods cannot be used  interchangeably. CA 125 levels, regardless of  value, should not be interpreted as absolute evidence of the presence or absence of disease. .   Office Visit on 06/28/2022  Component Date Value Ref Range Status   Color, Urine 06/28/2022 YELLOW  YELLOW Final   APPearance 06/28/2022 CLOUDY (A)  CLEAR Final   Specific Gravity, Urine 06/28/2022 1.020  1.001 - 1.035 Final   pH 06/28/2022 7.0  5.0 - 8.0 Final   Glucose, UA 06/28/2022 NEGATIVE  NEGATIVE Final   Bilirubin Urine 06/28/2022 NEGATIVE  NEGATIVE Final   Ketones, ur 06/28/2022 NEGATIVE  NEGATIVE Final   Hgb urine dipstick 06/28/2022 3+ (A)  NEGATIVE Final   Protein, ur 06/28/2022 NEGATIVE  NEGATIVE Final   Nitrites, Initial 06/28/2022 NEGATIVE  NEGATIVE Final   Leukocyte Esterase 06/28/2022 NEGATIVE  NEGATIVE Final    WBC, UA 06/28/2022 NONE SEEN  0 - 5 /HPF Final   RBC / HPF 06/28/2022 40-60 (A)  0 - 2 /HPF Final   Squamous Epithelial / HPF 06/28/2022 0-5  < OR = 5 /HPF Final   Bacteria, UA 06/28/2022 FEW (A)  NONE SEEN /HPF Final   Hyaline Cast 06/28/2022 NONE SEEN  NONE SEEN /LPF Final   Urine-Other 06/28/2022    Final   URINE CULTURE PENDING   Note 06/28/2022    Final   Comment: This urine was analyzed for the presence of WBC,  RBC, bacteria, casts, and other formed elements.  Only those elements seen were reported. . .    Adequacy 06/28/2022 Satisfactory for evaluation; transformation zone component PRESENT.   Final   Adequacy 06/28/2022 The specimen is limited by sparse cellularity.   Final   Diagnosis 06/28/2022 - Negative for Intraepithelial Lesions or Malignancy (NILM)   Final   Diagnosis 06/28/2022 - Benign reactive/reparative changes   Final   MICRO NUMBER: 06/28/2022 [id number]   Final   SPECIMEN QUALITY: 06/28/2022 Adequate   Final   Sample Source 06/28/2022 URINE   Final   STATUS: 06/28/2022 FINAL   Final   Result: 06/28/2022 Less than 10,000 CFU/mL of single Gram positive organism isolated. No further testing will be performed. If clinically indicated, recollection using a method to minimize contamination, with prompt transfer to Urine Culture Transport Tube, is recommended.   Final   REFLEXIVE URINE CULTURE 06/28/2022    Final   CULTURE INDICATED - RESULTS TO FOLLOW  Office Visit on 06/20/2022  Component Date Value  Ref Range Status   SARS Coronavirus 2 Ag 06/20/2022 Negative  Negative Final  Admission on 06/13/2022, Discharged on 06/13/2022  Component Date Value Ref Range Status   Color, Urine 06/13/2022 YELLOW  YELLOW Final   APPearance 06/13/2022 CLEAR  CLEAR Final   Specific Gravity, Urine 06/13/2022 1.022  1.005 - 1.030 Final   pH 06/13/2022 5.5  5.0 - 8.0 Final   Glucose, UA 06/13/2022 NEGATIVE  NEGATIVE mg/dL Final   Hgb urine dipstick 06/13/2022 MODERATE (A)  NEGATIVE Final    Bilirubin Urine 06/13/2022 NEGATIVE  NEGATIVE Final   Ketones, ur 06/13/2022 NEGATIVE  NEGATIVE mg/dL Final   Protein, ur 21/30/8657 NEGATIVE  NEGATIVE mg/dL Final   Nitrite 84/69/6295 NEGATIVE  NEGATIVE Final   Leukocytes,Ua 06/13/2022 MODERATE (A)  NEGATIVE Final   RBC / HPF 06/13/2022 6-10  0 - 5 RBC/hpf Final   WBC, UA 06/13/2022 11-20  0 - 5 WBC/hpf Final   Bacteria, UA 06/13/2022 FEW (A)  NONE SEEN Final   Squamous Epithelial / HPF 06/13/2022 0-5  0 - 5 Final   Mucus 06/13/2022 PRESENT   Final   Performed at [facility], [address] [place], [place] [address]   Preg Test, Ur 06/13/2022 NEGATIVE  NEGATIVE Final   Comment:        THE SENSITIVITY OF THIS METHODOLOGY IS >20 mIU/mL. Performed at [facility], [address], [place], [place] [address]    WBC 06/13/2022 13.5 (H)  4.0 - 10.5 K/uL Final   RBC 06/13/2022 5.08  3.87 - 5.11 MIL/uL Final   Hemoglobin 06/13/2022 15.8 (H)  12.0 - 15.0 g/dL Final   HCT 02/72/5366 46.0  36.0 - 46.0 % Final   MCV 06/13/2022 90.6  80.0 - 100.0 fL Final   MCH 06/13/2022 31.1  26.0 - 34.0 pg Final   MCHC 06/13/2022 34.3  30.0 - 36.0 g/dL Final   RDW 44/12/4740 12.3  11.5 - 15.5 % Final   Platelets 06/13/2022 312  150 - 400 K/uL Final   nRBC 06/13/2022 0.0  0.0 - 0.2 % Final   Performed at [facility], [address], [place], [place] [address]   Sodium 06/13/2022 141  135 - 145 mmol/L Final   Potassium 06/13/2022 3.4 (L)  3.5 - 5.1 mmol/L Final   Chloride 06/13/2022 102  98 - 111 mmol/L Final   CO2 06/13/2022 26  22 - 32 mmol/L Final   Glucose, Bld 06/13/2022 111 (H)  70 - 99 mg/dL Final   Glucose reference range applies only to samples taken after fasting for at least 8 hours.   BUN 06/13/2022 14  6 - 20 mg/dL Final   Creatinine, Ser 06/13/2022 1.07 (H)  0.44 - 1.00 mg/dL Final   Calcium 87/56/4332 9.7  8.9 - 10.3 mg/dL Final   GFR, Estimated 06/13/2022 >60  >60 mL/min Final    Comment: (NOTE) Calculated using the CKD-EPI Creatinine Equation (2021)    Anion gap 06/13/2022 13  5 - 15 Final   Performed at [facility], [address], [place], [place] [address]    Allergies: Adhesive [tape], Cherry, Sulfamethoxazole-trimethoprim, Levofloxacin, Morphine, Gabapentin, Metformin, and Sumatriptan  Medications:  PTA Medications  Medication Sig   ARIPiprazole (ABILIFY) 20 MG tablet Take 1 tablet (20 mg total) by mouth daily.   acetaminophen (TYLENOL) 500 MG tablet Take 1,000 mg by mouth 2 (two) times daily as needed (pain).   ibuprofen (ADVIL) 200 MG tablet Take 400 mg by mouth  2 (two) times daily as needed (pain).   escitalopram (LEXAPRO) 10 MG tablet TAKE 1 TABLET(10 MG) BY MOUTH DAILY   hydrOXYzine (ATARAX) 10 MG tablet Take 1 tablet (10 mg total) by mouth 3 (three) times daily as needed.   albuterol (VENTOLIN HFA) 108 (90 Base) MCG/ACT inhaler Inhale 2 puffs into the lungs as needed for wheezing or shortness of breath.   ARIPiprazole (ABILIFY) 5 MG tablet Take 1 tablet (5 mg total) by mouth daily. Take 1 pill for 3 days, then 2 pills for 3 days, then 3 pills for 3 days then resume your 20mg  pills.    Medical Decision Making  Rebekah Davis was admitted to [facility] continuous assessment unit  for MDD (major depressive disorder), recurrent severe, without psychosis ([place]), crisis management, and stabilization. Routine labs ordered, which include  Lab Orders         Resp panel by RT-PCR (RSV, Flu A&B, Covid) Anterior Nasal Swab         CBC with Differential/Platelet         Comprehensive metabolic panel         Hemoglobin A1c         Magnesium         Ethanol         Lipid panel         TSH         Prolactin         Urinalysis, Routine w reflex microscopic -Urine, Catheterized         POC urine preg, ED    Medication Management: Medications started Meds ordered this encounter  Medications   acetaminophen  (TYLENOL) tablet 650 mg   alum & mag hydroxide-simeth (MAALOX/MYLANTA) 200-200-20 MG/5ML suspension 30 mL   magnesium hydroxide (MILK OF MAGNESIA) suspension 30 mL   escitalopram (LEXAPRO) tablet 10 mg   ARIPiprazole (ABILIFY) tablet 10 mg   hydrOXYzine (ATARAX) tablet 25 mg   Will maintain continuous observation for safety. Social work will consult with family for collateral information and discuss discharge and follow up plan.    Recommendations  Based on my evaluation the patient does not appear to have an emergency medical condition.  Rebekah Dolney, NP 11/20/22  2:38 PM

## 2022-11-20 NOTE — ED Notes (Signed)
Pt sleeping@this time. Breathing even and unlabored. Will continue to monitor for safety 
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
## 2022-11-21 DIAGNOSIS — F4310 Post-traumatic stress disorder, unspecified: Secondary | ICD-10-CM | POA: Diagnosis not present

## 2022-11-21 DIAGNOSIS — F603 Borderline personality disorder: Secondary | ICD-10-CM | POA: Diagnosis not present

## 2022-11-21 DIAGNOSIS — F332 Major depressive disorder, recurrent severe without psychotic features: Secondary | ICD-10-CM | POA: Diagnosis not present

## 2022-11-21 DIAGNOSIS — F419 Anxiety disorder, unspecified: Secondary | ICD-10-CM | POA: Diagnosis not present

## 2022-11-21 MED ORDER — NITROFURANTOIN MONOHYD MACRO 100 MG PO CAPS: 100.0000 mg | ORAL_CAPSULE | Freq: Two times a day (BID) | ORAL | 0 refills | Status: DC

## 2022-11-21 MED ORDER — HYDROXYZINE HCL 25 MG PO TABS: 25.0000 mg | ORAL_TABLET | Freq: Three times a day (TID) | ORAL | 0 refills | Status: DC | PRN

## 2022-11-21 MED ORDER — NITROFURANTOIN MONOHYD MACRO 100 MG PO CAPS
100.0000 mg | ORAL_CAPSULE | Freq: Two times a day (BID) | ORAL | Status: DC
  Administered 2022-11-21: 100 mg via ORAL
  Filled 2022-11-21: qty 1

## 2022-11-21 NOTE — ED Notes (Signed)
Pt sleeping at present, no distress noted.  Monitoring for safety. 

## 2022-11-21 NOTE — Discharge Instructions (Addendum)
An appointment has been scheduled for you You are scheduled for an assessment for the PHP on 11/22/22 @ 12p. This appointment will last approximately one hour and will be virtual via Webex. PHP is virtual group therapy that runs Mon-Fri from 9am-1pm. Please download the [employer] app prior to the appointment. If you need to cancel or reschedule, please call [phone].    Patient is instructed prior to discharge to:  Take all medications as prescribed by his/her mental healthcare provider. Report any adverse effects and or reactions from the medicines to his/her outpatient provider promptly. Keep all scheduled appointments, to ensure that you are getting refills on time and to avoid any interruption in your medication.  If you are unable to keep an appointment call to reschedule.  Be sure to follow-up with resources and follow-up appointments provided.  Patient has been instructed & cautioned: To not engage in alcohol and or illegal drug use while on prescription medicines. In the event of worsening symptoms, patient is instructed to call the crisis hotline, 911 and or go to the nearest ED for appropriate evaluation and treatment of symptoms. To follow-up with his/her primary care provider for your other medical issues, concerns and or health care needs.  Information: -National Suicide Prevention Lifeline 1-800-SUICIDE or 309-588-7229.  -988 offers 24/7 access to trained crisis counselors who can help people experiencing mental health-related distress. People can call or text 988 or chat 988lifeline.org for themselves or if they are worried about a loved one who may need crisis support.

## 2022-11-21 NOTE — ED Provider Notes (Signed)
FBC/OBS ASAP Discharge Summary  Date and Time: 11/21/2022 9:50 AM  Name: Rebekah Davis  MRN:  [id number]   Discharge Diagnoses:  Final diagnoses:  MDD (major depressive disorder), recurrent severe, without psychosis ([place])  PTSD (post-traumatic stress disorder)  Borderline personality disorder ([place])  Anxiety  Deliberate self-cutting    Subjective: Patient states "I feel a lot better than I did and I feel confident that I can take my medication."  She goes on "I was able to sleep a lot better last night and I am ready to go home."  Chart reviewed and patient discussed with attending psychiatrist, Dr. Hampton Abbot on 11/21/2022.  Patient is reassessed, face-to-face, by nurse practitioner.  She is seated in observation area upon my approach, no apparent distress.  She is alert and oriented, pleasant and cooperative during assessment.  Patient presents with euthymic mood, congruent affect.  She reports readiness to discharge home.  Conny denies suicidal and homicidal ideation.  She easily contracts verbally for safety with this Probation officer.  She denies auditory and visual hallucinations.  There is no evidence of delusional thought content and no indication that patient is responding to internal stimuli.  She denies symptoms of paranoia.  Patient reports she would like to consider alternative outpatient psychiatry follow-up providers, particularly for therapy.  She is linked with a therapist, with whom she meets virtually, once every 2 to 3 months.  She would like to be seen more frequently.  She reports she is tolerating hydroxyzine 25 mg well, denies side effects or complaints.  Taffie identifies her mentor who resides in [place] that she met through her church as her primary emotional support.  She declines allowing this Probation officer to Environmental consultant at this time because "she is at work[employer] denies UTI symptoms including painful urination, frequent urination, urine with odor,  pelvic pain.  She states "I have an ovarian cyst so I feel pelvic pain at times."  Reviewed UA results including positive leukocytes, cloudy urine.  Discussed mildly elevated WBC count 11.9.  Patient has been diagnosed with UTI in the past.  She would like to begin nitrofurantoin 100 mg twice daily x 5 days.  She has tolerated this medication in the past.  Patient resides in [facility] [place] [facility]  She denies access to weapons.  She is employed as a Health visitor.  She denies alcohol or substance use.   Patient educated and verbalizes understanding of mental health resources and other crisis services in the community. They are instructed to call 911 and present to the nearest emergency room should patient experience any suicidal/homicidal ideation, auditory/visual/hallucinations, or detrimental worsening of mental health condition.   .  Stay Summary: HPI 11/20/2022- 1438pm: Rebekah Davis 37 y.o., female patient presented to [facility] as walk in with complaints of worsening anxiety, depression, and needing help with medication management.     Patient seen face to face by this provider, consulted with Dr. Hampton Abbot; and chart reviewed on 11/20/22.  On evaluation [name] reports she has chronic history with anxiety and depression with staying over the last two days things are really getting bad. Patient reports in December she stopped her medications for 6 days and by January her depression was worsening.  States that she has been restarted on her medications, but a new medication was recently added "And I just feel really scared to start a new medicine when there is no one around to watch me.  Patient states that she has  a roommate, but they don't see each other often related to working opposite shifts "It's almost like living alone."  Patient denies suicidal/homicidal ideation, psychosis, and paranoia.  She states that she does have a history of self-harm and it had been 6 yrs. since she had cut but  states she made a very superficial cut to her leg last night.  Patient states she has outpatient psychiatric services with [facility] and her next scheduled appointment is in 2 weeks.    States her primary support is "Rebekah Davis" and I called to let him know I was coming here.   Patient states that she feels she just needs someone to watch her start the Vistaril "I know that it is to help me with my anxiety and probably won't hurt me, but I just have this fear of starting medications when I'm alone."       During evaluation Courteny Egler is sitting in chair with no noted distress.  She is alert/oriented x 4, calm, cooperative, and attentive.  Her responses were appropriated to assessment questions.  Her mood is anxious and depressed with congruent affect.  She spoke in a clear tone at moderate volume, and normal pace, with good eye contact.   She denies suicidal/self-harm/homicidal ideation, psychosis, and paranoia.  Objectively:  there is no evidence of psychosis/mania or delusional thinking.  She conversed coherently, with goal directed thoughts, and no distractibility, or pre-occupation and she has denied suicidal/self-harm/homicidal ideation, psychosis, and paranoia.    Total Time spent with patient: 30 minutes  Past Psychiatric History: see H&P Past Medical History: see H & P Family History: none reported Family Psychiatric History: none reported Social History: limited natural support in [place] area Tobacco Cessation:  N/A, patient does not currently use tobacco products  Current Medications:  Current Facility-Administered Medications  Medication Dose Route Frequency Provider Last Rate Last Admin   acetaminophen (TYLENOL) tablet 650 mg  650 mg Oral Q6H PRN Rankin, Shuvon B, NP       alum & mag hydroxide-simeth (MAALOX/MYLANTA) 200-200-20 MG/5ML suspension 30 mL  30 mL Oral Q4H PRN Rankin, Shuvon B, NP   30 mL at 11/20/22 2308   ARIPiprazole (ABILIFY) tablet 10 mg  10 mg Oral Daily Rankin,  Shuvon B, NP   10 mg at 11/21/22 0931   escitalopram (LEXAPRO) tablet 10 mg  10 mg Oral Daily Rankin, Shuvon B, NP   10 mg at 11/21/22 0931   hydrOXYzine (ATARAX) tablet 25 mg  25 mg Oral TID PRN Rankin, Shuvon B, NP   25 mg at 11/20/22 2203   magnesium hydroxide (MILK OF MAGNESIA) suspension 30 mL  30 mL Oral Daily PRN Rankin, Shuvon B, NP       nitrofurantoin (macrocrystal-monohydrate) (MACROBID) capsule 100 mg  100 mg Oral Q12H Lucky Rathke, FNP   100 mg at 11/21/22 0630   Current Outpatient Medications  Medication Sig Dispense Refill   albuterol (VENTOLIN HFA) 108 (90 Base) MCG/ACT inhaler Inhale 2 puffs into the lungs as needed for wheezing or shortness of breath. 6.7 g 1   ARIPiprazole (ABILIFY) 10 MG tablet Take 10 mg by mouth daily.     escitalopram (LEXAPRO) 10 MG tablet TAKE 1 TABLET(10 MG) BY MOUTH DAILY (Patient taking differently: Take 10 mg by mouth daily.) 30 tablet 2   hydrOXYzine (ATARAX) 25 MG tablet Take 1 tablet (25 mg total) by mouth 3 (three) times daily as needed for anxiety. 30 tablet 0   nitrofurantoin, macrocrystal-monohydrate, (MACROBID) 100  MG capsule Take 1 capsule (100 mg total) by mouth every 12 (twelve) hours. 10 capsule 0    PTA Medications:  Facility Ordered Medications  Medication   acetaminophen (TYLENOL) tablet 650 mg   alum & mag hydroxide-simeth (MAALOX/MYLANTA) 200-200-20 MG/5ML suspension 30 mL   magnesium hydroxide (MILK OF MAGNESIA) suspension 30 mL   escitalopram (LEXAPRO) tablet 10 mg   ARIPiprazole (ABILIFY) tablet 10 mg   hydrOXYzine (ATARAX) tablet 25 mg   nitrofurantoin (macrocrystal-monohydrate) (MACROBID) capsule 100 mg   PTA Medications  Medication Sig   escitalopram (LEXAPRO) 10 MG tablet TAKE 1 TABLET(10 MG) BY MOUTH DAILY (Patient taking differently: Take 10 mg by mouth daily.)   albuterol (VENTOLIN HFA) 108 (90 Base) MCG/ACT inhaler Inhale 2 puffs into the lungs as needed for wheezing or shortness of breath.   ARIPiprazole  (ABILIFY) 10 MG tablet Take 10 mg by mouth daily.   hydrOXYzine (ATARAX) 25 MG tablet Take 1 tablet (25 mg total) by mouth 3 (three) times daily as needed for anxiety.   nitrofurantoin, macrocrystal-monohydrate, (MACROBID) 100 MG capsule Take 1 capsule (100 mg total) by mouth every 12 (twelve) hours.       10/26/2022    3:18 PM 07/30/2022    4:46 PM 07/10/2022   11:43 AM  Depression screen PHQ 2/9  Decreased Interest 3 2 0  Down, Depressed, Hopeless 3 2 1   PHQ - 2 Score 6 4 1   Altered sleeping 3    Tired, decreased energy 3    Change in appetite 2    Feeling bad or failure about yourself  2    Trouble concentrating 3    Moving slowly or fidgety/restless 0    Suicidal thoughts 1    PHQ-9 Score 20    Difficult doing work/chores Very difficult Very difficult Somewhat difficult    Flowsheet Row ED from 11/20/2022 in [facility] ED from 08/30/2022 in [facility] Emergency Department at [facility] ED from 08/29/2022 in [facility] Emergency Department at [facility]  C-SSRS RISK CATEGORY No Risk No Risk No Risk       Musculoskeletal  Strength & Muscle Tone: within normal limits Gait & Station: normal Patient leans: N/A  Psychiatric Specialty Exam  Presentation  General Appearance:  Appropriate for Environment; Casual  Eye Contact: Good  Speech: Clear and Coherent; Normal Rate  Speech Volume: Normal  Handedness: Right   Mood and Affect  Mood: Euthymic  Affect: Appropriate; Congruent   Thought Process  Thought Processes: Coherent; Goal Directed; Linear  Descriptions of Associations:Intact  Orientation:Full (Time, Place and Person)  Thought Content:Logical; WDL  Diagnosis of Schizophrenia or Schizoaffective disorder in past: No    Hallucinations:Hallucinations: None  Ideas of Reference:None  Suicidal Thoughts:Suicidal Thoughts: No  Homicidal Thoughts:Homicidal Thoughts: No   Sensorium  Memory: Recent  Good; Immediate Good  Judgment: Fair  Insight: Good   Executive Functions  Concentration: Good  Attention Span: Good  Recall: Good  Fund of Knowledge: Good  Language: Good   Psychomotor Activity  Psychomotor Activity: Psychomotor Activity: Normal   Assets  Assets: Communication Skills; Desire for Improvement; Financial Resources/Insurance; Housing; Leisure Time; Intimacy; Physical Health; Resilience; Social Support   Sleep  Sleep: Sleep: Good   Nutritional Assessment (For OBS and FBC admissions only) Has the patient had a weight loss or gain of 10 pounds or more in the last 3 months?: No Has the patient had a decrease in food intake/or appetite?: No Does the patient have  dental problems?: No Does the patient have eating habits or behaviors that may be indicators of an eating disorder including binging or inducing vomiting?: No Has the patient recently lost weight without trying?: 0 Has the patient been eating poorly because of a decreased appetite?: 0 Malnutrition Screening Tool Score: 0    Physical Exam  Physical Exam Vitals and nursing note reviewed.  Constitutional:      Appearance: Normal appearance. She is well-developed.  HENT:     Head: Normocephalic and atraumatic.     Nose: Nose normal.  Cardiovascular:     Rate and Rhythm: Normal rate.  Pulmonary:     Effort: Pulmonary effort is normal.  Musculoskeletal:        General: Normal range of motion.     Cervical back: Normal range of motion.  Skin:    General: Skin is warm and dry.  Neurological:     Mental Status: She is alert and oriented to person, place, and time.  Psychiatric:        Attention and Perception: Attention and perception normal.        Mood and Affect: Mood and affect normal.        Speech: Speech normal.        Behavior: Behavior normal. Behavior is cooperative.        Thought Content: Thought content normal.        Cognition and Memory: Cognition and memory normal.         Judgment: Judgment normal.    Review of Systems  Constitutional: Negative.   HENT: Negative.    Eyes: Negative.   Respiratory: Negative.    Cardiovascular: Negative.   Gastrointestinal: Negative.   Genitourinary: Negative.   Musculoskeletal: Negative.   Skin: Negative.   Neurological: Negative.   Psychiatric/Behavioral: Negative.     Blood pressure 122/61, pulse 79, temperature 98.2 F (36.8 C), temperature source Oral, resp. rate 18, last menstrual period 10/14/2022, SpO2 100 %. There is no height or weight on file to calculate BMI.  Demographic Factors:  Caucasian  Loss Factors: NA  Historical Factors: NA  Risk Reduction Factors:   Employed, Living with another person, especially a relative, Positive social support, Positive therapeutic relationship, and Positive coping skills or problem solving skills  Continued Clinical Symptoms:  More than one psychiatric diagnosis Previous Psychiatric Diagnoses and Treatments  Cognitive Features That Contribute To Risk:  None    Suicide Risk:  Minimal: No identifiable suicidal ideation.  Patients presenting with no risk factors but with morbid ruminations; may be classified as minimal risk based on the severity of the depressive symptoms  Plan Of Care/Follow-up recommendations:  Follow-up with established outpatient psychiatry.  Referral has been made on your behalf to follow-up with partial hospitalization or intensive outpatient programs at [facility] behavioral health. Current medications: -Albuterol 108 mcg inhaler 2 puffs as needed/wheezing or shortness of breath -Aripiprazole 10 mg daily -Escitalopram 10 mg daily -Hydroxyzine 25 mg 3 times daily as needed/anxiety -Nitrofurantoin 100 mg every 12 hours x 5 days-symptoms of cystitis   Disposition: Discharge   Lucky Rathke, [place] 11/21/2022, 9:50 AM

## 2022-11-21 NOTE — ED Notes (Signed)
Patient resting quietly in bed with eyes closed. Respirations equal and unlabored, skin warm and dry, NAD. Routine safety checks conducted according to facility protocol. Will continue to monitor for safety.  

## 2022-11-21 NOTE — ED Notes (Signed)
Patient A&O x 4, ambulatory. Patient discharged in no acute distress. Patient denied SI/HI, A/VH upon discharge. Patient verbalized understanding of all discharge instructions explained by staff, to include follow up appointments, RX's and safety plan. Patient reported mood 10/10.  Pt belongings returned to patient from locker # 5 intact. Patient escorted to lobby via staff for transport to destination. Safety maintained.

## 2022-11-21 NOTE — ED Notes (Signed)
Patient A&Ox4. Patient denies SI/HI and AVH. Patient denies any physical complaints when asked. No acute distress noted. Support and encouragement provided. Routine safety checks conducted according to facility protocol. Encouraged patient to notify staff if thoughts of harm toward self or others arise. Patient verbalize understanding and agreement. Will continue to monitor for safety.    

## 2022-11-22 ENCOUNTER — Other Ambulatory Visit (HOSPITAL_COMMUNITY): Payer: Medicare Other

## 2022-11-22 ENCOUNTER — Other Ambulatory Visit: Payer: Medicare Other

## 2022-11-22 ENCOUNTER — Telehealth (HOSPITAL_COMMUNITY): Payer: Self-pay | Admitting: Professional

## 2022-11-22 ENCOUNTER — Other Ambulatory Visit: Payer: Managed Care, Other (non HMO) | Admitting: Obstetrics & Gynecology

## 2022-11-22 LAB — PROLACTIN: Prolactin: 6.2 ng/mL (ref 4.8–33.4)

## 2022-11-22 NOTE — Telephone Encounter (Signed)
Tillie Rung D, CMA [facility]/Two messages were left for patient to call and schedule appointment; patient did not return our call. Recall placed for April.

## 2022-12-10 ENCOUNTER — Emergency Department (HOSPITAL_BASED_OUTPATIENT_CLINIC_OR_DEPARTMENT_OTHER): Payer: Managed Care, Other (non HMO)

## 2022-12-10 ENCOUNTER — Other Ambulatory Visit: Payer: Self-pay

## 2022-12-10 ENCOUNTER — Emergency Department (HOSPITAL_BASED_OUTPATIENT_CLINIC_OR_DEPARTMENT_OTHER)
Admission: EM | Admit: 2022-12-10 | Discharge: 2022-12-10 | Disposition: A | Discharge status: Home / Self Care | Payer: Managed Care, Other (non HMO) | Attending: Emergency Medicine | Admitting: Emergency Medicine

## 2022-12-10 ENCOUNTER — Other Ambulatory Visit (HOSPITAL_BASED_OUTPATIENT_CLINIC_OR_DEPARTMENT_OTHER): Payer: Self-pay

## 2022-12-10 ENCOUNTER — Encounter (HOSPITAL_BASED_OUTPATIENT_CLINIC_OR_DEPARTMENT_OTHER): Payer: Self-pay

## 2022-12-10 DIAGNOSIS — N83202 Unspecified ovarian cyst, left side: Secondary | ICD-10-CM | POA: Diagnosis not present

## 2022-12-10 DIAGNOSIS — N3090 Cystitis, unspecified without hematuria: Secondary | ICD-10-CM | POA: Insufficient documentation

## 2022-12-10 DIAGNOSIS — R109 Unspecified abdominal pain: Secondary | ICD-10-CM | POA: Diagnosis present

## 2022-12-10 LAB — URINALYSIS, ROUTINE W REFLEX MICROSCOPIC
Bilirubin Urine: NEGATIVE
Glucose, UA: NEGATIVE mg/dL
Ketones, ur: NEGATIVE mg/dL
Nitrite: NEGATIVE
Protein, ur: NEGATIVE mg/dL
Specific Gravity, Urine: 1.015 (ref 1.005–1.030)
pH: 5.5 (ref 5.0–8.0)

## 2022-12-10 LAB — COMPREHENSIVE METABOLIC PANEL
ALT: 45 U/L — ABNORMAL HIGH (ref 0–44)
AST: 32 U/L (ref 15–41)
Albumin: 4.5 g/dL (ref 3.5–5.0)
Alkaline Phosphatase: 68 U/L (ref 38–126)
Anion gap: 9 (ref 5–15)
BUN: 9 mg/dL (ref 6–20)
CO2: 26 mmol/L (ref 22–32)
Calcium: 9.8 mg/dL (ref 8.9–10.3)
Chloride: 103 mmol/L (ref 98–111)
Creatinine, Ser: 0.94 mg/dL (ref 0.44–1.00)
GFR, Estimated: >60 mL/min (ref >60)
Glucose, Bld: 126 mg/dL — ABNORMAL HIGH (ref 70–99)
Potassium: 3.5 mmol/L (ref 3.5–5.1)
Sodium: 138 mmol/L (ref 135–145)
Total Bilirubin: 0.6 mg/dL (ref 0.3–1.2)
Total Protein: 7.2 g/dL (ref 6.5–8.1)

## 2022-12-10 LAB — CBC WITH DIFFERENTIAL/PLATELET
Abs Immature Granulocytes: 0.02 10*3/uL (ref 0.00–0.07)
Basophils Absolute: 0.1 10*3/uL (ref 0.0–0.1)
Basophils Relative: 1 %
Eosinophils Absolute: 0.1 10*3/uL (ref 0.0–0.5)
Eosinophils Relative: 1 %
HCT: 42.7 % (ref 36.0–46.0)
Hemoglobin: 14.6 g/dL (ref 12.0–15.0)
Immature Granulocytes: 0 %
Lymphocytes Relative: 32 %
Lymphs Abs: 2.7 10*3/uL (ref 0.7–4.0)
MCH: 31 pg (ref 26.0–34.0)
MCHC: 34.2 g/dL (ref 30.0–36.0)
MCV: 90.7 fL (ref 80.0–100.0)
Monocytes Absolute: 0.4 10*3/uL (ref 0.1–1.0)
Monocytes Relative: 4 %
Neutro Abs: 5.1 10*3/uL (ref 1.7–7.7)
Neutrophils Relative %: 62 %
Platelets: 259 10*3/uL (ref 150–400)
RBC: 4.71 MIL/uL (ref 3.87–5.11)
RDW: 12.3 % (ref 11.5–15.5)
WBC: 8.4 10*3/uL (ref 4.0–10.5)
nRBC: 0 % (ref 0.0–0.2)

## 2022-12-10 LAB — PREGNANCY, URINE: Preg Test, Ur: NEGATIVE

## 2022-12-10 LAB — LIPASE, BLOOD: Lipase: 81 U/L — ABNORMAL HIGH (ref 11–51)

## 2022-12-10 MED ORDER — SODIUM CHLORIDE 0.9 % IV BOLUS: 1000.0000 mL | Freq: Once | INTRAVENOUS | Status: DC

## 2022-12-10 MED ORDER — CEFADROXIL 500 MG PO CAPS
500.0000 mg | ORAL_CAPSULE | Freq: Two times a day (BID) | ORAL | 0 refills | Status: DC
  Filled 2022-12-10: qty 10, 5d supply, fill #0

## 2022-12-10 MED ORDER — HYDROXYZINE HCL 25 MG PO TABS
25.0000 mg | ORAL_TABLET | Freq: Once | ORAL | Status: AC
  Administered 2022-12-10: 25 mg via ORAL
  Filled 2022-12-10: qty 1

## 2022-12-10 NOTE — ED Notes (Addendum)
Patient politely refused IV and IV fluids. Medication vistaril administered as ordered. Pt tolerated well. PA notified.

## 2022-12-10 NOTE — ED Provider Notes (Signed)
[facility] Provider Note   CSN: [id number] Arrival date & time: 12/10/22  1242     History  Chief Complaint  Patient presents with   Abdominal Pain    Rebekah Davis is a 37 y.o. female.   Abdominal Pain   37 year old female presents emergency department complaints of abdominal pain.  Patient states that symptoms began yesterday but was at behavioral health facility and unable to get transferred to hospital.  States that she was given Toradol IM which helped symptoms.  She states that she is still experiencing minor left lower quadrant tenderness.  States it feels similar to other times she has had pain in her ovaries.  Patient has history of bilateral ovarian cysts followed by OB/GYN outpatient.  Denies fever, nausea, vomiting, urinary symptoms, vaginal symptoms, change in bowel habits, chest pain, shortness of breath.  Past medical history significant for ovarian cyst, cystitis, GERD, low back pain, PCOS, substance abuse, PTSD, OSA, borderline personality disorder, MDD, PTSD  Home Medications Prior to Admission medications   Medication Sig Start Date End Date Taking? Authorizing Provider  cefadroxil (DURICEF) 500 MG capsule Take 1 capsule (500 mg total) by mouth 2 (two) times daily. 12/10/22  Yes Dion Saucier A, PA  albuterol (VENTOLIN HFA) 108 (90 Base) MCG/ACT inhaler Inhale 2 puffs into the lungs as needed for wheezing or shortness of breath. 08/03/22   Jeanie Sewer, NP  ARIPiprazole (ABILIFY) 10 MG tablet Take 10 mg by mouth daily. 10/29/22   [provider]  escitalopram (LEXAPRO) 10 MG tablet TAKE 1 TABLET(10 MG) BY MOUTH DAILY Patient taking differently: Take 10 mg by mouth daily. 06/29/22   Jeanie Sewer, NP  hydrOXYzine (ATARAX) 25 MG tablet Take 1 tablet (25 mg total) by mouth 3 (three) times daily as needed for anxiety. 11/21/22   Lucky Rathke, FNP  nitrofurantoin, macrocrystal-monohydrate, (MACROBID) 100 MG  capsule Take 1 capsule (100 mg total) by mouth every 12 (twelve) hours. 11/21/22   Lucky Rathke, FNP      Allergies    Adhesive [tape], Cherry, Sulfamethoxazole-trimethoprim, Levofloxacin, Morphine, Gabapentin, Metformin, and Sumatriptan    Review of Systems   Review of Systems  Gastrointestinal:  Positive for abdominal pain.  All other systems reviewed and are negative.   Physical Exam Updated Vital Signs BP (!) 156/97 (BP Location: Right Wrist)   Pulse 101   Temp 98.3 F (36.8 C)   Resp 20   Ht '5\' 5"'$  (1.651 m)   Wt (!) 156 kg   SpO2 100%   BMI 57.24 kg/m  Physical Exam Vitals and nursing note reviewed.  Constitutional:      General: She is not in acute distress.    Appearance: She is well-developed.  HENT:     Head: Normocephalic and atraumatic.  Eyes:     Conjunctiva/sclera: Conjunctivae normal.  Cardiovascular:     Rate and Rhythm: Normal rate and regular rhythm.     Heart sounds: No murmur heard. Pulmonary:     Effort: Pulmonary effort is normal. No respiratory distress.     Breath sounds: Normal breath sounds.  Abdominal:     Palpations: Abdomen is soft.     Tenderness: There is no right CVA tenderness, left CVA tenderness, guarding or rebound.     Comments: Left lower quadrant/pelvic tenderness.  Musculoskeletal:        General: No swelling.     Cervical back: Neck supple.  Skin:    General: Skin is  warm and dry.     Capillary Refill: Capillary refill takes less than 2 seconds.  Neurological:     Mental Status: She is alert.  Psychiatric:        Mood and Affect: Mood normal.     ED Results / Procedures / Treatments   Labs (all labs ordered are listed, but only abnormal results are displayed) Labs Reviewed  COMPREHENSIVE METABOLIC PANEL - Abnormal; Notable for the following components:      Result Value   Glucose, Bld 126 (*)    ALT 45 (*)    All other components within normal limits  LIPASE, BLOOD - Abnormal; Notable for the following components:    Lipase 81 (*)    All other components within normal limits  URINALYSIS, ROUTINE W REFLEX MICROSCOPIC - Abnormal; Notable for the following components:   Hgb urine dipstick SMALL (*)    Leukocytes,Ua TRACE (*)    Bacteria, UA FEW (*)    All other components within normal limits  CBC WITH DIFFERENTIAL/PLATELET  PREGNANCY, URINE    EKG None  Radiology US PELVIC COMPLETE W TRANSVAGINAL AND TORSION R/O  Result Date: 12/10/2022 CLINICAL DATA:  Pelvic pain on LEFT since Friday, pressure feeling, nausea without vomiting, history of LEFT ovarian cyst. LMP 11/30/2022 EXAM: TRANSABDOMINAL AND TRANSVAGINAL ULTRASOUND OF PELVIS DOPPLER ULTRASOUND OF OVARIES TECHNIQUE: Both transabdominal and transvaginal ultrasound examinations of the pelvis were performed. Transabdominal technique was performed for global imaging of the pelvis including uterus, ovaries, adnexal regions, and pelvic cul-de-sac. It was necessary to proceed with endovaginal exam following the transabdominal exam to visualize the uterus, endometrium, RIGHT ovary, and to characterize a LEFT adnexal cyst. Color and duplex Doppler ultrasound was utilized to evaluate blood flow to the ovaries. COMPARISON:  11/08/2022 FINDINGS: Uterus Measurements: 6.8 x 3.8 x 5.2 cm = volume: 71 mL. Anteverted. Normal morphology without mass. Small nabothian cyst at cervix. Endometrium Thickness: 9 mm.  No endometrial fluid or mass Right ovary Measurements: 3.5 x 2.6 x 3.1 cm = volume: 14.4 mL. Normal morphology without mass. Internal blood flow present on color Doppler imaging. Left ovary Measurements: 6.4 x 5.5 x 7.4 cm = volume: 135 mL. Simple cyst within LEFT ovary 5.0 x 4.3 x 6.2 cm (volume = 70 cm^3 unchanged since 08/30/2022. Little surrounding ovarian tissue present, limiting duplex Doppler ultrasound assessment. Pulsed Doppler evaluation of both ovaries demonstrates normal low-resistance arterial and venous waveforms in the RIGHT ovary. On the LEFT, a venous  waveform is detected. LEFT ovarian waveform is not well visualized, though this may be technical in origin due to body habitus and limited LEFT ovarian tissue adjacent to the large cyst. Other findings No free pelvic fluid or adnexal masses. IMPRESSION: Normal appearing uterus, endometrial complex, and RIGHT ovary. 6.2 cm diameter simple cyst within LEFT ovary; Recommend follow-up US in 3-6 months. Note: This recommendation does not apply to premenarchal patients or to those with increased risk (genetic, family history, elevated tumor markers or other high-risk factors) of ovarian cancer. Reference: Radiology 2019 Nov; 293(2):359-371. Inadequate arterial waveform at the LEFT ovary, potentially technical in origin due to body habitus and limited amount of ovarian tissue surrounding the LEFT ovarian cyst; examination is unable to completely exclude ovarian torsion but no grayscale features of LEFT ovarian torsion are identified. Electronically Signed   By: Lavonia Dana M.D.   On: 12/10/2022 15:19    Procedures Procedures    Medications Ordered in ED Medications  hydrOXYzine (ATARAX) tablet 25 mg (25  mg Oral Given 12/10/22 1401)    ED Course/ Medical Decision Making/ A&P                             Medical Decision Making Amount and/or Complexity of Data Reviewed Labs: ordered. Radiology: ordered.  Risk Prescription drug management.   This patient presents to the ED for concern of left lower abdominal pain, this involves an extensive number of treatment options, and is a complaint that carries with it a high risk of complications and morbidity.  The differential diagnosis includes ovarian torsion, ectopic pregnancy, diverticulitis, nephrolithiasis, cystitis pyelonephritis, volvulus, PID, tubo-ovarian abscess   Co morbidities that complicate the patient evaluation  See HPI   Additional history obtained:  Additional history obtained from EMR External records from outside source obtained  and reviewed including hospital records   Lab Tests:  I Ordered, and personally interpreted labs.  The pertinent results include: No leukocytosis noted.  No evidence of anemia.  Platelets within range.  No electrolyte notes appreciated.  No transaminitis.  No renal dysfunction.  Lipase isolated Lee elevated at 81 with no epigastric pain, nausea, vomiting.  UA significant for infection with bacteria few, leukocyte trace; will treat with empiric antibiotics.   Imaging Studies ordered:  I ordered imaging studies including pelvic ultrasound I independently visualized and interpreted imaging which showed normal uterus, endometrial complex and right ovary.  6.2 cm left ovarian cyst.  Inadequate arterial waveform of the left ovary but no grayscale features of left ovary identified. I agree with the radiologist interpretation  Cardiac Monitoring: / EKG:  The patient was maintained on a cardiac monitor.  I personally viewed and interpreted the cardiac monitored which showed an underlying rhythm of: Sinus rhythm   Consultations Obtained:  I requested consultation with attending physician Dr. Melina Copa who was in agreement with treatment plan going forward  Problem List / ED Course / Critical interventions / Medication management  Left lower quadrant pain I ordered medication including hydroxyzine   Reevaluation of the patient after these medicines showed that the patient improved I have reviewed the patients home medicines and have made adjustments as needed   Social Determinants of Health:  Former cigarette use.  Denies illicit drug use.   Test / Admission - Considered:  Left lower quadrant pain Vitals signs significant for initially tachycardic, tachypneic and hypertensive with a heart rate of 116, respiratory rate of 24 blood pressure 154/83 of which responded to time elapsed and medicines administered while emergency department.. Otherwise within normal range and stable throughout  visit. Laboratory/imaging studies significant for: See above Patient with evidence of left-sided ovarian mass as indicated above.  No clinical indication for torsion as patient is currently without pain.  Most severe pain seems to be limited to last night prior to administration of Toradol.  Patient overall well-appearing, afebrile in no acute distress.  Recommend follow-up with OB/GYN for reevaluation of left-sided ovarian cyst.  Strict return precautions were discussed at length as ultrasound was not definitively ruled out of ovarian torsion but low clinical suspicion given patient's current status of being painless.  Patient with elevated lipase while in the ED with no epigastric tenderness, nausea, vomiting or food intolerance; patient educated regarding laboratory finding and instructed to return if becomes symptomatic.  Treatment plan discussed at length with patient and she acknowledged understanding was agreeable to said plan. Worrisome signs and symptoms were discussed with the patient, and the patient acknowledged understanding  to return to the ED if noticed. Patient was stable upon discharge.          Final Clinical Impression(s) / ED Diagnoses Final diagnoses:  Cystitis  Left ovarian cyst    Rx / DC Orders ED Discharge Orders          Ordered    cefadroxil (DURICEF) 500 MG capsule  2 times daily        12/10/22 1358              Wilnette Kales, [place] 12/10/22 [place], [place], DO 12/12/22 2157

## 2022-12-10 NOTE — Discharge Instructions (Addendum)
Note the workup today was overall reassuring.  Presence of left ovarian cyst without acute evidence of ovarian torsion today.  As discussed, recommend follow-up with OB/GYN outpatient for reassessment of your symptoms.  Take Tylenol/Motrin as needed for future pain.  If pain returns, please return to emergency department for further valuation.  Please do not hesitate to return to emergency department if the worrisome signs and symptoms we discussed become apparent.

## 2022-12-10 NOTE — ED Notes (Signed)
Discharge instructions, follow up care, and prescription reviewed and explained, pt verbalized understanding.

## 2022-12-10 NOTE — ED Triage Notes (Signed)
Patient here POV from Home.  Endorses Left Lower ABD Pain since Friday. Mostly Pressure. Some Nausea. No Emesis. No Diarrhea. No Known Fevers.  NAD Noted during Triage. A&Ox4. GCS 15. Ambulatory.

## 2022-12-13 ENCOUNTER — Emergency Department (HOSPITAL_BASED_OUTPATIENT_CLINIC_OR_DEPARTMENT_OTHER)
Admission: EM | Admit: 2022-12-13 | Discharge: 2022-12-13 | Disposition: A | Discharge status: Home / Self Care | Payer: Managed Care, Other (non HMO) | Attending: Emergency Medicine | Admitting: Emergency Medicine

## 2022-12-13 ENCOUNTER — Other Ambulatory Visit: Payer: Self-pay

## 2022-12-13 ENCOUNTER — Encounter (HOSPITAL_BASED_OUTPATIENT_CLINIC_OR_DEPARTMENT_OTHER): Payer: Self-pay | Admitting: Emergency Medicine

## 2022-12-13 DIAGNOSIS — Z87891 Personal history of nicotine dependence: Secondary | ICD-10-CM | POA: Insufficient documentation

## 2022-12-13 DIAGNOSIS — E860 Dehydration: Secondary | ICD-10-CM | POA: Diagnosis present

## 2022-12-13 LAB — URINALYSIS, ROUTINE W REFLEX MICROSCOPIC
Bilirubin Urine: NEGATIVE
Glucose, UA: NEGATIVE mg/dL
Ketones, ur: NEGATIVE mg/dL
Nitrite: NEGATIVE
Protein, ur: NEGATIVE mg/dL
Specific Gravity, Urine: 1.024 (ref 1.005–1.030)
pH: 5.5 (ref 5.0–8.0)

## 2022-12-13 MED ORDER — SODIUM CHLORIDE 0.9 % IV BOLUS
1000.0000 mL | Freq: Once | INTRAVENOUS | Status: AC
  Administered 2022-12-13: 1000 mL via INTRAVENOUS

## 2022-12-13 MED ORDER — PANTOPRAZOLE SODIUM 40 MG IV SOLR
40.0000 mg | Freq: Once | INTRAVENOUS | Status: DC
  Filled 2022-12-13: qty 10

## 2022-12-13 NOTE — ED Provider Notes (Signed)
[facility] Provider Note: Rebekah Spurling, MD, FACEP  CSN: [id number] MRN: [id number] ARRIVAL: 12/13/22 at [place]: [id number]   CHIEF COMPLAINT  Dehydration   HISTORY OF PRESENT ILLNESS  12/13/22 3:58 AM Rebekah Davis is a 37 y.o. female who was hospitalized for suicidal ideation for 6 days earlier this month, released 12/10/2022.  She did not eat or drink very much while she was hospitalized and feels dehydrated as a result.  She was also seen in the ED on 12/10/2022 for abdominal pain and diagnosed with a left ovarian cyst.  She was given Duricef for possible urinary tract infection although her urinalysis on my review was not consistent with a urinary tract infection.  She is not taking the antibiotic because she has not had dysuria.  She had some diarrhea yesterday as well as some epigastric discomfort.  She attributes her epigastric discomfort to taking ibuprofen for multiple days for her ovarian cyst pain.  She has been more thirsty than usual but has not noticed if her urine is darker than usual.   Past Medical History:  Diagnosis Date   Abdominal pain, suprapubic 06/04/2016   Abnormal Pap smear of cervix    Acute cystitis 03/10/2018   Anxiety    Arthritis    C. difficile diarrhea 06/12/2018   Cataract    Chest pain 03/28/2018   Depression    Depression with suicidal ideation 03/09/2018   Diarrhea 06/13/2018   GERD (gastroesophageal reflux disease)    History of kidney stones    Hypomagnesemia 06/13/2018   Low back pain 07/22/2012   Migraine    Non-suicidal self harm as coping mechanism ([place]) 12/25/2019   Oral thrush 06/12/2018   Ovarian cyst    PCOS (polycystic ovarian syndrome)    PTSD (post-traumatic stress disorder)    Scarlet fever    Sleep apnea    Substance abuse ([place])    Suicidal ideation 04/12/2018    Past Surgical History:  Procedure Laterality Date   LUMBAR LAMINECTOMY     TENOLYSIS Left 10/04/2020   Procedure: LEFT ACHILLES TENDON  DEBRIDEMENT AND RECONSTRUCTION, CALCANEAL EXOSTECTOMY, GASTROCNEMIUS RECESSION,  FLEXOR HALLUCIS LONGUS TRANSFER;  Surgeon: Erle Crocker, MD;  Location: [facility];  Service: Orthopedics;  Laterality: Left;  LENGTH OF SURGERY: 1.5 HOURS   TONSILLECTOMY      Family History  Problem Relation Age of Onset   Thyroid disease Mother    Hypertension Mother    Diabetes Mother    Kidney disease Mother    Depression Mother    Hypertension Father    Diabetes Father    Ovarian cancer Maternal Grandmother    Other Maternal Grandmother        spine cancer   Heart disease Paternal Grandmother    Stroke Paternal Grandfather     Social History   Tobacco Use   Smoking status: Former    Packs/day: 1.00    Years: 10.00    Total pack years: 10.00    Types: Cigarettes    Quit date: 03/2019    Years since quitting: 3.7   Smokeless tobacco: Never  Vaping Use   Vaping Use: Never used  Substance Use Topics   Alcohol use: Not Currently   Drug use: Not Currently    Prior to Admission medications   Medication Sig Start Date End Date Taking? Authorizing Provider  albuterol (VENTOLIN HFA) 108 (90 Base) MCG/ACT inhaler Inhale 2 puffs into the lungs as needed for wheezing or shortness of breath. 08/03/22  Jeanie Sewer, NP  ARIPiprazole (ABILIFY) 10 MG tablet Take 10 mg by mouth daily. 10/29/22   [provider]  cefadroxil (DURICEF) 500 MG capsule Take 1 capsule (500 mg total) by mouth 2 (two) times daily. 12/10/22   Dion Saucier A, PA  escitalopram (LEXAPRO) 10 MG tablet TAKE 1 TABLET(10 MG) BY MOUTH DAILY Patient taking differently: Take 10 mg by mouth daily. 06/29/22   Jeanie Sewer, NP  hydrOXYzine (ATARAX) 25 MG tablet Take 1 tablet (25 mg total) by mouth 3 (three) times daily as needed for anxiety. 11/21/22   Lucky Rathke, FNP  nitrofurantoin, macrocrystal-monohydrate, (MACROBID) 100 MG capsule Take 1 capsule (100 mg total) by mouth every 12 (twelve) hours. 11/21/22   Lucky Rathke, FNP    Allergies Adhesive [tape], Cherry, Sulfamethoxazole-trimethoprim, Levofloxacin, Morphine, Gabapentin, Metformin, and Sumatriptan   REVIEW OF SYSTEMS  Negative except as noted here or in the History of Present Illness.   PHYSICAL EXAMINATION  Initial Vital Signs Blood pressure (!) 173/97, pulse (!) 108, temperature 97.8 F (36.6 C), temperature source Oral, resp. rate 20, weight (!) 156 kg, last menstrual period 11/30/2022, SpO2 100 %.  Examination General: Well-developed, high BMI female in no acute distress; appearance consistent with age of record HENT: normocephalic; atraumatic Eyes: Normal appearance Neck: supple Heart: regular rate and rhythm Lungs: clear to auscultation bilaterally Abdomen: soft; nondistended; nontender; bowel sounds present Extremities: No deformity; full range of motion; pulses normal Neurologic: Awake, alert and oriented; motor function intact in all extremities and symmetric; no facial droop Skin: Warm and dry Psychiatric: Normal mood and affect   RESULTS  Summary of this visit's results, reviewed and interpreted by myself:   EKG Interpretation  Date/Time:    Ventricular Rate:    PR Interval:    QRS Duration:   QT Interval:    QTC Calculation:   R Axis:     Text Interpretation:         Laboratory Studies: Results for orders placed or performed during the hospital encounter of 12/13/22 (from the past 24 hour(s))  Urinalysis, Routine w reflex microscopic -Urine, Clean Catch     Status: Abnormal   Collection Time: 12/13/22  4:15 AM  Result Value Ref Range   Color, Urine YELLOW YELLOW   APPearance CLEAR CLEAR   Specific Gravity, Urine 1.024 1.005 - 1.030   pH 5.5 5.0 - 8.0   Glucose, UA NEGATIVE NEGATIVE mg/dL   Hgb urine dipstick SMALL (A) NEGATIVE   Bilirubin Urine NEGATIVE NEGATIVE   Ketones, ur NEGATIVE NEGATIVE mg/dL   Protein, ur NEGATIVE NEGATIVE mg/dL   Nitrite NEGATIVE NEGATIVE   Leukocytes,Ua SMALL (A)  NEGATIVE   RBC / HPF 0-5 0 - 5 RBC/hpf   WBC, UA 6-10 0 - 5 WBC/hpf   Bacteria, UA RARE (A) NONE SEEN   Squamous Epithelial / HPF 6-10 0 - 5 /HPF   Mucus PRESENT    Imaging Studies: No results found.  ED COURSE and MDM  Nursing notes, initial and subsequent vitals signs, including pulse oximetry, reviewed and interpreted by myself.  Vitals:   12/13/22 0342  BP: (!) 173/97  Pulse: (!) 108  Resp: 20  Temp: 97.8 F (36.6 C)  TempSrc: Oral  SpO2: 100%  Weight: (!) 156 kg   Medications  pantoprazole (PROTONIX) injection 40 mg (40 mg Intravenous Patient Refused/Not Given 12/13/22 0417)  sodium chloride 0.9 % bolus 1,000 mL (1,000 mLs Intravenous New Bag/Given 12/13/22 0418)   5:06 AM Urinalysis  consistent with mild dehydration.  Patient given a liter of normal saline IV.  Urinalysis is equivocal for urinary tract infection but as patient is asymptomatic antibiotic treatment at this time is not indicated.  The patient has the antibiotic prescribed should she become symptomatic.   PROCEDURES  Procedures   ED DIAGNOSES     ICD-10-CM   1. Dehydration  E86.0          Rebekah Everly, MD 12/13/22 [phone]

## 2022-12-13 NOTE — ED Triage Notes (Signed)
Pt in with overall "dehydration", feels like she needs fluids. Pt states she was hospitalized for SI at [facility] for 6 days earlier this month, was released 2/26. She states she did not eat or drink very much while there, and feels dehydrated as a result. Also seen in ED after d/c from [facility] for some abdominal pain. Sent home with abx for possible UTI, but pt has not taken since she denies any UTI symptoms. Pt reports some diarrhea yesterday, no emesis

## 2022-12-13 NOTE — ED Notes (Signed)
Reviewed AVS with patient, patient expressed understanding of directions, denies further questions at this time.

## 2023-01-10 ENCOUNTER — Other Ambulatory Visit: Payer: Self-pay | Admitting: Orthopaedic Surgery

## 2023-01-10 DIAGNOSIS — M25572 Pain in left ankle and joints of left foot: Secondary | ICD-10-CM

## 2023-01-21 ENCOUNTER — Ambulatory Visit
Admission: RE | Admit: 2023-01-21 | Discharge: 2023-01-21 | Disposition: A | Discharge status: Home / Self Care | Payer: Medicare Other | Source: Ambulatory Visit | Attending: Orthopaedic Surgery | Admitting: Orthopaedic Surgery

## 2023-01-21 DIAGNOSIS — M25572 Pain in left ankle and joints of left foot: Secondary | ICD-10-CM

## 2023-02-06 ENCOUNTER — Ambulatory Visit (INDEPENDENT_AMBULATORY_CARE_PROVIDER_SITE_OTHER): Payer: Medicare Other | Admitting: Obstetrics & Gynecology

## 2023-02-06 ENCOUNTER — Encounter: Payer: Self-pay | Admitting: Family

## 2023-02-06 ENCOUNTER — Ambulatory Visit (INDEPENDENT_AMBULATORY_CARE_PROVIDER_SITE_OTHER): Payer: Medicare Other

## 2023-02-06 ENCOUNTER — Other Ambulatory Visit: Payer: Self-pay | Admitting: Obstetrics & Gynecology

## 2023-02-06 ENCOUNTER — Encounter: Payer: Self-pay | Admitting: Obstetrics & Gynecology

## 2023-02-06 ENCOUNTER — Ambulatory Visit (INDEPENDENT_AMBULATORY_CARE_PROVIDER_SITE_OTHER): Payer: Managed Care, Other (non HMO) | Admitting: Family

## 2023-02-06 VITALS — BP 116/80 | HR 111

## 2023-02-06 VITALS — BP 141/79 | HR 121 | Temp 97.0°F | Ht 65.0 in | Wt 349.0 lb

## 2023-02-06 DIAGNOSIS — S39011A Strain of muscle, fascia and tendon of abdomen, initial encounter: Secondary | ICD-10-CM

## 2023-02-06 DIAGNOSIS — N83202 Unspecified ovarian cyst, left side: Secondary | ICD-10-CM

## 2023-02-06 NOTE — Progress Notes (Signed)
Patient ID: Rebekah Davis, female    DOB: [birth year], 37 y.o.   MRN: [id number]  Chief Complaint  Patient presents with   Sinus Problem    Pt c/o Cough with pain right abdomen for 3 weeks, headaches, facial pressure, chills and ear pain, present since Monday. Sore throat started Friday. Seen at [facility] yesterday given Amox and prednisone.     HPI:      URI sx/abdomen pain:  Pt c/o Cough with pain right abdomen for 3 weeks, headaches, facial pressure, chills and ear pain, present since Monday. Sore throat started Friday. Seen at [facility] yesterday & given Amox and prednisone. Reports a muscle strain on upper abdomen, right side, that she thinks she pulled when reaching down & hurts only when she coughs and sneezes or moves her body in certain ways. Denies indigestion or nausea.      Assessment & Plan:  1. Strain of abdominal muscle, initial encounter - advised pt to hold a small firm pillow against ride side of upper abdomen when coughing or sneezing. Apply heat and/or analgesic patches or cream for up to tid. Can take Ibuprofen or Tylenol tid prn.   Subjective:    Outpatient Medications Prior to Visit  Medication Sig Dispense Refill   albuterol (VENTOLIN HFA) 108 (90 Base) MCG/ACT inhaler Inhale 2 puffs into the lungs as needed for wheezing or shortness of breath. 6.7 g 1   amoxicillin (AMOXIL) 500 MG capsule Take 500 mg by mouth 3 (three) times daily.     ARIPiprazole (ABILIFY) 10 MG tablet Take 10 mg by mouth daily.     escitalopram (LEXAPRO) 10 MG tablet TAKE 1 TABLET(10 MG) BY MOUTH DAILY (Patient taking differently: Take 10 mg by mouth daily.) 30 tablet 2   escitalopram (LEXAPRO) 5 MG tablet Take 5 mg by mouth daily.     hydrOXYzine (ATARAX) 10 MG tablet SMARTSIG:1 Tablet(s) By Mouth 1-3 Times Daily PRN     hydrOXYzine (ATARAX) 25 MG tablet Take 1 tablet (25 mg total) by mouth 3 (three) times daily as needed for anxiety. 30 tablet 0   predniSONE (DELTASONE) 20 MG tablet Take  20 mg by mouth daily.     No facility-administered medications prior to visit.   Past Medical History:  Diagnosis Date   Abdominal pain, suprapubic 06/04/2016   Abnormal Pap smear of cervix    Acute cystitis 03/10/2018   Anxiety    Arthritis    C. difficile diarrhea 06/12/2018   Cataract    Chest pain 03/28/2018   Depression    Depression with suicidal ideation 03/09/2018   Diarrhea 06/13/2018   GERD (gastroesophageal reflux disease)    History of kidney stones    Hypomagnesemia 06/13/2018   Low back pain 07/22/2012   Migraine    Non-suicidal self harm as coping mechanism 12/25/2019   Oral thrush 06/12/2018   Ovarian cyst    PCOS (polycystic ovarian syndrome)    PTSD (post-traumatic stress disorder)    Scarlet fever    Sleep apnea    Substance abuse    Suicidal ideation 04/12/2018   Past Surgical History:  Procedure Laterality Date   LUMBAR LAMINECTOMY     TENOLYSIS Left 10/04/2020   Procedure: LEFT ACHILLES TENDON DEBRIDEMENT AND RECONSTRUCTION, CALCANEAL EXOSTECTOMY, GASTROCNEMIUS RECESSION,  FLEXOR HALLUCIS LONGUS TRANSFER;  Surgeon: Terance Hart, MD;  Location: [facility] OR;  Service: Orthopedics;  Laterality: Left;  LENGTH OF SURGERY: 1.5 HOURS   TONSILLECTOMY     Allergies  Allergen  Reactions   Adhesive [Tape] Other (See Comments)    blister   Cherry Anaphylaxis and Rash   Sulfamethoxazole-Trimethoprim Hives   Levofloxacin Other (See Comments)    Muscle Pain   Morphine Other (See Comments)    Feels like unable to breath or swallow    Gabapentin Rash   Metformin Nausea And Vomiting and Other (See Comments)    Diaphoresis    Sumatriptan Diarrhea, Nausea And Vomiting and Other (See Comments)    Decreased heart rate and respiratory rate        Objective:    Physical Exam Vitals and nursing note reviewed.  Constitutional:      Appearance: Normal appearance. She is obese. She is not ill-appearing.     Interventions: Face mask in place.  HENT:      Right Ear: Tympanic membrane and ear canal normal.     Left Ear: Tympanic membrane and ear canal normal.     Nose:     Right Sinus: No frontal sinus tenderness.     Left Sinus: No frontal sinus tenderness.     Mouth/Throat:     Mouth: Mucous membranes are moist.     Pharynx: Posterior oropharyngeal erythema present. No pharyngeal swelling, oropharyngeal exudate or uvula swelling.     Tonsils: No tonsillar exudate or tonsillar abscesses.  Cardiovascular:     Rate and Rhythm: Normal rate and regular rhythm.  Pulmonary:     Effort: Pulmonary effort is normal.     Breath sounds: Normal breath sounds.  Musculoskeletal:        General: Normal range of motion.  Lymphadenopathy:     Head:     Right side of head: No preauricular or posterior auricular adenopathy.     Left side of head: No preauricular or posterior auricular adenopathy.     Cervical: No cervical adenopathy.  Skin:    General: Skin is warm and dry.  Neurological:     Mental Status: She is alert.  Psychiatric:        Mood and Affect: Mood normal.        Behavior: Behavior normal.    BP (!) 141/79 (BP Location: Left Arm, Patient Position: Sitting, Cuff Size: Large)   Pulse (!) 121   Temp (!) 97 F (36.1 C) (Temporal)   Ht  (1.651 m)   Wt (!) 349 lb (158.3 kg)   LMP 01/25/2023 (Exact Date) Comment: not sexually active  SpO2 99%   BMI 58.08 kg/m  Wt Readings from Last 3 Encounters:  02/06/23 (!) 349 lb (158.3 kg)  12/13/22 (!) 344 lb (156 kg)  12/10/22 (!) 344 lb (156 kg)      Dulce Sellar, NP

## 2023-02-06 NOTE — Progress Notes (Signed)
    Rebekah Davis [birth year] [id number]        37 y.o.  G0P0000   RP: Left Ovarian Cyst for Pelvic US  HPI: Left simple avascular Ovarian Cyst.  No abdominopelvic pain anymore.  Menses regular normal.  Abstinent.  Morbid obesity, weight 344 Lbs on 12/13/22.   OB History  Gravida Para Term Preterm AB Living  0 0 0 0 0 0  SAB IAB Ectopic Multiple Live Births  0 0 0 0 0    Past medical history,surgical history, problem list, medications, allergies, family history and social history were all reviewed and documented in the EPIC chart.   Directed ROS with pertinent positives and negatives documented in the history of present illness/assessment and plan.  Exam:  Vitals:   02/06/23 0957  BP: 116/80  Pulse: (!) 111  SpO2: 99%   General appearance:  Normal  Pelvic US today: Both transabdominal and transvaginal techniques were necessary to evaluate the anatomy.  No latex used.  Anteverted uterus normal in size and shape with no myometrial mass.  The uterus is measured at 7.71 x 5.2 x 4.09 cm.  The endometrial lining is measured at 11.53 mm with no mass and no vascularity seen.  The right ovary presents a 2.4 x 1.8 cm solid avascular area.  A 1.9 cm simple avascular follicle is also present.  The left ovary shows a stable 6.24 cm x 4.69 cm simple avascular cyst.  No adnexal mass seen.  No free fluid in the pelvis.   Assessment/Plan:  37 y.o. G0  1. Left ovarian cyst Simple, avascular Lt Ovarian Cyst completely stable in size at 6.24 x 4.69 cm on today's Pelvic US.  Reassured, will continue to observe, especially in the context of increased surgical risks d/t morbid obesity.  Other orders - amoxicillin (AMOXIL) 500 MG capsule; Take 500 mg by mouth 3 (three) times daily. - predniSONE (DELTASONE) 20 MG tablet; Take 20 mg by mouth daily. - escitalopram (LEXAPRO) 5 MG tablet; Take 5 mg by mouth daily. - hydrOXYzine (ATARAX) 10 MG tablet; SMARTSIG:1 Tablet(s) By Mouth 1-3 Times Daily PRN    Genia Del MD, 10:13 AM 02/06/2023

## 2023-03-20 ENCOUNTER — Encounter: Payer: Self-pay | Admitting: Obstetrics & Gynecology

## 2023-03-20 NOTE — Telephone Encounter (Signed)
Spoke with patient. Patient reports intermittent lower middle abdominal cramping that has increased with menses that started 1 week early on 03/15/23. Changing a full pad 3 x/day. Nausea yesterday afternoon and this morning, no vomiting. Pain currently 5 out of 10, has not taken anything for cramping. Denies fever/chills. Regular BM 03/20/23. Reports pressure, voids small amounts. Abstinence for contraceptive.   Patient was seen in office 02/06/23 for PUS, left ovarian cyst.   Patient will try OTC motrin 800 mg q8 hours for cramping, instructed to take with food to reduce GI side effects. OV scheduled for 03/21/23 at 1345 with Dr. Seymour Bars. ER precautions reviewed for new or worsening symptoms. Advised I will forward to Dr. Seymour Bars, our office will return call if any additional recommendations, patient agreeable.   Routing to Dr. Seymour Bars to review.

## 2023-03-21 ENCOUNTER — Ambulatory Visit (INDEPENDENT_AMBULATORY_CARE_PROVIDER_SITE_OTHER): Payer: Medicare Other | Admitting: Obstetrics & Gynecology

## 2023-03-21 ENCOUNTER — Encounter: Payer: Self-pay | Admitting: Obstetrics & Gynecology

## 2023-03-21 VITALS — BP 118/80 | HR 119 | Temp 99.3°F

## 2023-03-21 DIAGNOSIS — N760 Acute vaginitis: Secondary | ICD-10-CM

## 2023-03-21 DIAGNOSIS — R102 Pelvic and perineal pain: Secondary | ICD-10-CM

## 2023-03-21 DIAGNOSIS — N898 Other specified noninflammatory disorders of vagina: Secondary | ICD-10-CM

## 2023-03-21 LAB — WET PREP FOR TRICH, YEAST, CLUE

## 2023-03-21 MED ORDER — TINIDAZOLE 500 MG PO TABS: 1000.0000 mg | ORAL_TABLET | Freq: Two times a day (BID) | ORAL | 0 refills | Status: AC

## 2023-03-21 NOTE — Progress Notes (Signed)
    Rebekah Davis 1986-09-27 [id number]        37 y.o.  G0  RP: Pelvic pressure and pain with back pain  HPI: C/O pelvic pressure and pain with back pain.  LMP 03/15/23.  Abstinent.  No UTI Sxs otherwise.  No fever.     OB History  Gravida Para Term Preterm AB Living  0 0 0 0 0 0  SAB IAB Ectopic Multiple Live Births  0 0 0 0 0    Past medical history,surgical history, problem list, medications, allergies, family history and social history were all reviewed and documented in the EPIC chart.   Directed ROS with pertinent positives and negatives documented in the history of present illness/assessment and plan.  Exam:  Vitals:   03/21/23 1345  BP: 118/80  Pulse: (!) 119  SpO2: 99%   General appearance:  Normal  CVAT Neg bilaterally  Abdomen: Normal  Gynecologic exam: Vulva normal.  Speculum:  Cervix/Vagina normal.  Increased d/c, Wet prep and SureSwab Advanced done.  Bimanual exam:  Uterus AV, mobile, NT.  No adnexal mass felt, NT.  Limited by obesity.  Wet prep:  Clue cells present with odor  U/A: Yellow, cloudy, Pro Neg, Nit Neg, WBC 6-10, RBC 10-20, Bacteria Moderate. Clue Cells present.  U. Culture pending.   Assessment/Plan:  37 y.o. G0  1. Pelvic pressure in female Pelvic exam wnl, with BV confirmed by wet prep.  Will treat with Tinidazole.  Usage reviewed, prescription sent to pharmacy.  Precautions reviewed. - Urinalysis,Complete w/RFL Culture  2. Vaginal odor BV per wet prep.  Treating with Tinidazole.  Pending SureSwab. - WET PREP FOR TRICH, YEAST, CLUE - SureSwab Advanced Vaginitis, TMA  Other orders - tinidazole (TINDAMAX) 500 MG tablet; Take 2 tablets (1,000 mg total) by mouth 2 (two) times daily for 2 days.   Genia Del MD, 2:05 PM 03/21/2023

## 2023-03-22 ENCOUNTER — Encounter: Payer: Self-pay | Admitting: Obstetrics & Gynecology

## 2023-03-22 LAB — SURESWAB® ADVANCED VAGINITIS,TMA
CANDIDA SPECIES: NOT DETECTED
Candida glabrata: NOT DETECTED
SURESWAB(R) ADV BACTERIAL VAGINOSIS(BV),TMA: POSITIVE — AB
TRICHOMONAS VAGINALIS (TV),TMA: NOT DETECTED

## 2023-03-22 LAB — URINALYSIS, COMPLETE W/RFL CULTURE
Bilirubin Urine: NEGATIVE
Glucose, UA: NEGATIVE
Hyaline Cast: NONE SEEN /LPF
Ketones, ur: NEGATIVE
Nitrites, Initial: NEGATIVE
Protein, ur: NEGATIVE
Specific Gravity, Urine: 1.01 (ref 1.001–1.035)
pH: 5.5 (ref 5.0–8.0)

## 2023-03-22 LAB — URINE CULTURE
MICRO NUMBER:: 15049803
SPECIMEN QUALITY:: ADEQUATE

## 2023-03-22 LAB — CULTURE INDICATED

## 2023-03-25 ENCOUNTER — Telehealth: Payer: Self-pay

## 2023-03-25 ENCOUNTER — Encounter: Payer: Self-pay | Admitting: Obstetrics & Gynecology

## 2023-03-25 DIAGNOSIS — R102 Pelvic and perineal pain: Secondary | ICD-10-CM

## 2023-03-25 NOTE — Telephone Encounter (Signed)
I called patient to give her urine culture results.  Patient had sent a mychart message. She says that she finished her medication for the bacterial infection on Saturday. She states that today she has some chills, dizziness, nausea, pelvic pressure & an ache like pain. She said the medication may have given her the nausea. Patient not sure what she should do next since the urine culture was negative. Please advise. Patient is aware she may not get a returned call until tomorrow & she states that is fine. She is aware to go to ER if symptoms increase.

## 2023-03-25 NOTE — Telephone Encounter (Signed)
Patient given recommendation & to let us know if she is not getting better. Patient agreed.

## 2023-03-26 ENCOUNTER — Encounter: Payer: Self-pay | Admitting: Obstetrics & Gynecology

## 2023-03-26 NOTE — Telephone Encounter (Signed)
Per ML: "Please schedule her for a repeat outside Pelvic US (last one in 01/2023) and a visit with me after. Dr Elbert EwingsWilfrid Lund advise pt and place order.

## 2023-03-26 NOTE — Telephone Encounter (Signed)
Pt scheduled for pelvic US @ [facility] on 6/12 and then f/u OV w/ ML on 6/14. Will route to provider and close.

## 2023-03-26 NOTE — Telephone Encounter (Signed)
Per ML: "Ok, same plan as sent to you a minute ago. Dr L"  Please refer to mychart msg sent 03/25/2023. Will close this encounter.

## 2023-03-26 NOTE — Addendum Note (Signed)
Addended by: Jodelle Red D on: 03/26/2023 10:24 AM   Modules accepted: Orders

## 2023-03-27 ENCOUNTER — Ambulatory Visit (HOSPITAL_COMMUNITY)
Admission: RE | Admit: 2023-03-27 | Discharge: 2023-03-27 | Disposition: A | Discharge status: Home / Self Care | Payer: Managed Care, Other (non HMO) | Source: Ambulatory Visit | Attending: Obstetrics & Gynecology | Admitting: Obstetrics & Gynecology

## 2023-03-27 DIAGNOSIS — R102 Pelvic and perineal pain: Secondary | ICD-10-CM | POA: Insufficient documentation

## 2023-03-29 ENCOUNTER — Encounter: Payer: Self-pay | Admitting: Obstetrics & Gynecology

## 2023-03-29 ENCOUNTER — Ambulatory Visit (INDEPENDENT_AMBULATORY_CARE_PROVIDER_SITE_OTHER): Payer: Managed Care, Other (non HMO) | Admitting: Obstetrics & Gynecology

## 2023-03-29 VITALS — BP 132/84 | HR 128 | Resp 22

## 2023-03-29 DIAGNOSIS — N83201 Unspecified ovarian cyst, right side: Secondary | ICD-10-CM

## 2023-03-29 DIAGNOSIS — R102 Pelvic and perineal pain: Secondary | ICD-10-CM

## 2023-03-29 DIAGNOSIS — N83202 Unspecified ovarian cyst, left side: Secondary | ICD-10-CM

## 2023-03-29 DIAGNOSIS — Z6841 Body Mass Index (BMI) 40.0 and over, adult: Secondary | ICD-10-CM

## 2023-03-29 NOTE — Progress Notes (Signed)
Rebekah Davis Aug 13, 1986 [id number]        37 y.o.  G0  RP: Pelvic Pain/Pressure to discuss management based on Pelvic US 03/27/23   HPI: Patient seen on 03/21/23 when she was C/O pelvic pressure and pain with back pain. LMP 03/15/23. Abstinent. No UTI Sxs otherwise. No fever.  The pain has resolved since.  No current vaginal bleeding.     OB History  Gravida Para Term Preterm AB Living  0 0 0 0 0 0  SAB IAB Ectopic Multiple Live Births  0 0 0 0 0    Past medical history,surgical history, problem list, medications, allergies, family history and social history were all reviewed and documented in the EPIC chart.   Directed ROS with pertinent positives and negatives documented in the history of present illness/assessment and plan.  Exam:  Vitals:   03/29/23 1156  BP: 132/84  Pulse: (!) 128  Resp: (!) 22   General appearance:  Normal  Gynecologic exam: Deferred  Pelvic US 03/27/23 at [facility]:  FINDINGS: Uterus   Measurements: 8.0 x 3.9 x 4.6 cm = volume: 74.7 mL. Uterus is anteverted. No discrete fibroid or other myometrial abnormality.   Endometrium   Thickness: 11.3 mm.  No focal abnormality visualized.   Right ovary   Measurements: 4.5 x 3.2 x 3.8 cm = volume: 28.9 mL. 3.3 x 2.3 x 2.5 cm complex hypoechoic cystic lesion with low level internal echoes. No internal vascularity or solid component.   Left ovary   Measurements: 8.1 x 4.6 x 8.4 cm = volume: 162.3 mL. 6.1 x 4.3 x 6.6 cm simple cyst. No internal vascularity or solid component.   Other findings   No abnormal free fluid.   IMPRESSION: 1. 6.6 cm simple left ovarian cyst. Recommend follow-up US in 3-6 months given size. Note: This recommendation does not apply to premenarchal patients or to those with increased risk (genetic, family history, elevated tumor markers or other high-risk factors) of ovarian cancer. Reference: Radiology 2019 Nov; 293(2):359-371. 2. 3.3 cm complex right ovarian cyst,  nonspecific, but favored to reflect a small endometrioma. Attention at follow-up recommended. 3. Normal sonographic appearance of the uterus and endometrium.    Assessment/Plan:  37 y.o. G0P0000   1. Pelvic pressure in female Patient seen on 03/21/23 when she was C/O pelvic pressure and pain with back pain. LMP 03/15/23. Abstinent. No UTI Sxs otherwise. No fever.  The pain has resolved since.  No current vaginal bleeding. Pelvic US 03/27/23 findings thoroughly reviewed.  Stable Ovarian Cysts c/w 02/06/23 Pelvic US.  Patient prefers observation at this time, as she wants to loose weight first.  Precautions reviewed.  Repeat Pelvic US in 6 months. - US Transvaginal Non-OB; Future  2. Left ovarian cyst Stable simple Lt Ovarian Cyst.  Will reassess in 6 months by Pelvic US. - US Transvaginal Non-OB; Future  3. Right ovarian cyst Suggestive of Endometriosis per Pelvic US: 3.3 cm complex right ovarian cyst, nonspecific, but favored to reflect a small endometrioma. Offered [facility].  Surgery discussed.  Patient declined [facility] or referral for Surgery (given my departure soon) at this time, as she wants to loose weight first. - US Transvaginal Non-OB; Future  4. Class 3 severe obesity due to excess calories without serious comorbidity with body mass index (BMI) of 50.0 to 59.9 in adult [facility]) Appearance  Low carb/calorie diet discussed.  Will increase fitness activities.  Will manage with her Fam NP.  Suggestion made to consider Ozempic or  Mounjaro.  Genia Del MD, 12:03 PM 03/29/2023

## 2023-04-02 ENCOUNTER — Ambulatory Visit: Payer: Medicare Other | Admitting: Family

## 2023-04-15 ENCOUNTER — Emergency Department (HOSPITAL_BASED_OUTPATIENT_CLINIC_OR_DEPARTMENT_OTHER)
Admission: EM | Admit: 2023-04-15 | Discharge: 2023-04-15 | Disposition: A | Discharge status: Home / Self Care | Payer: Managed Care, Other (non HMO) | Attending: Emergency Medicine | Admitting: Emergency Medicine

## 2023-04-15 ENCOUNTER — Encounter (HOSPITAL_BASED_OUTPATIENT_CLINIC_OR_DEPARTMENT_OTHER): Payer: Self-pay

## 2023-04-15 ENCOUNTER — Other Ambulatory Visit: Payer: Self-pay

## 2023-04-15 ENCOUNTER — Emergency Department (HOSPITAL_BASED_OUTPATIENT_CLINIC_OR_DEPARTMENT_OTHER): Payer: Managed Care, Other (non HMO)

## 2023-04-15 DIAGNOSIS — I10 Essential (primary) hypertension: Secondary | ICD-10-CM | POA: Insufficient documentation

## 2023-04-15 DIAGNOSIS — R1024 Suprapubic pain: Secondary | ICD-10-CM

## 2023-04-15 DIAGNOSIS — R1030 Lower abdominal pain, unspecified: Secondary | ICD-10-CM | POA: Diagnosis not present

## 2023-04-15 DIAGNOSIS — R000 Tachycardia, unspecified: Secondary | ICD-10-CM | POA: Insufficient documentation

## 2023-04-15 DIAGNOSIS — R102 Pelvic and perineal pain: Secondary | ICD-10-CM

## 2023-04-15 DIAGNOSIS — Z87442 Personal history of urinary calculi: Secondary | ICD-10-CM | POA: Insufficient documentation

## 2023-04-15 LAB — COMPREHENSIVE METABOLIC PANEL
ALT: 31 U/L (ref 0–44)
AST: 24 U/L (ref 15–41)
Albumin: 4 g/dL (ref 3.5–5.0)
Alkaline Phosphatase: 66 U/L (ref 38–126)
Anion gap: 8 (ref 5–15)
BUN: 16 mg/dL (ref 6–20)
CO2: 24 mmol/L (ref 22–32)
Calcium: 8.8 mg/dL — ABNORMAL LOW (ref 8.9–10.3)
Chloride: 106 mmol/L (ref 98–111)
Creatinine, Ser: 0.84 mg/dL (ref 0.44–1.00)
GFR, Estimated: >60 mL/min (ref >60)
Glucose, Bld: 125 mg/dL — ABNORMAL HIGH (ref 70–99)
Potassium: 4.3 mmol/L (ref 3.5–5.1)
Sodium: 138 mmol/L (ref 135–145)
Total Bilirubin: 0.7 mg/dL (ref 0.3–1.2)
Total Protein: 6.8 g/dL (ref 6.5–8.1)

## 2023-04-15 LAB — URINALYSIS, ROUTINE W REFLEX MICROSCOPIC
Bilirubin Urine: NEGATIVE
Glucose, UA: NEGATIVE mg/dL
Ketones, ur: NEGATIVE mg/dL
Nitrite: NEGATIVE
RBC / HPF: >50 RBC/hpf (ref 0–5)
Specific Gravity, Urine: 1.018 (ref 1.005–1.030)
WBC, UA: >50 WBC/hpf (ref 0–5)
pH: 5 (ref 5.0–8.0)

## 2023-04-15 LAB — CBC
HCT: 42.2 % (ref 36.0–46.0)
Hemoglobin: 14.4 g/dL (ref 12.0–15.0)
MCH: 30.6 pg (ref 26.0–34.0)
MCHC: 34.1 g/dL (ref 30.0–36.0)
MCV: 89.6 fL (ref 80.0–100.0)
Platelets: 277 10*3/uL (ref 150–400)
RBC: 4.71 MIL/uL (ref 3.87–5.11)
RDW: 12.4 % (ref 11.5–15.5)
WBC: 9.7 10*3/uL (ref 4.0–10.5)
nRBC: 0 % (ref 0.0–0.2)

## 2023-04-15 LAB — PREGNANCY, URINE: Preg Test, Ur: NEGATIVE

## 2023-04-15 LAB — LIPASE, BLOOD: Lipase: 28 U/L (ref 11–51)

## 2023-04-15 NOTE — ED Triage Notes (Signed)
Patient arrives to ED POV C/O lower abd pain that started yesterday. Pt states Hx of Ovarian cysts and believe it may be related to pain. Denies N/V/D. Pt is A/O x4, Pt is anxious on in triage. No other complaints at this time.

## 2023-04-15 NOTE — ED Notes (Signed)
Patient is not wanting to get on stretcher and requested to sit in chair. Vitals obtained however pt requested to not stay on monitor stating "can you turn it off please the beeping is making me really anxious". Pt educated on the importance of keeping staying on the monitor but pt removed pulse ox and BP cuff.

## 2023-04-15 NOTE — ED Provider Notes (Addendum)
[place] [facility] EMERGENCY DEPARTMENT AT [facility] Provider Note  CSN: [id number] Arrival date & time: 04/15/23 8657  Chief Complaint(s) Abdominal Pain  HPI Rebekah Davis is a 37 y.o. female with a past medical history listed below who presents to the emergency department with 2 days of suprapubic abdominal cramping.  Pain was worse yesterday.  Improved with ibuprofen.  Started her menstrual cycle 2 days ago.  No dysuria, no urinary frequency.  No nausea or vomiting.  No diarrhea.  The history is provided by the patient.    Past Medical History Past Medical History:  Diagnosis Date   Abdominal pain, suprapubic 06/04/2016   Abnormal Pap smear of cervix    Acute cystitis 03/10/2018   Anxiety    Arthritis    C. difficile diarrhea 06/12/2018   Cataract    Chest pain 03/28/2018   Depression    Depression with suicidal ideation 03/09/2018   Diarrhea 06/13/2018   GERD (gastroesophageal reflux disease)    History of kidney stones    Hypomagnesemia 06/13/2018   Low back pain 07/22/2012   Migraine    Non-suicidal self harm as coping mechanism (HCC) 12/25/2019   Oral thrush 06/12/2018   Ovarian cyst    PCOS (polycystic ovarian syndrome)    PTSD (post-traumatic stress disorder)    Scarlet fever    Sleep apnea    Substance abuse (HCC)    Suicidal ideation 04/12/2018   Patient Active Problem List   Diagnosis Date Noted   Passive suicidal ideations 11/16/2020   Irregular bleeding 04/12/2020   Achilles tendinitis 03/11/2020   Ovarian cyst 02/19/2020   Deliberate self-cutting 01/23/2020   Transaminitis 06/13/2018   Obesity 03/28/2018   GERD (gastroesophageal reflux disease) 03/10/2018   PTSD (post-traumatic stress disorder) 03/09/2018   MDD (major depressive disorder), recurrent severe, 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 Authorizing Provider  Acetaminophen (TYLENOL PO) Take by mouth.    [provider]  albuterol (VENTOLIN HFA) 108 (90 Base) MCG/ACT inhaler Inhale 2 puffs into the lungs as needed for wheezing or shortness of breath. 08/03/22   Dulce Sellar, NP  ARIPiprazole (ABILIFY) 10 MG tablet Take 10 mg by mouth daily. 10/29/22   [provider]  Cholecalciferol (VITAMIN D3) 125 MCG (5000 UT) CAPS Take 1 capsule by mouth daily. Patient not taking: Reported on 03/21/2023 02/04/23   [provider]  escitalopram (LEXAPRO) 10 MG tablet TAKE 1 TABLET(10 MG) BY MOUTH DAILY Patient taking differently: Take 10 mg by mouth daily. 06/29/22   Dulce Sellar, NP  escitalopram (LEXAPRO) 5 MG tablet Take 5 mg by mouth daily.    [provider]  hydrOXYzine (ATARAX) 10 MG tablet SMARTSIG:1 Tablet(s) By Mouth 1-3 Times Daily PRN 02/03/23   [provider]  hydrOXYzine (ATARAX) 25 MG tablet Take 1 tablet (25 mg total) by mouth 3 (three) times daily as needed for anxiety. 11/21/22   Lenard Lance, FNP  IBUPROFEN PO Take by mouth.    [provider]  LORazepam (ATIVAN) 0.5 MG tablet Take 0.25-0.5 mg by mouth every 4 (four) hours as needed. 02/18/23   [provider]  Allergies Adhesive [tape], Cherry, Sulfamethoxazole-trimethoprim, Levofloxacin, Morphine, Gabapentin, Metformin, and Sumatriptan  Review of Systems Review of Systems As noted in HPI  Physical Exam Vital Signs  I have reviewed the triage vital signs BP (!) 184/90 (BP Location: Left Arm)   Pulse (!) 120   Temp 98.5 F (36.9 C) (Oral)   Resp 19   Ht  5\' 5"  (1.651 m)   Wt (!) 158.8 kg   LMP 03/15/2023 (Exact Date) Comment: not sexually active  SpO2 100%   BMI 58.24 kg/m   Physical Exam Vitals reviewed.  Constitutional:      General: She is not in acute distress.    Appearance: She is well-developed. She is obese. She is not diaphoretic.  HENT:     Head: Normocephalic and atraumatic.     Right Ear: External ear normal.     Left Ear: External ear normal.     Nose: Nose normal.  Eyes:     General: No scleral icterus.    Conjunctiva/sclera: Conjunctivae normal.  Neck:     Trachea: Phonation normal.  Cardiovascular:     Rate and Rhythm: Regular rhythm. Tachycardia present.  Pulmonary:     Effort: Pulmonary effort is normal. No respiratory distress.     Breath sounds: No stridor.  Abdominal:     General: There is no distension.     Tenderness: There is abdominal tenderness in the suprapubic area.  Musculoskeletal:        General: Normal range of motion.     Cervical back: Normal range of motion.  Neurological:     Mental Status: She is alert and oriented to person, place, and time.  Psychiatric:        Behavior: Behavior normal.     ED Results and Treatments Labs (all labs ordered are listed, but only abnormal results are displayed) Labs Reviewed  COMPREHENSIVE METABOLIC PANEL - Abnormal; Notable for the following components:      Result Value   Glucose, Bld 125 (*)    Calcium 8.8 (*)    All other components within normal limits  URINALYSIS, ROUTINE W REFLEX MICROSCOPIC - Abnormal; Notable for the following components:   Color, Urine ORANGE (*)    APPearance HAZY (*)    Hgb urine dipstick LARGE (*)    Protein, ur TRACE (*)    Leukocytes,Ua SMALL (*)    Bacteria, UA FEW (*)    Crystals PRESENT (*)    All other components within normal limits  LIPASE, BLOOD  CBC  PREGNANCY, URINE                                                                                                                         EKG  EKG  Interpretation Date/Time:    Ventricular Rate:    PR Interval:    QRS Duration:    QT Interval:    QTC Calculation:   R Axis:      Text Interpretation:  Radiology CT Renal Stone Study  Result Date: 04/15/2023 CLINICAL DATA:  Abdominal pain/flank pain. EXAM: CT ABDOMEN AND PELVIS WITHOUT CONTRAST TECHNIQUE: Multidetector CT imaging of the abdomen and pelvis was performed following the standard protocol without IV contrast. RADIATION DOSE REDUCTION: This exam was performed according to the departmental dose-optimization program which includes automated exposure control, adjustment of the mA and/or kV according to patient size and/or use of iterative reconstruction technique. COMPARISON:  06/13/2022 FINDINGS: Lower chest: No acute abnormality. Hepatobiliary: No focal liver abnormality is seen. No gallstones, gallbladder wall thickening, or biliary dilatation. Pancreas: Unremarkable. No pancreatic ductal dilatation or surrounding inflammatory changes. Spleen: Normal in size without focal abnormality. Adrenals/Urinary Tract: Adrenal glands are unremarkable. Kidneys are normal, without renal calculi, focal lesion, or hydronephrosis. Bladder is unremarkable. Stomach/Bowel: Stomach appears normal. The appendix is visualized and is within normal limits. No bowel wall thickening, inflammation, or distension. Vascular/Lymphatic: Normal caliber of the abdominal aorta. No signs of abdominopelvic adenopathy. Reproductive: Uterus appears normal. -Persistent left adnexal cyst measures 7.4 x 5.5 by 4.9 cm (volume = 100 cm^3), image 68/2. Recent ultrasound of the pelvis was performed which showed no internal septation or mural nodularity within this cyst. -Interval enlargement of the right ovary which measures 5.0 x 3.8 cm, image 65/2. On 06/13/2022 this measured 3.5 x 2.5 cm. On the recent ultrasound from 03/27/2023 a complex right ovarian cyst was noted favored to represent either an endometrioma or  hemorrhagic cyst. Other: No ascites. No focal fluid collections or signs of pneumoperitoneum. Musculoskeletal: No acute or significant osseous findings. First degree anterolisthesis of L5 on S1. Mild multilevel degenerative disc disease. IMPRESSION: 1. No acute findings in the abdomen or pelvis. No signs of nephrolithiasis or obstructive uropathy. 2. Persistent left adnexal cyst measures 7.4 x 5.5 x 4.9 cm (volume = 100 cubic cm). Recent ultrasound of the pelvis was performed which showed no internal septation or mural nodularity within this cyst. Follow-up by [place] is recommended in 3-6 months. Note: This recommendation does not apply to premenarchal patients and to those with increased risk (genetic, family history, elevated tumor markers or other high-risk factors) of ovarian cancer. Reference: JACR 2020 Feb; 17(2):248-254 3. Interval enlargement of the right ovary which measures 5.0 x 3.8 cm. On the recent ultrasound from 03/27/2023 a complex right ovarian cyst was noted favored to represent either an endometrioma or hemorrhagic cyst. Attention on follow-up imaging is recommended. Electronically Signed   By: Signa Kell M.D.   On: 04/15/2023 06:13    Medications Ordered in ED Medications - No data to display Procedures Procedures  (including critical care time) Medical Decision Making / ED Course   Medical Decision Making Amount and/or Complexity of Data Reviewed Labs: ordered. Decision-making details documented in ED Course. Radiology: ordered and independent interpretation performed. Decision-making details documented in ED Course.     Clinical Course as of 04/15/23 0651  Mon Apr 15, 2023  2956 Suprapubic abdominal cramping. Differential includes menstrual cramps, urinary tract infection, renal colic, pregnancy related process.  Patient with a history of PCOS presentation is less concerning for torsion. [PC]  0649 CBC without leukocytosis or anemia.  Metabolic panel without significant  electrolyte derangements or renal sufficiency.  No evidence of bili obstruction or pancreatitis.  UA with hematuria contaminated from menstrual cycle.  Crystals noted.  CT stone ordered to rule out renal stones and negative for such.  No other intra-abdominal inflammatory/infectious process.  Stable but slightly increasing ovarian cyst.  Patient monitored for several hours.  Pain resolved without intervention. [PC]    Clinical Course User Index [PC] Calah Gershman, Amadeo Garnet, MD   Tachycardia appears to be baseline when compared to prior visits.  Final Clinical Impression(s) / ED Diagnoses Final diagnoses:  Suprapubic cramping   The patient appears reasonably screened and/or stabilized for discharge and I doubt any other medical condition or other [facility] requiring further screening, evaluation, or treatment in the ED at this time. I have discussed the findings, Dx and Tx plan with the patient/family who expressed understanding and agree(s) with the plan. Discharge instructions discussed at length. The patient/family was given strict return precautions who verbalized understanding of the instructions. No further questions at time of discharge.  Disposition: Discharge  Condition: Good  ED Discharge Orders     None        Follow Up: Dulce Sellar, NP [address] [place] [address] [phone]  Call  to schedule an appointment for close follow up    This chart was dictated using voice recognition software.  Despite best efforts to proofread,  errors can occur which can change the documentation meaning.      Nira Conn, MD 04/15/23 [phone]

## 2023-04-16 ENCOUNTER — Ambulatory Visit
Admission: EM | Admit: 2023-04-16 | Discharge: 2023-04-16 | Disposition: A | Discharge status: Home / Self Care | Payer: Managed Care, Other (non HMO) | Attending: Urgent Care | Admitting: Urgent Care

## 2023-04-16 DIAGNOSIS — N760 Acute vaginitis: Secondary | ICD-10-CM | POA: Insufficient documentation

## 2023-04-16 DIAGNOSIS — R102 Pelvic and perineal pain: Secondary | ICD-10-CM | POA: Diagnosis present

## 2023-04-16 DIAGNOSIS — N83202 Unspecified ovarian cyst, left side: Secondary | ICD-10-CM | POA: Diagnosis not present

## 2023-04-16 DIAGNOSIS — N83201 Unspecified ovarian cyst, right side: Secondary | ICD-10-CM | POA: Insufficient documentation

## 2023-04-16 DIAGNOSIS — B9689 Other specified bacterial agents as the cause of diseases classified elsewhere: Secondary | ICD-10-CM | POA: Insufficient documentation

## 2023-04-16 LAB — POCT URINALYSIS DIP (MANUAL ENTRY)
Bilirubin, UA: NEGATIVE
Glucose, UA: NEGATIVE mg/dL
Ketones, POC UA: NEGATIVE mg/dL
Leukocytes, UA: NEGATIVE
Nitrite, UA: NEGATIVE
Protein Ur, POC: NEGATIVE mg/dL
Spec Grav, UA: 1.025 (ref 1.010–1.025)
Urobilinogen, UA: 0.2 E.U./dL
pH, UA: 6 (ref 5.0–8.0)

## 2023-04-16 MED ORDER — MELOXICAM 15 MG PO TABS: 15.0000 mg | ORAL_TABLET | Freq: Every day | ORAL | 1 refills | Status: DC

## 2023-04-16 MED ORDER — CELECOXIB 200 MG PO CAPS: 200.0000 mg | ORAL_CAPSULE | Freq: Two times a day (BID) | ORAL | 0 refills | Status: DC

## 2023-04-16 MED ORDER — OMEPRAZOLE 20 MG PO CPDR: 20.0000 mg | DELAYED_RELEASE_CAPSULE | Freq: Every day | ORAL | 0 refills | Status: DC

## 2023-04-16 MED ORDER — KETOROLAC TROMETHAMINE 60 MG/2ML IM SOLN
60.0000 mg | Freq: Once | INTRAMUSCULAR | Status: AC
  Administered 2023-04-16: 60 mg via INTRAMUSCULAR

## 2023-04-16 NOTE — ED Provider Notes (Signed)
[facility]  Note:  This document was prepared using Conservation officer, historic buildings and may include unintentional dictation errors.  MRN: [id number] DOB: 10-Mar-1986  Subjective:   Rebekah Davis is a 38 y.o. female presenting for ongoing severe pelvic pains.  No fever, urinary symptoms, vaginal discharge.  Has had multiple ER visits in the past month.  Has been found to have 2 ovarian cysts on either sides.  She has had extensive workup done which I have obtained from the emergency room visits and reviewed individually and with the patient, included below.  Of note, she did test positive for bacterial vaginosis 03/21/2023.  She was treated with tinidazole for 2 days.  She has an appointment with a new gynecologist tomorrow.  Has been using Tylenol and ibuprofen steadily but is not helping at all.  She has a history of significant allergic reactions and prefers to avoid any possible cross reactivity.    No current facility-administered medications for this encounter.  Current Outpatient Medications:    Acetaminophen (TYLENOL PO), Take by mouth., Disp: , Rfl:    albuterol (VENTOLIN HFA) 108 (90 Base) MCG/ACT inhaler, Inhale 2 puffs into the lungs as needed for wheezing or shortness of breath., Disp: 6.7 g, Rfl: 1   ARIPiprazole (ABILIFY) 10 MG tablet, Take 10 mg by mouth daily., Disp: , Rfl:    Cholecalciferol (VITAMIN D3) 125 MCG (5000 UT) CAPS, Take 1 capsule by mouth daily. (Patient not taking: Reported on 03/21/2023), Disp: , Rfl:    escitalopram (LEXAPRO) 10 MG tablet, TAKE 1 TABLET(10 MG) BY MOUTH DAILY (Patient taking differently: Take 10 mg by mouth daily.), Disp: 30 tablet, Rfl: 2   escitalopram (LEXAPRO) 5 MG tablet, Take 5 mg by mouth daily., Disp: , Rfl:    hydrOXYzine (ATARAX) 10 MG tablet, SMARTSIG:1 Tablet(s) By Mouth 1-3 Times Daily PRN, Disp: , Rfl:    hydrOXYzine (ATARAX) 25 MG tablet, Take 1 tablet (25 mg total) by mouth 3 (three) times daily as needed  for anxiety., Disp: 30 tablet, Rfl: 0   IBUPROFEN PO, Take by mouth., Disp: , Rfl:    LORazepam (ATIVAN) 0.5 MG tablet, Take 0.25-0.5 mg by mouth every 4 (four) hours as needed., Disp: , Rfl:    Allergies  Allergen Reactions   Adhesive [Tape] Other (See Comments)    blister   Cherry Anaphylaxis and Rash   Sulfamethoxazole-Trimethoprim Hives   Levofloxacin Other (See Comments)    Muscle Pain   Morphine Other (See Comments)    Feels like unable to breath or swallow    Gabapentin Rash   Metformin Nausea And Vomiting and Other (See Comments)    Diaphoresis    Sumatriptan Diarrhea, Nausea And Vomiting and Other (See Comments)    Decreased heart rate and respiratory rate      Past Medical History:  Diagnosis Date   Abdominal pain, suprapubic 06/04/2016   Abnormal Pap smear of cervix    Acute cystitis 03/10/2018   Anxiety    Arthritis    C. difficile diarrhea 06/12/2018   Cataract    Chest pain 03/28/2018   Depression    Depression with suicidal ideation 03/09/2018   Diarrhea 06/13/2018   GERD (gastroesophageal reflux disease)    History of kidney stones    Hypomagnesemia 06/13/2018   Low back pain 07/22/2012   Migraine    Non-suicidal self harm as coping mechanism (HCC) 12/25/2019   Oral thrush 06/12/2018   Ovarian cyst    PCOS (polycystic  ovarian syndrome)    PTSD (post-traumatic stress disorder)    Scarlet fever    Sleep apnea    Substance abuse (HCC)    Suicidal ideation 04/12/2018     Past Surgical History:  Procedure Laterality Date   LUMBAR LAMINECTOMY     TENOLYSIS Left 10/04/2020   Procedure: LEFT ACHILLES TENDON DEBRIDEMENT AND RECONSTRUCTION, CALCANEAL EXOSTECTOMY, GASTROCNEMIUS RECESSION,  FLEXOR HALLUCIS LONGUS TRANSFER;  Surgeon: Terance Hart, MD;  Location: [facility] OR;  Service: Orthopedics;  Laterality: Left;  LENGTH OF SURGERY: 1.5 HOURS   TONSILLECTOMY      Family History  Problem Relation Age of Onset   Thyroid disease Mother     Hypertension Mother    Diabetes Mother    Kidney disease Mother    Depression Mother    Hypertension Father    Diabetes Father    Ovarian cancer Maternal Grandmother    Other Maternal Grandmother        spine cancer   Heart disease Paternal Grandmother    Stroke Paternal Grandfather     Social History   Tobacco Use   Smoking status: Former    Packs/day: 1.00    Years: 10.00    Additional pack years: 0.00    Total pack years: 10.00    Types: Cigarettes    Quit date: 03/2019    Years since quitting: 4.0   Smokeless tobacco: Never  Vaping Use   Vaping Use: Never used  Substance Use Topics   Alcohol use: Not Currently   Drug use: Not Currently    ROS   Objective:   Vitals: BP (!) 157/92 (BP Location: Right Arm)   Pulse 91   Temp (!) 97.5 F (36.4 C) (Oral)   Resp 20   LMP 04/12/2023 (Exact Date) Comment: not sexually active  SpO2 99%   Physical Exam Constitutional:      General: She is not in acute distress.    Appearance: Normal appearance. She is well-developed. She is not ill-appearing, toxic-appearing or diaphoretic.  HENT:     Head: Normocephalic and atraumatic.     Nose: Nose normal.     Mouth/Throat:     Mouth: Mucous membranes are moist.     Pharynx: Oropharynx is clear.  Eyes:     General: No scleral icterus.       Right eye: No discharge.        Left eye: No discharge.     Extraocular Movements: Extraocular movements intact.     Conjunctiva/sclera: Conjunctivae normal.  Cardiovascular:     Rate and Rhythm: Normal rate.  Pulmonary:     Effort: Pulmonary effort is normal.  Abdominal:     General: Bowel sounds are normal. There is no distension.     Palpations: Abdomen is soft. There is no mass.     Tenderness: There is abdominal tenderness in the right lower quadrant, periumbilical area, suprapubic area and left lower quadrant. There is no right CVA tenderness, left CVA tenderness, guarding or rebound.  Skin:    General: Skin is warm and dry.   Neurological:     General: No focal deficit present.     Mental Status: She is alert and oriented to person, place, and time.  Psychiatric:        Mood and Affect: Mood normal.        Behavior: Behavior normal.        Thought Content: Thought content normal.        Judgment: Judgment  normal.     Results for orders placed or performed during the hospital encounter of 04/16/23 (from the past 24 hour(s))  POCT urinalysis dipstick     Status: Abnormal   Collection Time: 04/16/23  8:41 AM  Result Value Ref Range   Color, UA yellow yellow   Clarity, UA clear clear   Glucose, UA negative negative mg/dL   Bilirubin, UA negative negative   Ketones, POC UA negative negative mg/dL   Spec Grav, UA 0.981 1.914 - 1.025   Blood, UA large (A) negative   pH, UA 6.0 5.0 - 8.0   Protein Ur, POC negative negative mg/dL   Urobilinogen, UA 0.2 0.2 or 1.0 E.U./dL   Nitrite, UA Negative Negative   Leukocytes, UA Negative Negative   CT Renal Stone Study  Result Date: 04/15/2023 CLINICAL DATA:  Abdominal pain/flank pain. EXAM: CT ABDOMEN AND PELVIS WITHOUT CONTRAST TECHNIQUE: Multidetector CT imaging of the abdomen and pelvis was performed following the standard protocol without IV contrast. RADIATION DOSE REDUCTION: This exam was performed according to the departmental dose-optimization program which includes automated exposure control, adjustment of the mA and/or kV according to patient size and/or use of iterative reconstruction technique. COMPARISON:  06/13/2022 FINDINGS: Lower chest: No acute abnormality. Hepatobiliary: No focal liver abnormality is seen. No gallstones, gallbladder wall thickening, or biliary dilatation. Pancreas: Unremarkable. No pancreatic ductal dilatation or surrounding inflammatory changes. Spleen: Normal in size without focal abnormality. Adrenals/Urinary Tract: Adrenal glands are unremarkable. Kidneys are normal, without renal calculi, focal lesion, or hydronephrosis. Bladder is  unremarkable. Stomach/Bowel: Stomach appears normal. The appendix is visualized and is within normal limits. No bowel wall thickening, inflammation, or distension. Vascular/Lymphatic: Normal caliber of the abdominal aorta. No signs of abdominopelvic adenopathy. Reproductive: Uterus appears normal. -Persistent left adnexal cyst measures 7.4 x 5.5 by 4.9 cm (volume = 100 cm^3), image 68/2. Recent ultrasound of the pelvis was performed which showed no internal septation or mural nodularity within this cyst. -Interval enlargement of the right ovary which measures 5.0 x 3.8 cm, image 65/2. On 06/13/2022 this measured 3.5 x 2.5 cm. On the recent ultrasound from 03/27/2023 a complex right ovarian cyst was noted favored to represent either an endometrioma or hemorrhagic cyst. Other: No ascites. No focal fluid collections or signs of pneumoperitoneum. Musculoskeletal: No acute or significant osseous findings. First degree anterolisthesis of L5 on S1. Mild multilevel degenerative disc disease. IMPRESSION: 1. No acute findings in the abdomen or pelvis. No signs of nephrolithiasis or obstructive uropathy. 2. Persistent left adnexal cyst measures 7.4 x 5.5 x 4.9 cm (volume = 100 cubic cm). Recent ultrasound of the pelvis was performed which showed no internal septation or mural nodularity within this cyst. Follow-up by [place] is recommended in 3-6 months. Note: This recommendation does not apply to premenarchal patients and to those with increased risk (genetic, family history, elevated tumor markers or other high-risk factors) of ovarian cancer. Reference: JACR 2020 Feb; 17(2):248-254 3. Interval enlargement of the right ovary which measures 5.0 x 3.8 cm. On the recent ultrasound from 03/27/2023 a complex right ovarian cyst was noted favored to represent either an endometrioma or hemorrhagic cyst. Attention on follow-up imaging is recommended. Electronically Signed   By: Signa Kell M.D.   On: 04/15/2023 06:13   US PELVIC  COMPLETE WITH TRANSVAGINAL  Result Date: 03/27/2023 CLINICAL DATA:  Initial evaluation for pelvic pain. EXAM: TRANSABDOMINAL AND TRANSVAGINAL ULTRASOUND OF PELVIS TECHNIQUE: Both transabdominal and transvaginal ultrasound examinations of the pelvis were performed. Transabdominal  technique was performed for global imaging of the pelvis including uterus, ovaries, adnexal regions, and pelvic cul-de-sac. It was necessary to proceed with endovaginal exam following the transabdominal exam to visualize the endometrium. COMPARISON:  Prior study from 02/06/2023. FINDINGS: Uterus Measurements: 8.0 x 3.9 x 4.6 cm = volume: 74.7 mL. Uterus is anteverted. No discrete fibroid or other myometrial abnormality. Endometrium Thickness: 11.3 mm.  No focal abnormality visualized. Right ovary Measurements: 4.5 x 3.2 x 3.8 cm = volume: 28.9 mL. 3.3 x 2.3 x 2.5 cm complex hypoechoic cystic lesion with low level internal echoes. No internal vascularity or solid component. Left ovary Measurements: 8.1 x 4.6 x 8.4 cm = volume: 162.3 mL. 6.1 x 4.3 x 6.6 cm simple cyst. No internal vascularity or solid component. Other findings No abnormal free fluid. IMPRESSION: 1. 6.6 cm simple left ovarian cyst. Recommend follow-up US in 3-6 months given size. Note: This recommendation does not apply to premenarchal patients or to those with increased risk (genetic, family history, elevated tumor markers or other high-risk factors) of ovarian cancer. Reference: Radiology 2019 Nov; 293(2):359-371. 2. 3.3 cm complex right ovarian cyst, nonspecific, but favored to reflect a small endometrioma. Attention at follow-up recommended. 3. Normal sonographic appearance of the uterus and endometrium. Electronically Signed   By: Rise Mu M.D.   On: 03/27/2023 20:31   US PELVIS LIMITED (TRANSABDOMINAL ONLY)  Result Date: 03/12/2023 Both transabdominal and transvaginal techniques were necessary to evaluate the anatomy.  No latex used.  Anteverted uterus  normal in size and shape with no myometrial mass.  The uterus is measured at 7.71 x 5.2 x 4.09 cm.  The endometrial lining is measured at 11.53 mm with no mass and no vascularity seen.  The right ovary presents a 2.4 x 1.8 cm solid avascular area.  A 1.9 cm simple avascular follicle is also present.  The left ovary shows a stable 6.24 cm x 4.69 cm simple avascular cyst.  No adnexal mass seen.  No free fluid in the pelvis.    US Transvaginal Non-OB  Result Date: 03/12/2023  Both transabdominal and transvaginal techniques were necessary to evaluate the anatomy.  No latex used.  Anteverted uterus normal in size and shape with no myometrial mass.  The uterus is measured at 7.71 x 5.2 x 4.09 cm.  The endometrial lining is measured at 11.53 mm with no mass and no vascularity seen.  The right ovary presents a 2.4 x 1.8 cm solid avascular area.  A 1.9 cm simple avascular follicle is also present.  The left ovary shows a stable 6.24 cm x 4.69 cm simple avascular cyst.  No adnexal mass seen.  No free fluid in the pelvis.    MR ANKLE LEFT WO CONTRAST  Result Date: 01/22/2023 CLINICAL DATA:  Evaluate Achilles pain and swelling for 3.5 weeks. History of Achilles surgery 09/2020. EXAM: MRI OF THE LEFT ANKLE WITHOUT CONTRAST TECHNIQUE: Multiplanar, multisequence MR imaging of the ankle was performed. No intravenous contrast was administered. COMPARISON:  MRI left ankle 06/25/2021 FINDINGS: TENDONS Peroneal: The peroneus longus and brevis tendons are intact. Posteromedial: Intact tibialis posterior, flexor digitorum longus, and flexor hallucis longus tendons. Anterior: The tibialis anterior, extensor hallucis longus, and extensor digitorum longus tendons are intact. Achilles: Postsurgical changes are again seen of prior Achilles tendon repair. There is similar intermediate T2 signal within the distal anterior postsurgical Achilles tendon without a fluid bright tear or tendon retraction. Resolution of the prior  posterosuperior calcaneal marrow edema around the Achilles  tendon insertional screws. Trace fluid within the pre Achilles bursa. Plantar Fascia: Intact.  Small calcaneal heel spur. LIGAMENTS Lateral: The anterior and posterior talofibular, anterior and posterior tibiofibular, and calcaneofibular ligaments are intact. Medial: The tibiotalar deep deltoid and tibial spring ligaments are intact. CARTILAGE Ankle Joint: Intact cartilage. Subtalar Joints/Sinus Tarsi: The subtalar cartilage is intact.Fat is preserved within the sinus tarsi. Bones: No acute fracture. Mild dorsal navicular-cuneiform degenerative spurs. Other: None. IMPRESSION: Compared to 06/25/2021: Postsurgical changes of prior Achilles tendon repair. No fluid bright tear or tendon retraction. Resolution of the prior posterosuperior calcaneal marrow edema around the Achilles tendon insertional screws. Electronically Signed   By: Neita Garnet M.D.   On: 01/22/2023 15:47    Recent Results (from the past 2160 hour(s))  Urinalysis,Complete w/RFL Culture     Status: Abnormal   Collection Time: 03/21/23  1:43 PM  Result Value Ref Range   Color, Urine YELLOW YELLOW   APPearance CLOUDY (A) CLEAR   Specific Gravity, Urine 1.010 1.001 - 1.035   pH 5.5 5.0 - 8.0   Glucose, UA NEGATIVE NEGATIVE   Bilirubin Urine NEGATIVE NEGATIVE   Ketones, ur NEGATIVE NEGATIVE   Hgb urine dipstick 3+ (A) NEGATIVE   Protein, ur NEGATIVE NEGATIVE   Nitrites, Initial NEGATIVE NEGATIVE   Leukocyte Esterase 1+ (A) NEGATIVE   WBC, UA 6-10 (A) 0 - 5 /HPF   RBC / HPF 10-20 (A) 0 - 2 /HPF   Squamous Epithelial / HPF 0-5 < OR = 5 /HPF   Bacteria, UA MODERATE (A) NONE SEEN /HPF   Hyaline Cast NONE SEEN NONE SEEN /LPF   Urine-Other      Comment: CLUE CELLS PRESENT URINE CULTURE PENDING    Note      Comment: This urine was analyzed for the presence of WBC,  RBC, bacteria, casts, and other formed elements.  Only those elements seen were reported. . .   Urine  Culture     Status: None   Collection Time: 03/21/23  1:43 PM  Result Value Ref Range   MICRO NUMBER: [id number]    SPECIMEN QUALITY: Adequate    Sample Source URINE    STATUS: FINAL    Result:      Mixed genital flora isolated. These superficial bacteria are not indicative of a urinary tract infection. No further organism identification is warranted on this specimen. If clinically indicated, recollect clean-catch, mid-stream urine and transfer  immediately to Urine Culture Transport Tube.   REFLEXIVE URINE CULTURE     Status: None   Collection Time: 03/21/23  1:43 PM  Result Value Ref Range   REFLEXIVE URINE CULTURE      Comment: CULTURE INDICATED - RESULTS TO FOLLOW  WET PREP FOR TRICH, YEAST, CLUE     Status: None   Collection Time: 03/21/23  2:23 PM   Specimen: Genital  Result Value Ref Range   Source: VAGINA    RESULT      Comment: EPITHELIAL CELLS-PRESENT CLUE CELLS-PRESENT (POSITIVE ODOR) YEAST-NONE SEEN TRICHOMONAS-NONE SEEN WBC-NONE SEEN BACTERIA-MANY EPITH. CELLS (7-12) PER HPF   SureSwab Advanced Vaginitis, TMA     Status: Abnormal   Collection Time: 03/21/23  3:40 PM   Specimen: Genital  Result Value Ref Range   SURESWAB(R) ADV BACTERIAL VAGINOSIS(BV),TMA POSITIVE (A) NEGATIVE   CANDIDA SPECIES NOT DETECTED NOT DETECTED   Candida glabrata NOT DETECTED NOT DETECTED   TRICHOMONAS VAGINALIS (TV),TMA NOT DETECTED NOT DETECTED    Comment: . Candida species C. albicans, C.  tropicalis, C. parapsilosis, and/or C. dubliniensis can be detected, but not differentiated, in the Candida spp. result.    Lipase, blood     Status: None   Collection Time: 04/15/23  4:14 AM  Result Value Ref Range   Lipase 28 11 - 51 U/L    Comment: Performed at [facility], [address] [place], [place] [address]  Comprehensive metabolic panel     Status: Abnormal   Collection Time: 04/15/23  4:14 AM  Result Value Ref Range   Sodium 138 135 - 145 mmol/L   Potassium  4.3 3.5 - 5.1 mmol/L   Chloride 106 98 - 111 mmol/L   CO2 24 22 - 32 mmol/L   Glucose, Bld 125 (H) 70 - 99 mg/dL    Comment: Glucose reference range applies only to samples taken after fasting for at least 8 hours.   BUN 16 6 - 20 mg/dL   Creatinine, Ser 9.14 0.44 - 1.00 mg/dL   Calcium 8.8 (L) 8.9 - 10.3 mg/dL   Total Protein 6.8 6.5 - 8.1 g/dL   Albumin 4.0 3.5 - 5.0 g/dL   AST 24 15 - 41 U/L   ALT 31 0 - 44 U/L   Alkaline Phosphatase 66 38 - 126 U/L   Total Bilirubin 0.7 0.3 - 1.2 mg/dL   GFR, Estimated >78 >29 mL/min    Comment: (NOTE) Calculated using the CKD-EPI Creatinine Equation (2021)    Anion gap 8 5 - 15    Comment: Performed at [facility], [address], [place], [place] [address]  CBC     Status: None   Collection Time: 04/15/23  4:14 AM  Result Value Ref Range   WBC 9.7 4.0 - 10.5 K/uL   RBC 4.71 3.87 - 5.11 MIL/uL   Hemoglobin 14.4 12.0 - 15.0 g/dL   HCT 08.6 57.8 - 46.9 %   MCV 89.6 80.0 - 100.0 fL   MCH 30.6 26.0 - 34.0 pg   MCHC 34.1 30.0 - 36.0 g/dL   RDW 62.9 52.8 - 41.3 %   Platelets 277 150 - 400 K/uL   nRBC 0.0 0.0 - 0.2 %    Comment: Performed at [facility], [address], [place], [place] [address]  Urinalysis, Routine w reflex microscopic -Urine, Clean Catch     Status: Abnormal   Collection Time: 04/15/23  4:14 AM  Result Value Ref Range   Color, Urine ORANGE (A) YELLOW    Comment: BIOCHEMICALS MAY BE AFFECTED BY COLOR   APPearance HAZY (A) CLEAR   Specific Gravity, Urine 1.018 1.005 - 1.030   pH 5.0 5.0 - 8.0   Glucose, UA NEGATIVE NEGATIVE mg/dL   Hgb urine dipstick LARGE (A) NEGATIVE   Bilirubin Urine NEGATIVE NEGATIVE   Ketones, ur NEGATIVE NEGATIVE mg/dL   Protein, ur TRACE (A) NEGATIVE mg/dL   Nitrite NEGATIVE NEGATIVE   Leukocytes,Ua SMALL (A) NEGATIVE   RBC / HPF >50 0 - 5 RBC/hpf   WBC, UA >50 0 - 5 WBC/hpf   Bacteria, UA FEW (A) NONE SEEN   Squamous Epithelial / HPF 0-5 0 - 5 /HPF    Crystals PRESENT (A) NEGATIVE    Comment: Performed at [facility], [address] [place], [place] [address]  Pregnancy, urine     Status: None   Collection Time: 04/15/23  4:14 AM  Result Value Ref Range   Preg Test, Ur NEGATIVE NEGATIVE    Comment:  THE SENSITIVITY OF THIS METHODOLOGY IS >25 mIU/mL. Performed at [facility], [address] [place], [place] [address]   POCT urinalysis dipstick     Status: Abnormal   Collection Time: 04/16/23  8:41 AM  Result Value Ref Range   Color, UA yellow yellow   Clarity, UA clear clear   Glucose, UA negative negative mg/dL   Bilirubin, UA negative negative   Ketones, POC UA negative negative mg/dL   Spec Grav, UA 1.914 7.829 - 1.025   Blood, UA large (A) negative   pH, UA 6.0 5.0 - 8.0   Protein Ur, POC negative negative mg/dL   Urobilinogen, UA 0.2 0.2 or 1.0 E.U./dL   Nitrite, UA Negative Negative   Leukocytes, UA Negative Negative     IM Toradol 60 mg administered in clinic.  Assessment and Plan :   PDMP not reviewed this encounter.  1. Acute pelvic pain, female   2. Cysts of both ovaries    I initially prescribed celecoxib but patient prefers to avoid any chance of cross-reactivity with her sulfa drug allergy.  She has previously done well with meloxicam and therefore will be using this, celecoxib prescription discontinued.  Recommend recheck of bacterial vaginosis and if she is still positive, will need treatment this time with metronidazole.  Otherwise maintain follow-up with her new gynecologist.  Counseled patient on potential for adverse effects with medications prescribed/recommended today, ER and return-to-clinic precautions discussed, patient verbalized understanding.    Wallis Bamberg, [place] 04/16/23 [phone]

## 2023-04-16 NOTE — ED Triage Notes (Signed)
Pt report pelvic pain x 2 days. Pt reports she went to the ED yesterday and was told due to the pain. Pt was told she was passing "crystals in my urine". States she knows she has 2 ovarian cyst and has an appt with her OB/GYN tomorrow 03/18/23. Tylenol ibuprofen gives "very little" relief.

## 2023-04-17 LAB — CERVICOVAGINAL ANCILLARY ONLY
Bacterial Vaginitis (gardnerella): POSITIVE — AB
Comment: NEGATIVE

## 2023-04-18 ENCOUNTER — Telehealth (HOSPITAL_COMMUNITY): Payer: Self-pay | Admitting: Emergency Medicine

## 2023-04-18 MED ORDER — METRONIDAZOLE 500 MG PO TABS
500.0000 mg | ORAL_TABLET | Freq: Two times a day (BID) | ORAL | 0 refills | Status: DC
  Filled 2023-04-18: qty 14, 7d supply, fill #0

## 2023-04-19 ENCOUNTER — Other Ambulatory Visit (HOSPITAL_BASED_OUTPATIENT_CLINIC_OR_DEPARTMENT_OTHER): Payer: Self-pay

## 2023-04-22 IMAGING — MR MR ANKLE*L* W/O CM
4 of 6 series · 13 of 40 positions shown · non-contrast
Comparison: None.

CLINICAL DATA: Left Achilles pain and swelling. History of surgery
10/03/2020.

EXAM:
MRI OF THE LEFT ANKLE WITHOUT CONTRAST
TECHNIQUE: Multiplanar, multisequence MR imaging of the ankle was performed. No
intravenous contrast was administered.

[Series 3: PD fat-sat · axial · left · 3.0mm · 0.25mm/px · z∈[-88,+28]mm · 4 of 36 slices shown]
[im 1/36]
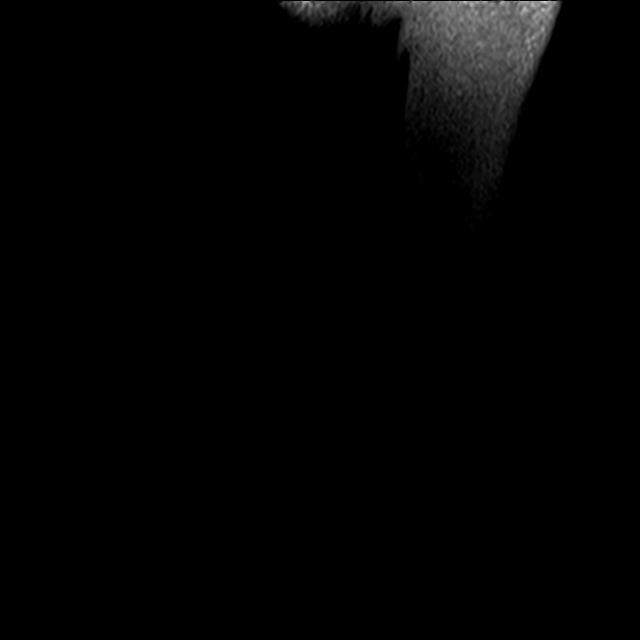
[im 6/36]
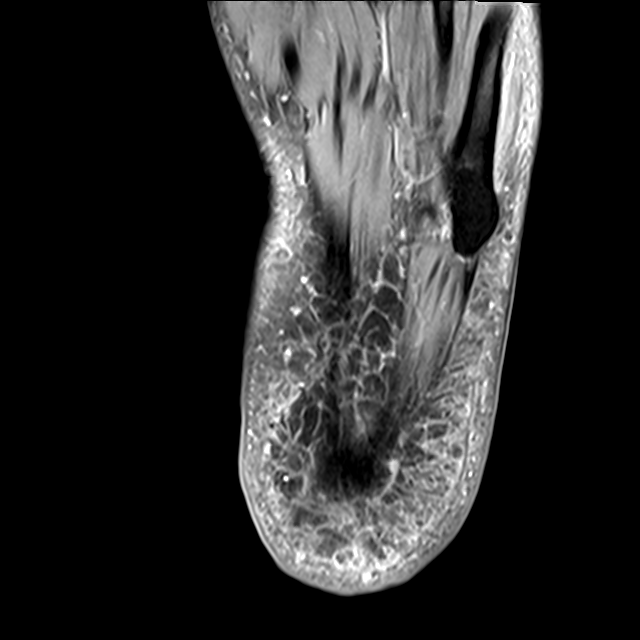
[im 18/36]
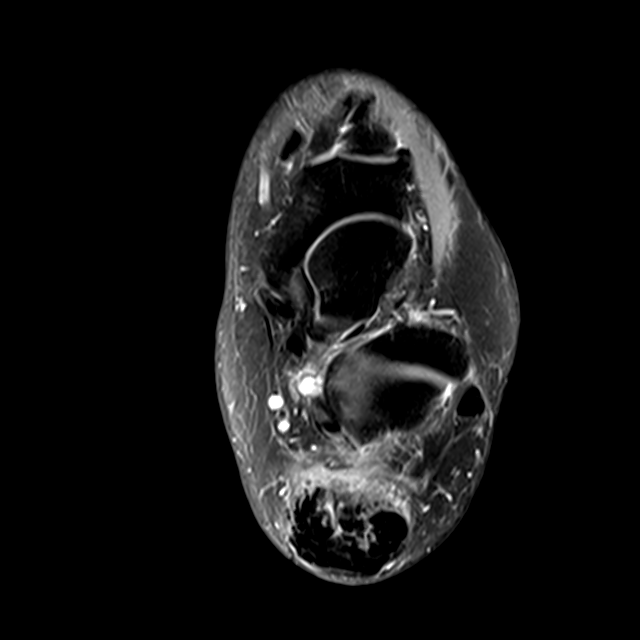
[im 30/36]
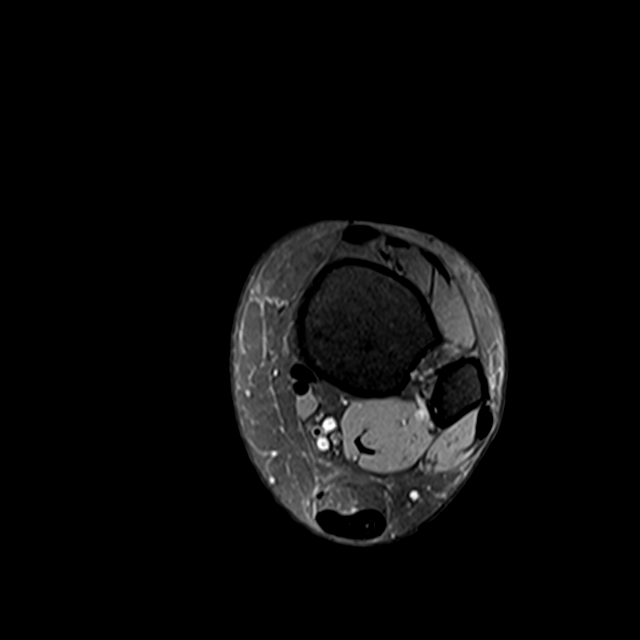

[Series 4: T2 fat-sat · axial · left · 3.0mm · 0.25mm/px · z∈[-68,+32]mm · 3 of 36 slices shown (1 of 2)]
[im 6/36]
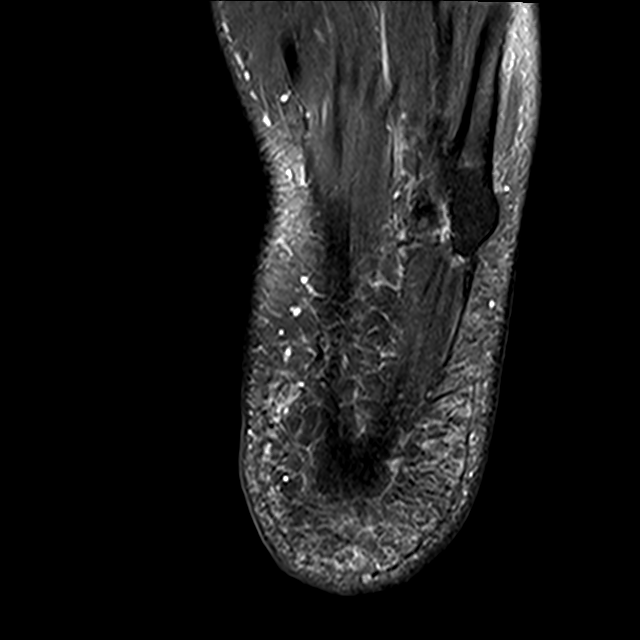
[im 21/36]
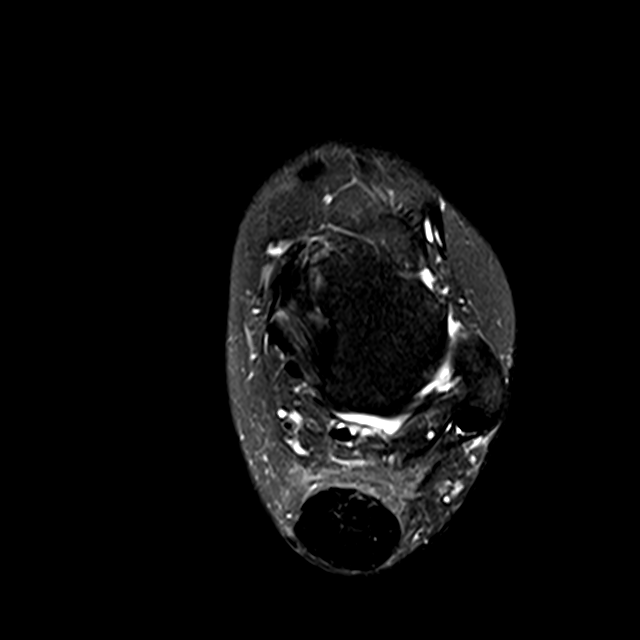
[im 31/36]
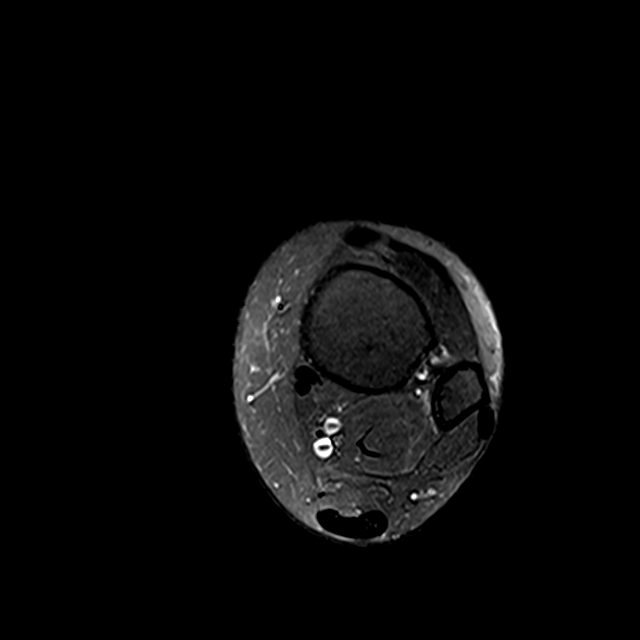

[Series 5: T1 · axial · left · 3.0mm · 0.25mm/px · z∈[-67,+30]mm · 3 of 36 slices shown]
[im 6/36]
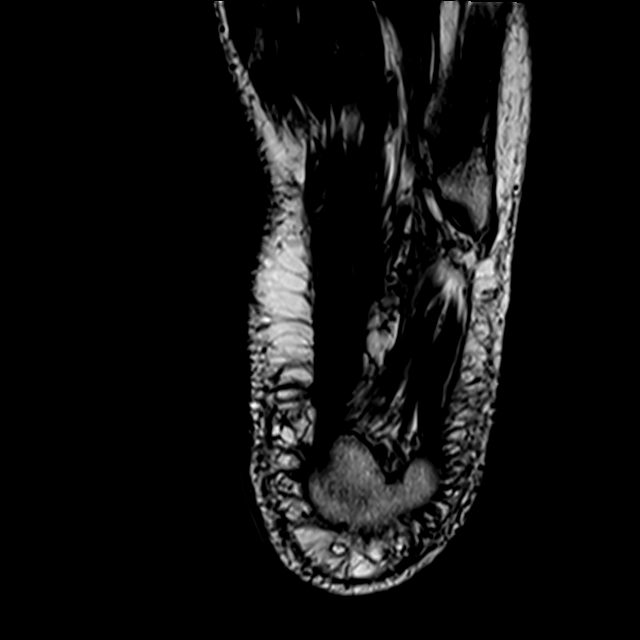
[im 21/36]
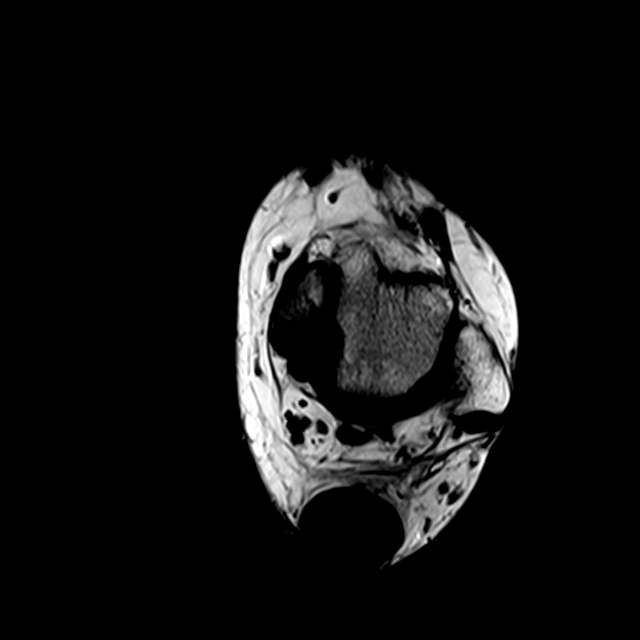
[im 31/36]
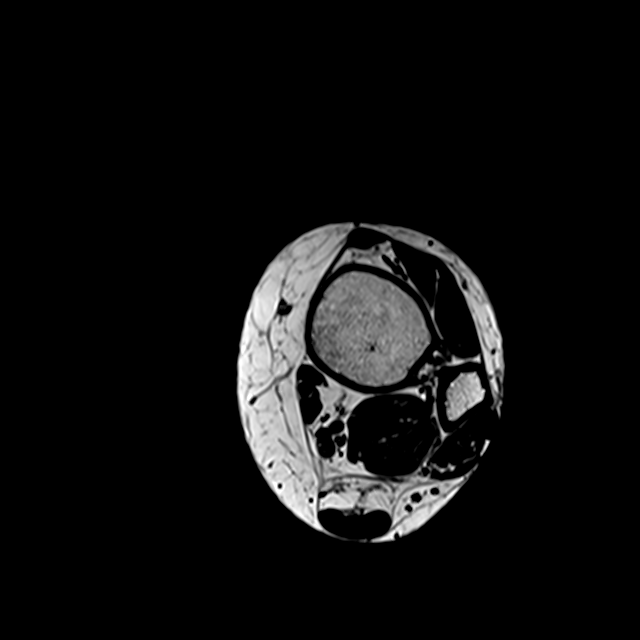

[Series 8: T2 fat-sat · coronal · left · 3.0mm · 0.27mm/px · 3 of 33 slices shown (2 of 2)]
[im 6/33]
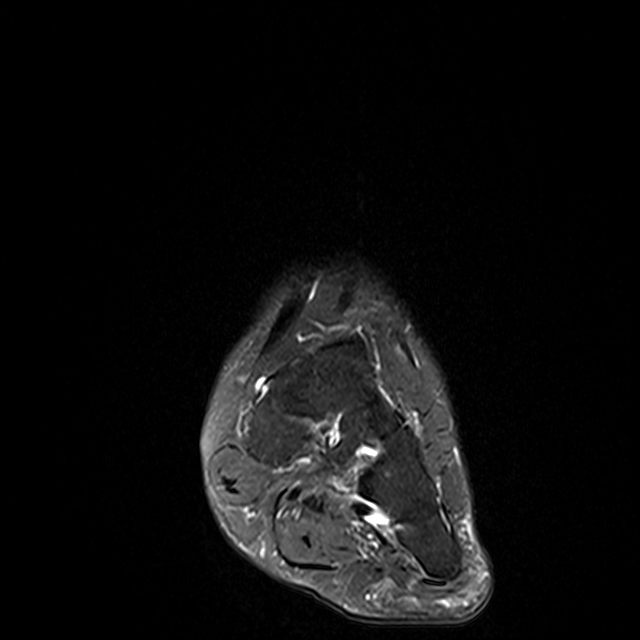
[im 17/33]
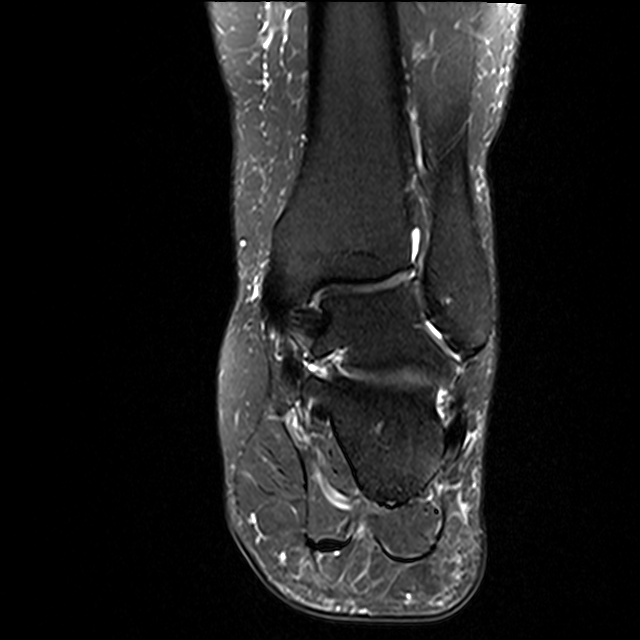
[im 27/33]
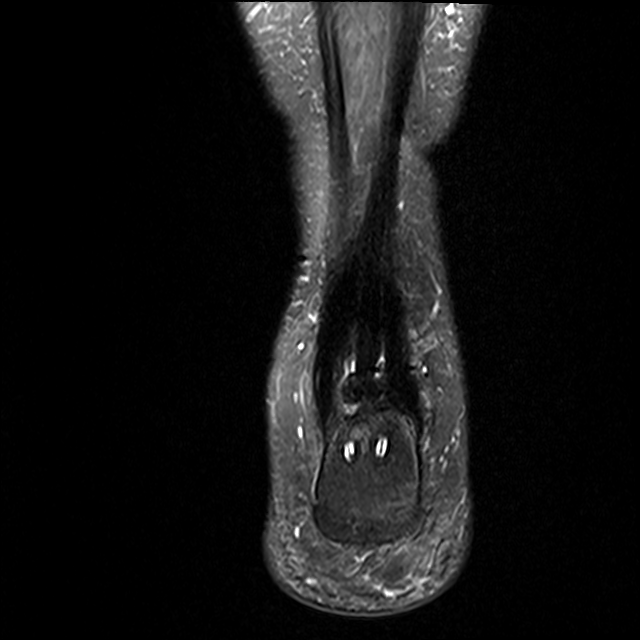

[13 of 40 positions shown; findings below may reference images not displayed]

FINDINGS: TENDONS

Peroneal: Peroneal longus tendon intact. Peroneal brevis intact.

Posteromedial: Posterior tibial tendon intact. Flexor hallucis
longus tendon intact. Flexor digitorum longus tendon intact.

Anterior: Tibialis anterior tendon intact. Extensor hallucis longus
tendon intact Extensor digitorum longus tendon intact.

Achilles: Thickening of the distal Achilles tendon consistent with
postsurgical changes with the overall tendon intact. Subcortical
bone marrow edema in the superior posterior calcaneus at the
Achilles insertion.

Plantar Fascia: Intact.

LIGAMENTS

Lateral: Anterior talofibular ligament intact. Calcaneofibular
ligament intact. Posterior talofibular ligament intact. Anterior and
posterior tibiofibular ligaments intact.

Medial: Deltoid ligament intact. Spring ligament intact.

CARTILAGE

Ankle Joint: No joint effusion. Normal ankle mortise. No chondral
defect.

Subtalar Joints/Sinus Tarsi: Normal subtalar joints. No subtalar
joint effusion. Normal sinus tarsi.

Bones: No marrow signal abnormality.  No fracture or dislocation.

Soft Tissue: No fluid collection or hematoma. Muscles are normal
without edema or atrophy. Tarsal tunnel is normal.
IMPRESSION: 1. Thickening of the distal Achilles tendon consistent with
postsurgical changes with the overall tendon intact. Subcortical
bone marrow edema in the superior posterior calcaneus at the
Achilles insertion.

## 2023-05-08 ENCOUNTER — Other Ambulatory Visit: Payer: Self-pay | Admitting: Student

## 2023-05-08 DIAGNOSIS — R102 Pelvic and perineal pain: Secondary | ICD-10-CM

## 2023-05-08 DIAGNOSIS — N949 Unspecified condition associated with female genital organs and menstrual cycle: Secondary | ICD-10-CM

## 2023-05-16 ENCOUNTER — Encounter (HOSPITAL_BASED_OUTPATIENT_CLINIC_OR_DEPARTMENT_OTHER): Payer: Self-pay | Admitting: Emergency Medicine

## 2023-05-16 ENCOUNTER — Emergency Department (HOSPITAL_BASED_OUTPATIENT_CLINIC_OR_DEPARTMENT_OTHER)
Admission: EM | Admit: 2023-05-16 | Discharge: 2023-05-16 | Disposition: A | Discharge status: Home / Self Care | Payer: Managed Care, Other (non HMO) | Attending: Emergency Medicine | Admitting: Emergency Medicine

## 2023-05-16 ENCOUNTER — Other Ambulatory Visit: Payer: Self-pay

## 2023-05-16 ENCOUNTER — Emergency Department (HOSPITAL_BASED_OUTPATIENT_CLINIC_OR_DEPARTMENT_OTHER): Payer: Managed Care, Other (non HMO)

## 2023-05-16 ENCOUNTER — Other Ambulatory Visit (HOSPITAL_BASED_OUTPATIENT_CLINIC_OR_DEPARTMENT_OTHER): Payer: Self-pay

## 2023-05-16 DIAGNOSIS — N83202 Unspecified ovarian cyst, left side: Secondary | ICD-10-CM | POA: Insufficient documentation

## 2023-05-16 DIAGNOSIS — R102 Pelvic and perineal pain: Secondary | ICD-10-CM | POA: Diagnosis present

## 2023-05-16 DIAGNOSIS — N838 Other noninflammatory disorders of ovary, fallopian tube and broad ligament: Secondary | ICD-10-CM

## 2023-05-16 LAB — CBC WITH DIFFERENTIAL/PLATELET
Abs Immature Granulocytes: 0.03 10*3/uL (ref 0.00–0.07)
Basophils Absolute: 0.1 10*3/uL (ref 0.0–0.1)
Basophils Relative: 1 %
Eosinophils Absolute: 0.2 10*3/uL (ref 0.0–0.5)
Eosinophils Relative: 3 %
HCT: 42.7 % (ref 36.0–46.0)
Hemoglobin: 14.8 g/dL (ref 12.0–15.0)
Immature Granulocytes: 0 %
Lymphocytes Relative: 33 %
Lymphs Abs: 2.7 10*3/uL (ref 0.7–4.0)
MCH: 31 pg (ref 26.0–34.0)
MCHC: 34.7 g/dL (ref 30.0–36.0)
MCV: 89.3 fL (ref 80.0–100.0)
Monocytes Absolute: 0.4 10*3/uL (ref 0.1–1.0)
Monocytes Relative: 5 %
Neutro Abs: 4.7 10*3/uL (ref 1.7–7.7)
Neutrophils Relative %: 58 %
Platelets: 278 10*3/uL (ref 150–400)
RBC: 4.78 MIL/uL (ref 3.87–5.11)
RDW: 12.6 % (ref 11.5–15.5)
WBC: 8.1 10*3/uL (ref 4.0–10.5)
nRBC: 0 % (ref 0.0–0.2)

## 2023-05-16 LAB — BASIC METABOLIC PANEL
Anion gap: 9 (ref 5–15)
BUN: 14 mg/dL (ref 6–20)
CO2: 28 mmol/L (ref 22–32)
Calcium: 9.1 mg/dL (ref 8.9–10.3)
Chloride: 105 mmol/L (ref 98–111)
Creatinine, Ser: 0.84 mg/dL (ref 0.44–1.00)
GFR, Estimated: >60 mL/min (ref >60)
Glucose, Bld: 98 mg/dL (ref 70–99)
Potassium: 3.6 mmol/L (ref 3.5–5.1)
Sodium: 142 mmol/L (ref 135–145)

## 2023-05-16 LAB — URINALYSIS, ROUTINE W REFLEX MICROSCOPIC
Bacteria, UA: NONE SEEN
Bilirubin Urine: NEGATIVE
Glucose, UA: NEGATIVE mg/dL
Ketones, ur: NEGATIVE mg/dL
Leukocytes,Ua: NEGATIVE
Nitrite: NEGATIVE
Protein, ur: NEGATIVE mg/dL
Specific Gravity, Urine: 1.024 (ref 1.005–1.030)
pH: 6 (ref 5.0–8.0)

## 2023-05-16 LAB — PREGNANCY, URINE: Preg Test, Ur: NEGATIVE

## 2023-05-16 MED ORDER — HYDROCODONE-ACETAMINOPHEN 5-325 MG PO TABS
1.0000 | ORAL_TABLET | Freq: Four times a day (QID) | ORAL | 0 refills | Status: DC | PRN
Start: 2023-05-16 — End: 2023-11-22

## 2023-05-16 MED ORDER — KETOROLAC TROMETHAMINE 30 MG/ML IJ SOLN: 30.0000 mg | Freq: Once | INTRAMUSCULAR | Status: DC

## 2023-05-16 MED ORDER — KETOROLAC TROMETHAMINE 60 MG/2ML IM SOLN
60.0000 mg | Freq: Once | INTRAMUSCULAR | Status: AC
  Administered 2023-05-16: 60 mg via INTRAMUSCULAR
  Filled 2023-05-16: qty 2

## 2023-05-16 NOTE — ED Triage Notes (Signed)
Pt arrives pov, steady gait, endorses dx with ovarian cyst. Pt c/o pelvic pain, worsening overnight

## 2023-05-16 NOTE — ED Provider Notes (Signed)
[place] EMERGENCY DEPARTMENT AT [facility] Provider Note   CSN: [id number] Arrival date & time: 05/16/23  0701     History  Chief Complaint  Patient presents with   Abdominal Pain    Rebekah Davis is a 37 y.o. female.  She is here with a complaint of worsening pelvic pain since yesterday.  She has had ongoing pelvic symptoms for the last 10 months.  She has ovarian cysts and is following with GYN.  She is scheduled for an MRI next week.  Pain has worsened over the last day or so, not responsive to Tylenol and ibuprofen.  No vaginal bleeding no discharge, not sexually active.  No concerns for STD.  No prior pregnancies.  No fevers chills nausea vomiting   Abdominal Pain Pain location:  RLQ, LLQ and suprapubic Pain quality: aching   Pain radiates to:  Does not radiate Pain severity:  Severe Onset quality:  Gradual Duration:  2 days Timing:  Constant Progression:  Unchanged Chronicity:  Recurrent Context: not trauma   Relieved by:  Nothing Worsened by:  Nothing Ineffective treatments:  Acetaminophen and NSAIDs Associated symptoms: no chest pain, no constipation, no cough, no diarrhea, no dysuria, no fever, no nausea, no vaginal bleeding, no vaginal discharge and no vomiting        Home Medications Prior to Admission medications   Medication Sig Start Date End Date Taking? Authorizing Provider  metroNIDAZOLE (FLAGYL) 500 MG tablet Take 1 tablet (500 mg total) by mouth 2 (two) times daily. 04/18/23   LampteyBritta Mccreedy, MD  Acetaminophen (TYLENOL PO) Take by mouth.    [provider]  albuterol (VENTOLIN HFA) 108 (90 Base) MCG/ACT inhaler Inhale 2 puffs into the lungs as needed for wheezing or shortness of breath. 08/03/22   Dulce Sellar, NP  ARIPiprazole (ABILIFY) 10 MG tablet Take 10 mg by mouth daily. 10/29/22   [provider]  Cholecalciferol (VITAMIN D3) 125 MCG (5000 UT) CAPS Take 1 capsule by mouth daily. Patient not taking: Reported on  03/21/2023 02/04/23   [provider]  escitalopram (LEXAPRO) 10 MG tablet TAKE 1 TABLET(10 MG) BY MOUTH DAILY Patient taking differently: Take 10 mg by mouth daily. 06/29/22   Dulce Sellar, NP  escitalopram (LEXAPRO) 5 MG tablet Take 5 mg by mouth daily.    [provider]  hydrOXYzine (ATARAX) 10 MG tablet SMARTSIG:1 Tablet(s) By Mouth 1-3 Times Daily PRN 02/03/23   [provider]  hydrOXYzine (ATARAX) 25 MG tablet Take 1 tablet (25 mg total) by mouth 3 (three) times daily as needed for anxiety. 11/21/22   Lenard Lance, FNP  IBUPROFEN PO Take by mouth.    [provider]  LORazepam (ATIVAN) 0.5 MG tablet Take 0.25-0.5 mg by mouth every 4 (four) hours as needed. 02/18/23   [provider]  meloxicam (MOBIC) 15 MG tablet Take 1 tablet (15 mg total) by mouth daily. 04/16/23   Wallis Bamberg, PA-C  omeprazole (PRILOSEC) 20 MG capsule Take 1 capsule (20 mg total) by mouth daily. 04/16/23   Wallis Bamberg, PA-C      Allergies    Adhesive [tape], Cherry, Sulfamethoxazole-trimethoprim, Levofloxacin, Morphine, Gabapentin, Metformin, and Sumatriptan    Review of Systems   Review of Systems  Constitutional:  Negative for fever.  Respiratory:  Negative for cough.   Cardiovascular:  Negative for chest pain.  Gastrointestinal:  Positive for abdominal pain. Negative for constipation, diarrhea, nausea and vomiting.  Genitourinary:  Positive for pelvic pain. Negative  for dysuria, frequency, vaginal bleeding and vaginal discharge.    Physical Exam Updated Vital Signs BP (!) 147/95   Pulse (!) 116   Temp 98.2 F (36.8 C) (Oral)   Resp 18   Ht 5\' 5"  (1.651 m)   Wt (!) 157.4 kg   LMP 04/10/2023 (Exact Date) Comment: not sexually active  SpO2 100%   BMI 57.74 kg/m  Physical Exam Vitals and nursing note reviewed.  Constitutional:      General: She is not in acute distress.    Appearance: Normal appearance. She is well-developed. She is obese.  HENT:     Head:  Normocephalic and atraumatic.  Eyes:     Conjunctiva/sclera: Conjunctivae normal.  Cardiovascular:     Rate and Rhythm: Normal rate and regular rhythm.     Heart sounds: No murmur heard. Pulmonary:     Effort: Pulmonary effort is normal. No respiratory distress.     Breath sounds: Normal breath sounds.  Abdominal:     Palpations: Abdomen is soft.     Tenderness: There is no abdominal tenderness. There is no guarding or rebound.  Musculoskeletal:        General: No swelling.     Cervical back: Neck supple.  Skin:    General: Skin is warm and dry.     Capillary Refill: Capillary refill takes less than 2 seconds.  Neurological:     General: No focal deficit present.     Mental Status: She is alert.     Gait: Gait normal.     ED Results / Procedures / Treatments   Labs (all labs ordered are listed, but only abnormal results are displayed) Labs Reviewed  URINALYSIS, ROUTINE W REFLEX MICROSCOPIC - Abnormal; Notable for the following components:      Result Value   Hgb urine dipstick TRACE (*)    All other components within normal limits  BASIC METABOLIC PANEL  CBC WITH DIFFERENTIAL/PLATELET  PREGNANCY, URINE    EKG None  Radiology US PELVIC COMPLETE W TRANSVAGINAL AND TORSION R/O  Result Date: 05/16/2023 CLINICAL DATA:  Pelvic pain. EXAM: TRANSABDOMINAL AND TRANSVAGINAL ULTRASOUND OF PELVIS DOPPLER ULTRASOUND OF OVARIES TECHNIQUE: Both transabdominal and transvaginal ultrasound examinations of the pelvis were performed. Transabdominal technique was performed for global imaging of the pelvis including uterus, ovaries, adnexal regions, and pelvic cul-de-sac. It was necessary to proceed with endovaginal exam following the transabdominal exam to visualize the endometrium and ovaries. Color and duplex Doppler ultrasound was utilized to evaluate blood flow to the ovaries. COMPARISON:  March 27, 2023.  December 10, 2022. FINDINGS: Uterus Measurements: 8.7 x 5.3 x 4.3 cm = volume: 103 mL.  No fibroids or other mass visualized. Endometrium Thickness: 8 mm which is within normal limits. No focal abnormality visualized. Right ovary Measurements: 5.9 x 5.0 x 4.9 cm = volume: 75 mL. 3.9 x 3.8 x 3.5 cm complex cyst is noted which is slightly enlarged compared to prior exam. Left ovary Measurements: 7.6 x 7 2 x 5.1 cm = volume: 145 mL. 6.2 cm simple cyst is noted which is not significantly changed compared to prior exam. Pulsed Doppler evaluation of both ovaries demonstrates normal low-resistance arterial and venous waveforms. Other findings No abnormal free fluid. IMPRESSION: No evidence of ovarian torsion. Grossly stable 6.2 cm simple left ovarian cyst is noted. Recommend follow-up US in 3-6 months. Note: This recommendation does not apply to premenarchal patients or to those with increased risk (genetic, family history, elevated tumor markers or other high-risk  factors) of ovarian cancer. Reference: Radiology 2019 Nov; 293(2):359-371. 3.9 x 3.8 x 3.5 cm complex cystic structure is seen in right ovary which is slightly enlarged compared to prior exam of March 27, 2023. Given the lack of change, hemorrhagic cyst is consider less likely. Potentially this may represent endometrioma. This abnormality was not present on the prior exam of December 10, 2022. Attention to this abnormality on follow-up imaging is recommended to rule out the possibility of neoplasm. Electronically Signed   By: Lupita Raider M.D.   On: 05/16/2023 09:33    Procedures Procedures    Medications Ordered in ED Medications  ketorolac (TORADOL) 30 MG/ML injection 30 mg (has no administration in time range)    ED Course/ Medical Decision Making/ A&P Clinical Course as of 05/16/23 1734  Thu May 16, 2023  4098 I reviewed the results of her labs and ultrasound with her.  She is comfortable plan for discharge, has taken hydrocodone in the past so we will prescribe.  She has a gynecologist to follow-up with and has further imaging  already scheduled. [MB]    Clinical Course User Index [MB] Terrilee Files, MD                                 Medical Decision Making Amount and/or Complexity of Data Reviewed Labs: ordered. Radiology: ordered.  Risk Prescription drug management.   This patient complains of Pelvic pain; this involves an extensive number of treatment Options and is a complaint that carries with it a high risk of complications and morbidity. The differential includes UTI, ovarian cyst, torsion, mass, endometriosis  I ordered, reviewed and interpreted labs, which included CBC normal chemistries normal urinalysis without signs of infection pregnancy negative I ordered medication IM Toradol and reviewed PMP when indicated. I ordered imaging studies which included pelvic ultrasound and I independently    visualized and interpreted imaging which showed no evidence of torsion.  Does have ovarian cyst and ovarian mass Previous records obtained and reviewed in epic including recent gynecology notes Cardiac monitoring reviewed, sinus tachycardia Social determinants considered, no significant barriers Critical Interventions: None  After the interventions stated above, I reevaluated the patient and found patient's pain to be somewhat improved Admission and further testing considered, no indications for admission or further workup at this time.  This has been an ongoing problem for months.  She has adequate follow-up with her gynecologist and outpatient imaging ordered.  Return instructions discussed.         Final Clinical Impression(s) / ED Diagnoses Final diagnoses:  Pelvic pain  Left ovarian cyst  Ovarian mass, right    Rx / DC Orders ED Discharge Orders          Ordered    HYDROcodone-acetaminophen (NORCO/VICODIN) 5-325 MG tablet  Every 6 hours PRN        05/16/23 0943              Terrilee Files, MD 05/16/23 1737

## 2023-05-16 NOTE — ED Notes (Signed)
Not in room, pt in [place].

## 2023-05-16 NOTE — Discharge Instructions (Addendum)
You were seen in the emergency department for worsening pelvic pain.  Your ultrasound showed a stable cyst on the left and a ovarian mass on the right that was slightly larger than your prior ultrasound.  We are prescribing you a short course of pain medication, you can also continue Tylenol and ibuprofen.  Follow-up with your gynecologist.  Return to the emergency department if any worsening or concerning symptoms

## 2023-05-16 NOTE — ED Notes (Signed)
Back from b/r, IV started, back to b/r. Steady gait. Endorses LLQ pressure. Denies other sx.

## 2023-05-21 ENCOUNTER — Encounter: Payer: Self-pay | Admitting: Student

## 2023-05-22 ENCOUNTER — Telehealth: Payer: Self-pay

## 2023-05-22 NOTE — Telephone Encounter (Signed)
Transition Care Management Unsuccessful Follow-up Telephone Call  Date of discharge and from where:  [facility] 8/1  Attempts:  1st Attempt  Reason for unsuccessful TCM follow-up call:  No answer/busy   Lenard Forth [facility] Guide, [facility] [phone] [address] [place], [place], [place] [address] Phone: [phone] Email: [email or web address]

## 2023-05-23 ENCOUNTER — Telehealth: Payer: Self-pay

## 2023-05-23 NOTE — Telephone Encounter (Signed)
Transition Care Management Follow-up Telephone Call Date of discharge and from where: [facility] 8/1 How have you been since you were released from the hospital? Still in pain  Any questions or concerns? No  Items Reviewed: Did the pt receive and understand the discharge instructions provided? Yes  Medications obtained and verified? No  Other? No  Any new allergies since your discharge? No  Dietary orders reviewed? No Do you have support at home? No     Follow up appointments reviewed:  PCP Hospital f/u appt confirmed? Yes Scheduled to see 8/15 on  @  NEW PCP 1ST APPOINTMENT  Specialist Hospital f/u appt confirmed? Yes Scheduled to see 9/9 on  @ . Are transportation arrangements needed? Yes  If their condition worsens, is the pt aware to call PCP or go to the Emergency Dept.? Yes Was the patient provided with contact information for the PCP's office or ED? Yes Was to pt encouraged to call back with questions or concerns? Yes

## 2023-05-24 ENCOUNTER — Ambulatory Visit: Admission: RE | Admit: 2023-05-24 | Payer: Medicare Other | Source: Ambulatory Visit

## 2023-05-24 DIAGNOSIS — R102 Pelvic and perineal pain: Secondary | ICD-10-CM

## 2023-05-24 DIAGNOSIS — N949 Unspecified condition associated with female genital organs and menstrual cycle: Secondary | ICD-10-CM

## 2023-06-08 ENCOUNTER — Emergency Department (HOSPITAL_COMMUNITY): Payer: Managed Care, Other (non HMO)

## 2023-06-08 ENCOUNTER — Emergency Department (HOSPITAL_BASED_OUTPATIENT_CLINIC_OR_DEPARTMENT_OTHER): Payer: Managed Care, Other (non HMO)

## 2023-06-08 ENCOUNTER — Other Ambulatory Visit: Payer: Self-pay

## 2023-06-08 ENCOUNTER — Emergency Department (HOSPITAL_BASED_OUTPATIENT_CLINIC_OR_DEPARTMENT_OTHER)
Admission: EM | Admit: 2023-06-08 | Discharge: 2023-06-08 | Disposition: A | Discharge status: Home / Self Care | Payer: Managed Care, Other (non HMO) | Attending: Emergency Medicine | Admitting: Emergency Medicine

## 2023-06-08 ENCOUNTER — Encounter (HOSPITAL_BASED_OUTPATIENT_CLINIC_OR_DEPARTMENT_OTHER): Payer: Self-pay | Admitting: Emergency Medicine

## 2023-06-08 ENCOUNTER — Other Ambulatory Visit (HOSPITAL_COMMUNITY): Payer: Self-pay | Admitting: Radiology

## 2023-06-08 DIAGNOSIS — N83201 Unspecified ovarian cyst, right side: Secondary | ICD-10-CM

## 2023-06-08 DIAGNOSIS — R1031 Right lower quadrant pain: Secondary | ICD-10-CM | POA: Diagnosis present

## 2023-06-08 DIAGNOSIS — N83202 Unspecified ovarian cyst, left side: Secondary | ICD-10-CM | POA: Insufficient documentation

## 2023-06-08 LAB — CBC WITH DIFFERENTIAL/PLATELET
Abs Immature Granulocytes: 0.03 10*3/uL (ref 0.00–0.07)
Basophils Absolute: 0.1 10*3/uL (ref 0.0–0.1)
Basophils Relative: 1 %
Eosinophils Absolute: 0.3 10*3/uL (ref 0.0–0.5)
Eosinophils Relative: 3 %
HCT: 41.2 % (ref 36.0–46.0)
Hemoglobin: 14.3 g/dL (ref 12.0–15.0)
Immature Granulocytes: 0 %
Lymphocytes Relative: 40 %
Lymphs Abs: 3.7 10*3/uL (ref 0.7–4.0)
MCH: 31.4 pg (ref 26.0–34.0)
MCHC: 34.7 g/dL (ref 30.0–36.0)
MCV: 90.5 fL (ref 80.0–100.0)
Monocytes Absolute: 0.7 10*3/uL (ref 0.1–1.0)
Monocytes Relative: 8 %
Neutro Abs: 4.5 10*3/uL (ref 1.7–7.7)
Neutrophils Relative %: 48 %
Platelets: 237 10*3/uL (ref 150–400)
RBC: 4.55 MIL/uL (ref 3.87–5.11)
RDW: 12.7 % (ref 11.5–15.5)
WBC: 9.3 10*3/uL (ref 4.0–10.5)
nRBC: 0 % (ref 0.0–0.2)

## 2023-06-08 LAB — URINALYSIS, ROUTINE W REFLEX MICROSCOPIC
Bacteria, UA: NONE SEEN
Bilirubin Urine: NEGATIVE
Glucose, UA: NEGATIVE mg/dL
Ketones, ur: NEGATIVE mg/dL
Nitrite: NEGATIVE
RBC / HPF: >50 RBC/hpf (ref 0–5)
Specific Gravity, Urine: 1.018 (ref 1.005–1.030)
pH: 5.5 (ref 5.0–8.0)

## 2023-06-08 LAB — COMPREHENSIVE METABOLIC PANEL
ALT: 37 U/L (ref 0–44)
AST: 26 U/L (ref 15–41)
Albumin: 4.1 g/dL (ref 3.5–5.0)
Alkaline Phosphatase: 72 U/L (ref 38–126)
Anion gap: 11 (ref 5–15)
BUN: 18 mg/dL (ref 6–20)
CO2: 24 mmol/L (ref 22–32)
Calcium: 9 mg/dL (ref 8.9–10.3)
Chloride: 107 mmol/L (ref 98–111)
Creatinine, Ser: 0.9 mg/dL (ref 0.44–1.00)
GFR, Estimated: >60 mL/min (ref >60)
Glucose, Bld: 89 mg/dL (ref 70–99)
Potassium: 3.8 mmol/L (ref 3.5–5.1)
Sodium: 142 mmol/L (ref 135–145)
Total Bilirubin: 0.4 mg/dL (ref 0.3–1.2)
Total Protein: 6.6 g/dL (ref 6.5–8.1)

## 2023-06-08 LAB — PREGNANCY, URINE: Preg Test, Ur: NEGATIVE

## 2023-06-08 MED ORDER — OXYCODONE HCL 5 MG PO TABS
5.0000 mg | ORAL_TABLET | ORAL | 0 refills | Status: DC | PRN
Start: 2023-06-08 — End: 2023-11-22

## 2023-06-08 MED ORDER — ONDANSETRON HCL 4 MG/2ML IJ SOLN: 4.0000 mg | Freq: Once | INTRAMUSCULAR | Status: DC

## 2023-06-08 MED ORDER — FENTANYL CITRATE PF 50 MCG/ML IJ SOSY: 100.0000 ug | PREFILLED_SYRINGE | Freq: Once | INTRAMUSCULAR | Status: DC

## 2023-06-08 MED ORDER — IBUPROFEN 800 MG PO TABS
800.0000 mg | ORAL_TABLET | Freq: Once | ORAL | Status: AC
  Administered 2023-06-08: 800 mg via ORAL
  Filled 2023-06-08: qty 1

## 2023-06-08 MED ORDER — IOHEXOL 300 MG/ML  SOLN: 100.0000 mL | Freq: Once | INTRAMUSCULAR | Status: DC | PRN

## 2023-06-08 NOTE — ED Provider Notes (Signed)
  Provider Note MRN:  [id number]  Arrival date & time: 06/08/23    ED Course and Medical Decision Making  Assumed care from Dr. Wilkie Aye upon patient transfer.  Known ovarian cysts with more severe right lower quadrant/pelvic pain sent here for ultrasound imaging.  Ultrasound does not show any evidence of torsion.  No emergent process.  Patient's pain is better controlled, appropriate for discharge.  Procedures  Final Clinical Impressions(s) / ED Diagnoses     ICD-10-CM   1. RLQ abdominal pain  R10.31     2. Cysts of both ovaries  N83.201    N83.202       ED Discharge Orders          Ordered    oxyCODONE (ROXICODONE) 5 MG immediate release tablet  Every 4 hours PRN        06/08/23 0618              Discharge Instructions      You were evaluated in the Emergency Department and after careful evaluation, we did not find any emergent condition requiring admission or further testing in the hospital.  Your exam/testing today was overall reassuring.  Follow-up with your OB/GYN.  Recommend Tylenol 1000 mg every 4-6 hours and/or Motrin 600 mg every 4-6 hours for pain.  You can use the oxycodone medication for more significant pain.  Please return to the Emergency Department if you experience any worsening of your condition.  Thank you for allowing [place] to be a part of your care.     Elmer Sow. Pilar Plate, MD [facility] Emergency Medicine [facility] [email or web address]    Sabas Sous, MD 06/08/23 [phone]

## 2023-06-08 NOTE — ED Notes (Signed)
Patient refusing to be hooked up to monitor at this time, therefore pain meds held since we cannot monitor O2 sats.

## 2023-06-08 NOTE — ED Provider Notes (Signed)
[place] EMERGENCY DEPARTMENT AT [facility] Provider Note   CSN: [id number] Arrival date & time: 06/08/23  0125     History  Chief Complaint  Patient presents with   Abdominal Pain    Rebekah Davis is a 37 y.o. female.  HPI     This is a 37 year old female who presents with right lower quadrant abdominal pain.  Patient reports intermittent history of similar pain in the past.  However over the last 2 days she has had progressively worsening right lower abdomen pain.  She reports history of ovarian cyst and is due to have surgery in September.  She has had some nausea today which is new.  No vomiting.  She had an MRI 2 days ago at [facility].  She was able to show me the results which indicate bilateral ovarian cyst.  Denies dysuria or hematuria.  Denies fever.  She reports that she is abstinent and does not have any concern for pregnancy or STD.  REview of imaging MRI report:  4.1 x 3.9 cm likely endometrioma on R ovary, addition 3.4x2.5 cm likely hemorrhagic cyst L Ovary with 5.7 x 7.1 septated cyst  Home Medications Prior to Admission medications   Medication Sig Start Date End Date Taking? Authorizing Provider  metroNIDAZOLE (FLAGYL) 500 MG tablet Take 1 tablet (500 mg total) by mouth 2 (two) times daily. 04/18/23   LampteyBritta Mccreedy, MD  Acetaminophen (TYLENOL PO) Take by mouth.    [provider]  albuterol (VENTOLIN HFA) 108 (90 Base) MCG/ACT inhaler Inhale 2 puffs into the lungs as needed for wheezing or shortness of breath. 08/03/22   Dulce Sellar, NP  ARIPiprazole (ABILIFY) 10 MG tablet Take 10 mg by mouth daily. 10/29/22   [provider]  escitalopram (LEXAPRO) 10 MG tablet TAKE 1 TABLET(10 MG) BY MOUTH DAILY Patient taking differently: Take 10 mg by mouth daily. 06/29/22   Dulce Sellar, NP  escitalopram (LEXAPRO) 5 MG tablet Take 5 mg by mouth daily.    [provider]  HYDROcodone-acetaminophen (NORCO/VICODIN) 5-325 MG tablet  Take 1 tablet by mouth every 6 (six) hours as needed. 05/16/23   Terrilee Files, MD  hydrOXYzine (ATARAX) 10 MG tablet SMARTSIG:1 Tablet(s) By Mouth 1-3 Times Daily PRN 02/03/23   [provider]  hydrOXYzine (ATARAX) 25 MG tablet Take 1 tablet (25 mg total) by mouth 3 (three) times daily as needed for anxiety. 11/21/22   Lenard Lance, FNP  IBUPROFEN PO Take by mouth.    [provider]  LORazepam (ATIVAN) 0.5 MG tablet Take 0.25-0.5 mg by mouth every 4 (four) hours as needed. 02/18/23   [provider]  meloxicam (MOBIC) 15 MG tablet Take 1 tablet (15 mg total) by mouth daily. 04/16/23   Wallis Bamberg, PA-C  omeprazole (PRILOSEC) 20 MG capsule Take 1 capsule (20 mg total) by mouth daily. 04/16/23   Wallis Bamberg, PA-C      Allergies    Adhesive [tape], Cherry, Sulfamethoxazole-trimethoprim, Levofloxacin, Morphine, Gabapentin, Metformin, and Sumatriptan    Review of Systems   Review of Systems  Constitutional:  Negative for fever.  Gastrointestinal:  Positive for abdominal pain and nausea.  Genitourinary:  Negative for dysuria.  All other systems reviewed and are negative.   Physical Exam Updated Vital Signs BP (!) 168/91 (BP Location: Left Wrist)   Pulse 96   Temp 97.7 F (36.5 C) (Oral)   Resp 20   Wt (!) 157.4 kg   LMP 06/04/2023 (Exact Date)  Comment: not sexually active  SpO2 100%   BMI 57.74 kg/m  Physical Exam Vitals and nursing note reviewed.  Constitutional:      Appearance: She is well-developed. She is obese.  HENT:     Head: Normocephalic and atraumatic.  Eyes:     Pupils: Pupils are equal, round, and reactive to light.  Cardiovascular:     Rate and Rhythm: Normal rate and regular rhythm.     Heart sounds: Normal heart sounds.  Pulmonary:     Effort: Pulmonary effort is normal. No respiratory distress.     Breath sounds: No wheezing.  Abdominal:     General: Bowel sounds are normal.     Palpations: Abdomen is soft.     Tenderness: There is  abdominal tenderness in the right lower quadrant. There is no guarding or rebound.  Musculoskeletal:     Cervical back: Neck supple.  Skin:    General: Skin is warm and dry.  Neurological:     Mental Status: She is alert and oriented to person, place, and time.  Psychiatric:        Mood and Affect: Mood normal.     ED Results / Procedures / Treatments   Labs (all labs ordered are listed, but only abnormal results are displayed) Labs Reviewed  URINALYSIS, ROUTINE W REFLEX MICROSCOPIC - Abnormal; Notable for the following components:      Result Value   Color, Urine ORANGE (*)    APPearance HAZY (*)    Hgb urine dipstick LARGE (*)    Protein, ur TRACE (*)    Leukocytes,Ua TRACE (*)    All other components within normal limits  CBC WITH DIFFERENTIAL/PLATELET  COMPREHENSIVE METABOLIC PANEL  PREGNANCY, URINE    EKG None  Radiology No results found.  Procedures Procedures    Medications Ordered in ED Medications  ondansetron (ZOFRAN) injection 4 mg (has no administration in time range)  fentaNYL (SUBLIMAZE) injection 100 mcg (has no administration in time range)  iohexol (OMNIPAQUE) 300 MG/ML solution 100 mL (has no administration in time range)    ED Course/ Medical Decision Making/ A&P                                 Medical Decision Making Amount and/or Complexity of Data Reviewed Labs: ordered. Radiology: ordered.  Risk Prescription drug management.   This patient presents to the ED for concern of right lower quadrant pain, this involves an extensive number of treatment options, and is a complaint that carries with it a high risk of complications and morbidity.  I considered the following differential and admission for this acute, potentially life threatening condition.  The differential diagnosis includes ovarian cyst, ovarian torsion, appendicitis, kidney stone, UTI  MDM:    This is a 37 year old female who presents with right lower quadrant abdominal  pain.  She is nontoxic and vital signs are reassuring.  She has tenderness in the abdomen without signs of peritonitis although this is limited secondary to body habitus.  I have reviewed her imaging recently which shows bilateral ovarian cyst.  She certainly would be at risk for torsion.  Labs obtained.  Patient declined pain medication.  Labs are largely reassuring.  Discussed utility of ultrasound imaging to rule out torsion as this would be the emergent process that I would be most concerned about given otherwise recent imaging on MRI that was reassuring.  Will transfer to [facility]  for ultrasound imaging.  (Labs, imaging, consults)  Labs: I Ordered, and personally interpreted labs.  The pertinent results include: BC, CMP, urinalysis, urine pregnancy  Imaging Studies ordered: I ordered imaging studies including ultrasound pending I independently visualized and interpreted imaging. I agree with the radiologist interpretation  Additional history obtained from chart review.  External records from outside source obtained and reviewed including an MRI  Cardiac Monitoring: The patient was not maintained on a cardiac monitor.  If on the cardiac monitor, I personally viewed and interpreted the cardiac monitored which showed an underlying rhythm of: N/A  Reevaluation: After the interventions noted above, I reevaluated the patient and found that they have :improved  Social Determinants of Health:  lives independently  Disposition: Transfer for ultrasound imaging.  Dr. Pilar Plate accepting  Co morbidities that complicate the patient evaluation  Past Medical History:  Diagnosis Date   Abdominal pain, suprapubic 06/04/2016   Abnormal Pap smear of cervix    Acute cystitis 03/10/2018   Anxiety    Arthritis    C. difficile diarrhea 06/12/2018   Cataract    Chest pain 03/28/2018   Depression    Depression with suicidal ideation 03/09/2018   Diarrhea 06/13/2018   GERD (gastroesophageal reflux  disease)    History of kidney stones    Hypomagnesemia 06/13/2018   Low back pain 07/22/2012   Migraine    Non-suicidal self harm as coping mechanism (HCC) 12/25/2019   Oral thrush 06/12/2018   Ovarian cyst    PCOS (polycystic ovarian syndrome)    PTSD (post-traumatic stress disorder)    Scarlet fever    Sleep apnea    Substance abuse (HCC)    Suicidal ideation 04/12/2018     Medicines Meds ordered this encounter  Medications   ondansetron (ZOFRAN) injection 4 mg   fentaNYL (SUBLIMAZE) injection 100 mcg   iohexol (OMNIPAQUE) 300 MG/ML solution 100 mL    I have reviewed the patients home medicines and have made adjustments as needed  Problem List / ED Course: Problem List Items Addressed This Visit       Endocrine   Ovarian cyst   Other Visit Diagnoses     RLQ abdominal pain    -  Primary                   Final Clinical Impression(s) / ED Diagnoses Final diagnoses:  RLQ abdominal pain  Cysts of both ovaries    Rx / DC Orders ED Discharge Orders     None         Shon Baton, MD 06/08/23 [phone]

## 2023-06-08 NOTE — ED Notes (Signed)
EDP at bedside  

## 2023-06-08 NOTE — ED Notes (Signed)
Report called to Tresa Endo, Consulting civil engineer at [facility] for POV transfer. Pt needs [place], and [facility] ED does not have [place] during these hours.

## 2023-06-08 NOTE — ED Notes (Signed)
I went to let patient know about CT. Patient became very anxious about CT, especially the IV contrast; asked if it would interfere with the contrast received for MRI at [facility] on Thurs. I asked Dr. Wilkie Aye, who said that if she had MRI she would not need CT; pt MRI results shared with Dr. Wilkie Aye, who discontinued CT.

## 2023-06-08 NOTE — Discharge Instructions (Signed)
You were evaluated in the Emergency Department and after careful evaluation, we did not find any emergent condition requiring admission or further testing in the hospital.  Your exam/testing today was overall reassuring.  Follow-up with your OB/GYN.  Recommend Tylenol 1000 mg every 4-6 hours and/or Motrin 600 mg every 4-6 hours for pain.  You can use the oxycodone medication for more significant pain.  Please return to the Emergency Department if you experience any worsening of your condition.  Thank you for allowing [place] to be a part of your care.

## 2023-06-08 NOTE — ED Notes (Signed)
Pt began to have severe anxiety when I told her about the ordered IV medications; she says she is concerned about allergic reactions; I attempted to explain to patient the medications and tried to relieve anxiety, but patient began tearing up; I asked if her pain was tolerable, which she said it was, and suggested we put a hold on any IV medications for now; pt agreeable; EDP made aware

## 2023-06-08 NOTE — ED Notes (Signed)
Pt refused to change into gown and be hooked to monitor. Pt states she has anxiety in hospitals.

## 2023-06-08 NOTE — ED Triage Notes (Addendum)
Pt in with pain to mid-lower R abdomen x 2 days. Pt states hx of ovarian cyst, +n/v presently, currently on menses

## 2023-06-29 ENCOUNTER — Emergency Department (HOSPITAL_BASED_OUTPATIENT_CLINIC_OR_DEPARTMENT_OTHER)
Admission: EM | Admit: 2023-06-29 | Discharge: 2023-06-29 | Disposition: A | Discharge status: Home / Self Care | Payer: Managed Care, Other (non HMO) | Attending: Emergency Medicine | Admitting: Emergency Medicine

## 2023-06-29 ENCOUNTER — Encounter (HOSPITAL_BASED_OUTPATIENT_CLINIC_OR_DEPARTMENT_OTHER): Payer: Self-pay | Admitting: Emergency Medicine

## 2023-06-29 ENCOUNTER — Other Ambulatory Visit: Payer: Self-pay

## 2023-06-29 DIAGNOSIS — R3589 Other polyuria: Secondary | ICD-10-CM | POA: Insufficient documentation

## 2023-06-29 DIAGNOSIS — U071 COVID-19: Secondary | ICD-10-CM | POA: Insufficient documentation

## 2023-06-29 DIAGNOSIS — R509 Fever, unspecified: Secondary | ICD-10-CM | POA: Diagnosis present

## 2023-06-29 DIAGNOSIS — R197 Diarrhea, unspecified: Secondary | ICD-10-CM | POA: Insufficient documentation

## 2023-06-29 LAB — CBC WITH DIFFERENTIAL/PLATELET
Abs Immature Granulocytes: 0.03 10*3/uL (ref 0.00–0.07)
Basophils Absolute: 0.1 10*3/uL (ref 0.0–0.1)
Basophils Relative: 1 %
Eosinophils Absolute: 0.2 10*3/uL (ref 0.0–0.5)
Eosinophils Relative: 2 %
HCT: 43.1 % (ref 36.0–46.0)
Hemoglobin: 15 g/dL (ref 12.0–15.0)
Immature Granulocytes: 0 %
Lymphocytes Relative: 28 %
Lymphs Abs: 2.7 10*3/uL (ref 0.7–4.0)
MCH: 31.4 pg (ref 26.0–34.0)
MCHC: 34.8 g/dL (ref 30.0–36.0)
MCV: 90.2 fL (ref 80.0–100.0)
Monocytes Absolute: 0.5 10*3/uL (ref 0.1–1.0)
Monocytes Relative: 5 %
Neutro Abs: 6.2 10*3/uL (ref 1.7–7.7)
Neutrophils Relative %: 64 %
Platelets: 257 10*3/uL (ref 150–400)
RBC: 4.78 MIL/uL (ref 3.87–5.11)
RDW: 12.5 % (ref 11.5–15.5)
WBC: 9.6 10*3/uL (ref 4.0–10.5)
nRBC: 0 % (ref 0.0–0.2)

## 2023-06-29 LAB — COMPREHENSIVE METABOLIC PANEL
ALT: 38 U/L (ref 0–44)
AST: 23 U/L (ref 15–41)
Albumin: 4.1 g/dL (ref 3.5–5.0)
Alkaline Phosphatase: 75 U/L (ref 38–126)
Anion gap: 9 (ref 5–15)
BUN: 13 mg/dL (ref 6–20)
CO2: 26 mmol/L (ref 22–32)
Calcium: 9 mg/dL (ref 8.9–10.3)
Chloride: 103 mmol/L (ref 98–111)
Creatinine, Ser: 0.85 mg/dL (ref 0.44–1.00)
GFR, Estimated: >60 mL/min (ref >60)
Glucose, Bld: 114 mg/dL — ABNORMAL HIGH (ref 70–99)
Potassium: 3.8 mmol/L (ref 3.5–5.1)
Sodium: 138 mmol/L (ref 135–145)
Total Bilirubin: 0.4 mg/dL (ref 0.3–1.2)
Total Protein: 7 g/dL (ref 6.5–8.1)

## 2023-06-29 LAB — CBG MONITORING, ED: Glucose-Capillary: 118 mg/dL — ABNORMAL HIGH (ref 70–99)

## 2023-06-29 LAB — URINALYSIS, ROUTINE W REFLEX MICROSCOPIC
Bilirubin Urine: NEGATIVE
Glucose, UA: NEGATIVE mg/dL
Hgb urine dipstick: NEGATIVE
Ketones, ur: NEGATIVE mg/dL
Leukocytes,Ua: NEGATIVE
Nitrite: NEGATIVE
Protein, ur: NEGATIVE mg/dL
Specific Gravity, Urine: 1.012 (ref 1.005–1.030)
pH: 6.5 (ref 5.0–8.0)

## 2023-06-29 MED ORDER — LACTATED RINGERS IV BOLUS
1000.0000 mL | Freq: Once | INTRAVENOUS | Status: AC
  Administered 2023-06-29: 1000 mL via INTRAVENOUS

## 2023-06-29 MED ORDER — LORAZEPAM 1 MG PO TABS: 0.5000 mg | ORAL_TABLET | ORAL | Status: DC | PRN

## 2023-06-29 NOTE — ED Provider Notes (Signed)
[place] EMERGENCY DEPARTMENT AT [facility] Provider Note   CSN: [id number] Arrival date & time: 06/29/23  0103     History  Chief Complaint  Patient presents with   Dehydration    Rebekah Davis is a 37 y.o. female.  37 year old female presents ER today secondary to decreased p.o. intake and the feeling of being dehydrated.  Patient states that she had COVID recently and also had a couple teeth pulled.  So she has had decreased appetite for both those reasons.  She states she still drinking fluids but not as much as normal.  She states she has been urinating a lot but has a dry mouth and just feels overall weak and tired.  No fevers currently.  No trouble swallowing.  No trouble breathing.  No other associated symptoms.        Home Medications Prior to Admission medications   Medication Sig Start Date End Date Taking? Authorizing Provider  metroNIDAZOLE (FLAGYL) 500 MG tablet Take 1 tablet (500 mg total) by mouth 2 (two) times daily. 04/18/23   LampteyBritta Mccreedy, MD  Acetaminophen (TYLENOL PO) Take by mouth.    [provider]  albuterol (VENTOLIN HFA) 108 (90 Base) MCG/ACT inhaler Inhale 2 puffs into the lungs as needed for wheezing or shortness of breath. 08/03/22   Dulce Sellar, NP  ARIPiprazole (ABILIFY) 10 MG tablet Take 10 mg by mouth daily. 10/29/22   [provider]  escitalopram (LEXAPRO) 10 MG tablet TAKE 1 TABLET(10 MG) BY MOUTH DAILY Patient taking differently: Take 10 mg by mouth daily. 06/29/22   Dulce Sellar, NP  escitalopram (LEXAPRO) 5 MG tablet Take 5 mg by mouth daily.    [provider]  HYDROcodone-acetaminophen (NORCO/VICODIN) 5-325 MG tablet Take 1 tablet by mouth every 6 (six) hours as needed. 05/16/23   Terrilee Files, MD  hydrOXYzine (ATARAX) 10 MG tablet SMARTSIG:1 Tablet(s) By Mouth 1-3 Times Daily PRN 02/03/23   [provider]  hydrOXYzine (ATARAX) 25 MG tablet Take 1 tablet (25 mg total) by  mouth 3 (three) times daily as needed for anxiety. 11/21/22   Lenard Lance, FNP  IBUPROFEN PO Take by mouth.    [provider]  LORazepam (ATIVAN) 0.5 MG tablet Take 0.25-0.5 mg by mouth every 4 (four) hours as needed. 02/18/23   [provider]  meloxicam (MOBIC) 15 MG tablet Take 1 tablet (15 mg total) by mouth daily. 04/16/23   Wallis Bamberg, PA-C  omeprazole (PRILOSEC) 20 MG capsule Take 1 capsule (20 mg total) by mouth daily. 04/16/23   Wallis Bamberg, PA-C  oxyCODONE (ROXICODONE) 5 MG immediate release tablet Take 1 tablet (5 mg total) by mouth every 4 (four) hours as needed for severe pain. 06/08/23   Sabas Sous, MD      Allergies    Adhesive [tape], Cherry, Sulfamethoxazole-trimethoprim, Levofloxacin, Morphine, Gabapentin, Metformin, and Sumatriptan    Review of Systems   Review of Systems  Physical Exam Updated Vital Signs BP (!) 146/94   Pulse 94   Temp 97.7 F (36.5 C) (Temporal)   Resp 17   LMP 06/04/2023 (Exact Date) Comment: not sexually active  SpO2 97%  Physical Exam Vitals and nursing note reviewed.  Constitutional:      Appearance: She is well-developed.  HENT:     Head: Normocephalic and atraumatic.     Comments: No obvious abscess or infection.  She has normal healing granulation tissue where her teeth were.   Cardiovascular:  Rate and Rhythm: Normal rate and regular rhythm.  Pulmonary:     Effort: No respiratory distress.     Breath sounds: No stridor.  Abdominal:     General: There is no distension.  Musculoskeletal:     Cervical back: Normal range of motion.  Skin:    General: Skin is warm and dry.  Neurological:     General: No focal deficit present.     Mental Status: She is alert.     ED Results / Procedures / Treatments   Labs (all labs ordered are listed, but only abnormal results are displayed) Labs Reviewed  COMPREHENSIVE METABOLIC PANEL - Abnormal; Notable for the following components:      Result Value   Glucose, Bld  114 (*)    All other components within normal limits  URINALYSIS, ROUTINE W REFLEX MICROSCOPIC - Abnormal; Notable for the following components:   Color, Urine COLORLESS (*)    All other components within normal limits  CBG MONITORING, ED - Abnormal; Notable for the following components:   Glucose-Capillary 118 (*)    All other components within normal limits  CBC WITH DIFFERENTIAL/PLATELET    EKG None  Radiology No results found.  Procedures Procedures    Medications Ordered in ED Medications  LORazepam (ATIVAN) tablet 0.5 mg (has no administration in time range)  lactated ringers bolus 1,000 mL (1,000 mLs Intravenous New Bag/Given 06/29/23 0514)    ED Course/ Medical Decision Making/ A&P                                 Medical Decision Making Amount and/or Complexity of Data Reviewed Labs: ordered.  Risk Prescription drug management.   No obvious abscess or infection.  She has normal healing granulation tissue where her teeth were.  No other signs of significant bacterial infection.  With the polyuria and still drinking fluids with the recent infection checked her glucose to make sure she have diabetes and this was mildly elevated which is consistent with her drinking Gatorade.  No evidence of significant hydration with clear urine and normal kidneys. Feeling better. Urinating well. Suspect she just feels unwell from the covid rather than something more serious. Stable for d/c.   Final Clinical Impression(s) / ED Diagnoses Final diagnoses:  Diarrhea, unspecified type  COVID  Polyuria    Rx / DC Orders ED Discharge Orders     None         Lewie Deman, Barbara Cower, MD 06/29/23 [phone]

## 2023-06-29 NOTE — ED Triage Notes (Signed)
Pt states she feels she is dehydrated.  She had teeth extracted on Tuesday and then was diagnosed with covid yesterday  States she has not had any n/v/d or fever/chills however she feels she is not taking in enough fluids.

## 2023-07-17 ENCOUNTER — Encounter (HOSPITAL_BASED_OUTPATIENT_CLINIC_OR_DEPARTMENT_OTHER): Payer: Self-pay | Admitting: Emergency Medicine

## 2023-07-17 ENCOUNTER — Other Ambulatory Visit: Payer: Self-pay

## 2023-07-17 ENCOUNTER — Emergency Department (HOSPITAL_BASED_OUTPATIENT_CLINIC_OR_DEPARTMENT_OTHER): Payer: Managed Care, Other (non HMO)

## 2023-07-17 ENCOUNTER — Emergency Department (HOSPITAL_BASED_OUTPATIENT_CLINIC_OR_DEPARTMENT_OTHER)
Admission: EM | Admit: 2023-07-17 | Discharge: 2023-07-17 | Disposition: A | Discharge status: Home / Self Care | Payer: Managed Care, Other (non HMO) | Attending: Emergency Medicine | Admitting: Emergency Medicine

## 2023-07-17 DIAGNOSIS — R1032 Left lower quadrant pain: Secondary | ICD-10-CM | POA: Diagnosis present

## 2023-07-17 DIAGNOSIS — N809 Endometriosis, unspecified: Secondary | ICD-10-CM | POA: Diagnosis not present

## 2023-07-17 DIAGNOSIS — R102 Pelvic and perineal pain: Secondary | ICD-10-CM

## 2023-07-17 LAB — CBC
HCT: 42.2 % (ref 36.0–46.0)
Hemoglobin: 14.5 g/dL (ref 12.0–15.0)
MCH: 31.4 pg (ref 26.0–34.0)
MCHC: 34.4 g/dL (ref 30.0–36.0)
MCV: 91.3 fL (ref 80.0–100.0)
Platelets: 231 10*3/uL (ref 150–400)
RBC: 4.62 MIL/uL (ref 3.87–5.11)
RDW: 12.7 % (ref 11.5–15.5)
WBC: 9.2 10*3/uL (ref 4.0–10.5)
nRBC: 0 % (ref 0.0–0.2)

## 2023-07-17 LAB — URINALYSIS, ROUTINE W REFLEX MICROSCOPIC
Bacteria, UA: NONE SEEN
Bilirubin Urine: NEGATIVE
Glucose, UA: NEGATIVE mg/dL
Ketones, ur: NEGATIVE mg/dL
Leukocytes,Ua: NEGATIVE
Nitrite: NEGATIVE
Protein, ur: NEGATIVE mg/dL
Specific Gravity, Urine: 1.013 (ref 1.005–1.030)
pH: 5.5 (ref 5.0–8.0)

## 2023-07-17 LAB — PREGNANCY, URINE: Preg Test, Ur: NEGATIVE

## 2023-07-17 NOTE — Discharge Instructions (Addendum)
Please call your gynecologist for recheck.  You continue to have cysts but no other acute abnormality noted on your ultrasound.  Return if you are having worsening symptoms that are not controlled with over-the-counter pain medications.

## 2023-07-17 NOTE — ED Notes (Signed)
Pt refused to have BP checked again. Stated she was claustrophobic.

## 2023-07-17 NOTE — ED Triage Notes (Signed)
Pt has known cysts on her ovaries that are due to be removed in November. She has been given muscle relaxer with no relief. Has been taking tylenol and ibuprofen, but still in pain. Wanting something for pain and to make sure nothing else is wrong.

## 2023-07-17 NOTE — ED Provider Notes (Signed)
[place] EMERGENCY DEPARTMENT AT [facility] Provider Note   CSN: [id number] Arrival date & time: 07/17/23  4098     History  Chief Complaint  Patient presents with  . Pelvic Pain    Rebekah Davis is a 37 y.o. female.  HPI 37 year old female history of ovarian cysts scheduled for surgery for endometriosis in November presents today complaining of suprapubic and left lower quadrant pain.  This is somewhat like her pain in the past but she feels like the central pain is radiating through more to her back.  She states the pain usually comes with her menstrual cycle but is not with her menses at this time.  Her last menstrual period was approximately 2 weeks ago states her periods are regular.  She is not sexually active and denies any abnormal vaginal discharge.  She denies pregnancy.    Home Medications Prior to Admission medications   Medication Sig Start Date End Date Taking? Authorizing Provider  amoxicillin (AMOXIL) 875 MG tablet Take 875 mg by mouth 2 (two) times daily. 07/15/23  Yes [provider]  chlorhexidine (PERIDEX) 0.12 % solution SMARTSIG:By Mouth 07/02/23  Yes [provider]  ergocalciferol (VITAMIN D2) 1.25 MG (50000 UT) capsule Take 50,000 Units by mouth once a week. 06/06/23 06/05/24 Yes [provider]  metroNIDAZOLE (FLAGYL) 500 MG tablet Take 1 tablet (500 mg total) by mouth 2 (two) times daily. 04/18/23   LampteyBritta Mccreedy, MD  Acetaminophen (TYLENOL PO) Take by mouth.    [provider]  albuterol (VENTOLIN HFA) 108 (90 Base) MCG/ACT inhaler Inhale 2 puffs into the lungs as needed for wheezing or shortness of breath. 08/03/22   Dulce Sellar, NP  ARIPiprazole (ABILIFY) 10 MG tablet Take 10 mg by mouth daily. 10/29/22   [provider]  escitalopram (LEXAPRO) 10 MG tablet TAKE 1 TABLET(10 MG) BY MOUTH DAILY Patient taking differently: Take 10 mg by mouth daily. 06/29/22   Dulce Sellar, NP  escitalopram  (LEXAPRO) 5 MG tablet Take 5 mg by mouth daily.    [provider]  HYDROcodone-acetaminophen (NORCO/VICODIN) 5-325 MG tablet Take 1 tablet by mouth every 6 (six) hours as needed. 05/16/23   Terrilee Files, MD  hydrOXYzine (ATARAX) 10 MG tablet SMARTSIG:1 Tablet(s) By Mouth 1-3 Times Daily PRN 02/03/23   [provider]  hydrOXYzine (ATARAX) 25 MG tablet Take 1 tablet (25 mg total) by mouth 3 (three) times daily as needed for anxiety. 11/21/22   Lenard Lance, FNP  IBUPROFEN PO Take by mouth.    [provider]  LORazepam (ATIVAN) 0.5 MG tablet Take 0.25-0.5 mg by mouth every 4 (four) hours as needed. 02/18/23   [provider]  meloxicam (MOBIC) 15 MG tablet Take 1 tablet (15 mg total) by mouth daily. 04/16/23   Wallis Bamberg, PA-C  omeprazole (PRILOSEC) 20 MG capsule Take 1 capsule (20 mg total) by mouth daily. 04/16/23   Wallis Bamberg, PA-C  oxyCODONE (ROXICODONE) 5 MG immediate release tablet Take 1 tablet (5 mg total) by mouth every 4 (four) hours as needed for severe pain. 06/08/23   Sabas Sous, MD      Allergies    Adhesive [tape], Cherry, Sulfamethoxazole-trimethoprim, Levofloxacin, Morphine, Gabapentin, Metformin, and Sumatriptan    Review of Systems   Review of Systems  Physical Exam Updated Vital Signs BP (!) 141/69   Pulse (!) 116   Temp 99.2 F (37.3 C) (Oral)   Resp 16   SpO2 100%  Physical  Exam Vitals reviewed.  Constitutional:      Appearance: She is obese.  HENT:     Head: Normocephalic.     Right Ear: External ear normal.     Left Ear: External ear normal.     Mouth/Throat:     Pharynx: Oropharynx is clear.  Eyes:     Conjunctiva/sclera: Conjunctivae normal.  Cardiovascular:     Rate and Rhythm: Normal rate and regular rhythm.     Pulses: Normal pulses.  Pulmonary:     Effort: Pulmonary effort is normal.     Breath sounds: Normal breath sounds.  Abdominal:     General: Abdomen is flat. Bowel sounds are normal.     Comments:  Some suprapubic left lower quadrant tenderness palpation  Genitourinary:    Comments: No obvious external signs of trauma Musculoskeletal:        General: Normal range of motion.     Cervical back: Normal range of motion.  Skin:    General: Skin is warm and dry.     Capillary Refill: Capillary refill takes less than 2 seconds.  Neurological:     General: No focal deficit present.     Mental Status: She is alert.  Psychiatric:        Mood and Affect: Mood normal.        Behavior: Behavior normal.     ED Results / Procedures / Treatments   Labs (all labs ordered are listed, but only abnormal results are displayed) Labs Reviewed  URINALYSIS, ROUTINE W REFLEX MICROSCOPIC - Abnormal; Notable for the following components:      Result Value   Hgb urine dipstick SMALL (*)    All other components within normal limits  PREGNANCY, URINE  CBC    EKG None  Radiology No results found.  Procedures Procedures    Medications Ordered in ED Medications - No data to display  ED Course/ Medical Decision Making/ A&P Clinical Course as of 07/22/23 1149  Wed Jul 17, 2023  1045 This pelvic ultrasound reviewed shows 5.2 cm anechoic structure within the right ovary likely corresponding to the hypoechoic lesion prior ultrasound and a 6.9 cm anechoic structure within the left ovary favored to represent a simple ovarian cyst [DR]  1046  with follow-up recommended [DR]    Clinical Course User Index [DR] Margarita Grizzle, MD                                 Medical Decision Making Amount and/or Complexity of Data Reviewed Labs: ordered. Radiology: ordered.            Final Clinical Impression(s) / ED Diagnoses Final diagnoses:  Pelvic pain  Endometriosis    Rx / DC Orders ED Discharge Orders     None         Margarita Grizzle, MD 07/22/23 1149

## 2023-07-17 NOTE — ED Notes (Signed)
Pt given discharge instructions. Opportunities given for questions. Pt verbalizes understanding. Stone,Heather R, RN 

## 2023-10-15 ENCOUNTER — Emergency Department (HOSPITAL_BASED_OUTPATIENT_CLINIC_OR_DEPARTMENT_OTHER)
Admission: EM | Admit: 2023-10-15 | Discharge: 2023-10-15 | Disposition: A | Discharge status: Home / Self Care | Payer: Managed Care, Other (non HMO) | Attending: Emergency Medicine | Admitting: Emergency Medicine

## 2023-10-15 ENCOUNTER — Other Ambulatory Visit: Payer: Self-pay

## 2023-10-15 ENCOUNTER — Encounter (HOSPITAL_BASED_OUTPATIENT_CLINIC_OR_DEPARTMENT_OTHER): Payer: Self-pay | Admitting: *Deleted

## 2023-10-15 DIAGNOSIS — J069 Acute upper respiratory infection, unspecified: Secondary | ICD-10-CM | POA: Diagnosis not present

## 2023-10-15 DIAGNOSIS — Z20822 Contact with and (suspected) exposure to covid-19: Secondary | ICD-10-CM | POA: Diagnosis not present

## 2023-10-15 DIAGNOSIS — R059 Cough, unspecified: Secondary | ICD-10-CM | POA: Diagnosis present

## 2023-10-15 LAB — RESP PANEL BY RT-PCR (RSV, FLU A&B, COVID)  RVPGX2
Influenza A by PCR: NEGATIVE
Influenza B by PCR: NEGATIVE
Resp Syncytial Virus by PCR: NEGATIVE
SARS Coronavirus 2 by RT PCR: NEGATIVE

## 2023-10-15 NOTE — ED Triage Notes (Signed)
 Pt stating cough, body aches, and congestion x 4 days. Denies fevers.

## 2023-10-15 NOTE — ED Provider Notes (Signed)
 [place] EMERGENCY DEPARTMENT AT [facility] Provider Note   CSN: [id number] Arrival date & time: 10/15/23  9590     History  Chief Complaint  Patient presents with   Nasal Congestion    Rebekah Davis is a 37 y.o. female who presents emergency department chief complaint of URI symptoms.  Patient reports cough, nasal congestion, ear fullness, body aches and chills over the past 5 days.  Patient has not taken anything but vitamin C and ibuprofen  because I was not sure what to take.  She denies fevers, shortness of breath, wheezing, nausea or vomiting.  HPI     Home Medications Prior to Admission medications   Medication Sig Start Date End Date Taking? Authorizing Provider  metroNIDAZOLE  (FLAGYL ) 500 MG tablet Take 1 tablet (500 mg total) by mouth 2 (two) times daily. 04/18/23   Blaise Aleene KIDD, MD  Acetaminophen  (TYLENOL  PO) Take by mouth.    [provider]  albuterol  (VENTOLIN  HFA) 108 (90 Base) MCG/ACT inhaler Inhale 2 puffs into the lungs as needed for wheezing or shortness of breath. 08/03/22   Lucius Krabbe, NP  amoxicillin (AMOXIL) 875 MG tablet Take 875 mg by mouth 2 (two) times daily. 07/15/23   [provider]  ARIPiprazole  (ABILIFY ) 10 MG tablet Take 10 mg by mouth daily. 10/29/22   [provider]  chlorhexidine  (PERIDEX ) 0.12 % solution SMARTSIG:By Mouth 07/02/23   [provider]  ergocalciferol  (VITAMIN D2) 1.25 MG (50000 UT) capsule Take 50,000 Units by mouth once a week. 06/06/23 06/05/24  [provider]  escitalopram  (LEXAPRO ) 10 MG tablet TAKE 1 TABLET(10 MG) BY MOUTH DAILY Patient taking differently: Take 10 mg by mouth daily. 06/29/22   Lucius Krabbe, NP  escitalopram  (LEXAPRO ) 5 MG tablet Take 5 mg by mouth daily.    [provider]  HYDROcodone -acetaminophen  (NORCO/VICODIN) 5-325 MG tablet Take 1 tablet by mouth every 6 (six) hours as needed. 05/16/23   Towana Ozell BROCKS, MD  hydrOXYzine   (ATARAX ) 10 MG tablet SMARTSIG:1 Tablet(s) By Mouth 1-3 Times Daily PRN 02/03/23   [provider]  hydrOXYzine  (ATARAX ) 25 MG tablet Take 1 tablet (25 mg total) by mouth 3 (three) times daily as needed for anxiety. 11/21/22   Dasie Ellouise CROME, FNP  IBUPROFEN  PO Take by mouth.    [provider]  LORazepam  (ATIVAN ) 0.5 MG tablet Take 0.25-0.5 mg by mouth every 4 (four) hours as needed. 02/18/23   [provider]  meloxicam  (MOBIC ) 15 MG tablet Take 1 tablet (15 mg total) by mouth daily. 04/16/23   Christopher Savannah, PA-C  omeprazole  (PRILOSEC) 20 MG capsule Take 1 capsule (20 mg total) by mouth daily. 04/16/23   Christopher Savannah, PA-C  oxyCODONE  (ROXICODONE ) 5 MG immediate release tablet Take 1 tablet (5 mg total) by mouth every 4 (four) hours as needed for severe pain. 06/08/23   Theadore Ozell HERO, MD      Allergies    Adhesive [tape], Cherry, Sulfamethoxazole-trimethoprim, Levofloxacin, Morphine, Gabapentin, Metformin, and Sumatriptan    Review of Systems   Review of Systems  Physical Exam Updated Vital Signs BP 125/81 (BP Location: Right Arm)   Pulse (!) 105   Temp 98.4 F (36.9 C)   Resp 20   Ht 5' 5 (1.651 m)   Wt (!) 156.5 kg   SpO2 98%   BMI 57.41 kg/m  Physical Exam Vitals and nursing note reviewed.  Constitutional:      General: She is not in acute distress.  Appearance: She is well-developed. She is not diaphoretic.  HENT:     Head: Normocephalic and atraumatic.     Right Ear: Tympanic membrane and external ear normal.     Left Ear: Tympanic membrane and external ear normal.     Nose: Nose normal.     Mouth/Throat:     Mouth: Mucous membranes are moist.  Eyes:     General: No scleral icterus.    Conjunctiva/sclera: Conjunctivae normal.  Cardiovascular:     Rate and Rhythm: Normal rate and regular rhythm.     Heart sounds: Normal heart sounds. No murmur heard.    No friction rub. No gallop.  Pulmonary:     Effort: Pulmonary effort is normal. No respiratory  distress.     Breath sounds: Normal breath sounds.  Abdominal:     General: Bowel sounds are normal. There is no distension.     Palpations: Abdomen is soft. There is no mass.     Tenderness: There is no abdominal tenderness. There is no guarding.  Musculoskeletal:     Cervical back: Normal range of motion.  Skin:    General: Skin is warm and dry.  Neurological:     Mental Status: She is alert and oriented to person, place, and time.  Psychiatric:        Behavior: Behavior normal.     ED Results / Procedures / Treatments   Labs (all labs ordered are listed, but only abnormal results are displayed) Labs Reviewed  RESP PANEL BY RT-PCR (RSV, FLU A&B, COVID)  [id number]    EKG None  Radiology No results found.  Procedures Procedures    Medications Ordered in ED Medications - No data to display  ED Course/ Medical Decision Making/ A&P                                 Medical Decision Making  Pt aspiratory panel is negative for acute finding.  Patients symptoms are consistent with URI, likely viral etiology. Discussed that antibiotics are not indicated for viral infections. Pt will be discharged with symptomatic treatment.  Verbalizes understanding and is agreeable with plan. Pt is hemodynamically stable & in NAD prior to dc.         Final Clinical Impression(s) / ED Diagnoses Final diagnoses:  Upper respiratory tract infection, unspecified type    Rx / DC Orders ED Discharge Orders     None         Arloa Chroman, PA-C 10/15/23 9043    Jerrol Agent, MD 10/15/23 1413

## 2023-10-15 NOTE — Discharge Instructions (Signed)
 Take DayQuil/ NyQuil ( I like the liquicaps because it's easy to take 1/2 the dose if the full dose is too strong) continue taking ibuprofen  or naproxen  for pain.  Contact a health care provider if: You are getting worse instead of better. You have a fever or chills. Your mucus is brown or red. You have yellow or brown discharge coming from your nose. You have pain in your face, especially when you bend forward. You have swollen neck glands. You have pain while swallowing. You have white areas in the back of your throat. Get help right away if: You have shortness of breath that gets worse. You have severe or persistent: Headache. Ear pain. Sinus pain. Chest pain. You have chronic lung disease along with any of the following: Making high-pitched whistling sounds when you breathe, most often when you breathe out (wheezing). Prolonged cough (more than 14 days). Coughing up blood. A change in your usual mucus. You have a stiff neck. You have changes in your: Vision. Hearing. Thinking. Mood. These symptoms may be an emergency. Get help right away. Call 911. Do not wait to see if the symptoms will go away. Do not drive yourself to the hospital.

## 2023-11-22 ENCOUNTER — Ambulatory Visit (HOSPITAL_COMMUNITY)
Admission: EM | Admit: 2023-11-22 | Discharge: 2023-11-22 | Disposition: A | Discharge status: Home / Self Care | Payer: Managed Care, Other (non HMO)

## 2023-11-22 DIAGNOSIS — F411 Generalized anxiety disorder: Secondary | ICD-10-CM | POA: Diagnosis not present

## 2023-11-22 NOTE — ED Provider Notes (Signed)
 Behavioral Health Urgent Care Medical Screening Exam  Patient Name: Rebekah Davis MRN: [id number] Date of Evaluation: 11/22/23 Chief Complaint:  I stopped my meds a month ago Diagnosis:  Final diagnoses:  Generalized anxiety disorder    History of Present illness: Rebekah Davis [age] y.o., female patient presented to [facility] as a voluntary walk in accompanied by her church friend with complaints of anxiety and staring spells. She currently has an outpatient psychiatrist and therapist through [facility] and is prescribed PRN Ativan  0.25mg  and PRN Vistaril  10mg   for anxiety but does not take both. PPH of PTSD, GAD, MDD and Somatization d/o. She was tapered off of Abilify  and Lexapro  stopped taking them about 1 month ago.  Rebekah Davis, is seen face to face by this provider, consulted with Dr. Zouev and chart reviewed on 11/22/23.    On evaluation Rebekah Davis reports I had a gynecological surgery in November and they said I was asystole for about 10 seconds. I feel like since then, I have been doing better with my depression and even tapered off the Lexapro  and Abilify  after talking with my psychiatrist. I was on those for about 9 years. I have still not been depressed but have been more anxious lately. I have also been having these episode where I stare at something and get locked in on it, eventually black out and feel like I am going to fall. When I snap out of it I feel like I'm in a postictal phase. Im worried that I am maybe having either withdrawals from my psych meds or small seizures.. Pt reports mother has a history of grand mal seizures.  Patient states that she had an EEG done as a teenager which showed abnormal brain waves.  She states that she had a follow-up with neurology until she was 38 years old but was never diagnosed with seizures.  Patient also reports decreased sleep and appetite.  She reports history of alcohol use disorder but has not had alcohol in 13 years.  She denies any  current substance use.  She endorses a history of self injures behaviors and last engaged in about 1 year ago.  She denies suicidal and homicidal ideations.  She also denies having any hallucinations or paranoia.  We discussed the importance of first ruling out any medical conditions such as seizures.  She has a primary care appointment set up for Wednesday to address these concerns and possibly obtain a neuro consult.  She also has an outpatient psychiatry appointment scheduled for Monday morning.  She states that she will be staying with a friend from church this weekend and is instructed to go to the nearest ER if any seizure activity presents.  During evaluation Rebekah Davis is sitting up in the waiting area, in no acute distress.  She is alert & oriented x 4, calm, cooperative and attentive for this assessment. Her mood is anxious with congruent affect. She has normal speech, and behavior.  Objectively there is no evidence of psychosis/mania or delusional thinking. Pt does not appear to be responding to internal or external stimuli.  Patient is able to converse coherently, goal directed thoughts, no distractibility, or pre-occupation.  She also denies suicidal/self-harm/homicidal ideation, psychosis, and paranoia.  Patient answered question appropriately.    Flowsheet Row ED from 11/22/2023 in [facility] ED from 10/15/2023 in [facility] Emergency Department at [facility] ED from 07/17/2023 in [facility] Emergency Department at [facility]  C-SSRS RISK CATEGORY No Risk  No Risk No Risk       Psychiatric Specialty Exam  Presentation  General Appearance:Appropriate for Environment  Eye Contact:Good  Speech:Clear and Coherent  Speech Volume:Normal  Handedness:Right   Mood and Affect  Mood: Anxious  Affect: Appropriate; Congruent   Thought Process  Thought Processes: Coherent; Linear  Descriptions of  Associations:Intact  Orientation:Full (Time, Place and Person)  Thought Content:WDL  Diagnosis of Schizophrenia or Schizoaffective disorder in past: No data recorded  Hallucinations:None  Ideas of Reference:None  Suicidal Thoughts:No  Homicidal Thoughts:No   Sensorium  Memory: Immediate Good; Recent Good  Judgment: Good  Insight: Good   Executive Functions  Concentration: Good  Attention Span: Good  Recall: Good  Fund of Knowledge: Good  Language: Good   Psychomotor Activity  Psychomotor Activity: Normal   Assets  Assets: Desire for Improvement; Housing; Physical Health; Social Support; Vocational/Educational; Health And Safety Inspector; Resilience; Transportation; Communication Skills   Sleep  Sleep: Fair  Number of hours:  6   Physical Exam: Physical Exam Vitals and nursing note reviewed.  Constitutional:      Appearance: Normal appearance.  HENT:     Head: Normocephalic.     Nose: Nose normal.  Eyes:     Extraocular Movements: Extraocular movements intact.  Cardiovascular:     Rate and Rhythm: Normal rate.  Pulmonary:     Effort: Pulmonary effort is normal.  Abdominal:     Tenderness: There is abdominal tenderness.     Comments: Tender from recent GYN surgery  Musculoskeletal:        General: Normal range of motion.     Cervical back: Normal range of motion.  Neurological:     General: No focal deficit present.     Mental Status: She is alert and oriented to person, place, and time.    Review of Systems  Constitutional: Negative.   HENT: Negative.    Eyes: Negative.   Respiratory: Negative.    Cardiovascular: Negative.   Gastrointestinal: Negative.   Genitourinary: Negative.   Musculoskeletal: Negative.   Neurological: Negative.   Endo/Heme/Allergies: Negative.   Psychiatric/Behavioral:  The patient is nervous/anxious.    Blood pressure (!) 152/75, pulse 100, resp. rate 19, SpO2 99%. There is no height or weight  on file to calculate BMI.  Musculoskeletal: Strength & Muscle Tone: within normal limits Gait & Station: normal Patient leans: N/A   [facility] MSE Discharge Disposition for Follow up and Recommendations: Based on my evaluation the patient does not appear to have an emergency medical condition and can be discharged with resources and follow up care in outpatient services for Medication Management and Individual Therapy  Rebekah Davis is discharged from [facility]. She is able to contract for safety.  She denies suicidal ideations and self harming.  No medication changes were made at this visit she will continue taking medications prescribed by her outpatient psychiatrist.  We did discuss possibly restarting an SSRI to help manage anxiety at baseline.  She will discuss this with her outpatient provider at appointment scheduled for Monday.  Primary care appointment was made on Wednesday to evaluate for possible seizure activity due to history of abnormal EEG and mother with history of grand mal seizures.  Patient will be staying with friends from church over the weekend and is to present to the nearest emergency room if having any concerns for seizure activity.  Alan JAYSON Mcardle, NP 11/22/2023, 3:49 PM

## 2023-11-22 NOTE — Progress Notes (Signed)
   11/22/23 1234  [facility] Triage Screening (Walk-ins at [facility] only)  How Did You Hear About Us ? Family/Friend  What Is the Reason for Your Visit/Call Today? Coia is a 38 year old female presenting to [facility] accompanied by her friend. Pt reports she came off of her medication at the beginning of Jan. Pt mentions that she is having withdrawls from not taking her medication. Pt does have an appointment on Monday for a medication refill, but she does not want to wait until then. Pt is looking to be on a different medication at this time. Pt does have a therapist and was told to come here after talking to her therapist today. Pt denies substance use, Si, Hi and Avh.  How Long Has This Been Causing You Problems? > than 6 months  Have You Recently Had Any Thoughts About Hurting Yourself? No  Are You Planning to Commit Suicide/Harm Yourself At This time? No  Have you Recently Had Thoughts About Hurting Someone [name]? No  Are You Planning To Harm Someone At This Time? No  Physical Abuse Denies  Verbal Abuse Denies  Sexual Abuse Denies  Exploitation of patient/patient's resources Denies  Self-Neglect Denies  Possible abuse reported to: Other (Comment)  Are you currently experiencing any auditory, visual or other hallucinations? No  Have You Used Any Alcohol or Drugs in the Past 24 Hours? No  Do you have any current medical co-morbidities that require immediate attention? No  Clinician description of patient physical appearance/behavior: calm, cooperative  What Do You Feel Would Help You the Most Today? Medication(s)  If access to [facility] was not available, would you have sought care in the Emergency Department? No  Determination of Need Routine (7 days)  Options For Referral Medication Management

## 2023-11-22 NOTE — Discharge Instructions (Addendum)
 Discharge recommendations:   Medications: No new medication changes at this visit. Please continue taking prescribed Vistaril  or Ativan  as needed for anxiety or panic symptoms. Patient is to take medications as prescribed. The patient is to contact a medical professional and/or outpatient provider to address any new side effects that develop. The patient should update outpatient providers of any new medications and/or medication changes.    Outpatient Follow up: Please go to your psychiatry appointment on Monday 11/25/23.  Also. please schedule an appointment with your Primary Care Provider to discuss concerns about possible seizures.   Therapy: Please follow up with current outpatient therapy/ counseling. We recommend that patient participate in individual therapy to address mental health concerns.   Atypical antipsychotics: If you are prescribed an atypical antipsychotic, it is recommended that your height, weight, BMI, blood pressure, fasting lipid panel, and fasting blood sugar be monitored by your outpatient providers.  Safety:   The following safety precautions should be taken:   No sharp objects. This includes scissors, razors, scrapers, and putty knives.   Chemicals should be removed and locked up.   Medications should be removed and locked up.   Weapons should be removed and locked up. This includes firearms, knives and instruments that can be used to cause injury.   The patient should abstain from use of illicit substances/drugs and abuse of any medications.  If symptoms worsen or do not continue to improve or if the patient becomes actively suicidal or homicidal then it is recommended that the patient return to the closest hospital emergency department, the [facility], or call 911 for further evaluation and treatment. National Suicide Prevention Lifeline 1-800-SUICIDE or (830)085-0915.  About 988 988 offers 24/7 access to trained crisis  counselors who can help people experiencing mental health-related distress. People can call or text 988 or chat 988lifeline.org for themselves or if they are worried about a loved one who may need crisis support.

## 2023-11-26 ENCOUNTER — Encounter: Payer: Self-pay | Admitting: Neurology

## 2023-11-26 ENCOUNTER — Ambulatory Visit (INDEPENDENT_AMBULATORY_CARE_PROVIDER_SITE_OTHER): Payer: Managed Care, Other (non HMO) | Admitting: Neurology

## 2023-11-26 VITALS — BP 150/98 | HR 100 | Ht 65.25 in | Wt 338.6 lb

## 2023-11-26 DIAGNOSIS — R569 Unspecified convulsions: Secondary | ICD-10-CM | POA: Diagnosis not present

## 2023-11-26 MED ORDER — LAMOTRIGINE 25 MG PO TABS: ORAL_TABLET | ORAL | 0 refills | Status: DC

## 2023-11-26 MED ORDER — LAMOTRIGINE 100 MG PO TABS: 100.0000 mg | ORAL_TABLET | Freq: Two times a day (BID) | ORAL | 11 refills | Status: DC

## 2023-11-26 MED ORDER — LORAZEPAM 1 MG PO TABS: ORAL_TABLET | ORAL | 0 refills | Status: DC

## 2023-11-26 NOTE — Progress Notes (Signed)
Chief Complaint  Patient presents with   New Patient (Initial Visit)    Rm15, alone, NP paper referral for seizure like activity / Sabino Dick DO [facility] [phone]: last episode yesterday. Denied taking any sz meds   ASSESSMENT AND PLAN  Rebekah Davis is a 38 y.o. female   Seizure like activity. Depression, anxiety, PTSD.  MRI brain w/wo  EEG  Empirically treat her with Lamotrigine 25mg  titrating to 100mg  bid.  No driving until episode free for 6 months. Suspicious for intracranial hypertension  Refer to opthamlogist  Return To Clinic With NP In 6 Months   DIAGNOSTIC DATA (LABS, IMAGING, TESTING) - I reviewed patient records, labs, notes, testing and imaging myself where available.   MEDICAL HISTORY:  Rebekah Davis is a 38 year old female, seen in request by her primary care from [facility], Dr. Paulino Rily, Jasmine December, for evaluation of seizure-like spell, initial evaluation was on November 26, 2023  History is obtained from the patient and review of electronic medical records. I personally reviewed pertinent available imaging films in PACS.   PMHx of  Obesity Depression, Anxiety Kidney Stone PTSD History of Alcohol, tobacco use, quit since 2020 Lumbar decompression surgery, for left lumbar radiculopathy in 2007.\ History of left Achille's tendon repair  She reported seizure-like activity since February 2025, described episode of staring into space, eyes rolled back, aware of the surroundings, but could not respond to it, after few minutes, she can snap out of it, but feel tired, anxious,  I was able to talking with her roommate Lequita Halt, who witnessed her episode on November 25, 2023, patient was lying on the sofa, then eyes rolled back into her head, some body tremor, not responding to her, lasting 30 minutes  I was also able to talk with her psychotherapist Kem Kays, who reported that witnessed a few episode during her psychotherapy,  often triggered by recording stressful event in the past, episode can be long-lasting, few minutes or even longer, last reported episode was on February 10 at nighttime, patient texted her psychotherapist reporting the spells, who end up having a phone conversation with patient, mentation to gradually drifting to sleep,   Her mother suffered temporal lobe seizure, she witnessed many episode of her mother staring into space, some tonic-clonic seizure activity too   PHYSICAL EXAM:   Vitals:   11/26/23 0855 11/26/23 0857  BP: (!) 157/90 (!) 150/98  Pulse: 93 100  Weight: (!) 338 lb 9.6 oz (153.6 kg)   Height: 5' 5.25" (1.657 m)      Body mass index is 55.92 kg/m.  PHYSICAL EXAMNIATION:  Gen: NAD, conversant, well nourised, well groomed                     Cardiovascular: Regular rate rhythm, no peripheral edema, warm, nontender. Eyes: Conjunctivae clear without exudates or hemorrhage Neck: Supple, no carotid bruits. Pulmonary: Clear to auscultation bilaterally   NEUROLOGICAL EXAM:  MENTAL STATUS: Speech/cognition: Awake, alert, oriented to history taking and casual conversation CRANIAL NERVES: CN II: Visual fields are full to confrontation. Pupils are round equal and briskly reactive to light. CN III, IV, VI: extraocular movement are normal. No ptosis. CN V: Facial sensation is intact to light touch CN VII: Face is symmetric with normal eye closure  CN VIII: Hearing is normal to causal conversation. CN IX, X: Phonation is normal. CN XI: Head turning and shoulder shrug are intact  MOTOR: There is no pronator drift of out-stretched arms. Muscle  bulk and tone are normal. Muscle strength is normal.  REFLEXES: Reflexes are 2+ and symmetric at the biceps, triceps, knees, and ankles. Plantar responses are flexor.  SENSORY: Intact to light touch, pinprick and vibratory sensation are intact in fingers and toes.  COORDINATION: There is no trunk or limb dysmetria  noted.  GAIT/STANCE: Posture is normal. Gait is steady with normal steps, base, arm swing, and turning. Heel and toe walking are normal. Tandem gait is normal.  Romberg is absent.  REVIEW OF SYSTEMS:  Full 14 system review of systems performed and notable only for as above All other review of systems were negative.   ALLERGIES: Allergies  Allergen Reactions   Adhesive [Tape] Other (See Comments)    blister   Cherry Anaphylaxis and Rash   Sulfamethoxazole-Trimethoprim Hives   Levofloxacin Other (See Comments)    Muscle Pain   Morphine Other (See Comments)    Feels like unable to breath or swallow    Gabapentin Rash   Metformin Nausea And Vomiting and Other (See Comments)    Diaphoresis    Sumatriptan Diarrhea, Nausea And Vomiting and Other (See Comments)    Decreased heart rate and respiratory rate      HOME MEDICATIONS: Current Outpatient Medications  Medication Sig Dispense Refill   Acetaminophen (TYLENOL PO) Take by mouth.     albuterol (VENTOLIN HFA) 108 (90 Base) MCG/ACT inhaler Inhale 2 puffs into the lungs as needed for wheezing or shortness of breath. 6.7 g 1   ergocalciferol (VITAMIN D2) 1.25 MG (50000 UT) capsule Take 50,000 Units by mouth once a week.     hydrOXYzine (ATARAX) 25 MG tablet Take 1 tablet (25 mg total) by mouth 3 (three) times daily as needed for anxiety. 30 tablet 0   IBUPROFEN PO Take by mouth.     LORazepam (ATIVAN) 0.5 MG tablet Take 0.25-0.5 mg by mouth every 4 (four) hours as needed.     No current facility-administered medications for this visit.    PAST MEDICAL HISTORY: Past Medical History:  Diagnosis Date   Abdominal pain, suprapubic 06/04/2016   Abnormal Pap smear of cervix    Acute cystitis 03/10/2018   Anxiety    Arthritis    C. difficile diarrhea 06/12/2018   Cataract    Chest pain 03/28/2018   Depression    Depression with suicidal ideation 03/09/2018   Diarrhea 06/13/2018   GERD (gastroesophageal reflux disease)     History of kidney stones    Hypomagnesemia 06/13/2018   Low back pain 07/22/2012   Migraine    Non-suicidal self harm as coping mechanism (HCC) 12/25/2019   Oral thrush 06/12/2018   Ovarian cyst    PCOS (polycystic ovarian syndrome)    PTSD (post-traumatic stress disorder)    Scarlet fever    Sleep apnea    Substance abuse (HCC)    Suicidal ideation 04/12/2018    PAST SURGICAL HISTORY: Past Surgical History:  Procedure Laterality Date   BILATERAL SALPINGOECTOMY     LUMBAR LAMINECTOMY     RIGHT OOPHERECTOMY Right    TENOLYSIS Left 10/04/2020   Procedure: LEFT ACHILLES TENDON DEBRIDEMENT AND RECONSTRUCTION, CALCANEAL EXOSTECTOMY, GASTROCNEMIUS RECESSION,  FLEXOR HALLUCIS LONGUS TRANSFER;  Surgeon: Terance Hart, MD;  Location: [facility] OR;  Service: Orthopedics;  Laterality: Left;  LENGTH OF SURGERY: 1.5 HOURS   TONSILLECTOMY      FAMILY HISTORY: Family History  Problem Relation Age of Onset   Thyroid disease Mother    Hypertension Mother  Diabetes Mother    Kidney disease Mother    Depression Mother    Hypertension Father    Diabetes Father    Ovarian cancer Maternal Grandmother    Other Maternal Grandmother        spine cancer   Heart disease Paternal Grandmother    Stroke Paternal Grandfather     SOCIAL HISTORY: Social History   Socioeconomic History   Marital status: Single    Spouse name: Not on file   Number of children: 0   Years of education: Not on file   Highest education level: Bachelor's degree (e.g., BA, AB, BS)  Occupational History   Occupation: unemployed  Tobacco Use   Smoking status: Former    Current packs/day: 0.00    Average packs/day: 1 pack/day for 10.0 years (10.0 ttl pk-yrs)    Types: Cigarettes    Start date: 03/2009    Quit date: 03/2019    Years since quitting: 4.7   Smokeless tobacco: Never  Vaping Use   Vaping status: Never Used  Substance and Sexual Activity   Alcohol use: Not Currently   Drug use: Not Currently    Sexual activity: Not Currently    Partners: Male    Birth control/protection: Abstinence  Other Topics Concern   Not on file  Social History Narrative   Not on file   Social Drivers of Health   Financial Resource Strain: Low Risk  (07/10/2022)   Overall Financial Resource Strain (CARDIA)    Difficulty of Paying Living Expenses: Not hard at all  Food Insecurity: Low Risk  (11/05/2023)   Received from [facility]   Hunger Vital Sign    Worried About Running Out of Food in the Last Year: Never true    Ran Out of Food in the Last Year: Never true  Recent Concern: Food Insecurity - Medium Risk (09/05/2023)   Received from [facility]   Hunger Vital Sign    Worried About Running Out of Food in the Last Year: Sometimes true    Ran Out of Food in the Last Year: Sometimes true  Transportation Needs: No Transportation Needs (11/05/2023)   Received from [facility]    In the past 12 months, has lack of reliable transportation kept you from medical appointments, meetings, work or from getting things needed for daily living? : No  Physical Activity: Insufficiently Active (07/10/2022)   Exercise Vital Sign    Days of Exercise per Week: 3 days    Minutes of Exercise per Session: 40 min  Stress: No Stress Concern Present (07/10/2022)   [employer] of Occupational Health - Occupational Stress Questionnaire    Feeling of Stress : Only a little  Social Connections: Moderately Isolated (07/10/2022)   Social Connection and Isolation Panel [NHANES]    Frequency of Communication with Friends and Family: Three times a week    Frequency of Social Gatherings with Friends and Family: More than three times a week    Attends Religious Services: More than 4 times per year    Active Member of [employer] or Organizations: No    Attends Banker Meetings: Never    Marital Status: Never married  Intimate Partner Violence: Not At Risk (07/10/2022)   Humiliation, Afraid, Rape, and  Kick questionnaire    Fear of Current or Ex-Partner: No    Emotionally Abused: No    Physically Abused: No    Sexually Abused: No      Levert Feinstein, M.D.  Ph.D.  [facility] [address], [address] [place], [place] [address] Ph: [phone] Fax: [phone]  CC:  [facility] [address] [place],  [place] [address]  Mila Palmer, MD

## 2023-11-27 ENCOUNTER — Ambulatory Visit (INDEPENDENT_AMBULATORY_CARE_PROVIDER_SITE_OTHER): Payer: Medicare Other | Admitting: Neurology

## 2023-11-27 ENCOUNTER — Telehealth: Payer: Self-pay | Admitting: Neurology

## 2023-11-27 DIAGNOSIS — R569 Unspecified convulsions: Secondary | ICD-10-CM

## 2023-11-27 NOTE — Telephone Encounter (Signed)
Referral for ophthalmology fax to [facility]. Phone: [phone], Fax: [phone]

## 2023-11-28 ENCOUNTER — Telehealth: Payer: Self-pay | Admitting: Neurology

## 2023-11-28 NOTE — Telephone Encounter (Signed)
LVM for patient to return call.

## 2023-11-28 NOTE — Telephone Encounter (Signed)
Letter emailed

## 2023-11-28 NOTE — Telephone Encounter (Signed)
Pt said employer is requiring a letter from the neurologist stating legal restrictions for driving and what can do and can not do. Does the neurologist send any thing to the [facility] for me to follow up with them?  Need before the weekend (before Saturday morning) probably fired if do get this letter.

## 2023-11-28 NOTE — Telephone Encounter (Signed)
Patient returned call, discussed visit with Dr. Terrace Arabia and I advised we will send a letter in regards to driving. Patient would like it emailed to [email or web address].

## 2023-12-02 ENCOUNTER — Telehealth: Payer: Self-pay | Admitting: Neurology

## 2023-12-02 NOTE — Telephone Encounter (Signed)
medicare/[employer], [place]: [id number] exp. 12/02/23-05/30/24 sent to [facility] for open MRI. [phone]

## 2023-12-03 ENCOUNTER — Encounter: Payer: Self-pay | Admitting: Neurology

## 2023-12-03 ENCOUNTER — Telehealth: Payer: Self-pay | Admitting: Neurology

## 2023-12-03 NOTE — Telephone Encounter (Signed)
Pt has requested a letter with her diagnosis from Dr Terrace Arabia, please send to her thru my chart

## 2023-12-05 ENCOUNTER — Telehealth: Payer: Self-pay | Admitting: Neurology

## 2023-12-05 ENCOUNTER — Telehealth: Payer: Self-pay

## 2023-12-05 NOTE — Telephone Encounter (Signed)
Call to patient, reviewed EEG results and answered questions around MRI. Also called pharmacy and spoke with Lilia and gave verbal order for Lamictal 25 mg titration as in mediation list. Advised patient is rash appears to stop medication and let us know. EEG results printed and left at front for patient pick up. Patient emotional on phone. I called patient back to check on her and she had calmed down and sem to be doing better she was appreciative of call.

## 2023-12-05 NOTE — Telephone Encounter (Signed)
Pt called wanting to know if her EEG results are available.

## 2023-12-05 NOTE — Procedures (Addendum)
   HISTORY: 38 year old female presenting with seizure-like activity  TECHNIQUE:  This is a routine 16 channel EEG recording with one channel devoted to a limited EKG recording.  It was performed during wakefulness, drowsiness and asleep.  Photic stimulation were performed as activating procedures.  There are frequent muscle and movement artifact noted.  Upon maximum arousal, posterior dominant waking rhythm consistent of mildly dysrhythmic low amplitude alpha range activity. Activities are symmetric over the bilateral posterior derivations and attenuated with eye opening.  Photic stimulation did not alter the tracing.  Hyperventilation was not preformed  During EEG recording, patient developed drowsiness and no deeper stage of sleep was achieved  During EEG recording, there was no epileptiform discharge noted.  EKG demonstrate normal sinus rhythm.  CONCLUSION: This is a  normal awake EEG.  There is no electrodiagnostic evidence of epileptiform discharge.  Levert Feinstein, M.D. Ph.D.  [facility] [address] [place], [place] [address] Phone: [phone] Fax:      [phone]

## 2023-12-06 ENCOUNTER — Telehealth: Payer: Self-pay | Admitting: Neurology

## 2023-12-06 NOTE — Telephone Encounter (Signed)
Pt requesting a call to discuss EEG and what actually occurred during test.

## 2023-12-09 ENCOUNTER — Telehealth: Payer: Self-pay

## 2023-12-09 ENCOUNTER — Telehealth (INDEPENDENT_AMBULATORY_CARE_PROVIDER_SITE_OTHER): Payer: Medicare Other | Admitting: Neurology

## 2023-12-09 DIAGNOSIS — F4311 Post-traumatic stress disorder, acute: Secondary | ICD-10-CM | POA: Diagnosis not present

## 2023-12-09 DIAGNOSIS — F32A Depression, unspecified: Secondary | ICD-10-CM

## 2023-12-09 DIAGNOSIS — F39 Unspecified mood [affective] disorder: Secondary | ICD-10-CM

## 2023-12-09 DIAGNOSIS — F419 Anxiety disorder, unspecified: Secondary | ICD-10-CM | POA: Diagnosis not present

## 2023-12-09 DIAGNOSIS — R569 Unspecified convulsions: Secondary | ICD-10-CM | POA: Diagnosis not present

## 2023-12-09 DIAGNOSIS — E669 Obesity, unspecified: Secondary | ICD-10-CM

## 2023-12-09 NOTE — Progress Notes (Signed)
 No chief complaint on file.  ASSESSMENT AND PLAN  Rebekah Davis is a 38 y.o. female   Seizure like activity. Depression, anxiety, PTSD Suspicious for intracranial hypertension  Refer to opthamlogist.  MRI brain w/wo  EEG showed no significant abnormality  No driving until episode free for 6 months.  Ordered 72 hours video EEG monitoring, she will call office to update insurance information  Return To Clinic With NP In 6 Months   DIAGNOSTIC DATA (LABS, IMAGING, TESTING) - I reviewed patient records, labs, notes, testing and imaging myself where available.   MEDICAL HISTORY:  Rebekah Davis is a 38 year old female, seen in request by her primary care from [facility], Dr. Paulino Rily, Jasmine December, for evaluation of seizure-like spell, initial evaluation was on November 26, 2023  History is obtained from the patient and review of electronic medical records. I personally reviewed pertinent available imaging films in PACS.   PMHx of  Obesity Depression, Anxiety Kidney Stone PTSD History of Alcohol, tobacco use, quit since 2020 Lumbar decompression surgery, for left lumbar radiculopathy in 2007.\ History of left Achille's tendon repair  She reported seizure-like activity since February 2025, described episode of staring into space, eyes rolled back, aware of the surroundings, but could not respond to it, after few minutes, she can snap out of it, but feel tired, anxious,  I was able to talking with her roommate Lequita Halt, who witnessed her episode on November 25, 2023, patient was lying on the sofa, then eyes rolled back into her head, some body tremor, not responding to her, lasting 30 minutes  I was also able to talk with her psychotherapist Kem Kays, who reported that witnessed a few episode during her psychotherapy, often triggered by recording stressful event in the past, episode can be long-lasting, few minutes or even longer, last reported episode was on February 10 at  nighttime, patient texted her psychotherapist reporting the spells, who end up having a phone conversation with patient, mentation to gradually drifting to sleep,   Her mother suffered temporal lobe seizure, she witnessed many episode of her mother staring into space, some tonic-clonic seizure activity too   Virtual Visit via video UPDATE Dec 09 2023 I discussed the limitations of evaluation and management by telemedicine and the availability of in person appointments. The patient expressed understanding and agreed to proceed  Location: Provider: [facility] office; Patient: Home  I connected with Haden Cavenaugh  on Feb 24th 2025  by a video enabled telemedicine application and verified that I am speaking with the correct person using two identifiers.  UPDATED HiSTORY Patient lost her job in the past couple days, has a lot of question related to her EEG findings, we reviewed the EEG together, there was no significant found, she still have recurrent spells of transient paresthesia, eye rolling back, I have suggested 72 hours video EEG monitoring, she will call back office to update her current insurance information's  I of the brain and ophthalmology evaluation is pending   Observations/Objective: I have reviewed problem lists, medications, allergies.  Awake, alert, oriented to history taking and casual conversation, facial symmetric, no dysarthria, no aphasia, moving upper extremity without difficulties, gait was not evaluated   REVIEW OF SYSTEMS:  Full 14 system review of systems performed and notable only for as above All other review of systems were negative.   ALLERGIES: Allergies  Allergen Reactions   Adhesive [Tape] Other (See Comments)    blister   Cherry Anaphylaxis and Rash   Sulfamethoxazole-Trimethoprim Hives  Levofloxacin Other (See Comments)    Muscle Pain   Morphine Other (See Comments)    Feels like unable to breath or swallow    Gabapentin Rash   Metformin Nausea And  Vomiting and Other (See Comments)    Diaphoresis    Sumatriptan Diarrhea, Nausea And Vomiting and Other (See Comments)    Decreased heart rate and respiratory rate      HOME MEDICATIONS: Current Outpatient Medications  Medication Sig Dispense Refill   Acetaminophen (TYLENOL PO) Take by mouth.     albuterol (VENTOLIN HFA) 108 (90 Base) MCG/ACT inhaler Inhale 2 puffs into the lungs as needed for wheezing or shortness of breath. 6.7 g 1   ergocalciferol (VITAMIN D2) 1.25 MG (50000 UT) capsule Take 50,000 Units by mouth once a week.     hydrOXYzine (ATARAX) 25 MG tablet Take 1 tablet (25 mg total) by mouth 3 (three) times daily as needed for anxiety. 30 tablet 0   IBUPROFEN PO Take by mouth.     lamoTRIgine (LAMICTAL) 100 MG tablet Take 1 tablet (100 mg total) by mouth 2 (two) times daily. 60 tablet 11   lamoTRIgine (LAMICTAL) 25 MG tablet 1 twice a day x1wk 2 twice a day x 2nd wk 3 twice a day x3rd wk 84 tablet 0   LORazepam (ATIVAN) 0.5 MG tablet Take 0.25-0.5 mg by mouth every 4 (four) hours as needed.     LORazepam (ATIVAN) 1 MG tablet Take 1-2 tablets 30 minutes prior to MRI, may repeat once as needed. Must have driver. 3 tablet 0   No current facility-administered medications for this visit.    PAST MEDICAL HISTORY: Past Medical History:  Diagnosis Date   Abdominal pain, suprapubic 06/04/2016   Abnormal Pap smear of cervix    Acute cystitis 03/10/2018   Anxiety    Arthritis    C. difficile diarrhea 06/12/2018   Cataract    Chest pain 03/28/2018   Depression    Depression with suicidal ideation 03/09/2018   Diarrhea 06/13/2018   GERD (gastroesophageal reflux disease)    History of kidney stones    Hypomagnesemia 06/13/2018   Low back pain 07/22/2012   Migraine    Non-suicidal self harm as coping mechanism (HCC) 12/25/2019   Oral thrush 06/12/2018   Ovarian cyst    PCOS (polycystic ovarian syndrome)    PTSD (post-traumatic stress disorder)    Scarlet fever    Sleep  apnea    Substance abuse (HCC)    Suicidal ideation 04/12/2018    PAST SURGICAL HISTORY: Past Surgical History:  Procedure Laterality Date   BILATERAL SALPINGOECTOMY     LUMBAR LAMINECTOMY     RIGHT OOPHERECTOMY Right    TENOLYSIS Left 10/04/2020   Procedure: LEFT ACHILLES TENDON DEBRIDEMENT AND RECONSTRUCTION, CALCANEAL EXOSTECTOMY, GASTROCNEMIUS RECESSION,  FLEXOR HALLUCIS LONGUS TRANSFER;  Surgeon: Terance Hart, MD;  Location: [facility] OR;  Service: Orthopedics;  Laterality: Left;  LENGTH OF SURGERY: 1.5 HOURS   TONSILLECTOMY      FAMILY HISTORY: Family History  Problem Relation Age of Onset   Thyroid disease Mother    Hypertension Mother    Diabetes Mother    Kidney disease Mother    Depression Mother    Hypertension Father    Diabetes Father    Ovarian cancer Maternal Grandmother    Other Maternal Grandmother        spine cancer   Heart disease Paternal Grandmother    Stroke Paternal Grandfather     SOCIAL  HISTORY: Social History   Socioeconomic History   Marital status: Single    Spouse name: Not on file   Number of children: 0   Years of education: Not on file   Highest education level: Bachelor's degree (e.g., BA, AB, BS)  Occupational History   Occupation: unemployed  Tobacco Use   Smoking status: Former    Current packs/day: 0.00    Average packs/day: 1 pack/day for 10.0 years (10.0 ttl pk-yrs)    Types: Cigarettes    Start date: 03/2009    Quit date: 03/2019    Years since quitting: 4.7   Smokeless tobacco: Never  Vaping Use   Vaping status: Never Used  Substance and Sexual Activity   Alcohol use: Not Currently   Drug use: Not Currently   Sexual activity: Not Currently    Partners: Male    Birth control/protection: Abstinence  Other Topics Concern   Not on file  Social History Narrative   Not on file   Social Drivers of Health   Financial Resource Strain: Low Risk  (07/10/2022)   Overall Financial Resource Strain (CARDIA)    Difficulty  of Paying Living Expenses: Not hard at all  Food Insecurity: Low Risk  (11/05/2023)   Received from [facility]   Hunger Vital Sign    Worried About Running Out of Food in the Last Year: Never true    Ran Out of Food in the Last Year: Never true  Recent Concern: Food Insecurity - Medium Risk (09/05/2023)   Received from [facility]   Hunger Vital Sign    Worried About Running Out of Food in the Last Year: Sometimes true    Ran Out of Food in the Last Year: Sometimes true  Transportation Needs: No Transportation Needs (11/05/2023)   Received from [facility]    In the past 12 months, has lack of reliable transportation kept you from medical appointments, meetings, work or from getting things needed for daily living? : No  Physical Activity: Insufficiently Active (07/10/2022)   Exercise Vital Sign    Days of Exercise per Week: 3 days    Minutes of Exercise per Session: 40 min  Stress: No Stress Concern Present (07/10/2022)   [employer] of Occupational Health - Occupational Stress Questionnaire    Feeling of Stress : Only a little  Social Connections: Moderately Isolated (07/10/2022)   Social Connection and Isolation Panel [NHANES]    Frequency of Communication with Friends and Family: Three times a week    Frequency of Social Gatherings with Friends and Family: More than three times a week    Attends Religious Services: More than 4 times per year    Active Member of [employer] or Organizations: No    Attends Banker Meetings: Never    Marital Status: Never married  Intimate Partner Violence: Not At Risk (07/10/2022)   Humiliation, Afraid, Rape, and Kick questionnaire    Fear of Current or Ex-Partner: No    Emotionally Abused: No    Physically Abused: No    Sexually Abused: No      Levert Feinstein, M.D. Ph.D.  [facility] [address] [place], [place] [address] Ph: [phone] Fax: [phone]  CC:  [facility] [address] [place],  [place] [address]  Mila Palmer, MD

## 2023-12-09 NOTE — Telephone Encounter (Signed)
 Called pt and let her know that Dr. Terrace Arabia will be late.

## 2023-12-09 NOTE — Telephone Encounter (Signed)
 Call to patient, she is in agreement for VV with Dr. Terrace Arabia today

## 2023-12-10 ENCOUNTER — Telehealth: Payer: Self-pay | Admitting: Neurology

## 2023-12-10 ENCOUNTER — Encounter: Payer: Self-pay | Admitting: Neurology

## 2023-12-10 NOTE — Telephone Encounter (Signed)
 Pt needs a letter with Updated Diagnosis from Last Visit

## 2023-12-10 NOTE — Telephone Encounter (Signed)
 Went over EEG by virtual visit

## 2023-12-10 NOTE — Telephone Encounter (Signed)
 Pt requesting a letter for updated diagnoses.added last office visit.

## 2023-12-10 NOTE — Telephone Encounter (Signed)
 [place] of [employer] [place] Application for transit. Need fillout and faxed by 12/13/23, if they do not received will have to start process over again which will delay transportation for another. Please fax to: [phone], Attention to [facility].

## 2023-12-11 ENCOUNTER — Telehealth: Payer: Self-pay

## 2023-12-11 NOTE — Telephone Encounter (Signed)
 Letter updated and mycart message sent

## 2023-12-11 NOTE — Telephone Encounter (Signed)
 Noted. Will complete when paperwork received.

## 2023-12-12 ENCOUNTER — Encounter: Payer: Self-pay | Admitting: Neurology

## 2023-12-12 NOTE — Telephone Encounter (Signed)
 Updated her dx based on symptoms

## 2023-12-26 NOTE — Telephone Encounter (Signed)
-----   Message from Prentice Graven, PA-C sent at 12/25/2023  3:26 PM EDT ----- Please set up appointment to discuss sleep study results

## 2023-12-26 NOTE — Telephone Encounter (Signed)
 Lvm to call back if appointment does not work.

## 2024-01-07 NOTE — Telephone Encounter (Signed)
 Please pull order.  Rebekah Davis

## 2024-01-16 ENCOUNTER — Other Ambulatory Visit: Payer: Self-pay | Admitting: Family Medicine

## 2024-01-16 DIAGNOSIS — R310 Gross hematuria: Secondary | ICD-10-CM

## 2024-01-21 ENCOUNTER — Encounter: Payer: Self-pay | Admitting: Neurology

## 2024-01-22 ENCOUNTER — Encounter: Payer: Self-pay | Admitting: Neurology

## 2024-01-23 ENCOUNTER — Encounter: Admitting: Psychology

## 2024-01-23 ENCOUNTER — Encounter: Payer: Self-pay | Admitting: Neurology

## 2024-01-24 ENCOUNTER — Telehealth: Payer: Self-pay

## 2024-01-24 DIAGNOSIS — Z609 Problem related to social environment, unspecified: Secondary | ICD-10-CM

## 2024-01-24 NOTE — Patient Outreach (Signed)
 Submitted faxed referral for assistance.  [facility]  Population Health Care Management Assistant  Direct Dial: [phone]  Fax: [phone] Website: [email or web address]

## 2024-01-27 ENCOUNTER — Ambulatory Visit (INDEPENDENT_AMBULATORY_CARE_PROVIDER_SITE_OTHER): Admitting: Psychology

## 2024-01-27 DIAGNOSIS — F332 Major depressive disorder, recurrent severe without psychotic features: Secondary | ICD-10-CM
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
## 2024-02-04 ENCOUNTER — Other Ambulatory Visit (HOSPITAL_COMMUNITY): Payer: Self-pay

## 2024-02-04 MED ORDER — ESCITALOPRAM OXALATE 10 MG PO TABS: 10.0000 mg | ORAL_TABLET | Freq: Every day | ORAL | 0 refills | Status: DC

## 2024-02-04 MED ORDER — ESCITALOPRAM OXALATE 5 MG PO TABS
5.0000 mg | ORAL_TABLET | Freq: Every day | ORAL | 0 refills | Status: DC
Start: 2023-09-23 — End: 2024-03-17

## 2024-02-04 MED ORDER — ONDANSETRON HCL 8 MG PO TABS
8.0000 mg | ORAL_TABLET | Freq: Three times a day (TID) | ORAL | 1 refills | Status: DC | PRN
  Filled 2024-02-22: qty 30, 10d supply, fill #0

## 2024-02-04 MED ORDER — TAMSULOSIN HCL 0.4 MG PO CAPS
0.4000 mg | ORAL_CAPSULE | Freq: Every day | ORAL | 0 refills | Status: DC
Start: 2024-01-24 — End: 2024-04-21

## 2024-02-04 MED ORDER — PHENAZOPYRIDINE HCL 100 MG PO TABS: 100.0000 mg | ORAL_TABLET | Freq: Two times a day (BID) | ORAL | 0 refills | Status: DC

## 2024-02-04 MED ORDER — ERGOCALCIFEROL 1.25 MG (50000 UT) PO CAPS
50000.0000 [IU] | ORAL_CAPSULE | ORAL | 2 refills | Status: AC
  Filled 2024-02-22: qty 4, 28d supply, fill #0

## 2024-02-04 MED ORDER — ARIPIPRAZOLE 5 MG PO TABS: 5.0000 mg | ORAL_TABLET | Freq: Every day | ORAL | 0 refills | Status: DC

## 2024-02-04 MED ORDER — LAMOTRIGINE 100 MG PO TABS: 100.0000 mg | ORAL_TABLET | Freq: Two times a day (BID) | ORAL | 11 refills | Status: DC

## 2024-02-04 NOTE — Congregational Nurse Program (Signed)
  Dept: [phone]   Congregational Nurse Program Note  Date of Encounter: 02/04/2024  New resident of [facility] referred by staff for clinic visit to for medication assistance.  States dermatologist seen yesterday, antibiotic prescribed for acne that she has not been able to pick up.  Acne severe with cheeks and forehead red in color, multiple pimples.    Educated regarding CCNP services, given phone number of [facility] to have prescriptions transferred and filled. Resident to call PCP and dermatologist regarding prescription transfers.   Past Medical History: Past Medical History:  Diagnosis Date   Abdominal pain, suprapubic 06/04/2016   Abnormal Pap smear of cervix    Acute cystitis 03/10/2018   Anxiety    Arthritis    C. difficile diarrhea 06/12/2018   Cataract    Chest pain 03/28/2018   Depression    Depression with suicidal ideation 03/09/2018   Diarrhea 06/13/2018   GERD (gastroesophageal reflux disease)    History of kidney stones    Hypomagnesemia 06/13/2018   Low back pain 07/22/2012   Migraine    Non-suicidal self harm as coping mechanism (HCC) 12/25/2019   Oral thrush 06/12/2018   Ovarian cyst    PCOS (polycystic ovarian syndrome)    PTSD (post-traumatic stress disorder)    Scarlet fever    Sleep apnea    Substance abuse (HCC)    Suicidal ideation 04/12/2018    Encounter Details:  Community Questionnaire - 02/04/24 1350       Questionnaire   Ask client: Do you give verbal consent for me to treat you today? Yes    Student Assistance N/A    [place]    Encounter Setting CN site    Population Status Unknown   Has apartment at [facility];Medicare    Insurance/Financial Assistance Referral N/A    Medication Have Medication Insecurities;Patient Medications Reviewed;Provided Medication Assistance    Medical Provider Yes    Screening Referrals Made [facility]     Medical Referrals Made [facility] PCP/Clinic    Medical Appointment Completed N/A    CNP Interventions Advocate/Support;Counsel;Navigate Healthcare System;Case Management    Screenings CN Performed N/A    ED Visit Averted N/A    Life-Saving Intervention Made N/A

## 2024-02-09 NOTE — Progress Notes (Signed)
 This encounter was created in error - please disregard.

## 2024-02-10 ENCOUNTER — Ambulatory Visit: Admitting: Urology

## 2024-02-14 ENCOUNTER — Other Ambulatory Visit: Payer: Self-pay

## 2024-02-14 MED ORDER — ALBUTEROL SULFATE HFA 108 (90 BASE) MCG/ACT IN AERS
1.0000 | INHALATION_SPRAY | RESPIRATORY_TRACT | 3 refills | Status: DC | PRN
Start: 2024-02-14 — End: 2024-04-21
  Filled 2024-02-14: qty 18, 17d supply, fill #0

## 2024-02-17 ENCOUNTER — Ambulatory Visit: Admitting: Urology

## 2024-02-17 ENCOUNTER — Other Ambulatory Visit: Payer: Self-pay | Admitting: Family Medicine

## 2024-02-17 ENCOUNTER — Other Ambulatory Visit: Payer: Self-pay

## 2024-02-17 DIAGNOSIS — R591 Generalized enlarged lymph nodes: Secondary | ICD-10-CM

## 2024-02-17 MED ORDER — CYCLOBENZAPRINE HCL 5 MG PO TABS
5.0000 mg | ORAL_TABLET | Freq: Every evening | ORAL | 0 refills | Status: DC | PRN
Start: 2024-02-17 — End: 2024-04-21
  Filled 2024-02-17: qty 14, 14d supply, fill #0

## 2024-02-18 ENCOUNTER — Ambulatory Visit
Admission: RE | Admit: 2024-02-18 | Discharge: 2024-02-18 | Disposition: A | Discharge status: Home / Self Care | Source: Ambulatory Visit | Attending: Family Medicine | Admitting: Family Medicine

## 2024-02-18 ENCOUNTER — Other Ambulatory Visit: Payer: Self-pay

## 2024-02-18 DIAGNOSIS — R591 Generalized enlarged lymph nodes: Secondary | ICD-10-CM

## 2024-02-18 MED ORDER — LORAZEPAM 0.5 MG PO TABS
0.5000 mg | ORAL_TABLET | ORAL | 2 refills | Status: DC
Start: 2024-02-18 — End: 2024-06-12
  Filled 2024-02-18: qty 10, 3d supply, fill #0
  Filled 2024-03-05: qty 10, 1d supply, fill #0

## 2024-02-18 MED ORDER — HYDROXYZINE HCL 10 MG PO TABS
10.0000 mg | ORAL_TABLET | Freq: Every day | ORAL | 0 refills | Status: DC | PRN
Start: 2024-02-18 — End: 2024-03-17
  Filled 2024-02-18: qty 60, 30d supply, fill #0

## 2024-02-20 ENCOUNTER — Ambulatory Visit: Admitting: Psychology

## 2024-02-20 ENCOUNTER — Ambulatory Visit
Admission: EM | Admit: 2024-02-20 | Discharge: 2024-02-20 | Disposition: A | Discharge status: Home / Self Care | Attending: Family Medicine | Admitting: Family Medicine

## 2024-02-20 ENCOUNTER — Other Ambulatory Visit: Payer: Self-pay

## 2024-02-20 DIAGNOSIS — F332 Major depressive disorder, recurrent severe without psychotic features: Secondary | ICD-10-CM

## 2024-02-20 DIAGNOSIS — M545 Low back pain, unspecified: Secondary | ICD-10-CM

## 2024-02-20 DIAGNOSIS — R591 Generalized enlarged lymph nodes: Secondary | ICD-10-CM | POA: Diagnosis not present

## 2024-02-20 HISTORY — DX: Calculus of kidney: N20.0

## 2024-02-20 LAB — POCT URINALYSIS DIP (MANUAL ENTRY)
Bilirubin, UA: NEGATIVE
Glucose, UA: NEGATIVE mg/dL
Ketones, POC UA: NEGATIVE mg/dL
Leukocytes, UA: NEGATIVE
Nitrite, UA: NEGATIVE
Protein Ur, POC: NEGATIVE mg/dL
Spec Grav, UA: 1.025 (ref 1.010–1.025)
Urobilinogen, UA: 1 U/dL
pH, UA: 6 (ref 5.0–8.0)

## 2024-02-20 LAB — POCT URINE PREGNANCY: Preg Test, Ur: NEGATIVE

## 2024-02-20 NOTE — ED Triage Notes (Signed)
 Pt c/o neck [id number]. Right lower back spasms started yesterday, but getting worse. PT denies injury. Pt denies numbness or tingling. Pt denies loss of bowel or bladder.

## 2024-02-20 NOTE — ED Provider Notes (Addendum)
 [facility]    CSN: [id number] Arrival date & time: 02/20/24  1739      History   Chief Complaint No chief complaint on file.   HPI Rebekah Davis is a 38 y.o. female with a complex past medical history who presents for lymph node pain and low back pain.  Patient reports for the past couple weeks she has had enlarged lymph nodes on the left and right side of her neck.  She has been seen by her PCP for this and had an ultrasound that was done 2 days ago but results are pending.  She states she is having increasing pain to the area that she feels migrating towards her collarbones in the center of her chest.  She states she overall "feels like crap".  Denies any fevers but does endorse nausea.  She also reports a couple of days of right lower back pain that occurs primarily with movement.  No known injury.  No numbness/tingling/weakness of her lower extremities, no bowel or bladder incontinence, no saddle paresthesia.  No dysuria.  Reports a history of low back pain and is been taking it 100 mg of ibuprofen  3 times daily as well as 1000 mg of Tylenol  twice daily without improvement.  No other concerns at this time. HPI  Past Medical History:  Diagnosis Date   Abdominal pain, suprapubic 06/04/2016   Abnormal Pap smear of cervix    Acute cystitis 03/10/2018   Anxiety    Arthritis    C. difficile diarrhea 06/12/2018   Cataract    Chest pain 03/28/2018   Depression    Depression with suicidal ideation 03/09/2018   Diarrhea 06/13/2018   GERD (gastroesophageal reflux disease)    History of kidney stones    Hypomagnesemia 06/13/2018   Kidney stone    Low back pain 07/22/2012   Migraine    Non-suicidal self harm as coping mechanism (HCC) 12/25/2019   Oral thrush 06/12/2018   Ovarian cyst    PCOS (polycystic ovarian syndrome)    PTSD (post-traumatic stress disorder)    Scarlet fever    Sleep apnea    Substance abuse (HCC)    Suicidal ideation 04/12/2018    Patient Active  Problem List   Diagnosis Date Noted   Seizure-like activity (HCC) 11/26/2023   Passive suicidal ideations 11/16/2020   Irregular bleeding 04/12/2020   Achilles tendinitis 03/11/2020   Ovarian cyst 02/19/2020   Deliberate self-cutting 01/23/2020   Transaminitis 06/13/2018   Morbid obesity (HCC) 03/28/2018   GERD (gastroesophageal reflux disease) 03/10/2018   Mood disorder (HCC) 03/09/2018   PTSD (post-traumatic stress disorder) 03/09/2018   MDD (major depressive disorder), recurrent severe, 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/2018   Polycystic disease, ovaries 07/05/2016   Spondylolisthesis 04/20/2015   Herniated lumbar intervertebral disc 02/28/2013   Sciatica of left side 02/28/2013   Borderline personality disorder (HCC) 12/29/2012   Anxiety 12/24/2012   Essential hypertension 12/24/2012    Past Surgical History:  Procedure Laterality Date   BILATERAL SALPINGOECTOMY     LUMBAR LAMINECTOMY     RIGHT OOPHERECTOMY Right    TENOLYSIS Left 10/04/2020   Procedure: LEFT ACHILLES TENDON DEBRIDEMENT AND RECONSTRUCTION, CALCANEAL EXOSTECTOMY, GASTROCNEMIUS RECESSION,  FLEXOR HALLUCIS LONGUS TRANSFER;  Surgeon: Donnamarie Gables, MD;  Location: [facility] OR;  Service: Orthopedics;  Laterality: Left;  LENGTH OF SURGERY: 1.5 HOURS   TONSILLECTOMY      OB History     Gravida  0   Para  0   Term  0   Preterm  0   AB  0   Living  0      SAB  0   IAB  0   Ectopic  0   Multiple  0   Live Births  0            Home Medications    Prior to Admission medications   Medication Sig Start Date End Date Taking? Authorizing Provider  Acetaminophen  (TYLENOL  PO) Take by mouth.    [provider]  albuterol  (VENTOLIN  HFA) 108 (90 Base) MCG/ACT inhaler Inhale 2 puffs into the lungs as needed for wheezing or shortness of breath. 08/03/22   Versa Gore, NP  albuterol  (VENTOLIN  HFA) 108 (90 Base) MCG/ACT inhaler Inhale 1 puff into the lungs every 4 (four) hours as needed.  02/14/24     ARIPiprazole  (ABILIFY ) 5 MG tablet Take 1 tablet (5 mg total) by mouth at bedtime for mood. 12/28/23     cyclobenzaprine (FLEXERIL) 5 MG tablet Take 1 tablet (5 mg total) by mouth at bedtime as needed. 02/17/24     ergocalciferol  (VITAMIN D2) 1.25 MG (50000 UT) capsule Take 50,000 Units by mouth once a week. 06/06/23 06/05/24  [provider]  ergocalciferol  (VITAMIN D2) 1.25 MG (50000 UT) capsule Take 1 capsule (50,000 Units total) by mouth once a week. 10/23/23     escitalopram  (LEXAPRO ) 10 MG tablet Take 1 tablet (10 mg total) by mouth daily. 09/23/23     escitalopram  (LEXAPRO ) 5 MG tablet Take 1 tablet (5 mg total) by mouth daily. 09/23/23     hydrOXYzine  (ATARAX ) 10 MG tablet Take 1-2 tablets (10-20 mg total) by mouth daily as needed for anxiety. 02/18/24     hydrOXYzine  (ATARAX ) 25 MG tablet Take 1 tablet (25 mg total) by mouth 3 (three) times daily as needed for anxiety. 11/21/22   Tor Freed, FNP  IBUPROFEN  PO Take by mouth.    [provider]  lamoTRIgine  (LAMICTAL ) 100 MG tablet Take 1 tablet (100 mg total) by mouth 2 (two) times daily. 11/26/23   Phebe Brasil, MD  lamoTRIgine  (LAMICTAL ) 100 MG tablet Take 1 tablet (100 mg total) by mouth 2 (two) times daily. 11/26/23   Phebe Brasil, MD  lamoTRIgine  (LAMICTAL ) 25 MG tablet 1 twice a day x1wk 2 twice a day x 2nd wk 3 twice a day x3rd wk 11/26/23   Phebe Brasil, MD  LORazepam  (ATIVAN ) 0.5 MG tablet Take 0.25-0.5 mg by mouth every 4 (four) hours as needed. 02/18/23   [provider]  LORazepam  (ATIVAN ) 0.5 MG tablet Take 1-2 tablets (0.5-1 mg total) by mouth every 4-6 hours as needed for severe panic attacks. 02/18/24     LORazepam  (ATIVAN ) 1 MG tablet Take 1-2 tablets 30 minutes prior to MRI, may repeat once as needed. Must have driver. 11/26/23   Phebe Brasil, MD  ondansetron  (ZOFRAN ) 8 MG tablet Take 1 tablet (8 mg total) by mouth every 8 (eight) hours as needed for nausea for 10 days. 07/18/23     phenazopyridine  (PYRIDIUM )  100 MG tablet Take 1 tablet (100 mg total) by mouth 2 (two) times daily for Dysuria. 12/28/23     tamsulosin  (FLOMAX ) 0.4 MG CAPS capsule Take 1 capsule (0.4 mg total) by mouth daily. 01/24/24       Family History Family History  Problem Relation Age of Onset   Thyroid  disease Mother    Hypertension Mother    Diabetes Mother    Kidney disease  Mother    Depression Mother    Hypertension Father    Diabetes Father    Ovarian cancer Maternal Grandmother    Other Maternal Grandmother        spine cancer   Heart disease Paternal Grandmother    Stroke Paternal Grandfather     Social History Social History   Tobacco Use   Smoking status: Former    Current packs/day: 0.00    Average packs/day: 1 pack/day for 10.0 years (10.0 ttl pk-yrs)    Types: Cigarettes    Start date: 03/2009    Quit date: 03/2019    Years since quitting: 4.9   Smokeless tobacco: Never  Vaping Use   Vaping status: Never Used  Substance Use Topics   Alcohol use: Not Currently   Drug use: Not Currently     Allergies   Adhesive [tape], Cherry, Sulfamethoxazole-trimethoprim, Levofloxacin, Morphine, Sulfa antibiotics, Tizanidine, Gabapentin, Metformin, and Sumatriptan   Review of Systems Review of Systems  Musculoskeletal:  Positive for back pain and neck pain.     Physical Exam Triage Vital Signs ED Triage Vitals  Encounter Vitals Group     BP 02/20/24 1805 (!) 145/86     Systolic BP Percentile --      Diastolic BP Percentile --      Pulse Rate 02/20/24 1805 94     Resp 02/20/24 1805 18     Temp 02/20/24 1805 98.8 F (37.1 C)     Temp Source 02/20/24 1805 Oral     SpO2 02/20/24 1805 99 %     Weight --      Height --      Head Circumference --      Peak Flow --      Pain Score 02/20/24 1800 7     Pain Loc --      Pain Education --      Exclude from Growth Chart --    No data found.  Updated Vital Signs BP (!) 145/86   Pulse 94   Temp 98.8 F (37.1 C) (Oral)   Resp 18   SpO2 99%    Visual Acuity Right Eye Distance:   Left Eye Distance:   Bilateral Distance:    Right Eye Near:   Left Eye Near:    Bilateral Near:     Physical Exam Vitals and nursing note reviewed.  Constitutional:      General: She is not in acute distress.    Appearance: Normal appearance. She is not ill-appearing.  HENT:     Head: Normocephalic and atraumatic.  Eyes:     Pupils: Pupils are equal, round, and reactive to light.  Neck:     Comments: To mildly enlarged lymph node on right anterior cervical and 1 to the left anterior cervical chain.  No neck swelling.  There is no lymph node swelling along clavicle and no tenderness.  No bruising or swelling. Cardiovascular:     Rate and Rhythm: Normal rate.  Pulmonary:     Effort: Pulmonary effort is normal.  Musculoskeletal:     Cervical back: Normal range of motion and neck supple. No signs of trauma, rigidity or torticollis. Pain with movement present. No spinous process tenderness or muscular tenderness.     Lumbar back: No swelling, edema, signs of trauma, lacerations, spasms, tenderness or bony tenderness. Normal range of motion. No scoliosis.       Back:     Comments: There is no tenderness with palpation to the  right lower back.  Strength is 5 out of 5 bilateral lower extremities.  Skin:    General: Skin is warm and dry.  Neurological:     General: No focal deficit present.     Mental Status: She is alert and oriented to person, place, and time.  Psychiatric:        Mood and Affect: Mood normal.        Behavior: Behavior normal.      UC Treatments / Results  Labs (all labs ordered are listed, but only abnormal results are displayed) Labs Reviewed  POCT URINALYSIS DIP (MANUAL ENTRY) - Abnormal; Notable for the following components:      Result Value   Blood, UA trace-lysed (*)    All other components within normal limits  POCT URINE PREGNANCY    COMPREHENSIVE METABOLIC PANEL Order: [id number] Component Ref Range &  Units 1 mo ago  Sodium 136 - 145 mEq/L 141  Potassium 3.5 - 5.1 mEq/L 3.9  Chloride 98 - 107 mEq/L 107  CO2 22 - 29 mEq/L 22  Calcium 8.4 - 10.2 mg/dL 9.2  Glucose 70 - 875 mg/dL 89  BUN 7 - 19 mg/dL 14  Creatinine 6.43 - 3.29 mg/dL 5.18  Glomerular Filtration Rate >=59 mL/Min/1.73 m2 83  Protein, Total 6.4 - 8.3 g/dL 7.3  Albumin 3.5 - 5.2 g/dL 4.2  Bilirubin, Total 0.1 - 1.2 mg/dL 0.8  Alk Phosphatase 40 - 150 U/L 86  SGOT (AST) 5 - 34 U/L 25  SGPT (ALT) 0 - 55 U/L 37  Anion Gap 4 - 12 mEq/L 12  ACZ:YSAYT Ratio 15.38  Osmo (Calc'd) mOsm/kg 293  Globulin (Calc'd) g/dL 3.1  A:G Ratio 0.16  Resulting Agency [facility] (ACCREDITED BY THE COL[employer] OF AMER[employer] PATH[employer])   Specimen Collected: 01/16/24 17:34   Performed by: [facility] (ACCREDITED BY THE COLLEGE OF AMERICAN PATHOLOGISTS) Last Resulted: 01/16/24 18:37    EKG   Radiology No results found.  Procedures Procedures (including critical care time)  Medications Ordered in UC Medications - No data to display  Initial Impression / Assessment and Plan / UC Course  I have reviewed the triage vital signs and the nursing notes.  Pertinent labs & imaging results that were available during my care of the patient were reviewed by me and considered in my medical decision making (see chart for details).     I reviewed exam and symptoms with patient.  She is already undergoing workup for her enlarged lymph nodes with her PCP.  Reports her neck pain is secondary to this.  She is afebrile and nontoxic in appearance.  She has no tenderness with palpation to her right lower back.  UA shows hematuria which she has a history of.,  Otherwise negative for infection.  Negative hCG.  Discussed treatments for back pain including Toradol , muscle relaxers but patient declined.  Given her reported worsening symptoms regarding her lymph nodes discussed ER evaluation but she declined stating she  just wants to go home.  I advised that she contact her PCP tomorrow to discuss her symptoms to see if there is additional workup and/or treatment that needs to be done.  She again declined any treatment for her low back pain.  Strict ER precautions reviewed and patient verbalized understanding. Final Clinical Impressions(s) / UC Diagnoses   Final diagnoses:  Lymphadenopathy  Acute right-sided low back pain without sciatica     Discharge Instructions      Please follow-up  with your PCP regarding your enlarged lymph nodes.  Please go to the ER for any worsening symptoms.  I hope you feel better soon!   ED Prescriptions   None    PDMP not reviewed this encounter.   Alleen Arbour, NP 02/20/24 1845    Alleen Arbour, NP 02/20/24 [phone]

## 2024-02-20 NOTE — Progress Notes (Addendum)
 [facility] Counselor/Therapist Progress Note  Patient ID: Rebekah Davis, MRN: [id number]   Date: 02/20/24  Time Spent: 3:00 pm - 3:45  pm  : 45  minutes  Treatment Type: Individual Therapy.  Reported Symptoms: Anxiety: feeling nervous and on edge, not being able to control worrying, trouble relaxing, being so restless that it is hard to sit still, becoming easily annoyed or irritable. Depression: overeating, trouble concentrating. Patient previously had thoughts about hurting herself in some way, but denies feeling that way currently.   Mental Status Exam: Appearance:  Casual     Behavior: Appropriate  Motor: Normal  Speech/Language:  Normal Rate  Affect: Appropriate  Mood: normal  Thought process: normal  Thought content:   WNL  Sensory/Perceptual disturbances:   WNL  Orientation: oriented to person, place, and time/date  Attention: Good  Concentration: Fair  Memory: WNL  Fund of knowledge:  Good  Insight:   Good  Judgment:  Good  Impulse Control: Good   Risk Assessment: Danger to Self:  No Self-injurious Behavior: No Danger to Others: No Duty to Warn:no Physical Aggression / Violence:No  Access to Firearms a concern: No  Gang Involvement:No   Subjective:   Kiyla Ringler participated from car, via video and consented to treatment. Therapist participated from home office. I discussed the limitations of evaluation and management by telemedicine and the availability of in person appointments. The patient expressed understanding and agreed to proceed. Kindel reviewed the events of the past week.   Patient was in between various medical appointments throughout the day. Patient reported that she passed a kidney stone so she was in less pain. Patient was frustrated with her pain level. Patient was [facility] into [facility] on January 30, 2024.    We reviewed numerous treatment approaches includiMajor Depressive Disorder, recurrent severe, without psychosis  HCC (F33.2) ng CBT, BA, Problem Solving, and Solution focused therapy. Psych-education regarding the Jose's diagnosis of was provided during the session.   We discussed Lounell Latour's treatment goals which include:  Have someone to vent to every once in a while.  There's a Counselling psychologist at [facility] who patient is seeing. Patient will continue to see that Counselling psychologist. Patient will contact this writer through [phone] if patient needs additional help. Patient has contact information for additional resources.  Dealing with the fear of flashbacks in order to sleep through the night.  Managing your grief, to include people patient has lost in her family and that patient has lost this year.    Kaye Parsons provided verbal approval of the treatment plan.   Interventions: Psycho-education & Goal Setting.   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 Patient has good friends who she enjoys spending time with as often as possible.   Support System: friends Miller Allis, [place], [place])   Client Treatment Preferences OPT  Client Statement of Needs Angelin would like to get additional services.    Treatment Level Monthly  Symptoms  Anxiety: feeling nervous and on edge, not being able to control worrying, trouble relaxing, being so restless that it is hard to sit still, becoming easily annoyed or irritable. Depression: overeating, trouble concentrating. Patient previously had thoughts about hurting herself in some way, but denies feeling that way currently.   Goals:   [employer]  experiences symptoms of depression.   Treatment plan signed and available on s-drive:  No    Target Date: 01/26/25 Frequency: Monthly   Progress: 0 Modality: individual    Therapist will provide referrals for additional resources as appropriate.  Therapist will provide psycho-education regarding Adan's diagnosis and corresponding treatment approaches and interventions. Fran Imus will support the patient's ability to achieve the goals identified. will employ CBT, BA, Problem-solving, Solution Focused, Mindfulness,  coping skills, & other evidenced-based practices will be used to promote progress towards healthy functioning to help manage decrease symptoms associated with their diagnosis.   Reduce overall level, frequency, and intensity of the feelings of depression, anxiety and panic evidenced by decreased overall symptoms from 6 to 7 days/week to 0 to 1 days/week per client report for at least 3 consecutive months. Verbally express understanding of the relationship between feelings of depression and their impact on thinking patterns and behaviors. Verbalize an understanding of the role that distorted thinking plays in creating fears, excessive worry, and ruminations.    (Marieelena participated in the creation of the treatment plan)    Fran Imus

## 2024-02-20 NOTE — Discharge Instructions (Addendum)
 Please follow-up with your PCP regarding your enlarged lymph nodes.  Please go to the ER for any worsening symptoms.  I hope you feel better soon!

## 2024-02-21 ENCOUNTER — Encounter (HOSPITAL_BASED_OUTPATIENT_CLINIC_OR_DEPARTMENT_OTHER): Payer: Self-pay | Admitting: Emergency Medicine

## 2024-02-21 ENCOUNTER — Other Ambulatory Visit: Payer: Self-pay

## 2024-02-21 ENCOUNTER — Emergency Department (HOSPITAL_BASED_OUTPATIENT_CLINIC_OR_DEPARTMENT_OTHER)
Admission: EM | Admit: 2024-02-21 | Discharge: 2024-02-21 | Disposition: A | Discharge status: Home / Self Care | Attending: Emergency Medicine | Admitting: Emergency Medicine

## 2024-02-21 ENCOUNTER — Emergency Department (HOSPITAL_BASED_OUTPATIENT_CLINIC_OR_DEPARTMENT_OTHER): Admitting: Radiology

## 2024-02-21 ENCOUNTER — Emergency Department (HOSPITAL_BASED_OUTPATIENT_CLINIC_OR_DEPARTMENT_OTHER)

## 2024-02-21 DIAGNOSIS — R630 Anorexia: Secondary | ICD-10-CM | POA: Diagnosis not present

## 2024-02-21 DIAGNOSIS — R5383 Other fatigue: Secondary | ICD-10-CM | POA: Insufficient documentation

## 2024-02-21 DIAGNOSIS — Z87442 Personal history of urinary calculi: Secondary | ICD-10-CM | POA: Insufficient documentation

## 2024-02-21 DIAGNOSIS — R590 Localized enlarged lymph nodes: Secondary | ICD-10-CM | POA: Diagnosis not present

## 2024-02-21 DIAGNOSIS — R634 Abnormal weight loss: Secondary | ICD-10-CM | POA: Insufficient documentation

## 2024-02-21 DIAGNOSIS — R1084 Generalized abdominal pain: Secondary | ICD-10-CM | POA: Insufficient documentation

## 2024-02-21 DIAGNOSIS — M549 Dorsalgia, unspecified: Secondary | ICD-10-CM | POA: Diagnosis not present

## 2024-02-21 DIAGNOSIS — G8929 Other chronic pain: Secondary | ICD-10-CM | POA: Diagnosis not present

## 2024-02-21 DIAGNOSIS — R110 Nausea: Secondary | ICD-10-CM | POA: Diagnosis not present

## 2024-02-21 DIAGNOSIS — R319 Hematuria, unspecified: Secondary | ICD-10-CM | POA: Diagnosis not present

## 2024-02-21 LAB — CBC
HCT: 40.1 % (ref 36.0–46.0)
Hemoglobin: 13.7 g/dL (ref 12.0–15.0)
MCH: 30.9 pg (ref 26.0–34.0)
MCHC: 34.2 g/dL (ref 30.0–36.0)
MCV: 90.3 fL (ref 80.0–100.0)
Platelets: 235 10*3/uL (ref 150–400)
RBC: 4.44 MIL/uL (ref 3.87–5.11)
RDW: 13.1 % (ref 11.5–15.5)
WBC: 7.5 10*3/uL (ref 4.0–10.5)
nRBC: 0 % (ref 0.0–0.2)

## 2024-02-21 LAB — COMPREHENSIVE METABOLIC PANEL WITH GFR
ALT: 42 U/L (ref 0–44)
AST: 28 U/L (ref 15–41)
Albumin: 3.9 g/dL (ref 3.5–5.0)
Alkaline Phosphatase: 92 U/L (ref 38–126)
Anion gap: 11 (ref 5–15)
BUN: 14 mg/dL (ref 6–20)
CO2: 26 mmol/L (ref 22–32)
Calcium: 9.3 mg/dL (ref 8.9–10.3)
Chloride: 106 mmol/L (ref 98–111)
Creatinine, Ser: 0.95 mg/dL (ref 0.44–1.00)
GFR, Estimated: >60 mL/min (ref >60)
Glucose, Bld: 100 mg/dL — ABNORMAL HIGH (ref 70–99)
Potassium: 3.9 mmol/L (ref 3.5–5.1)
Sodium: 143 mmol/L (ref 135–145)
Total Bilirubin: 0.5 mg/dL (ref 0.0–1.2)
Total Protein: 6.3 g/dL — ABNORMAL LOW (ref 6.5–8.1)

## 2024-02-21 LAB — HCG, SERUM, QUALITATIVE: Preg, Serum: NEGATIVE

## 2024-02-21 LAB — TSH: TSH: 2.17 u[IU]/mL (ref 0.350–4.500)

## 2024-02-21 MED ORDER — LORAZEPAM 2 MG/ML IJ SOLN: 1.0000 mg | Freq: Once | INTRAMUSCULAR | Status: DC | PRN

## 2024-02-21 MED ORDER — LORAZEPAM 1 MG PO TABS
1.0000 mg | ORAL_TABLET | Freq: Once | ORAL | Status: AC
  Administered 2024-02-21: 1 mg via ORAL
  Filled 2024-02-21: qty 1

## 2024-02-21 MED ORDER — IOHEXOL 300 MG/ML  SOLN
75.0000 mL | Freq: Once | INTRAMUSCULAR | Status: AC | PRN
  Administered 2024-02-21: 75 mL via INTRAVENOUS

## 2024-02-21 NOTE — ED Triage Notes (Signed)
 Patient was diangosed with swollen lymph nodes recently and states it is getting worse. Complaining of fatigue, decreased appetite, leg cramps, not sleeping well, and nausea ongoing for 2 weeks. Patient also states she is having difficulty having a bowel movement. She states she is not constipated but "the muscles aren't working."

## 2024-02-21 NOTE — Discharge Instructions (Signed)
 The CAT scan showed normal lymph nodes.  The blood work was reassuring including her TSH.  Follow-up with your primary care doctor.

## 2024-02-21 NOTE — ED Provider Notes (Signed)
 [place] EMERGENCY DEPARTMENT AT [facility] Provider Note   CSN: [id number] Arrival date & time: 02/21/24  0608     History  Chief Complaint  Patient presents with   Multiple Complaints    Rebekah Davis is a 38 y.o. female.  HPI Patient presents with various complaints.  Swollen lymph nodes in the neck.  Is had for a while now.  Has seen PCP.  Had ultrasound done but no results yet.  States feels that is getting worse.  Also fatigue.  Also some weight loss.  Decreased appetite.  Also nausea.  Difficulty having bowel movement which is also chronic but has been going through pelvic floor training for it.  Also has had hematuria.  Had kidney stones.  Had CT scan to evaluate for it at that time.  No real abnormality.  Previous history of strep.  She has had tonsillectomy.  States she had swollen lymph nodes with that.  Also chronic back pain.   Past Medical History:  Diagnosis Date   Abdominal pain, suprapubic 06/04/2016   Abnormal Pap smear of cervix    Acute cystitis 03/10/2018   Anxiety    Arthritis    C. difficile diarrhea 06/12/2018   Cataract    Chest pain 03/28/2018   Depression    Depression with suicidal ideation 03/09/2018   Diarrhea 06/13/2018   GERD (gastroesophageal reflux disease)    History of kidney stones    Hypomagnesemia 06/13/2018   Kidney stone    Low back pain 07/22/2012   Migraine    Non-suicidal self harm as coping mechanism (HCC) 12/25/2019   Oral thrush 06/12/2018   Ovarian cyst    PCOS (polycystic ovarian syndrome)    PTSD (post-traumatic stress disorder)    Scarlet fever    Sleep apnea    Substance abuse (HCC)    Suicidal ideation 04/12/2018   Past Surgical History:  Procedure Laterality Date   BILATERAL SALPINGOECTOMY     LUMBAR LAMINECTOMY     RIGHT OOPHERECTOMY Right    TENOLYSIS Left 10/04/2020   Procedure: LEFT ACHILLES TENDON DEBRIDEMENT AND RECONSTRUCTION, CALCANEAL EXOSTECTOMY, GASTROCNEMIUS RECESSION,  FLEXOR  HALLUCIS LONGUS TRANSFER;  Surgeon: Donnamarie Gables, MD;  Location: [facility] OR;  Service: Orthopedics;  Laterality: Left;  LENGTH OF SURGERY: 1.5 HOURS   TONSILLECTOMY       Home Medications Prior to Admission medications   Medication Sig Start Date End Date Taking? Authorizing Provider  Acetaminophen  (TYLENOL  PO) Take by mouth.    [provider]  albuterol  (VENTOLIN  HFA) 108 (90 Base) MCG/ACT inhaler Inhale 2 puffs into the lungs as needed for wheezing or shortness of breath. 08/03/22   Versa Gore, NP  albuterol  (VENTOLIN  HFA) 108 (90 Base) MCG/ACT inhaler Inhale 1 puff into the lungs every 4 (four) hours as needed. 02/14/24     ARIPiprazole  (ABILIFY ) 5 MG tablet Take 1 tablet (5 mg total) by mouth at bedtime for mood. 12/28/23     cyclobenzaprine  (FLEXERIL ) 5 MG tablet Take 1 tablet (5 mg total) by mouth at bedtime as needed. 02/17/24     ergocalciferol  (VITAMIN D2) 1.25 MG (50000 UT) capsule Take 50,000 Units by mouth once a week. 06/06/23 06/05/24  [provider]  ergocalciferol  (VITAMIN D2) 1.25 MG (50000 UT) capsule Take 1 capsule (50,000 Units total) by mouth once a week. 10/23/23     escitalopram  (LEXAPRO ) 10 MG tablet Take 1 tablet (10 mg total) by mouth daily. 09/23/23     escitalopram  (LEXAPRO ) 5  MG tablet Take 1 tablet (5 mg total) by mouth daily. 09/23/23     hydrOXYzine  (ATARAX ) 10 MG tablet Take 1-2 tablets (10-20 mg total) by mouth daily as needed for anxiety. 02/18/24     hydrOXYzine  (ATARAX ) 25 MG tablet Take 1 tablet (25 mg total) by mouth 3 (three) times daily as needed for anxiety. 11/21/22   Tor Freed, FNP  IBUPROFEN  PO Take by mouth.    [provider]  lamoTRIgine  (LAMICTAL ) 100 MG tablet Take 1 tablet (100 mg total) by mouth 2 (two) times daily. 11/26/23   Phebe Brasil, MD  lamoTRIgine  (LAMICTAL ) 100 MG tablet Take 1 tablet (100 mg total) by mouth 2 (two) times daily. 11/26/23   Phebe Brasil, MD  lamoTRIgine  (LAMICTAL ) 25 MG tablet 1 twice a day  x1wk 2 twice a day x 2nd wk 3 twice a day x3rd wk 11/26/23   Phebe Brasil, MD  LORazepam  (ATIVAN ) 0.5 MG tablet Take 0.25-0.5 mg by mouth every 4 (four) hours as needed. 02/18/23   [provider]  LORazepam  (ATIVAN ) 0.5 MG tablet Take 1-2 tablets (0.5-1 mg total) by mouth every 4-6 hours as needed for severe panic attacks. 02/18/24     LORazepam  (ATIVAN ) 1 MG tablet Take 1-2 tablets 30 minutes prior to MRI, may repeat once as needed. Must have driver. 11/26/23   Phebe Brasil, MD  ondansetron  (ZOFRAN ) 8 MG tablet Take 1 tablet (8 mg total) by mouth every 8 (eight) hours as needed for nausea for 10 days. 07/18/23     phenazopyridine  (PYRIDIUM ) 100 MG tablet Take 1 tablet (100 mg total) by mouth 2 (two) times daily for Dysuria. 12/28/23     tamsulosin  (FLOMAX ) 0.4 MG CAPS capsule Take 1 capsule (0.4 mg total) by mouth daily. 01/24/24         Allergies    Adhesive [tape], Cherry, Sulfamethoxazole-trimethoprim, Levofloxacin, Morphine, Sulfa antibiotics, Tizanidine, Gabapentin, Metformin, and Sumatriptan    Review of Systems   Review of Systems  Physical Exam Updated Vital Signs BP 136/86   Pulse 94   Temp 97.9 F (36.6 C) (Oral)   Resp 20   Ht 5' 5.5" (1.664 m)   Wt (!) 146.5 kg   SpO2 100%   BMI 52.93 kg/m  Physical Exam Vitals reviewed.  Constitutional:      Appearance: She is obese.  HENT:     Head:     Comments: Potentially malar rash to face.    Mouth/Throat:     Comments: Some palpable lymph nodes on neck. Cardiovascular:     Rate and Rhythm: Normal rate.  Pulmonary:     Breath sounds: No wheezing or rhonchi.  Abdominal:     Comments: Mild diffuse tenderness.  Musculoskeletal:     Cervical back: Neck supple.  Skin:    General: Skin is warm.     Capillary Refill: Capillary refill takes less than 2 seconds.  Neurological:     Mental Status: She is alert and oriented to person, place, and time.     ED Results / Procedures / Treatments   Labs (all labs ordered are  listed, but only abnormal results are displayed) Labs Reviewed  COMPREHENSIVE METABOLIC PANEL WITH GFR - Abnormal; Notable for the following components:      Result Value   Glucose, Bld 100 (*)    Total Protein 6.3 (*)    All other components within normal limits  CBC  TSH  HCG, SERUM, QUALITATIVE    EKG None  Radiology CT Soft Tissue Neck W Contrast Result Date: 02/21/2024 CLINICAL DATA:  Lymphadenopathy EXAM: CT NECK WITH CONTRAST TECHNIQUE: Multidetector CT imaging of the neck was performed using the standard protocol following the bolus administration of intravenous contrast. RADIATION DOSE REDUCTION: This exam was performed according to the departmental dose-optimization program which includes automated exposure control, adjustment of the mA and/or kV according to patient size and/or use of iterative reconstruction technique. CONTRAST:  75mL OMNIPAQUE  IOHEXOL  300 MG/ML  SOLN COMPARISON:  Neck ultrasound from 3 days ago FINDINGS: Pharynx and larynx: No evidence of mass or thickening Salivary glands: No inflammation, mass, or stone. Thyroid : Normal Lymph nodes: No worrisome enlargement or heterogeneity. Vascular: Unremarkable Limited intracranial: Unremarkable Visualized orbits: Unremarkable Mastoids and visualized paranasal sinuses: Clear Skeleton: Unremarkable Upper chest: Clear apical lungs. IMPRESSION: Unremarkable exam. No worrisome heterogeneity or enlargement of cervical lymph nodes. Electronically Signed   By: Ronnette Coke M.D.   On: 02/21/2024 09:19   DG Chest 2 View Result Date: 02/21/2024 CLINICAL DATA:  Weakness EXAM: CHEST - 2 VIEW COMPARISON:  None Available. FINDINGS: The heart size and mediastinal contours are within normal limits. Both lungs are clear. The visualized skeletal structures are unremarkable. IMPRESSION: No active cardiopulmonary disease. Electronically Signed   By: Lore Rode M.D.   On: 02/21/2024 09:17    Procedures Procedures    Medications Ordered in  ED Medications  LORazepam  (ATIVAN ) tablet 1 mg (1 mg Oral Given 02/21/24 0747)  iohexol  (OMNIPAQUE ) 300 MG/ML solution 75 mL (75 mLs Intravenous Contrast Given 02/21/24 0981)    ED Course/ Medical Decision Making/ A&P                                 Medical Decision Making Amount and/or Complexity of Data Reviewed Labs: ordered. Radiology: ordered.  Risk Prescription drug management.   Patient with various complaints.  However does have swollen lymph nodes.  Had ultrasound done and did show the nodes after getting it read stat.  Blood work reassuring.  CT scan suggested an ultrasound report to further evaluate.  I think it is worth doing at this time.  Also will get chest x-ray due to weight loss and lymph nodes.  Also will get basic blood work.  CBC reassuring.  Ultrasound had been read as lymph nodes.  CT recommended.  CT done and showed no abnormal lymphadenopathy.  TSH also reassuring.  Workup overall reassuring.  Follow-up with PCP.        Final Clinical Impression(s) / ED Diagnoses Final diagnoses:  Fatigue, unspecified type    Rx / DC Orders ED Discharge Orders     None         Mozell Arias, MD 02/21/24 [phone]

## 2024-02-22 ENCOUNTER — Other Ambulatory Visit (HOSPITAL_BASED_OUTPATIENT_CLINIC_OR_DEPARTMENT_OTHER): Payer: Self-pay

## 2024-02-23 ENCOUNTER — Other Ambulatory Visit (HOSPITAL_COMMUNITY): Payer: Self-pay

## 2024-02-25 ENCOUNTER — Encounter (HOSPITAL_COMMUNITY): Payer: Self-pay | Admitting: Pharmacy Technician

## 2024-02-25 ENCOUNTER — Other Ambulatory Visit: Payer: Self-pay

## 2024-02-25 ENCOUNTER — Emergency Department (HOSPITAL_COMMUNITY)
Admission: EM | Admit: 2024-02-25 | Discharge: 2024-02-26 | Disposition: A | Discharge status: Other Acute Inpatient Hospital | Attending: Student | Admitting: Student

## 2024-02-25 DIAGNOSIS — R440 Auditory hallucinations: Secondary | ICD-10-CM | POA: Insufficient documentation

## 2024-02-25 DIAGNOSIS — Z7289 Other problems related to lifestyle: Secondary | ICD-10-CM

## 2024-02-25 DIAGNOSIS — F603 Borderline personality disorder: Secondary | ICD-10-CM | POA: Diagnosis present

## 2024-02-25 DIAGNOSIS — S51812A Laceration without foreign body of left forearm, initial encounter: Secondary | ICD-10-CM | POA: Insufficient documentation

## 2024-02-25 DIAGNOSIS — X789XXA Intentional self-harm by unspecified sharp object, initial encounter: Secondary | ICD-10-CM | POA: Insufficient documentation

## 2024-02-25 DIAGNOSIS — F29 Unspecified psychosis not due to a substance or known physiological condition: Secondary | ICD-10-CM

## 2024-02-25 DIAGNOSIS — S51811A Laceration without foreign body of right forearm, initial encounter: Secondary | ICD-10-CM | POA: Insufficient documentation

## 2024-02-25 DIAGNOSIS — D72829 Elevated white blood cell count, unspecified: Secondary | ICD-10-CM | POA: Diagnosis not present

## 2024-02-25 DIAGNOSIS — F39 Unspecified mood [affective] disorder: Secondary | ICD-10-CM | POA: Diagnosis present

## 2024-02-25 DIAGNOSIS — R45851 Suicidal ideations: Secondary | ICD-10-CM

## 2024-02-25 LAB — RAPID URINE DRUG SCREEN, HOSP PERFORMED
Amphetamines: NOT DETECTED
Barbiturates: NOT DETECTED
Benzodiazepines: NOT DETECTED
Cocaine: NOT DETECTED
Opiates: NOT DETECTED
Tetrahydrocannabinol: NOT DETECTED

## 2024-02-25 LAB — COMPREHENSIVE METABOLIC PANEL WITH GFR
ALT: 43 U/L (ref 0–44)
AST: 29 U/L (ref 15–41)
Albumin: 4 g/dL (ref 3.5–5.0)
Alkaline Phosphatase: 75 U/L (ref 38–126)
Anion gap: 14 (ref 5–15)
BUN: 11 mg/dL (ref 6–20)
CO2: 22 mmol/L (ref 22–32)
Calcium: 9.3 mg/dL (ref 8.9–10.3)
Chloride: 104 mmol/L (ref 98–111)
Creatinine, Ser: 0.88 mg/dL (ref 0.44–1.00)
GFR, Estimated: >60 mL/min (ref >60)
Glucose, Bld: 129 mg/dL — ABNORMAL HIGH (ref 70–99)
Potassium: 3.5 mmol/L (ref 3.5–5.1)
Sodium: 140 mmol/L (ref 135–145)
Total Bilirubin: 1 mg/dL (ref 0.0–1.2)
Total Protein: 7.3 g/dL (ref 6.5–8.1)

## 2024-02-25 LAB — CBC
HCT: 46.1 % — ABNORMAL HIGH (ref 36.0–46.0)
Hemoglobin: 15.6 g/dL — ABNORMAL HIGH (ref 12.0–15.0)
MCH: 31 pg (ref 26.0–34.0)
MCHC: 33.8 g/dL (ref 30.0–36.0)
MCV: 91.5 fL (ref 80.0–100.0)
Platelets: 313 10*3/uL (ref 150–400)
RBC: 5.04 MIL/uL (ref 3.87–5.11)
RDW: 12.8 % (ref 11.5–15.5)
WBC: 11 10*3/uL — ABNORMAL HIGH (ref 4.0–10.5)
nRBC: 0 % (ref 0.0–0.2)

## 2024-02-25 LAB — HCG, SERUM, QUALITATIVE: Preg, Serum: NEGATIVE

## 2024-02-25 LAB — SALICYLATE LEVEL: Salicylate Lvl: <7 mg/dL — ABNORMAL LOW (ref 7.0–30.0)

## 2024-02-25 LAB — ETHANOL: Alcohol, Ethyl (B): <15 mg/dL (ref <15)

## 2024-02-25 LAB — ACETAMINOPHEN LEVEL: Acetaminophen (Tylenol), Serum: <10 ug/mL — ABNORMAL LOW (ref 10–30)

## 2024-02-25 MED ORDER — HYDROXYZINE HCL 25 MG PO TABS
25.0000 mg | ORAL_TABLET | Freq: Three times a day (TID) | ORAL | Status: DC | PRN
  Administered 2024-02-26: 25 mg via ORAL
  Filled 2024-02-25: qty 1

## 2024-02-25 MED ORDER — ARIPIPRAZOLE 10 MG PO TABS
10.0000 mg | ORAL_TABLET | Freq: Every day | ORAL | Status: DC
  Administered 2024-02-25: 10 mg via ORAL
  Filled 2024-02-25: qty 1

## 2024-02-25 MED ORDER — ESCITALOPRAM OXALATE 10 MG PO TABS: 10.0000 mg | ORAL_TABLET | Freq: Every day | ORAL | Status: DC

## 2024-02-25 MED ORDER — ACETAMINOPHEN 325 MG PO TABS
650.0000 mg | ORAL_TABLET | Freq: Once | ORAL | Status: AC
  Administered 2024-02-25: 650 mg via ORAL
  Filled 2024-02-25: qty 2

## 2024-02-25 MED ORDER — BACITRACIN ZINC 500 UNIT/GM EX OINT
TOPICAL_OINTMENT | Freq: Two times a day (BID) | CUTANEOUS | Status: DC
  Administered 2024-02-25 (×2): 1 via TOPICAL
  Filled 2024-02-25: qty 0.9
  Filled 2024-02-25: qty 1.8

## 2024-02-25 MED ORDER — IBUPROFEN 400 MG PO TABS: 600.0000 mg | ORAL_TABLET | Freq: Three times a day (TID) | ORAL | Status: DC | PRN

## 2024-02-25 NOTE — TOC Initial Note (Signed)
 Transition of Care [facility]) - Initial/Assessment Note    Patient Details  Name: Rebekah Davis MRN: [id number] Date of Birth: 1985-12-15  [facility] CM/SW Contact:    Valley Gavia, LCSWA Phone Number: 02/25/2024, 6:42 PM  Clinical Narrative:                  Inpatient Psychiatric Referral   Patient was recommended inpatient per Roise Cleaver, NP . There are no available beds at [facility], per [facility]. Patient was referred to the following out of network facilities:   [facility] Pending - Request Sent -- [address] [place] [address] [phone] --  [facility] Pending - Request Sent -- [address], [place] [address] [phone] --  [facility] Pending - Request Sent -- [address] [place] [address] [phone] --  [facility] Pending - Request Sent -- [address] [place] [address] [phone] --  [facility] Pending - Request Sent -- [place] [phone] --  [facility] Pending - Request Sent -- [address] [place] [address] [phone] --  [facility] Pending - Request Sent -- [address], [place] [address] [phone] --  [facility] Pending - Request Sent -- [address] [place] [address] [phone] --  [facility] Pending - Request Sent -- [address] [place] [phone] --  [facility] Pending - Request Sent -- [address], [place] [address] [phone] --  [facility] Pending - Request Sent -- [address], [place] [address] [phone] --  [facility] Pending - Request Sent -- [address], [place] [address] [phone] --  [facility] Pending - Request Sent -- [address], [place] [address] [phone] --  [facility] Pending - Request Sent -- [address] [place] [address] [phone] --  [facility] Pending - Request Sent -- [address] [place], [place] [address] [phone] --  [facility] Pending - Request Sent -- [address], [place] [address] [phone] --  [facility] Pending - Request Sent -- [facility], [place] [phone] --  [facility] Pending - Request Sent -- [address], [place] [address] [phone] --  [facility] Pending - Request Sent -- [address] [place], [place] [address] [phone] --  [facility] Pending - Request Sent -- [address] [place], [place] [address] [phone] --  [facility] Pending - Request Sent -- [address] Na[address] [place] [address] [phone] --           Patient Goals and CMS Choice            Expected Discharge Plan and Services                                              Prior Living Arrangements/Services  Activities of Daily Living      Permission Sought/Granted                  Emotional Assessment              Admission diagnosis:  Z04.6 Patient Active Problem List   Diagnosis Date Noted   Psychosis (HCC) 02/25/2024   Seizure-like activity (HCC) 11/26/2023   Passive suicidal ideations 11/16/2020   Irregular bleeding 04/12/2020   Achilles  tendinitis 03/11/2020   Ovarian cyst 02/19/2020   Deliberate self-cutting 01/23/2020   Transaminitis 06/13/2018   Morbid obesity (HCC) 03/28/2018   GERD (gastroesophageal reflux disease) 03/10/2018   Mood disorder (HCC) 03/09/2018   PTSD (post-traumatic stress disorder) 03/09/2018   MDD (major depressive disorder), recurrent severe, 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/2018   Polycystic disease, ovaries 07/05/2016   Spondylolisthesis 04/20/2015   Herniated lumbar intervertebral disc 02/28/2013   Sciatica of left side 02/28/2013   Borderline personality disorder (HCC) 12/29/2012   Anxiety 12/24/2012   Essential hypertension 12/24/2012   PCP:  Olin Bertin, MD Pharmacy:   [facility] [id number] - [place], [place] - [address] [place] [address] Phone: [phone] Fax: [phone]  [facility] [address] [place] [address] Phone: [phone] Fax: [phone]     Social Drivers of Health (SDOH) Social History: SDOH Screenings   Food Insecurity: Low Risk  (01/04/2024)   Received from [facility]  Housing: High Risk (02/04/2024)  Transportation Needs: No Transportation Needs (01/04/2024)   Received from [facility]  Utilities: Low Risk  (01/04/2024)   Received from [facility]  Depression (PHQ2-9): Low Risk  (02/06/2023)  Financial Resource Strain: Low Risk  (07/10/2022)  Physical Activity: Insufficiently Active (07/10/2022)  Social Connections: Moderately Isolated (07/10/2022)  Stress: No Stress Concern Present (07/10/2022)  Tobacco Use: Medium Risk (02/25/2024)   SDOH Interventions:     Readmission Risk Interventions     No data to display

## 2024-02-25 NOTE — ED Notes (Signed)
 Pt given ice pack for the arm

## 2024-02-25 NOTE — ED Notes (Signed)
Family taking belongings home

## 2024-02-25 NOTE — ED Notes (Signed)
 Ointment applied to pt arm and pt arm wrapped with roller gauze

## 2024-02-25 NOTE — Consult Note (Cosign Needed Addendum)
 [facility] Psychiatric Consult Initial  Patient Name: .Rebekah Davis  MRN: [id number]  DOB: [birth year]  Consult Order details:  Orders (From admission, onward)     Start     Ordered   02/25/24 1649  CONSULT TO CALL ACT TEAM       Ordering Provider: Dickson Founds, PA-C  Provider:  (Not yet assigned)  Question:  Reason for Consult?  Answer:  SI   02/25/24 1648             Mode of Visit: In person    Psychiatry Consult Evaluation  Service Date: Feb 25, 2024 LOS:  LOS: 0 days  Chief Complaint SIB, possible psychosis  Primary Psychiatric Diagnoses  psychosis 2.  Borderline personality disorder 3.  MDD  Assessment  Rebekah Davis is a 38 y.o. female admitted: Presented to the EDfor 02/25/2024  2:36 PM for self harming on both forearms.   Please see plan below for detailed recommendations.   Diagnoses:  Active Hospital problems: Principal Problem:   Psychosis (HCC) Active Problems:   Borderline personality disorder (HCC)   Mood disorder (HCC)    Plan   ## Psychiatric Medication Recommendations:  - Start Lexapro  10 mg daily - Start Abilify  10 mg daily  ## Medical Decision Making Capacity: Not specifically addressed in this encounter  ## Further Work-up:  EKG, UDS pending  ## Disposition:-- We recommend inpatient psychiatric hospitalization when medically cleared. Patient is under voluntary admission status at this time; please IVC if attempts to leave hospital.  ## Behavioral / Environmental: - No specific recommendations at this time.     ## Safety and Observation Level:  - Based on my clinical evaluation, I estimate the patient to be at low risk of self harm in the current setting. - At this time, we recommend  routine. This decision is based on my review of the chart including patient's history and current presentation, interview of the patient, mental status examination, and consideration of suicide risk including evaluating suicidal ideation, plan,  intent, suicidal or self-harm behaviors, risk factors, and protective factors. This judgment is based on our ability to directly address suicide risk, implement suicide prevention strategies, and develop a safety plan while the patient is in the clinical setting. Please contact our team if there is a concern that risk level has changed.  CSSR Risk Category:C-SSRS RISK CATEGORY: Low Risk  Suicide Risk Assessment: Patient has following modifiable risk factors for suicide: under treated depression , recklessness, and medication noncompliance, which we are addressing by inpatient admission. Patient has following non-modifiable or demographic risk factors for suicide: history of suicide attempt, history of self harm behavior, and psychiatric hospitalization Patient has the following protective factors against suicide: Access to outpatient mental health care, Supportive friends, and Cultural, spiritual, or religious beliefs that discourage suicide  Thank you for this consult request. Recommendations have been communicated to the primary team.  We will recommend IP admission at this time.   Roise Cleaver, NP       History of Present Illness  Relevant Aspects of Hospital ED Course:  Admitted on 02/25/2024 Rebekah Davis is a 38 y.o. female here for evaluation of suicide attempt.  Has a history of borderline personality disorder, mood disorder, depression, prior SI who is here for 6 ideation.  Mother states patient has had worsening suicidal thoughts.  Her "parts" are telling her to harm herself.  Yesterday evening she lacerated the dorsal aspect of both of her forearms.  Her tetanus is  up-to-date.  She states the voices are telling her to harm herself.  Mother denies any recent medication changes.  She is compliant with her medications at home.  She has had inpatient psychiatric admissions previously.  She denies any pain, weakness, fever, nausea or vomiting.  Mother has been dressing her wounds with  Neosporin.   Patient Report:  [facility] seen at [facility] ED for face-to-face psychiatric evaluation.  Patient is acting confused, she is also speaking in a baby like tone.  She claims to not know the current year or the current president.  She states she knows her name but does not want to tell me.  She does tell me her correct birthday.  Patient states she does not member cutting herself, it was not her.  Patient denies SI/HI/AVH.  Patient states "the stuff today wasn't me."  She is a poor historian, she chooses to not participate meaningfully in the assessment.  Her friend is present with her and is able to give me more information about what happened today.  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 She states the patient did lose her jobs, and stopped taking medications around Nov 23, 2023 of this year. Pt has somewhere to live, but often stays with this friend due to not wanting to be alone. Friend states she walked in on the patient using a scraper and an old razor to cut her forearms today. She does feel like the patient needs treatment.   Pt is able to state she previous took lexapro  and abilify  and felt that was a good combo for her. She then makes a bizarre statement stating the abilify  might make her float through the ceiling and she doesn't want that. Also mentions she died in 11-23-23 and that's why she stopped taking her medications.   Unclear at this time if patient is experiencing acute psychosis, or if this is more behavioral/cluster B traits. Will restart pt on Abilify  and Lexparo since she reported a success history with these two. Will recommend inpatient psychiatric treatment for further stabilization.     Review of Systems  Psychiatric/Behavioral:  Positive for depression and suicidal ideas.        SIB  All other systems reviewed and are  negative.    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 Current Psych Provider: yes Home Meds (current): lexapro  abilify  Previous Med Trials: unknown Therapy: denies  Prior Psych Hospitalization: yes, march 2025  Prior Self Harm: yes Prior Violence: denies  Access to weapons/lethal means: denies   Substance History Alcohol: denies  Illicit drugs: denies   Exam Findings  Physical Exam:  Vital Signs:  Temp:  [98.2 F (36.8 C)] 98.2 F (36.8 C) (05/13 1442) Pulse Rate:  [88-125] 88 (05/13 1711) Resp:  [18] 18 (05/13 1711) BP: (105-149)/(78-81) 105/78 (05/13 1711) SpO2:  [100 %] 100 % (05/13 1711) Blood pressure 105/78, pulse 88, temperature 98.2 F (36.8 C), resp. rate 18, SpO2 100%. There is no height or weight on file to calculate BMI.  Physical Exam Vitals and nursing note reviewed.  Neurological:     Mental Status: She is alert. She is disoriented.     Mental Status Exam: General Appearance: Disheveled  Orientation:  Other:  oriented to self and  DOB  Memory:  Immediate;   Fair Recent;   Fair  Concentration:  Concentration: Fair  Recall:  Poor  Attention  Poor  Eye Contact:  Good  Speech:  Clear and Coherent  Language:  Good  Volume:  Normal  Mood: "doing okay"  Affect:  Appropriate  Thought Process:  Goal Directed  Thought Content:  Illogical  Suicidal Thoughts:  No  Homicidal Thoughts:  No  Judgement:  Impaired  Insight:  Lacking  Psychomotor Activity:  Normal  Akathisia:  No  Fund of Knowledge:  Fair      Assets:  Communication Skills Desire for Improvement Housing Leisure Time Physical Health Social Support  Cognition:  WNL  ADL's:  Intact  AIMS (if indicated):        Other History   These have been pulled in through the EMR, reviewed, and updated if appropriate.  Family History:   The patient's family history includes Depression in her mother; Diabetes in her father and mother; Heart disease in her paternal grandmother; Hypertension in her father and mother; Kidney disease in her mother; Other in her maternal grandmother; Ovarian cancer in her maternal grandmother; Stroke in her paternal grandfather; Thyroid  disease in her mother.  Medical History: Past Medical History:  Diagnosis Date  . Abdominal pain, suprapubic 06/04/2016  . Abnormal Pap smear of cervix   . Acute cystitis 03/10/2018  . Anxiety   . Arthritis   . C. difficile diarrhea 06/12/2018  . Cataract   . Chest pain 03/28/2018  . Depression   . Depression with suicidal ideation 03/09/2018  . Diarrhea 06/13/2018  . GERD (gastroesophageal reflux disease)   . History of kidney stones   . Hypomagnesemia 06/13/2018  . Kidney stone   . Low back pain 07/22/2012  . Migraine   . Non-suicidal self harm as coping mechanism (HCC) 12/25/2019  . Oral thrush 06/12/2018  . Ovarian cyst   . PCOS (polycystic ovarian syndrome)   . PTSD (post-traumatic stress disorder)   . Scarlet fever   . Sleep apnea   . Substance abuse (HCC)   . Suicidal ideation 04/12/2018    Surgical History: Past Surgical History:  Procedure Laterality Date  . BILATERAL SALPINGOECTOMY    . LUMBAR LAMINECTOMY    . RIGHT OOPHERECTOMY Right   . TENOLYSIS Left 10/04/2020   Procedure: LEFT ACHILLES TENDON DEBRIDEMENT AND RECONSTRUCTION, CALCANEAL EXOSTECTOMY, GASTROCNEMIUS RECESSION,  FLEXOR HALLUCIS LONGUS TRANSFER;  Surgeon: Donnamarie Gables, MD;  Location: [facility] OR;  Service: Orthopedics;  Laterality: Left;  LENGTH OF SURGERY: 1.5 HOURS  . TONSILLECTOMY       Medications:   Current Facility-Administered Medications:  .  bacitracin ointment, , Topical, BID, Henderly, Britni A, PA-C, 1 Application at 02/25/24 1741 .  ibuprofen  (ADVIL ) tablet 600 mg, 600 mg, Oral, Q8H PRN, Henderly, Britni A, PA-C  Current Outpatient Medications:  .   Acetaminophen  (TYLENOL  PO), Take by mouth., Disp: , Rfl:  .  albuterol  (VENTOLIN  HFA) 108 (90 Base) MCG/ACT inhaler, Inhale 2 puffs into the lungs as needed for wheezing or shortness of breath., Disp: 6.7 g, Rfl: 1 .  albuterol  (VENTOLIN  HFA) 108 (90 Base) MCG/ACT inhaler, Inhale 1 puff into the lungs every 4 (four) hours as needed., Disp: 18 g, Rfl: 3 .  ARIPiprazole  (ABILIFY ) 5 MG tablet, Take 1 tablet (5 mg total) by mouth at bedtime for mood., Disp: 14 tablet, Rfl: 0 .  cyclobenzaprine  (FLEXERIL ) 5 MG tablet, Take 1 tablet (5 mg total) by mouth at  bedtime as needed., Disp: 14 tablet, Rfl: 0 .  ergocalciferol  (VITAMIN D2) 1.25 MG (50000 UT) capsule, Take 50,000 Units by mouth once a week., Disp: , Rfl:  .  ergocalciferol  (VITAMIN D2) 1.25 MG (50000 UT) capsule, Take 1 capsule (50,000 Units total) by mouth once a week., Disp: 4 capsule, Rfl: 2 .  escitalopram  (LEXAPRO ) 10 MG tablet, Take 1 tablet (10 mg total) by mouth daily., Disp: 30 tablet, Rfl: 0 .  escitalopram  (LEXAPRO ) 5 MG tablet, Take 1 tablet (5 mg total) by mouth daily., Disp: 30 tablet, Rfl: 0 .  hydrOXYzine  (ATARAX ) 10 MG tablet, Take 1-2 tablets (10-20 mg total) by mouth daily as needed for anxiety., Disp: 60 tablet, Rfl: 0 .  hydrOXYzine  (ATARAX ) 25 MG tablet, Take 1 tablet (25 mg total) by mouth 3 (three) times daily as needed for anxiety., Disp: 30 tablet, Rfl: 0 .  IBUPROFEN  PO, Take by mouth., Disp: , Rfl:  .  lamoTRIgine  (LAMICTAL ) 100 MG tablet, Take 1 tablet (100 mg total) by mouth 2 (two) times daily., Disp: 60 tablet, Rfl: 11 .  lamoTRIgine  (LAMICTAL ) 100 MG tablet, Take 1 tablet (100 mg total) by mouth 2 (two) times daily., Disp: 60 tablet, Rfl: 11 .  lamoTRIgine  (LAMICTAL ) 25 MG tablet, 1 twice a day x1wk 2 twice a day x 2nd wk 3 twice a day x3rd wk, Disp: 84 tablet, Rfl: 0 .  LORazepam  (ATIVAN ) 0.5 MG tablet, Take 0.25-0.5 mg by mouth every 4 (four) hours as needed., Disp: , Rfl:  .  LORazepam  (ATIVAN ) 0.5 MG tablet,  Take 1-2 tablets (0.5-1 mg total) by mouth every 4-6 hours as needed for severe panic attacks., Disp: 10 tablet, Rfl: 2 .  LORazepam  (ATIVAN ) 1 MG tablet, Take 1-2 tablets 30 minutes prior to MRI, may repeat once as needed. Must have driver., Disp: 3 tablet, Rfl: 0 .  ondansetron  (ZOFRAN ) 8 MG tablet, Take 1 tablet (8 mg total) by mouth every 8 (eight) hours as needed for nausea for 10 days., Disp: 30 tablet, Rfl: 1 .  phenazopyridine  (PYRIDIUM ) 100 MG tablet, Take 1 tablet (100 mg total) by mouth 2 (two) times daily for Dysuria., Disp: 28 tablet, Rfl: 0 .  tamsulosin  (FLOMAX ) 0.4 MG CAPS capsule, Take 1 capsule (0.4 mg total) by mouth daily., Disp: 30 capsule, Rfl: 0  Allergies: Allergies  Allergen Reactions  . Adhesive [Tape] Other (See Comments)    blister  . Cherry Anaphylaxis and Rash  . Sulfamethoxazole-Trimethoprim Hives  . Levofloxacin Other (See Comments)    Muscle Pain  . Morphine Other (See Comments)    Feels like unable to breath or swallow   . Sulfa Antibiotics Hives  . Tizanidine Diarrhea  . Gabapentin Rash  . Metformin Nausea And Vomiting and Other (See Comments)    Diaphoresis   . Sumatriptan Diarrhea, Nausea And Vomiting and Other (See Comments)    Decreased heart rate and respiratory rate      Roise Cleaver, NP

## 2024-02-25 NOTE — ED Triage Notes (Signed)
 Pt here with reports of SI. Family states her coparts are wanting her dead as well. No plan noted by pt. Pt endorses pain everywhere.

## 2024-02-25 NOTE — ED Provider Notes (Signed)
 [place] EMERGENCY DEPARTMENT AT [facility] Provider Note   CSN: [id number] Arrival date & time: 02/25/24  1433     History  Chief Complaint  Patient presents with   Psychiatric Evaluation    Rebekah Davis is a 38 y.o. female here for evaluation of suicide attempt.  Has a history of borderline personality disorder, mood disorder, depression, prior SI who is here for 6 ideation.  Mother states patient has had worsening suicidal thoughts.  Her "parts" are telling her to harm herself.  Yesterday evening she lacerated the dorsal aspect of both of her forearms.  Her tetanus is up-to-date.  She states the voices are telling her to harm herself.  Mother denies any recent medication changes.  She is compliant with her medications at home.  She has had inpatient psychiatric admissions previously.  She denies any pain, weakness, fever, nausea or vomiting.  Mother has been dressing her wounds with Neosporin.  HPI     Home Medications Prior to Admission medications   Medication Sig Start Date End Date Taking? Authorizing Provider  Acetaminophen  (TYLENOL  PO) Take by mouth.    [provider]  albuterol  (VENTOLIN  HFA) 108 (90 Base) MCG/ACT inhaler Inhale 2 puffs into the lungs as needed for wheezing or shortness of breath. 08/03/22   Versa Gore, NP  albuterol  (VENTOLIN  HFA) 108 (90 Base) MCG/ACT inhaler Inhale 1 puff into the lungs every 4 (four) hours as needed. 02/14/24     ARIPiprazole  (ABILIFY ) 5 MG tablet Take 1 tablet (5 mg total) by mouth at bedtime for mood. 12/28/23     cyclobenzaprine  (FLEXERIL ) 5 MG tablet Take 1 tablet (5 mg total) by mouth at bedtime as needed. 02/17/24     ergocalciferol  (VITAMIN D2) 1.25 MG (50000 UT) capsule Take 50,000 Units by mouth once a week. 06/06/23 06/05/24  [provider]  ergocalciferol  (VITAMIN D2) 1.25 MG (50000 UT) capsule Take 1 capsule (50,000 Units total) by mouth once a week. 10/23/23     escitalopram  (LEXAPRO ) 10 MG  tablet Take 1 tablet (10 mg total) by mouth daily. 09/23/23     escitalopram  (LEXAPRO ) 5 MG tablet Take 1 tablet (5 mg total) by mouth daily. 09/23/23     hydrOXYzine  (ATARAX ) 10 MG tablet Take 1-2 tablets (10-20 mg total) by mouth daily as needed for anxiety. 02/18/24     hydrOXYzine  (ATARAX ) 25 MG tablet Take 1 tablet (25 mg total) by mouth 3 (three) times daily as needed for anxiety. 11/21/22   Tor Freed, FNP  IBUPROFEN  PO Take by mouth.    [provider]  lamoTRIgine  (LAMICTAL ) 100 MG tablet Take 1 tablet (100 mg total) by mouth 2 (two) times daily. 11/26/23   Phebe Brasil, MD  lamoTRIgine  (LAMICTAL ) 100 MG tablet Take 1 tablet (100 mg total) by mouth 2 (two) times daily. 11/26/23   Phebe Brasil, MD  lamoTRIgine  (LAMICTAL ) 25 MG tablet 1 twice a day x1wk 2 twice a day x 2nd wk 3 twice a day x3rd wk 11/26/23   Phebe Brasil, MD  LORazepam  (ATIVAN ) 0.5 MG tablet Take 0.25-0.5 mg by mouth every 4 (four) hours as needed. 02/18/23   [provider]  LORazepam  (ATIVAN ) 0.5 MG tablet Take 1-2 tablets (0.5-1 mg total) by mouth every 4-6 hours as needed for severe panic attacks. 02/18/24     LORazepam  (ATIVAN ) 1 MG tablet Take 1-2 tablets 30 minutes prior to MRI, may repeat once as needed. Must have driver. 11/26/23   Gracie Lav,  Murvin Arthurs, MD  ondansetron  (ZOFRAN ) 8 MG tablet Take 1 tablet (8 mg total) by mouth every 8 (eight) hours as needed for nausea for 10 days. 07/18/23     phenazopyridine  (PYRIDIUM ) 100 MG tablet Take 1 tablet (100 mg total) by mouth 2 (two) times daily for Dysuria. 12/28/23     tamsulosin  (FLOMAX ) 0.4 MG CAPS capsule Take 1 capsule (0.4 mg total) by mouth daily. 01/24/24         Allergies    Adhesive [tape], Cherry, Sulfamethoxazole-trimethoprim, Levofloxacin, Morphine, Sulfa antibiotics, Tizanidine, Gabapentin, Metformin, and Sumatriptan    Review of Systems   Review of Systems  Constitutional: Negative.   HENT: Negative.    Respiratory: Negative.    Cardiovascular: Negative.    Gastrointestinal: Negative.   Genitourinary: Negative.   Musculoskeletal: Negative.   Skin:  Positive for wound.  Neurological: Negative.   Psychiatric/Behavioral:  Positive for behavioral problems, self-injury, sleep disturbance and suicidal ideas. Negative for confusion. The patient is nervous/anxious.   All other systems reviewed and are negative.   Physical Exam Updated Vital Signs BP 105/78 (BP Location: Left Arm)   Pulse 88   Temp 98.2 F (36.8 C)   Resp 18   SpO2 100%  Physical Exam Vitals and nursing note reviewed.  Constitutional:      General: She is not in acute distress.    Appearance: She is well-developed. She is not ill-appearing, toxic-appearing or diaphoretic.  HENT:     Head: Atraumatic.     Nose: Nose normal.     Mouth/Throat:     Mouth: Mucous membranes are moist.  Eyes:     Pupils: Pupils are equal, round, and reactive to light.  Cardiovascular:     Rate and Rhythm: Normal rate.     Pulses: Normal pulses.     Heart sounds: Normal heart sounds.  Pulmonary:     Effort: Pulmonary effort is normal. No respiratory distress.     Breath sounds: Normal breath sounds.  Abdominal:     General: Bowel sounds are normal. There is no distension.     Palpations: Abdomen is soft.     Tenderness: There is no abdominal tenderness. There is no guarding or rebound.  Musculoskeletal:        General: Normal range of motion.     Cervical back: Normal range of motion.     Comments: No bony tenderness, compartments soft.  Skin:    General: Skin is warm and dry.     Capillary Refill: Capillary refill takes less than 2 seconds.     Comments: Multiple superficial lacerations dorsum bilateral forearms.  No lacerations to suture.  No surrounding erythema or warmth.  Neurological:     General: No focal deficit present.     Mental Status: She is alert.  Psychiatric:        Attention and Perception: She perceives auditory hallucinations.        Mood and Affect: Mood normal.         Behavior: Behavior is cooperative.        Thought Content: Thought content includes suicidal ideation. Thought content does not include homicidal ideation. Thought content does not include homicidal plan.     Comments: Flat affect.  Denies HI.  Admits to auditory hallucinations.     ED Results / Procedures / Treatments   Labs (all labs ordered are listed, but only abnormal results are displayed) Labs Reviewed  COMPREHENSIVE METABOLIC PANEL WITH GFR - Abnormal; Notable for the following  components:      Result Value   Glucose, Bld 129 (*)    All other components within normal limits  CBC - Abnormal; Notable for the following components:   WBC 11.0 (*)    Hemoglobin 15.6 (*)    HCT 46.1 (*)    All other components within normal limits  ACETAMINOPHEN  LEVEL - Abnormal; Notable for the following components:   Acetaminophen  (Tylenol ), Serum <10 (*)    All other components within normal limits  SALICYLATE LEVEL - Abnormal; Notable for the following components:   Salicylate Lvl <7.0 (*)    All other components within normal limits  ETHANOL  HCG, SERUM, QUALITATIVE  RAPID URINE DRUG SCREEN, HOSP PERFORMED    EKG None  Radiology No results found.  Procedures Wound closure utilizing adhes only  Date/Time: 02/25/2024 5:05 PM  Performed by: Dickson Founds, PA-C Authorized by: Dickson Founds, PA-C  Consent: Verbal consent obtained. Written consent not obtained. Risks and benefits: risks, benefits and alternatives were discussed Consent given by: patient and parent Patient understanding: patient states understanding of the procedure being performed Patient consent: the patient's understanding of the procedure matches consent given Procedure consent: procedure consent matches procedure scheduled Relevant documents: relevant documents present and verified Test results: test results available and properly labeled Site marked: the operative site was marked Imaging  studies: imaging studies available Required items: required blood products, implants, devices, and special equipment available Patient identity confirmed: verbally with patient Time out: Immediately prior to procedure a "time out" was called to verify the correct patient, procedure, equipment, support staff and site/side marked as required. Preparation: Patient was prepped and draped in the usual sterile fashion. Local anesthesia used: no  Anesthesia: Local anesthesia used: no  Sedation: Patient sedated: no  Patient tolerance: patient tolerated the procedure well with no immediate complications       Medications Ordered in ED Medications  bacitracin ointment (1 Application Topical Given 02/25/24 1741)  ibuprofen  (ADVIL ) tablet 600 mg (has no administration in time range)  acetaminophen  (TYLENOL ) tablet 650 mg (650 mg Oral Given 02/25/24 1733)    ED Course/ Medical Decision Making/ A&P   38 year old history of depression, borderline personality disorder here for evaluation of suicidal thoughts.  She states her "co-parts" are telling her to harm herself.  She has multiple superficial lacerations to the dorsum of her bilateral forearms.  Admits to SI with plan to slit herself.  No HI.  Wounds do not appear actively infected.  Her tetanus is up-to-date.  Initially tachycardic however improves when she lays down is more cooperative.  Wounds were dressed and cleaned by myself  Labs personally viewed and interpreted:  CBC leukocytosis 11.0 Metabolic panel without significant abnormality Pregnancy test negative Tylenol , salicylate, ethanol within normal limits  Patient medically cleared   Consult TTS, disposition per psychiatry.                                Medical Decision Making Amount and/or Complexity of Data Reviewed Independent Historian: parent External Data Reviewed: labs, radiology and notes. Labs: ordered. Decision-making details documented in ED Course.  Risk OTC  drugs. Prescription drug management. Parenteral controlled substances. Decision regarding hospitalization. Diagnosis or treatment significantly limited by social determinants of health.           Final Clinical Impression(s) / ED Diagnoses Final diagnoses:  Suicidal ideation  Deliberate self-cutting    Rx / DC  Orders ED Discharge Orders     None         Lunabelle Oatley A, PA-C 02/25/24 1829    Kommor, Alyse July, MD 02/26/24 1213

## 2024-02-26 ENCOUNTER — Other Ambulatory Visit: Payer: Self-pay

## 2024-02-26 DIAGNOSIS — S51811A Laceration without foreign body of right forearm, initial encounter: Secondary | ICD-10-CM | POA: Diagnosis not present

## 2024-02-26 NOTE — Progress Notes (Signed)
 Pt has been accepted to [facility] on 02/26/2024 Bed assignment: [facility] [address] UNIT   Pt meets inpatient criteria per: Roise Cleaver NP  Attending Physician will be: Dr. Johney Nageotte   Report can be called to: [phone]  Pt can arrive ASAP  Care Team Notified: Heyward Loud NP, [place] Nickelson RN   [place] Kita Neace LCSW-A   02/26/2024 1:31 PM

## 2024-02-26 NOTE — ED Provider Notes (Signed)
 Emergency Medicine Observation Re-evaluation Note  Rebekah Davis is a 38 y.o. female, seen on rounds today.  Pt initially presented to the ED for complaints of Psychiatric Evaluation Currently, the patient is resting.  Physical Exam  BP 116/60 (BP Location: Left Arm)   Pulse 69   Temp 98.1 F (36.7 C) (Oral)   Resp 18   SpO2 100%  Physical Exam General: NAD  ED Course / MDM  EKG:   I have reviewed the labs performed to date as well as medications administered while in observation.  Recent changes in the last 24 hours include No acute events reported.  Plan  Current plan is for placement.    Burnette Carte, MD 02/26/24 [phone]

## 2024-02-26 NOTE — Progress Notes (Signed)
 LCSW Progress Note  [id number]   [name]  02/26/2024  1:04 PM  Description:   Inpatient Psychiatric Referral  Patient was recommended inpatient per Roise Cleaver NP). There are no available beds at [facility], per [facility] Bevin Bucks RN. Patient was referred to the following out of network facilities:    Destination  Service Provider Address Phone Fax  [facility] [address], [place] [address] [phone]  [facility] [address] [place] [address] [phone]  [facility] [address], [place] [address] [phone]  [facility] [address] [place] [address] [phone]  [facility] [place] [phone]  [facility] [address], [place] [address] [phone]  [facility] [address], [place] [address] [phone]  [facility] [address] [place] [address] [phone]  [facility] [address] Sharren Decree [place] [phone]  [facility] [address], [place] [address] [phone]  [facility] [address] [place] [address] [phone]  [facility] [address] [place] [address] [phone]  [facility] [address], [place] [address] [phone]  [facility] [address] [place], [place] [address] [phone]  [facility] [address], [place] [address] [phone]  [facility] [address], [place]  [place] [address] [phone]  [facility] Patient Placement [facility] [place], [place] [phone]  [facility] [address] [place] [address] [phone]  [facility] [address] [place], [place] [address] [phone]  [facility] [address] [place], [place] [address] [phone]  [facility] [address] Johnella Naas [place] [address] [phone]      Situation ongoing, CSW to continue following and update chart as more information becomes available.      [place] Tysha Grismore, MSW, LCSW  02/26/2024 1:04 PM

## 2024-02-26 NOTE — ED Notes (Signed)
 Pt requesting to leave hospital, states she would like to go see her normal psychiatrist. Informed patient that she is currently voluntary and so I cannot stop her from leaving. Pt then requested to see the doctor and asked about IVC status. Message sent to psych NP regarding same.

## 2024-02-26 NOTE — ED Notes (Signed)
 Pt c/o dizziness, eye pain. Pt states, "this happens to me. I don't know why." Unable to tell this RN when dizziness or eye pain began. Provided with warm wash cloth to wash face with. Pt also asking when doctor will be around, stating, "I think my lymph nodes are swollen again. I can feel it." Reminded that the providers will be making their rounds today. No S/S of distress noted. Sitter remains at bedside.

## 2024-02-26 NOTE — ED Notes (Signed)
 Rebekah Davis called to confirm patient's transfer.  The call was transferred to the nurse on duty.

## 2024-02-26 NOTE — ED Notes (Signed)
Safe transport called for patient transport to [facility].

## 2024-02-26 NOTE — ED Notes (Signed)
 Preparing to transfer to [facility]. Confirmed with patient that all belongings were taken home with emergency contacts listed.

## 2024-02-28 ENCOUNTER — Other Ambulatory Visit: Payer: Self-pay

## 2024-03-01 ENCOUNTER — Encounter (HOSPITAL_COMMUNITY): Payer: Self-pay | Admitting: Emergency Medicine

## 2024-03-01 ENCOUNTER — Emergency Department (HOSPITAL_COMMUNITY)
Admission: EM | Admit: 2024-03-01 | Discharge: 2024-03-02 | Disposition: A | Discharge status: Home / Self Care | Attending: Emergency Medicine | Admitting: Emergency Medicine

## 2024-03-01 DIAGNOSIS — R1031 Right lower quadrant pain: Secondary | ICD-10-CM | POA: Diagnosis present

## 2024-03-01 DIAGNOSIS — W01198A Fall on same level from slipping, tripping and stumbling with subsequent striking against other object, initial encounter: Secondary | ICD-10-CM | POA: Diagnosis not present

## 2024-03-01 DIAGNOSIS — R519 Headache, unspecified: Secondary | ICD-10-CM | POA: Insufficient documentation

## 2024-03-01 DIAGNOSIS — M62838 Other muscle spasm: Secondary | ICD-10-CM

## 2024-03-01 DIAGNOSIS — Y9301 Activity, walking, marching and hiking: Secondary | ICD-10-CM | POA: Diagnosis not present

## 2024-03-01 NOTE — ED Triage Notes (Addendum)
 PT was at [facility] on Thurs and slipped on wet floor. Hit head and was sent to get checked out at [facility]. She states she still has headaches and R groin pain. She checked herself out on Saturday- states has appt to go back to [facility] tomorrow but needs observation tonight before she can go back. Pt is alert and oriented and denying SI. Ambulates with baseline gait.

## 2024-03-01 NOTE — ED Provider Notes (Signed)
 [place] EMERGENCY DE[facility]MENT AT [facility] Provider Note   CSN: [id number] Arrival date & time: 03/01/24  2250     History  Chief Complaint  Patient presents with   Groin Injury    Rebekah Davis is a 38 y.o. female who presents with concern for HA and right groin pain persisting two days after mechanical fall while at [facility] for psychiatric hold.  She was walking and slipped in some water and fell to the ground hitting her head on the wall and splitting her legs out.  Was seen after that at an [facility] with negative head CT.  Presents today having been discharged from a [place] but with persistent pain.  She has been taking Robaxin  at home.  Accompanied by "my second mom" at the bedside.  Patient with history of PTSD and borderline personality disorder, undergoing continuous treatment with old [place] outpatient at this time.  She is not anticoagulated.  HPI     Home Medications Prior to Admission medications   Medication Sig Start Date End Date Taking? Authorizing Provider  Acetaminophen  (TYLENOL  PO) Take 1,000 mg by mouth in the morning and at bedtime.    [provider]  albuterol  (VENTOLIN  HFA) 108 (90 Base) MCG/ACT inhaler Inhale 1 puff into the lungs every 4 (four) hours as needed. Patient not taking: Reported on 02/26/2024 02/14/24     ARIPiprazole  (ABILIFY ) 5 MG tablet Take 1 tablet (5 mg total) by mouth at bedtime for mood. Patient not taking: Reported on 02/26/2024 12/28/23     cyclobenzaprine  (FLEXERIL ) 5 MG tablet Take 1 tablet (5 mg total) by mouth at bedtime as needed. Patient not taking: Reported on 02/26/2024 02/17/24     ergocalciferol  (VITAMIN D2) 1.25 MG (50000 UT) capsule Take 1 capsule (50,000 Units total) by mouth once a week. Patient not taking: Reported on 02/26/2024 10/23/23     escitalopram  (LEXAPRO ) 10 MG tablet Take 1 tablet (10 mg total) by mouth daily. Patient not taking: Reported on 02/26/2024 09/23/23     escitalopram   (LEXAPRO ) 5 MG tablet Take 1 tablet (5 mg total) by mouth daily. Patient not taking: Reported on 02/26/2024 09/23/23     hydrOXYzine  (ATARAX ) 10 MG tablet Take 1-2 tablets (10-20 mg total) by mouth daily as needed for anxiety. Patient not taking: Reported on 02/26/2024 02/18/24     hydrOXYzine  (ATARAX ) 25 MG tablet Take 1 tablet (25 mg total) by mouth 3 (three) times daily as needed for anxiety. 11/21/22   Tor Freed, FNP  IBUPROFEN  PO Take 600 mg by mouth 3 (three) times daily as needed (abnoral lymph node and pain).    [provider]  lamoTRIgine  (LAMICTAL ) 100 MG tablet Take 1 tablet (100 mg total) by mouth 2 (two) times daily. Patient not taking: Reported on 02/26/2024 11/26/23   Phebe Brasil, MD  LORazepam  (ATIVAN ) 0.5 MG tablet Take 1-2 tablets (0.5-1 mg total) by mouth every 4-6 hours as needed for severe panic attacks. 02/18/24     ondansetron  (ZOFRAN ) 8 MG tablet Take 1 tablet (8 mg total) by mouth every 8 (eight) hours as needed for nausea for 10 days. 07/18/23     phenazopyridine  (PYRIDIUM ) 100 MG tablet Take 1 tablet (100 mg total) by mouth 2 (two) times daily for Dysuria. Patient not taking: Reported on 02/26/2024 12/28/23     tamsulosin  (FLOMAX ) 0.4 MG CAPS capsule Take 1 capsule (0.4 mg total) by mouth daily. Patient not taking: Reported on 02/26/2024 01/24/24  Allergies    Adhesive [tape], Cherry, Sulfamethoxazole-trimethoprim, Levofloxacin, Morphine, Sulfa antibiotics, Tizanidine, Gabapentin, Metformin, and Sumatriptan    Review of Systems   Review of Systems  Eyes: Negative.   Musculoskeletal:        Myalgias  Neurological:  Positive for headaches. Negative for dizziness, seizures, facial asymmetry, light-headedness and numbness.    Physical Exam Updated Vital Signs BP (!) 149/116 (BP Location: Left Arm)   Pulse 89   Temp 98.9 F (37.2 C) (Oral)   Resp 18   SpO2 100%  Physical Exam Vitals and nursing note reviewed.  Constitutional:      Appearance: She is  obese. She is not ill-appearing or toxic-appearing.  HENT:     Head: Normocephalic and atraumatic.     Mouth/Throat:     Mouth: Mucous membranes are moist.     Pharynx: No oropharyngeal exudate or posterior oropharyngeal erythema.  Eyes:     General: Lids are normal. Vision grossly intact.        Right eye: No discharge.        Left eye: No discharge.     Extraocular Movements: Extraocular movements intact.     Conjunctiva/sclera: Conjunctivae normal.     Pupils: Pupils are equal, round, and reactive to light.  Cardiovascular:     Rate and Rhythm: Normal rate and regular rhythm.     Pulses: Normal pulses.     Heart sounds: Normal heart sounds. No murmur heard. Pulmonary:     Effort: Pulmonary effort is normal. No respiratory distress.     Breath sounds: Normal breath sounds. No wheezing or rales.  Abdominal:     General: Bowel sounds are normal. There is no distension.     Palpations: Abdomen is soft.     Tenderness: There is no abdominal tenderness. There is no right CVA tenderness, guarding or rebound.  Musculoskeletal:        General: No deformity.     Cervical back: Neck supple.     Right lower leg: No edema.     Left lower leg: No edema.       Legs:     Comments: Exam chaperoned by ED tech Micah  Skin:    General: Skin is warm and dry.     Capillary Refill: Capillary refill takes less than 2 seconds.  Neurological:     General: No focal deficit present.     Mental Status: She is alert and oriented to person, place, and time. Mental status is at baseline.     GCS: GCS eye subscore is 4. GCS verbal subscore is 5. GCS motor subscore is 6.     Cranial Nerves: Cranial nerves 2-12 are intact.     Sensory: Sensation is intact.     Motor: Motor function is intact.     Coordination: Coordination is intact.     Gait: Gait is intact.  Psychiatric:        Mood and Affect: Mood normal.     ED Results / Procedures / Treatments   Labs (all labs ordered are listed, but only  abnormal results are displayed) Labs Reviewed - No data to display  EKG None  Radiology No results found.  Procedures Procedures    Medications Ordered in ED Medications - No data to display  ED Course/ Medical Decision Making/ A&P  Medical Decision Making 37 year old female who presents with headache and right leg pain after mechanical fall 2 days ago.  Hypertensive on intake, vital signs otherwise normal neurologic exam is nonfocal cardiopulmonary abdominal exams are benign.  Musculoskeletal exam without evidence of skin changes to suggest herpes or zoster, consistent with muscle strain the patient has full active range of motion of the hips and knees.  Risk OTC drugs.   Clinical patient was consistent with mild concussion, directed patient for brain rest and treatment with Tylenol  for headache.  Regarding patient's groin/leg pain, physical exam and clinical picture are most consistent with muscular strain from her mechanical fall.  She is already on Robaxin , offered Lidoderm  patch but declined, may continue to take OTC analgesia as needed.  Clinical concern for emergent underlying injury that would warrant further ED workup and patient management exceedingly low.  Dulcinea  voiced understanding of her medical evaluation and treatment plan. Each of their questions answered to their expressed satisfaction.  Return precautions were given.  Patient is well-appearing, stable, and was discharged in good condition. This chart was dictated using voice recognition software, Dragon. Despite the best efforts of this provider to proofread and correct errors, errors may still occur which can change documentation meaning.         Final Clinical Impression(s) / ED Diagnoses Final diagnoses:  None    Rx / DC Orders ED Discharge Orders     None         Kae Oram, PA-C 03/03/24 0604    Ballard Bongo, MD 03/04/24  [phone]

## 2024-03-01 NOTE — ED Notes (Signed)
 Took another one on her left wrist it says 181/98(119)

## 2024-03-02 ENCOUNTER — Other Ambulatory Visit: Payer: Self-pay | Admitting: Family Medicine

## 2024-03-02 DIAGNOSIS — R1031 Right lower quadrant pain: Secondary | ICD-10-CM | POA: Diagnosis not present

## 2024-03-02 DIAGNOSIS — R591 Generalized enlarged lymph nodes: Secondary | ICD-10-CM

## 2024-03-02 MED ORDER — IBUPROFEN 200 MG PO TABS
600.0000 mg | ORAL_TABLET | Freq: Once | ORAL | Status: AC
  Administered 2024-03-02: 600 mg via ORAL
  Filled 2024-03-02: qty 3

## 2024-03-02 NOTE — Discharge Instructions (Addendum)
 You were seen in the emergency department today for your leg pain and headaces.  Your physical exam and vital signs are very reassuring.  The muscles in your leg were strained  This can be quite painful.  To help with your pain you may take Tylenol  and / or NSAID medication (such as ibuprofen  or naproxen) to help with your pain.   You may also utilize topical pain relief such as Biofreeze, IcyHot, or topical lidocaine  patches.  I also recommend that you apply heat to the area, such as a hot shower or heating pad, and follow heat application with massage of the muscles that are most tight.  Please return to the emergency department if you develop any numbness/tingling/weakness in your arms or legs, any difficulty urinating, or urinary incontinence chest pain, shortness of breath, abdominal pain, nausea or vomiting that does not stop, or any other new severe symptoms.  Regarding your concussion, It is very important that you rest over the next 48 hours. You should also not participate in any exercise or sports for at least 7 days and until you have no symptoms including headache, dizziness, nausea, or vomiting. Once your symptoms have resolved and you have gone at least 7 days following your injury, you may gradually return to light exercise as directed by your regular primary care provider. Return to the emergency department for repeat evaluation if you develop more than 2 episodes of vomiting, worsening headache despite use of Tylenol , difficulties with speech or balance, or other new concerns.

## 2024-03-04 ENCOUNTER — Other Ambulatory Visit (HOSPITAL_COMMUNITY): Payer: Self-pay

## 2024-03-05 ENCOUNTER — Other Ambulatory Visit: Payer: Self-pay

## 2024-03-05 ENCOUNTER — Encounter: Payer: Self-pay | Admitting: Cardiology

## 2024-03-05 ENCOUNTER — Ambulatory Visit: Attending: Cardiology | Admitting: Cardiology

## 2024-03-05 VITALS — BP 132/88 | HR 122 | Ht 65.5 in | Wt 320.0 lb

## 2024-03-05 DIAGNOSIS — G4730 Sleep apnea, unspecified: Secondary | ICD-10-CM | POA: Diagnosis present

## 2024-03-05 DIAGNOSIS — R002 Palpitations: Secondary | ICD-10-CM | POA: Diagnosis present

## 2024-03-05 NOTE — Patient Instructions (Signed)

## 2024-03-05 NOTE — Progress Notes (Signed)
  Electrophysiology Office Note:    Date:  03/05/2024   ID:  Tyrica Afzal, DOB 1985/12/05, MRN [id number]  [facility] Cardiologist:  None  [facility] Electrophysiologist:  Boyce Byes, MD   Referring MD: Olin Bertin, MD   Chief Complaint: SVT  History of Present Illness:    Ms. Rebekah Davis is a 38 year old woman who I am seeing today for an evaluation of SVT at the request of Dr. Kimble Pennant.  The patient was previously seen for the same by [facility] on January 27, 2024.  Her initial contact with [facility] was in the setting of a sinus pause while under general anesthesia.  This was thought to be secondary to sleep apnea.  The patient had been worked up with a Zio patch as well which showed a 16 beat run of SVT with a ventricular rate greater than 200 bpm.  Her past medical history includes PCOS, anxiety, depression, PTSD, obesity and palpitations.  She tells me that her episodes occur when she is feeling anxious.  She will typically have symptoms of severe anxiety while she feels her heart flutter.  She actually had her worst episode while wearing the ZIO monitor which corresponded to the 16 beat run of atrial tachycardia.    Their past medical, social and family history was reviewed.   ROS:   Please see the history of present illness.    All other systems reviewed and are negative.  EKGs/Labs/Other Studies Reviewed:    The following studies were reviewed today:  September 28, 2023 Holter monitor ([facility] system, report only) Heart rate 57-2 14, average 93 1 NSVT lasting 6 beats Rare supraventricular and ventricular ectopy  EKG Interpretation Date/Time:  Thursday Mar 05 2024 09:07:26 EDT Ventricular Rate:  122 PR Interval:  130 QRS Duration:  78 QT Interval:  318 QTC Calculation: 453 R Axis:   61  Text Interpretation: Sinus tachycardia Confirmed by Harvie Liner [phone]) on 03/05/2024 9:09:19 AM    Physical Exam:    VS:  BP 132/88   Pulse (!)  122   Ht 5' 5.5" (1.664 m)   Wt (!) 320 lb (145.2 kg)   SpO2 96%   BMI 52.44 kg/m     Wt Readings from Last 3 Encounters:  03/05/24 (!) 320 lb (145.2 kg)  02/21/24 (!) 323 lb (146.5 kg)  11/26/23 (!) 338 lb 9.6 oz (153.6 kg)     GEN: no distress.  Morbidly obese CARD: RRR, No MRG RESP: No IWOB. CTAB.        ASSESSMENT AND PLAN:    1. Palpitations   2. Sleep apnea, unspecified type     #Palpitations The patient is worn a monitor in the past that showed 16 beats of NSVT.  I suspect she is having salvos of atrial tachycardia although I cannot completely exclude other mechanisms of arrhythmia including AVNRT or AVRT.  The monitor did not show evidence of atrial fibrillation.  Some of this may be related to her sleep apnea.  #Sleep apnea CPAP use encouraged.  .  Follow-up as needed.     Signed, Leanora Prophet. Marven Slimmer, MD, [facility], [facility] 03/05/2024 9:17 AM    Electrophysiology [facility]

## 2024-03-06 ENCOUNTER — Other Ambulatory Visit: Payer: Self-pay

## 2024-03-10 ENCOUNTER — Telehealth: Payer: Self-pay

## 2024-03-10 DIAGNOSIS — R569 Unspecified convulsions: Secondary | ICD-10-CM

## 2024-03-10 DIAGNOSIS — F39 Unspecified mood [affective] disorder: Secondary | ICD-10-CM

## 2024-03-10 NOTE — Telephone Encounter (Signed)
 OK to switch to Dr. Albertina Hugger if she agrees, otherwise, let her contact her PCP to be referred to different office.

## 2024-03-10 NOTE — Telephone Encounter (Signed)
 This patient called in today to schedule an appointment due to a concussion. Per the patient, her eye doctor and primary care saw her and advised that she needs to see neurology. I advised that we would need a new referral in order to see her for this. She advised at that point that she would just rather see a completely new office for this if she have to get a referral. I let her know that if she did wish to see another clinic, PCP or her eye doctor could refer her to another office. I asked if she would like to keep her appt with Camilo Cella scheduled for August. She advised that she does not wish to see Dr Gracie Lav any longer, and does not want to see any NP that is under her. She is asking for a provider switch to Dr Albertina Hugger for her six month follow up.  She is also asking if she needs this follow up in order for her to be able to drive again.  Please advise

## 2024-03-11 ENCOUNTER — Telehealth: Payer: Self-pay

## 2024-03-11 NOTE — Telephone Encounter (Signed)
 Patient returned my call after mychart message sent. Patient upset that new referral was needed to evaluate for concussion and stated she would go to another practice. Patient was not happy and questioning why her appointment in August was cancelled. I reviewed notes and calls with and from patient stating she did not want to see Dr. Gracie Lav nor an NP. Dr. Gracie Lav was in agreement to transfer to Dr. Albertina Hugger but after review, Dr. Albertina Hugger declines transfer care because has nothing to add. Dr. Gracie Lav advised if Dr. Albertina Hugger not in agreement, patient could get referral from PCP to another Neurology office. Mychart message sent explaining to patient. She message back multiple times and then called the office. She says she needs to get her license back and be able to drive. Reviewed [place] driving laws with patient. Patient began to yell at me and I advised we can end this call and she can call back when she able to communicate in an appropriate manner. Patient calmed down. She asked about asking every provider if the would accept her and I advised Dr. Gracie Lav is not in agreement with that. She said she has test to be done and no one has reached out. I advised that I could follow up on that but she will need to contact her PCP to manage medications and referral to new neurologist and get orders for testing if they wanted those completed. Explained because she has already made reference to not wanting to see Dr. Gracie Lav or anyone under her, she would need to contact PCP to reestablish with different neurology office  Verbal conversation with Dr. Gracie Lav and she is in agreement to discharge patient from practice due to patient not wishing to see her or anyone under her.   Spoke to [facility], they have reached out multiple occasions and unable to schedule patient for various reasons on patient behalf (2/26, 2/27, 3/20,3/31,4/9, 5/16, and 5/17)  Spoke with sharon at [facility] eye care, referral not received but they did get new fax machine so  could have gotten lost.   [facility]. Referral and auth documentation but no documentation that patient was attempted to call.     O[place]an [place] V to agreed call PCP and advised of discharging and need for managing medications  until reestablished with new neurology office.

## 2024-03-11 NOTE — Telephone Encounter (Signed)
 Noted

## 2024-03-11 NOTE — Telephone Encounter (Signed)
 Spoke with Candice at Dr Alexia Angelucci office. Advised her that the patient is being dismissed from our office due to breakdown of patient provider relationship - went over conversations. I let her know we would manage medications and be a resource for the patient for 30 days after the dismissal, but after that time frame, medication would need to be managed by PCP. Advised they would also have to facilitate a referral to a new neurologist for Ms Paradise.  She expressed understanding and appreciation for the call and was going to send a message back to Dr Alexia Angelucci to make her aware.

## 2024-03-11 NOTE — Telephone Encounter (Signed)
 I have spoke with Dr Albertina Hugger about this patient and she doesn't have anything additionally she would offer for the pt and doesn't wish to accept as transfer of care, She recommends that if she would like to see another provider she would recommend referral to tertiary care center or alternative neurology office they would be willing to accept transfer of care.

## 2024-03-12 ENCOUNTER — Telehealth: Payer: Self-pay

## 2024-03-12 ENCOUNTER — Encounter: Payer: Self-pay | Admitting: Neurology

## 2024-03-12 ENCOUNTER — Other Ambulatory Visit: Payer: Self-pay | Admitting: Orthopedic Surgery

## 2024-03-12 ENCOUNTER — Telehealth: Payer: Self-pay | Admitting: Neurology

## 2024-03-12 DIAGNOSIS — M545 Low back pain, unspecified: Secondary | ICD-10-CM

## 2024-03-12 NOTE — Telephone Encounter (Signed)
 Orders Placed This Encounter  Procedures   MR BRAIN W WO CONTRAST   Ambulatory referral to Ophthalmology   Please make sure she has ophthalmology appointment documented in our chart, and is on schedule for MRI  Will inform patient about workup result,

## 2024-03-12 NOTE — Telephone Encounter (Signed)
 Attempted to call patient 3 times, message states " call can not be completed at this time". My chart message sent to patient.

## 2024-03-12 NOTE — Telephone Encounter (Signed)
 Attempted to call 3 times and mychart message sent.  Call to below to ensure referrals received and they will reach out to patient     [facility] for the 72 hour ambulatory EEG, spoke with [place]. Their number is [phone], option 1.   [facility] [phone], opt 1 then opt 3( spoke with Jenifer Miu)   [facility] [phone], spoke with Genevia Kern. She will let us  know when

## 2024-03-12 NOTE — Telephone Encounter (Signed)
 Pt called requesting to speak to nurse . PT   want to know  going forward when will she be reached out to about another  provider in  our office and weather or not  the test that Dr . Gracie Lav ordered in February be done with a new Provider .

## 2024-03-12 NOTE — Telephone Encounter (Signed)
 Refax referral for ophthalmology to [facility]. Phone: [phone], Fax: [phone]  Contacted [facility] spoke with Landa Pine to make aware refaxing referral for ophthalmology that was originally sent on  11/27/23

## 2024-03-12 NOTE — Telephone Encounter (Signed)
 Referral for ophthalmology fax to [facility]. Phone: [phone], Fax: [phone]. Contacted [employer] spoke with Genevia Kern; confirmed received referral for ophthalmology.

## 2024-03-12 NOTE — Telephone Encounter (Signed)
 noted

## 2024-03-12 NOTE — Addendum Note (Signed)
 Addended by: Susann Lawhorne on: 03/12/2024 10:06 AM   Modules accepted: Orders

## 2024-03-12 NOTE — Telephone Encounter (Signed)
 error

## 2024-03-12 NOTE — Telephone Encounter (Signed)
 Call to [facility] and [facility] to provided updated patient number [phone]

## 2024-03-12 NOTE — Telephone Encounter (Signed)
 Call to  GI/MRI, spoke with [place], they were providde updated number and unable to reach patient.

## 2024-03-13 ENCOUNTER — Telehealth: Payer: Self-pay | Admitting: Neurology

## 2024-03-13 NOTE — Telephone Encounter (Signed)
 Rebekah Davis from [facility] called today stating that the pt's referral was received and pt was called to be offered an appt in June. Pt stated that she did not know why she was given this referral and that she would call her Neurologist to find out why this referral was sent,

## 2024-03-16 ENCOUNTER — Telehealth: Payer: Self-pay

## 2024-03-16 ENCOUNTER — Ambulatory Visit (HOSPITAL_COMMUNITY)
Admission: EM | Admit: 2024-03-16 | Discharge: 2024-03-17 | Disposition: A | Discharge status: Home / Self Care | Attending: Nurse Practitioner | Admitting: Nurse Practitioner

## 2024-03-16 DIAGNOSIS — R45851 Suicidal ideations: Secondary | ICD-10-CM | POA: Diagnosis not present

## 2024-03-16 DIAGNOSIS — F332 Major depressive disorder, recurrent severe without psychotic features: Secondary | ICD-10-CM | POA: Diagnosis present

## 2024-03-16 DIAGNOSIS — F4323 Adjustment disorder with mixed anxiety and depressed mood: Secondary | ICD-10-CM | POA: Diagnosis not present

## 2024-03-16 MED ORDER — HALOPERIDOL 5 MG PO TABS: 5.0000 mg | ORAL_TABLET | Freq: Three times a day (TID) | ORAL | Status: DC | PRN

## 2024-03-16 MED ORDER — LORAZEPAM 2 MG/ML IJ SOLN: 2.0000 mg | Freq: Three times a day (TID) | INTRAMUSCULAR | Status: DC | PRN

## 2024-03-16 MED ORDER — DIPHENHYDRAMINE HCL 50 MG/ML IJ SOLN: 50.0000 mg | Freq: Three times a day (TID) | INTRAMUSCULAR | Status: DC | PRN

## 2024-03-16 MED ORDER — METHOCARBAMOL 500 MG PO TABS: 500.0000 mg | ORAL_TABLET | Freq: Three times a day (TID) | ORAL | Status: DC | PRN

## 2024-03-16 MED ORDER — DIPHENHYDRAMINE HCL 50 MG PO CAPS: 50.0000 mg | ORAL_CAPSULE | Freq: Three times a day (TID) | ORAL | Status: DC | PRN

## 2024-03-16 MED ORDER — HALOPERIDOL LACTATE 5 MG/ML IJ SOLN: 10.0000 mg | Freq: Three times a day (TID) | INTRAMUSCULAR | Status: DC | PRN

## 2024-03-16 MED ORDER — POLYETHYLENE GLYCOL 3350 17 G PO PACK
17.0000 g | PACK | Freq: Every day | ORAL | Status: DC
  Filled 2024-03-16: qty 1

## 2024-03-16 MED ORDER — MAGNESIUM HYDROXIDE 400 MG/5ML PO SUSP: 30.0000 mL | Freq: Every day | ORAL | Status: DC | PRN

## 2024-03-16 MED ORDER — HALOPERIDOL LACTATE 5 MG/ML IJ SOLN: 5.0000 mg | Freq: Three times a day (TID) | INTRAMUSCULAR | Status: DC | PRN

## 2024-03-16 MED ORDER — ALUM & MAG HYDROXIDE-SIMETH 200-200-20 MG/5ML PO SUSP: 30.0000 mL | ORAL | Status: DC | PRN

## 2024-03-16 MED ORDER — HYDROXYZINE HCL 25 MG PO TABS
25.0000 mg | ORAL_TABLET | Freq: Three times a day (TID) | ORAL | Status: DC | PRN
  Administered 2024-03-17: 25 mg via ORAL
  Filled 2024-03-16: qty 1

## 2024-03-16 MED ORDER — ACETAMINOPHEN 325 MG PO TABS
650.0000 mg | ORAL_TABLET | Freq: Four times a day (QID) | ORAL | Status: DC | PRN
  Administered 2024-03-17: 650 mg via ORAL
  Filled 2024-03-16: qty 2

## 2024-03-16 MED ORDER — TRAZODONE HCL 50 MG PO TABS
50.0000 mg | ORAL_TABLET | Freq: Every evening | ORAL | Status: DC | PRN
  Filled 2024-03-16: qty 1

## 2024-03-16 NOTE — Telephone Encounter (Signed)
 Notified by Mahasen at [facility] that on 03/12/24, she reached out to patient. No answer and left a voicemail to return call
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
## 2024-03-16 NOTE — Telephone Encounter (Signed)
Noted.  Will update patient.

## 2024-03-16 NOTE — Telephone Encounter (Signed)
 Pt called and is needing a call back because she states that she is not able to respond to this message. She stated that the information was wrong on the message. She is also needing a letter releasing her from this office's care and her records. Please advise .

## 2024-03-16 NOTE — Telephone Encounter (Signed)
 Call to [facility],spoke to [place]. they called patient Friday 5/30 to schedule MRI brain and she told told staff " she didn't need this done and didn't know why it was ordered". Advised I would notate patient chart.
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
## 2024-03-17 ENCOUNTER — Telehealth: Payer: Self-pay

## 2024-03-17 ENCOUNTER — Other Ambulatory Visit: Payer: Self-pay

## 2024-03-17 ENCOUNTER — Telehealth: Payer: Self-pay | Admitting: Neurology

## 2024-03-17 DIAGNOSIS — F332 Major depressive disorder, recurrent severe without psychotic features: Secondary | ICD-10-CM | POA: Diagnosis not present

## 2024-03-17 LAB — COMPREHENSIVE METABOLIC PANEL WITH GFR
ALT: 39 U/L (ref 0–44)
AST: 23 U/L (ref 15–41)
Albumin: 3.5 g/dL (ref 3.5–5.0)
Alkaline Phosphatase: 76 U/L (ref 38–126)
Anion gap: 9 (ref 5–15)
BUN: 19 mg/dL (ref 6–20)
CO2: 25 mmol/L (ref 22–32)
Calcium: 9.2 mg/dL (ref 8.9–10.3)
Chloride: 107 mmol/L (ref 98–111)
Creatinine, Ser: 1.24 mg/dL — ABNORMAL HIGH (ref 0.44–1.00)
GFR, Estimated: 57 mL/min — ABNORMAL LOW (ref >60)
Glucose, Bld: 113 mg/dL — ABNORMAL HIGH (ref 70–99)
Potassium: 3.9 mmol/L (ref 3.5–5.1)
Sodium: 141 mmol/L (ref 135–145)
Total Bilirubin: 0.5 mg/dL (ref 0.0–1.2)
Total Protein: 5.8 g/dL — ABNORMAL LOW (ref 6.5–8.1)

## 2024-03-17 LAB — CBC WITH DIFFERENTIAL/PLATELET
Abs Immature Granulocytes: 0.03 10*3/uL (ref 0.00–0.07)
Basophils Absolute: 0 10*3/uL (ref 0.0–0.1)
Basophils Relative: 1 %
Eosinophils Absolute: 0.2 10*3/uL (ref 0.0–0.5)
Eosinophils Relative: 2 %
HCT: 41.2 % (ref 36.0–46.0)
Hemoglobin: 13.8 g/dL (ref 12.0–15.0)
Immature Granulocytes: 0 %
Lymphocytes Relative: 34 %
Lymphs Abs: 2.8 10*3/uL (ref 0.7–4.0)
MCH: 31.1 pg (ref 26.0–34.0)
MCHC: 33.5 g/dL (ref 30.0–36.0)
MCV: 92.8 fL (ref 80.0–100.0)
Monocytes Absolute: 0.6 10*3/uL (ref 0.1–1.0)
Monocytes Relative: 7 %
Neutro Abs: 4.8 10*3/uL (ref 1.7–7.7)
Neutrophils Relative %: 56 %
Platelets: 266 10*3/uL (ref 150–400)
RBC: 4.44 MIL/uL (ref 3.87–5.11)
RDW: 12.6 % (ref 11.5–15.5)
WBC: 8.5 10*3/uL (ref 4.0–10.5)
nRBC: 0 % (ref 0.0–0.2)

## 2024-03-17 LAB — TSH: TSH: 2.009 u[IU]/mL (ref 0.350–4.500)

## 2024-03-17 LAB — LIPID PANEL
Cholesterol: 169 mg/dL (ref 0–200)
HDL: 38 mg/dL — ABNORMAL LOW (ref >40)
LDL Cholesterol: 106 mg/dL — ABNORMAL HIGH (ref 0–99)
Total CHOL/HDL Ratio: 4.4 ratio
Triglycerides: 127 mg/dL (ref <150)
VLDL: 25 mg/dL (ref 0–40)

## 2024-03-17 LAB — HEMOGLOBIN A1C
Hgb A1c MFr Bld: 4.6 % — ABNORMAL LOW (ref 4.8–5.6)
Mean Plasma Glucose: 85.32 mg/dL

## 2024-03-17 MED ORDER — IBUPROFEN 400 MG PO TABS
400.0000 mg | ORAL_TABLET | Freq: Four times a day (QID) | ORAL | Status: DC | PRN
  Administered 2024-03-17: 400 mg via ORAL
  Filled 2024-03-17: qty 1

## 2024-03-17 NOTE — Telephone Encounter (Signed)
 Dr. Gracie Lav has attempted to call patient 2x with no answer and left voicemail. If patient returns call please send directly to Dr. YAn

## 2024-03-17 NOTE — ED Notes (Signed)
 Patient was handed a cup and made aware that we need to get a specimen. Patient said "I have peed 3 times, and they never told me"

## 2024-03-17 NOTE — ED Notes (Signed)
 Patient d/c in stable condition with all belongings. Patient denies SI, HI, AVH and does not appear to be responding to internal stimuli.

## 2024-03-17 NOTE — ED Notes (Signed)
 Pt in chair c/o headache. Meds administered for headache see MAR. Respirations equal and unlabored, skin warm and dry. Pt was offered support and encouragement. Pt receptive to treatment and safety maintained on unit. Routine safety checks maintained/ongoing. Aaron Aas

## 2024-03-17 NOTE — Telephone Encounter (Signed)
 I have called patient and left message on her cellphone x2 and home phone for her to call back to office for discussion

## 2024-03-17 NOTE — ED Provider Notes (Signed)
 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   Today, patient denies SI and states "I was just upset and has having a headache. I am looking for a therapist". She reports losing her therapist due to a change in insurance recently. She contracts for safety and denies previous suicide attempts in the past. She sees an NP for psychotropic medication management. She is interested in trauma focused therapy and states the reason is "I have DID and I don't want to traumatize a preschooler". She is agreeable to going to open access in the [facility] clinic upstairs for medication management and therapy resources. She denies HI and AVH. When asked what her motivating factors are, she states "I am seeking help because I want to get better". Patient has upcoming appointments with radiology, PT, dermatology, and neurology.  Stay Summary: The patient was evaluated each day by a clinical provider to ascertain response to treatment. Improvement was noted by the patient's report of decreasing symptoms, improved sleep and appetite, affect, medication tolerance, behavior, and participation in unit programming.  Patient was asked each day to complete a self inventory noting mood, mental status, pain, new symptoms, anxiety and concerns.  Patient responded well to medication and being in a therapeutic and supportive environment. Positive and appropriate behavior was noted and the patient was motivated for recovery. The patient  worked closely with the treatment team and case manager to develop a discharge plan with appropriate goals. Coping skills, problem solving as well as relaxation therapies were also part of the unit programming.  Total Time spent with patient: 30 minutes  Past Psychiatric History: MDD, currently on Abilify   Past Medical History: Lupus Social History: Lives in [place], currently unemployed receiving workers comp, church is her support  Current Medications:  Current Facility-Administered Medications  Medication Dose Route Frequency Provider Last Rate Last Admin   acetaminophen  (TYLENOL ) tablet 650 mg  650 mg Oral Q6H PRN Onuoha, Chinwendu V, NP   650 mg at 03/17/24 0309   alum & mag hydroxide-simeth (MAALOX/MYLANTA) 200-200-20 MG/5ML suspension 30 mL  30 mL Oral Q4H PRN Onuoha, Chinwendu V, NP       haloperidol (HALDOL) tablet 5 mg  5 mg Oral TID PRN Onuoha, Chinwendu V, NP       And   diphenhydrAMINE (BENADRYL) capsule 50 mg  50 mg Oral TID PRN Onuoha, Chinwendu V, NP       haloperidol lactate (HALDOL) injection 5 mg  5 mg Intramuscular TID PRN Onuoha, Chinwendu V, NP       And   diphenhydrAMINE (BENADRYL) injection 50 mg  50 mg Intramuscular TID PRN Onuoha, Chinwendu V, NP       And   LORazepam  (ATIVAN ) injection 2 mg  2 mg Intramuscular TID PRN Onuoha, Chinwendu V, NP       haloperidol lactate (HALDOL) injection 10 mg  10 mg Intramuscular TID PRN Onuoha, Chinwendu V, NP       And   diphenhydrAMINE (BENADRYL) injection 50 mg  50 mg  Intramuscular TID PRN Onuoha, Chinwendu V, NP       And   LORazepam  (ATIVAN ) injection 2 mg  2 mg Intramuscular TID PRN Onuoha, Chinwendu V, NP       hydrOXYzine  (ATARAX ) tablet 25 mg  25 mg Oral TID PRN Onuoha, Chinwendu V, NP   25 mg at 03/17/24 0003   ibuprofen  (ADVIL ) tablet 400 mg  400 mg Oral Q6H PRN Tayanna Talford B, MD   400 mg at 03/17/24 1610   magnesium  hydroxide (MILK OF MAGNESIA) suspension 30 mL  30 mL Oral Daily PRN Onuoha, Chinwendu V, NP        methocarbamol  (ROBAXIN ) tablet 500 mg  500 mg Oral Q8H PRN Onuoha, Chinwendu V, NP       polyethylene glycol (MIRALAX / GLYCOLAX) packet 17 g  17 g Oral Daily Onuoha, Chinwendu V, NP       traZODone (DESYREL) tablet 50 mg  50 mg Oral QHS PRN Onuoha, Chinwendu V, NP       Current Outpatient Medications  Medication Sig Dispense Refill   methocarbamol  (ROBAXIN ) 500 MG tablet Take 500 mg by mouth every 8 (eight) hours as needed.     ondansetron  (ZOFRAN -ODT) 4 MG disintegrating tablet Take 4 mg by mouth daily.     Acetaminophen  (TYLENOL  PO) Take 1,000 mg by mouth 2 (two) times daily as needed.     albuterol  (VENTOLIN  HFA) 108 (90 Base) MCG/ACT inhaler Inhale 1 puff into the lungs every 4 (four) hours as needed. 18 g 3   ARIPiprazole  (ABILIFY ) 5 MG tablet Take 1 tablet (5 mg total) by mouth at bedtime for mood. (Patient taking differently: Take 5 mg by mouth every morning.) 14 tablet 0   Cholecalciferol 50 MCG (2000 UT) CAPS Take 2,000 capsules by mouth daily.     cyclobenzaprine  (FLEXERIL ) 5 MG tablet Take 1 tablet (5 mg total) by mouth at bedtime as needed. 14 tablet 0   ergocalciferol  (VITAMIN D2) 1.25 MG (50000 UT) capsule Take 1 capsule (50,000 Units total) by mouth once a week. 4 capsule 2   hydrOXYzine  (ATARAX ) 25 MG tablet Take 1 tablet (25 mg total) by mouth 3 (three) times daily as needed for anxiety. (Patient taking differently: Take 25 mg by mouth 3 (three) times daily as needed for anxiety. And at bedtime as needed) 30 tablet 0   ibuprofen  (ADVIL ) 800 MG tablet Take 800 mg by mouth 3 (three) times daily.     lamoTRIgine  (LAMICTAL ) 100 MG tablet Take 1 tablet (100 mg total) by mouth 2 (two) times daily. 60 tablet 11   levonorgestrel (MIRENA, 52 MG,) 20 MCG/DAY IUD 1 each by Intrauterine route once.     LORazepam  (ATIVAN ) 0.5 MG tablet Take 1-2 tablets (0.5-1 mg total) by mouth every 4-6 hours as needed for severe panic attacks. (Patient taking differently: Take 0.25-0.5 mg by mouth every 4  (four) hours as needed for anxiety.) 10 tablet 2   phenazopyridine  (PYRIDIUM ) 100 MG tablet Take 1 tablet (100 mg total) by mouth 2 (two) times daily for Dysuria. 28 tablet 0   polyethylene glycol powder (GLYCOLAX/MIRALAX) 17 GM/SCOOP powder Take 17 g by mouth daily.     tamsulosin  (FLOMAX ) 0.4 MG CAPS capsule Take 1 capsule (0.4 mg total) by mouth daily. 30 capsule 0    PTA Medications:  Facility Ordered Medications  Medication   acetaminophen  (TYLENOL ) tablet 650 mg   alum & mag hydroxide-simeth (MAALOX/MYLANTA) 200-200-20 MG/5ML suspension 30 mL   magnesium  hydroxide (MILK  OF MAGNESIA) suspension 30 mL   haloperidol (HALDOL) tablet 5 mg   And   diphenhydrAMINE (BENADRYL) capsule 50 mg   haloperidol lactate (HALDOL) injection 5 mg   And   diphenhydrAMINE (BENADRYL) injection 50 mg   And   LORazepam  (ATIVAN ) injection 2 mg   haloperidol lactate (HALDOL) injection 10 mg   And   diphenhydrAMINE (BENADRYL) injection 50 mg   And   LORazepam  (ATIVAN ) injection 2 mg   hydrOXYzine  (ATARAX ) tablet 25 mg   traZODone (DESYREL) tablet 50 mg   methocarbamol  (ROBAXIN ) tablet 500 mg   polyethylene glycol (MIRALAX / GLYCOLAX) packet 17 g   ibuprofen  (ADVIL ) tablet 400 mg   PTA Medications  Medication Sig   ondansetron  (ZOFRAN -ODT) 4 MG disintegrating tablet Take 4 mg by mouth daily.   methocarbamol  (ROBAXIN ) 500 MG tablet Take 500 mg by mouth every 8 (eight) hours as needed.   hydrOXYzine  (ATARAX ) 25 MG tablet Take 1 tablet (25 mg total) by mouth 3 (three) times daily as needed for anxiety. (Patient taking differently: Take 25 mg by mouth 3 (three) times daily as needed for anxiety. And at bedtime as needed)   Acetaminophen  (TYLENOL  PO) Take 1,000 mg by mouth 2 (two) times daily as needed.   ARIPiprazole  (ABILIFY ) 5 MG tablet Take 1 tablet (5 mg total) by mouth at bedtime for mood. (Patient taking differently: Take 5 mg by mouth every morning.)   tamsulosin  (FLOMAX ) 0.4 MG CAPS capsule Take 1  capsule (0.4 mg total) by mouth daily.   lamoTRIgine  (LAMICTAL ) 100 MG tablet Take 1 tablet (100 mg total) by mouth 2 (two) times daily.   phenazopyridine  (PYRIDIUM ) 100 MG tablet Take 1 tablet (100 mg total) by mouth 2 (two) times daily for Dysuria.   ergocalciferol  (VITAMIN D2) 1.25 MG (50000 UT) capsule Take 1 capsule (50,000 Units total) by mouth once a week.   albuterol  (VENTOLIN  HFA) 108 (90 Base) MCG/ACT inhaler Inhale 1 puff into the lungs every 4 (four) hours as needed.   cyclobenzaprine  (FLEXERIL ) 5 MG tablet Take 1 tablet (5 mg total) by mouth at bedtime as needed.   LORazepam  (ATIVAN ) 0.5 MG tablet Take 1-2 tablets (0.5-1 mg total) by mouth every 4-6 hours as needed for severe panic attacks. (Patient taking differently: Take 0.25-0.5 mg by mouth every 4 (four) hours as needed for anxiety.)   ibuprofen  (ADVIL ) 800 MG tablet Take 800 mg by mouth 3 (three) times daily.   levonorgestrel (MIRENA, 52 MG,) 20 MCG/DAY IUD 1 each by Intrauterine route once.   polyethylene glycol powder (GLYCOLAX/MIRALAX) 17 GM/SCOOP powder Take 17 g by mouth daily.   Cholecalciferol 50 MCG (2000 UT) CAPS Take 2,000 capsules by mouth daily.       02/06/2023    3:34 PM 10/26/2022    3:18 PM 07/30/2022    4:46 PM  Depression screen PHQ 2/9  Decreased Interest 0 3 2  Down, Depressed, Hopeless 0 3 2  PHQ - 2 Score 0 6 4  Altered sleeping 0 3   Tired, decreased energy 0 3   Change in appetite 0 2   Feeling bad or failure about yourself  0 2   Trouble concentrating 0 3   Moving slowly or fidgety/restless 0 0   Suicidal thoughts 0 1   PHQ-9 Score 0 20   Difficult doing work/chores Not difficult at all Very difficult Very difficult    Flowsheet Row ED from 03/16/2024 in [facility] from 03/01/2024  in [facility] Department at [facility] ED from 02/25/2024 in [facility] Emergency Department at [facility]  C-SSRS RISK CATEGORY High Risk Error: Q3, 4,  or 5 should not be populated when Q2 is No Low Risk       Musculoskeletal  Strength & Muscle Tone: within normal limits Gait & Station: normal Patient leans: N/A  Psychiatric Specialty Exam  Presentation  General Appearance:  Appropriate for Environment; Casual  Eye Contact: Fair  Speech: Clear and Coherent; Normal Rate  Speech Volume: Normal  Handedness: Right   Mood and Affect  Mood: Depressed  Affect: Congruent   Thought Process  Thought Processes: Coherent; Linear; Goal Directed  Descriptions of Associations:Intact  Orientation:Full (Time, Place and Person)  Thought Content:Logical; WDL  Diagnosis of Schizophrenia or Schizoaffective disorder in past: No  Hallucinations:Hallucinations: None  Ideas of Reference:None  Suicidal Thoughts:Suicidal Thoughts: No   Homicidal Thoughts:Homicidal Thoughts: No   Sensorium  Memory: Remote Good  Judgment: Fair  Insight: Fair   Art therapist  Concentration: Good  Attention Span: Good  Recall: Good  Fund of Knowledge: Good  Language: Good   Psychomotor Activity  Psychomotor Activity: Psychomotor Activity: Normal   Assets  Assets: Communication Skills; Resilience   Sleep  Sleep: Sleep: Fair   Nutritional Assessment (For OBS and [facility]BC admissions only) Has the patient had a weight loss or gain of 10 pounds or more in the last 3 months?: No Has the patient had a decrease in food intake/or appetite?: No Does the patient have dental problems?: No Does the patient have eating habits or behaviors that may be indicators of an eating disorder including binging or inducing vomiting?: No Has the patient recently lost weight without trying?: 0 Has the patient been eating poorly because of a decreased appetite?: 0 Malnutrition Screening Tool Score: 0    Physical Exam  Physical Exam Vitals reviewed.  HENT:     Head: Normocephalic and atraumatic.  Cardiovascular:     Rate and  Rhythm: Normal rate.  Pulmonary:     Effort: Pulmonary effort is normal.  Skin:    Comments: Erythematous rash on face  Neurological:     General: No focal deficit present.     Mental Status: She is alert.    Review of Systems  Constitutional:  Negative for chills and fever.  Respiratory:  Negative for shortness of breath.   Cardiovascular:  Negative for chest pain and palpitations.  Gastrointestinal:  Negative for nausea and vomiting.  Neurological:  Positive for headaches.   Blood pressure 138/78, pulse 94, temperature 98.2 F (36.8 C), temperature source Oral, resp. rate 16, SpO2 100%. There is no height or weight on file to calculate BMI.  Demographic Factors:  Caucasian and Unemployed  Loss Factors: Decline in physical health  Historical Factors: Impulsivity  Risk Reduction Factors:   Religious beliefs about death, Positive social support, and Positive coping skills or problem solving skills  Continued Clinical Symptoms:  Depression:   Hopelessness  Cognitive Features That Contribute To Risk:  Closed-mindedness    Suicide Risk:  Mild:  Suicidal ideation of limited frequency, intensity, duration, and specificity.  There are no identifiable plans, no associated intent, mild dysphoria and related symptoms, good self-control (both objective and subjective assessment), few other risk factors, and identifiable protective factors, including available and accessible social support.  Plan Of Care/Follow-up recommendations:  Activity as tolerated Regular diet Continue prescription medications See PCP for medical conditions  Therapy and med management  resources in AVS  Disposition: Home  Joice Nares, MD 03/17/2024, 10:04 AM

## 2024-03-17 NOTE — ED Notes (Signed)
 Pt ambulated on to observation unit w/ sunglasses on d/t light sensitivity. Skin was assessed and found to have Bilateral self inflicted healed cuts; Tattoos: rt lateral shin, lt foot. PT searched and no contraband found, POC and unit policies explained and understanding verbalized. Food and fluids offered, and both accepted. Pt had no additional questions or concerns

## 2024-03-17 NOTE — Discharge Instructions (Addendum)
 Please come to [facility] (this facility) during walk in hours for appointment with psychiatrist for further medication management and for therapists for therapy.   Walk in hours are 8-11 AM Monday through Thursday for medication management. Therapy walk in hours are Monday-Wednesday 8 AM-1PM.    It is first come, first -serve; it is best to arrive by 7:00 AM.   On Friday from 1 pm to 4 pm for therapy intake only. Please arrive by 12:00 pm as it is  first come, first -serve.    When you arrive please go upstairs for your appointment. If you are unsure of where to go, inform the front desk that you are here for a walk in appointment and they will assist you with directions upstairs.  Address:  [address] in [place], [address] Ph: [phone]    Follow-up recommendations:  Activity:  Normal, as tolerated Diet:  Per PCP recommendation  Patient is instructed prior to discharge to: Take all medications as prescribed by her mental healthcare provider. Report any adverse effects and/or reactions from the medicines to her outpatient provider promptly. Patient has been instructed & cautioned: To not engage in alcohol and or illegal drug use while on prescription medicines.  In the event of worsening symptoms, patient is instructed to call the crisis hotline at 988, 911 and or go to the nearest ED for appropriate evaluation and treatment of symptoms. To follow-up with her primary care provider for your other medical issues, concerns and or health care needs.      Based on the information that you have provided and the presenting issues outpatient services and resources for have been recommended.  It is imperative that you follow through with treatment recommendations within 5-7 days from the of discharge to mitigate further risk to your safety and mental well-being. A list of referrals has been provided below to get you started.  You are not limited to the list  provided.  In case of an urgent crisis, you may contact the Mobile Crisis Unit with [facility] at [phone].  [facility] Therapists in [place], [place]  Audree Leas Clinical Social Work/Therapist, MSW, Nancey Awk[facility] [place], [place] [address]  [phone] I have over 24 years of experience working with patients with Mental Health Disorders and Developmental Disorders, with an emphasis on working with adult males with Dual Diagnosis (Developmental Disabilities and Mental Health Disorders) as well as men with relationship/infidelity issues. I enjoy assisting people set reasonable goals and achieving their goals. I am trained in Trauma Focused Cognitive Behavioral Therapy through the [facility]. I am a Materials engineer. I am also a Certified Hypnotherapist. If I can assist you in any way please do not hesitate to call.     Gayl Katos Counselor, [facility] [place], [place] [address]  I am a Licensed Mental Health Counselor in [place]  with over 8 years of experience working with Children and Adults. I am also a licensed Chaplain and Ordained Geralynn Knife providing spiritual counseling for over 25 years. I have worked with clients with a wide range of concerns including depression, anxiety, relationship issues, parenting problems, career challenges, OCD, and ADHD, PTSD and trauma. I also helped many people who have experienced physical trauma or emotional abuse. [phone]    Gaylyn Keas Clinical Social Work/Therapist, MSW, [place], [place] [address]  [phone]   Building trust, respect, and connection is vital in therapy, and I embrace partnering together to create a unique therapy experience  that focuses on your needs. I have 10 years of experience with mental health therapy, and I specialize in trauma informed therapy for all ages and play therapy with children. Whether you're looking for help with everyday anxiety, depression, or life transitions  or you're seeking support for trauma, mood disorders, or therapy for your child or teen, I'm here.      [facility] Clinical Social Work/Therapist, Tedi Fearing [place], [place] [address]  [phone]  My ideal client doesn't have all the answers and may not even know where to start. My ideal client may feel unworthy or undeserving. My ideal client may also struggle with settling for less. My ideal client wants more but there is something within them that is holding them back. My ideal client may struggle with grief or have relationship issues. My ideal client wants more. I hold pride in the fact that I provide a Safe Space for people to share things that they aren't comfortable sharing with anyone else. My ideal client understands that they have to be the change that they want to see.      Kalyn Hamilton Counselor, MS/EdS, [facility], [facility] [place], [place] [address]  [phone] Please allow me to commend you for seeking out therapy: I am very glad that you are recognizing a personal need to find support in a mental health professional. Whether or not you decide that I am a good therapeutic fit for you and your needs, I believe wholeheartedly that every person can benefit from therapy at some point in their lives. It can be supremely beneficial to have a caring but impartial perspective in your life to help you heal, grow, and fulfill your own personal needs/goals.     Aj (Anjanette) Tschupp Clinical Social Kevan Peers [place], [place] [address]  [phone] I come from a strengths-based and trauma focused perspective. Whether this is your first time looking for a therapist or a "last ditch effort" to get help, I believe I can support you. When you are working with me, and yes, therapy is work, it is my hope that you will feel warmth, genuineness, and judgment-free compassion from me. Just the concept of finally feeling heard can be quite powerful in itself. I am trained in a number of  therapeutic approaches but I do not believe in a cookie-cutter approach to clients, and will tailor my interventions to your needs. While techniques and interventions are important, the rapport and relationship between therapist and client is what is most powerful.    Vina Greaves Licensed Professional Counselor, MA, LCMHS, [facility] [place], [place] [address]  [phone]  Navigating through difficult life experiences, relationships, or seeking personal growth can manifest stress, frustration, guilt, anxiety, anger, etc. My passion for supporting you provides an empowering and hopeful path to healing, growth and wellness. My intention is to help you recognize that you are enough, loved, heard, and worthy. It is a great privilege to walk alongside you as you make connections, gain clarity and acquire skills to promote growth. As a former Programmer, systems, I also believe in providing psychoeducation to help you better understand the underlying reasons behind your conflicting experiences and thoughts.    Maryfrances Snell Clinical Social Work/Therapist, MSW, [place], [place] [address]  [phone] I specialize in Trauma focused Cognitive Behavioral Therapy for children ages 24-18 (Waitlist at this time) , couple's therapy, and individual therapy. Currently accepting with immediate availability. I received my bachelor's in Social Work from [employer] in 2016 and then went on  to graduate from the Designer, fashion/clothing of Social Work program at [employer] in 2018. Since graduating I have been dedicated to working with diverse populations including but not limited to IDD, [facility], Refugees/Immigrants, HIV/AIDS and the Homeless communities. Diversity and inclusion are a strong passion for me, and I work hard to advocate for various communities. I am an advocate for LGBTQIA+, women's issues and look forward to working with individuals, and couples from various backgrounds.

## 2024-03-18 ENCOUNTER — Telehealth: Payer: Self-pay | Admitting: *Deleted

## 2024-03-18 NOTE — Telephone Encounter (Signed)
 I called pt left message. Pt need to sign a release form.

## 2024-03-18 NOTE — Telephone Encounter (Signed)
 Pt is asking for a call back from Dr Gracie Lav

## 2024-03-18 NOTE — Telephone Encounter (Signed)
 Patient was contacted on two consecutive days and reached today. She reported that she never completed the brain MRI with and without contrast that was ordered during her initial visit in February 2025. Despite multiple attempts by the office to assist with scheduling, we were previously unable to reach her.  During today's conversation, patient expressed preference for an open MRI. I offered assistance in arranging this, but she stated she refused, plans to seek care at a different office.  She also reported regular follow-ups with an ophthalmologist every three months, with the most recent evaluation showing no evidence of papilledema.  Patient expressed dissatisfaction with the care received at [facility] and requested to terminate the patient-provider relationship. Office will proceed with sending a dismissal letter.

## 2024-03-31 ENCOUNTER — Telehealth: Payer: Self-pay | Admitting: *Deleted

## 2024-03-31 ENCOUNTER — Telehealth: Payer: Self-pay

## 2024-03-31 NOTE — Telephone Encounter (Signed)
 Received call from congregational nurse Leobardo Rainier, she was inquiring on if patient could be seen for concussion symptoms. I advised patient was previously dismissed from practice and reviewed timeline of patient care while active patient. Advised patient would need to see PCP for new referral to new neurology practice. Caryl Clas verbalized understanding and appreciative of information

## 2024-03-31 NOTE — Telephone Encounter (Signed)
 I called pt cell phone and home number not able to leave a voicemail. Pt cd ready for p/u @ the front desk.

## 2024-03-31 NOTE — Congregational Nurse Program (Signed)
  Dept: [phone]   Congregational Nurse Program Note  Date of Encounter: 03/31/2024  Referred to [facility] nurse clinic by [facility] case manager for complaints of headache, vision difficulty and nausea post concussion.  States that she fell while at [facility] about 4 weeks ago.  Initially was not diagnosed as a concussion.  Spoke with [facility] nurse April, resident has been d/c from that practice.  Left message for PCP practice triage nurse to determine best referral for the continued symptoms.    Noted to have multiple small superficial lacerations left forearm that she states came from cutting herself.  Resident spoke with her psychiatrist during the visit and made follow-up appointment.  Denies that she is going to harm herself, states she just wants to feel better. Past Medical History: Past Medical History:  Diagnosis Date   Abdominal pain, suprapubic 06/04/2016   Abnormal Pap smear of cervix    Acute cystitis 03/10/2018   Anxiety    Arthritis    C. difficile diarrhea 06/12/2018   Cataract    Chest pain 03/28/2018   Depression    Depression with suicidal ideation 03/09/2018   Diarrhea 06/13/2018   GERD (gastroesophageal reflux disease)    History of kidney stones    Hypomagnesemia 06/13/2018   Kidney stone    Low back pain 07/22/2012   Migraine    Non-suicidal self harm as coping mechanism (HCC) 12/25/2019   Oral thrush 06/12/2018   Ovarian cyst    PCOS (polycystic ovarian syndrome)    PTSD (post-traumatic stress disorder)    Scarlet fever    Sleep apnea    Substance abuse (HCC)    Suicidal ideation 04/12/2018    Encounter Details:  Community Questionnaire - 03/31/24 1515       Questionnaire   Ask client: Do you give verbal consent for me to treat you today? Yes    Student Assistance N/A    [place]    Encounter Setting CN site    Population Status Unknown   Has apartment at  [facility];Medicare    Insurance/Financial Assistance Referral N/A    Medication Patient Medications Reviewed    Medical Provider Yes    Screening Referrals Made N/A    Medical Referrals Made Non-[facility] PCP/Clinic    Medical Appointment Completed Non-[facility] PCP/Clinic    CNP Interventions Advocate/Support;Counsel;Navigate Healthcare System;Case Management    Screenings CN Performed Blood Pressure    ED Visit Averted N/A    Life-Saving Intervention Made N/A

## 2024-04-09 ENCOUNTER — Emergency Department (EMERGENCY_DEPARTMENT_HOSPITAL)
Admission: EM | Admit: 2024-04-09 | Discharge: 2024-04-10 | Disposition: A | Discharge status: Other Acute Inpatient Hospital | Source: Home / Self Care | Attending: Emergency Medicine | Admitting: Emergency Medicine

## 2024-04-09 ENCOUNTER — Other Ambulatory Visit: Payer: Self-pay

## 2024-04-09 DIAGNOSIS — F39 Unspecified mood [affective] disorder: Secondary | ICD-10-CM | POA: Diagnosis present

## 2024-04-09 DIAGNOSIS — F603 Borderline personality disorder: Secondary | ICD-10-CM | POA: Insufficient documentation

## 2024-04-09 DIAGNOSIS — X789XXA Intentional self-harm by unspecified sharp object, initial encounter: Secondary | ICD-10-CM | POA: Insufficient documentation

## 2024-04-09 DIAGNOSIS — Z7289 Other problems related to lifestyle: Secondary | ICD-10-CM

## 2024-04-09 DIAGNOSIS — F329 Major depressive disorder, single episode, unspecified: Secondary | ICD-10-CM | POA: Diagnosis not present

## 2024-04-09 DIAGNOSIS — S41111A Laceration without foreign body of right upper arm, initial encounter: Secondary | ICD-10-CM | POA: Insufficient documentation

## 2024-04-09 DIAGNOSIS — R45851 Suicidal ideations: Secondary | ICD-10-CM | POA: Insufficient documentation

## 2024-04-09 DIAGNOSIS — S41112A Laceration without foreign body of left upper arm, initial encounter: Secondary | ICD-10-CM | POA: Insufficient documentation

## 2024-04-09 DIAGNOSIS — T148XXA Other injury of unspecified body region, initial encounter: Secondary | ICD-10-CM

## 2024-04-09 LAB — COMPREHENSIVE METABOLIC PANEL WITH GFR
ALT: 39 U/L (ref 0–44)
AST: 30 U/L (ref 15–41)
Albumin: 3.9 g/dL (ref 3.5–5.0)
Alkaline Phosphatase: 83 U/L (ref 38–126)
Anion gap: 12 (ref 5–15)
BUN: 18 mg/dL (ref 6–20)
CO2: 24 mmol/L (ref 22–32)
Calcium: 9.2 mg/dL (ref 8.9–10.3)
Chloride: 103 mmol/L (ref 98–111)
Creatinine, Ser: 1.02 mg/dL — ABNORMAL HIGH (ref 0.44–1.00)
GFR, Estimated: >60 mL/min (ref >60)
Glucose, Bld: 91 mg/dL (ref 70–99)
Potassium: 3.7 mmol/L (ref 3.5–5.1)
Sodium: 139 mmol/L (ref 135–145)
Total Bilirubin: 1.2 mg/dL (ref 0.0–1.2)
Total Protein: 7.1 g/dL (ref 6.5–8.1)

## 2024-04-09 LAB — CBC WITH DIFFERENTIAL/PLATELET
Abs Immature Granulocytes: 0.04 10*3/uL (ref 0.00–0.07)
Basophils Absolute: 0 10*3/uL (ref 0.0–0.1)
Basophils Relative: 0 %
Eosinophils Absolute: 0.1 10*3/uL (ref 0.0–0.5)
Eosinophils Relative: 1 %
HCT: 43.4 % (ref 36.0–46.0)
Hemoglobin: 14.4 g/dL (ref 12.0–15.0)
Immature Granulocytes: 0 %
Lymphocytes Relative: 26 %
Lymphs Abs: 3 10*3/uL (ref 0.7–4.0)
MCH: 30.5 pg (ref 26.0–34.0)
MCHC: 33.2 g/dL (ref 30.0–36.0)
MCV: 91.9 fL (ref 80.0–100.0)
Monocytes Absolute: 0.6 10*3/uL (ref 0.1–1.0)
Monocytes Relative: 5 %
Neutro Abs: 7.6 10*3/uL (ref 1.7–7.7)
Neutrophils Relative %: 68 %
Platelets: 295 10*3/uL (ref 150–400)
RBC: 4.72 MIL/uL (ref 3.87–5.11)
RDW: 12.2 % (ref 11.5–15.5)
WBC: 11.3 10*3/uL — ABNORMAL HIGH (ref 4.0–10.5)
nRBC: 0 % (ref 0.0–0.2)

## 2024-04-09 LAB — RAPID URINE DRUG SCREEN, HOSP PERFORMED
Amphetamines: NOT DETECTED
Barbiturates: NOT DETECTED
Benzodiazepines: POSITIVE — AB
Cocaine: NOT DETECTED
Opiates: NOT DETECTED
Tetrahydrocannabinol: NOT DETECTED

## 2024-04-09 LAB — URINALYSIS, ROUTINE W REFLEX MICROSCOPIC
Bilirubin Urine: NEGATIVE
Glucose, UA: NEGATIVE mg/dL
Ketones, ur: NEGATIVE mg/dL
Nitrite: NEGATIVE
Protein, ur: NEGATIVE mg/dL
Specific Gravity, Urine: 1.031 — ABNORMAL HIGH (ref 1.005–1.030)
pH: 5 (ref 5.0–8.0)

## 2024-04-09 LAB — ETHANOL: Alcohol, Ethyl (B): <15 mg/dL (ref <15)

## 2024-04-09 LAB — MAGNESIUM: Magnesium: 2 mg/dL (ref 1.7–2.4)

## 2024-04-09 LAB — HCG, SERUM, QUALITATIVE: Preg, Serum: NEGATIVE

## 2024-04-09 MED ORDER — ARIPIPRAZOLE 5 MG PO TABS
10.0000 mg | ORAL_TABLET | Freq: Every day | ORAL | Status: DC
  Administered 2024-04-10: 5 mg via ORAL
  Filled 2024-04-09 (×2): qty 2

## 2024-04-09 MED ORDER — IBUPROFEN 200 MG PO TABS
600.0000 mg | ORAL_TABLET | Freq: Four times a day (QID) | ORAL | Status: DC | PRN
  Administered 2024-04-09 – 2024-04-10 (×3): 600 mg via ORAL
  Filled 2024-04-09 (×3): qty 3

## 2024-04-09 MED ORDER — HYDROXYZINE HCL 25 MG PO TABS
25.0000 mg | ORAL_TABLET | Freq: Three times a day (TID) | ORAL | Status: DC | PRN
  Administered 2024-04-09 – 2024-04-10 (×3): 25 mg via ORAL
  Filled 2024-04-09 (×3): qty 1

## 2024-04-09 MED ORDER — ALBUTEROL SULFATE HFA 108 (90 BASE) MCG/ACT IN AERS: 1.0000 | INHALATION_SPRAY | RESPIRATORY_TRACT | Status: DC | PRN

## 2024-04-09 MED ORDER — POLYETHYLENE GLYCOL 3350 17 G PO PACK: 17.0000 g | PACK | Freq: Every day | ORAL | Status: DC | PRN

## 2024-04-09 MED ORDER — BACITRACIN ZINC 500 UNIT/GM EX OINT
TOPICAL_OINTMENT | Freq: Two times a day (BID) | CUTANEOUS | Status: DC
  Administered 2024-04-09: 1 via TOPICAL
  Filled 2024-04-09: qty 0.9

## 2024-04-09 MED ORDER — ESCITALOPRAM OXALATE 10 MG PO TABS
10.0000 mg | ORAL_TABLET | Freq: Every day | ORAL | Status: DC
  Administered 2024-04-09: 10 mg via ORAL
  Filled 2024-04-09 (×2): qty 1

## 2024-04-09 MED ORDER — ACETAMINOPHEN 325 MG PO TABS
650.0000 mg | ORAL_TABLET | ORAL | Status: DC | PRN
  Administered 2024-04-09 – 2024-04-10 (×3): 650 mg via ORAL
  Filled 2024-04-09 (×4): qty 2

## 2024-04-09 NOTE — ED Provider Notes (Addendum)
 [place] [facility] EMERGENCY DEPARTMENT AT [facility] Provider Note   CSN: [id number] Arrival date & time: 04/09/24  1555     Patient presents with: Suicidal   Rebekah Davis is a 38 y.o. female.   Pt is a 38 yo with pmhx significant for migraines, arthritis, pcos, depression, gerd, ptsd, and depression.  Pt presents to the ED today with suicidal thoughts.  She did cut both arms last night.  She said she does not remember doing it.  She said tetanus is utd. Pt said she has no hope.  She does not know how to get it back.  She has been taking her meds as directed.       Prior to Admission medications   Medication Sig Start Date End Date Taking? Authorizing Provider  methocarbamol  (ROBAXIN ) 500 MG tablet Take 500 mg by mouth every 8 (eight) hours as needed for muscle spasms. 03/08/24  Yes [provider]  polyethylene glycol powder (GLYCOLAX /MIRALAX ) 17 GM/SCOOP powder Take 17 g by mouth daily as needed for mild constipation or moderate constipation.   Yes [provider]  sennosides-docusate sodium (SENOKOT-S) 8.6-50 MG tablet Take 1 tablet by mouth daily.   Yes [provider]  albuterol  (VENTOLIN  HFA) 108 (90 Base) MCG/ACT inhaler Inhale 1 puff into the lungs every 4 (four) hours as needed. 02/14/24     ARIPiprazole  (ABILIFY ) 5 MG tablet Take 1 tablet (5 mg total) by mouth at bedtime for mood. Patient taking differently: Take 5 mg by mouth every morning. 12/28/23     cyclobenzaprine  (FLEXERIL ) 5 MG tablet Take 1 tablet (5 mg total) by mouth at bedtime as needed. 02/17/24     ergocalciferol  (VITAMIN D2) 1.25 MG (50000 UT) capsule Take 1 capsule (50,000 Units total) by mouth once a week. 10/23/23     hydrOXYzine  (ATARAX ) 25 MG tablet Take 1 tablet (25 mg total) by mouth 3 (three) times daily as needed for anxiety. Patient taking differently: Take 25 mg by mouth 3 (three) times daily as needed for anxiety. And at bedtime as needed 11/21/22   Dasie Ellouise CROME, FNP   ibuprofen  (ADVIL ) 800 MG tablet Take 800 mg by mouth 3 (three) times daily.    [provider]  lamoTRIgine  (LAMICTAL ) 100 MG tablet Take 1 tablet (100 mg total) by mouth 2 (two) times daily. 11/26/23   Onita Duos, MD  levonorgestrel (MIRENA, 52 MG,) 20 MCG/DAY IUD 1 each by Intrauterine route once.    [provider]  LORazepam  (ATIVAN ) 0.5 MG tablet Take 1-2 tablets (0.5-1 mg total) by mouth every 4-6 hours as needed for severe panic attacks. Patient taking differently: Take 0.25-0.5 mg by mouth every 4 (four) hours as needed for anxiety. 02/18/24     phenazopyridine  (PYRIDIUM ) 100 MG tablet Take 1 tablet (100 mg total) by mouth 2 (two) times daily for Dysuria. 12/28/23     tamsulosin  (FLOMAX ) 0.4 MG CAPS capsule Take 1 capsule (0.4 mg total) by mouth daily. Patient not taking: Reported on 04/09/2024 01/24/24       Allergies: Adhesive [tape], Cherry, Sulfamethoxazole-trimethoprim, Levofloxacin, Morphine, Tizanidine, Gabapentin, Metformin, Sulfa antibiotics, and Sumatriptan    Review of Systems  Psychiatric/Behavioral:  Positive for suicidal ideas.   All other systems reviewed and are negative.   Updated Vital Signs BP 136/80   Pulse (!) 110   Temp 98.7 F (37.1 C) (Oral)   Resp 20   Ht 5' 5 (1.651 m)   Wt (!) 147.9 kg   SpO2  98%   BMI 54.25 kg/m   Physical Exam Vitals and nursing note reviewed.  Constitutional:      Appearance: Normal appearance.  HENT:     Head: Normocephalic and atraumatic.     Right Ear: External ear normal.     Left Ear: External ear normal.     Nose: Nose normal.     Mouth/Throat:     Mouth: Mucous membranes are dry.   Eyes:     Extraocular Movements: Extraocular movements intact.     Conjunctiva/sclera: Conjunctivae normal.     Pupils: Pupils are equal, round, and reactive to light.    Cardiovascular:     Rate and Rhythm: Normal rate and regular rhythm.     Pulses: Normal pulses.     Heart sounds: Normal heart sounds.   Pulmonary:     Effort: Pulmonary effort is normal.     Breath sounds: Normal breath sounds.  Abdominal:     General: Abdomen is flat.     Palpations: Abdomen is soft.   Musculoskeletal:        General: Normal range of motion.     Cervical back: Normal range of motion and neck supple.     Comments: Cast on left foot (recent achilles tendon repair)   Skin:    General: Skin is warm.     Capillary Refill: Capillary refill takes less than 2 seconds.     Comments: Multiple superficial lacs to both arms   Neurological:     General: No focal deficit present.     Mental Status: She is alert and oriented to person, place, and time.   Psychiatric:        Mood and Affect: Affect is flat.        Thought Content: Thought content includes suicidal ideation.     (all labs ordered are listed, but only abnormal results are displayed) Labs Reviewed  COMPREHENSIVE METABOLIC PANEL WITH GFR - Abnormal; Notable for the following components:      Result Value   Creatinine, Ser 1.02 (*)    All other components within normal limits  RAPID URINE DRUG SCREEN, HOSP PERFORMED - Abnormal; Notable for the following components:   Benzodiazepines POSITIVE (*)    All other components within normal limits  CBC WITH DIFFERENTIAL/PLATELET - Abnormal; Notable for the following components:   WBC 11.3 (*)    All other components within normal limits  URINALYSIS, ROUTINE W REFLEX MICROSCOPIC - Abnormal; Notable for the following components:   APPearance HAZY (*)    Specific Gravity, Urine 1.031 (*)    Hgb urine dipstick SMALL (*)    Leukocytes,Ua TRACE (*)    Bacteria, UA RARE (*)    All other components within normal limits  ETHANOL  HCG, SERUM, QUALITATIVE  MAGNESIUM     EKG: None  Radiology: No results found.   Procedures   Medications Ordered in the ED  acetaminophen  (TYLENOL ) tablet 650 mg (650 mg Oral Given 04/09/24 1802)  escitalopram  (LEXAPRO ) tablet 10 mg (has no administration in time  range)  ARIPiprazole  (ABILIFY ) tablet 10 mg (has no administration in time range)  hydrOXYzine  (ATARAX ) tablet 25 mg (has no administration in time range)  bacitracin  ointment (has no administration in time range)                                    Medical Decision Making Amount and/or Complexity of Data  Reviewed Labs: ordered.  Risk OTC drugs. Prescription drug management.   This patient presents to the ED for concern of suicidal ideation, this involves an extensive number of treatment options, and is a complaint that carries with it a high risk of complications and morbidity.  The differential diagnosis includes depression, si   Co morbidities that complicate the patient evaluation  migraines, arthritis, pcos, depression, gerd, ptsd, and depression   Additional history obtained:  Additional history obtained from epic chart review External records from outside source obtained and reviewed including EMS report   Lab Tests:  I Ordered, and personally interpreted labs.  The pertinent results include:  cbc nl, cmp nl, mg nl, preg neg, etoh neg, ua nl, uds + bzd   Medicines ordered and prescription drug management:   I have reviewed the patients home medicines and have made adjustments as needed  Consultations Obtained:  I requested consultation with TTS,  and discussed lab and imaging findings as well as pertinent plan -TTS recommends inpatient psych.  Pt is voluntary.   Problem List / ED Course:  SI   Reevaluation:  After the interventions noted above, I reevaluated the patient and found that they have :improved   Social Determinants of Health:  Lives at home   Dispostion:  After consideration of the diagnostic results and the patients response to treatment, I feel that the patent would benefit from inpatient psych     Final diagnoses:  Suicidal ideation  Superficial laceration    ED Discharge Orders     None          Dean Clarity,  MD 04/09/24 2029    Dean Clarity, MD 04/09/24 2052

## 2024-04-09 NOTE — ED Triage Notes (Signed)
 [facility] for suicidal thoughts for a month with a plan to cut herself until she can't feel anything. Noted for have self-inflicted cuts from razor blade. Patient appears awake, alert and oriented. Breathing even and unlabored.   BP=140/66 HR=100 Saturation=99% (room air)

## 2024-04-09 NOTE — Progress Notes (Addendum)
  Pt has been accepted [facility]. Bed assignment:Adult/Main   Pt meets inpatient criteria per Cathaleen Adam, PMHNP.   Attending Physician will be Dr. Danice Day   Report can be called to: [phone]  Pt can arrive after 8AM  Care Team Notified: Norleen Olp, RN    Update: [facility] has rescinded bed offer.   Pt has been accepted to [facility] on 04/09/2024. Bed assignment:[place] 3E  Pt meets inpatient criteria per Cathaleen Adam, PMHNP.   Attending Physician will be Dr. Carmin   Report can be called to: [phone]   Pt can arrive after 9AM   Care Team Notified: Norleen Olp, RN

## 2024-04-09 NOTE — Progress Notes (Signed)
 Inpatient Psychiatric Referral  Patient was recommended inpatient per Jadeka Motley-Mangrum, PMHNP. There are no available beds at [facility], per [facility] Luke Sprang, RN. Patient was referred to the following out of network facilities:  Destination  Service Provider Request Status Address Phone Fax  [facility]  Pending - Request Sent [address], [place] [address] [phone]  [facility]  Pending - Request Sent [address] [place] [address] [phone]  [facility]  Pending - Request Sent [address] [place] [address] [phone]  [facility]  Pending - Request Sent [address] [place] [address] [phone]  [facility]  Pending - Request Sent [address] [place] [address] [phone]  [facility]  Pending - Request Sent [address] [place] [address] [phone]  [facility]  Pending - Request Sent [address] [place] [phone]  [facility]  Pending - Request Sent [address] [place] [address] [phone]    Situation ongoing, CSW to continue following and update chart as more information becomes available.  Harrie Sofia MSW, LCSWA 04/09/2024  9:43PM
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
## 2024-04-10 ENCOUNTER — Emergency Department (HOSPITAL_COMMUNITY)
Admission: EM | Admit: 2024-04-10 | Discharge: 2024-04-15 | Disposition: A | Discharge status: Home / Self Care | Attending: Emergency Medicine | Admitting: Emergency Medicine

## 2024-04-10 ENCOUNTER — Inpatient Hospital Stay (HOSPITAL_COMMUNITY)
Admission: AD | Admit: 2024-04-10 | Discharge: 2024-04-10 | DRG: 881 | Disposition: A | Discharge status: Home / Self Care | Source: Intra-hospital

## 2024-04-10 ENCOUNTER — Other Ambulatory Visit: Payer: Self-pay

## 2024-04-10 ENCOUNTER — Encounter (HOSPITAL_COMMUNITY): Payer: Self-pay | Admitting: Emergency Medicine

## 2024-04-10 DIAGNOSIS — Z6841 Body Mass Index (BMI) 40.0 and over, adult: Secondary | ICD-10-CM | POA: Diagnosis not present

## 2024-04-10 DIAGNOSIS — Z87820 Personal history of traumatic brain injury: Secondary | ICD-10-CM

## 2024-04-10 DIAGNOSIS — Z833 Family history of diabetes mellitus: Secondary | ICD-10-CM | POA: Diagnosis not present

## 2024-04-10 DIAGNOSIS — Z91048 Other nonmedicinal substance allergy status: Secondary | ICD-10-CM

## 2024-04-10 DIAGNOSIS — Z841 Family history of disorders of kidney and ureter: Secondary | ICD-10-CM

## 2024-04-10 DIAGNOSIS — Z882 Allergy status to sulfonamides status: Secondary | ICD-10-CM | POA: Diagnosis not present

## 2024-04-10 DIAGNOSIS — Z9151 Personal history of suicidal behavior: Secondary | ICD-10-CM | POA: Diagnosis not present

## 2024-04-10 DIAGNOSIS — F603 Borderline personality disorder: Secondary | ICD-10-CM | POA: Insufficient documentation

## 2024-04-10 DIAGNOSIS — M79662 Pain in left lower leg: Secondary | ICD-10-CM | POA: Diagnosis present

## 2024-04-10 DIAGNOSIS — Z91018 Allergy to other foods: Secondary | ICD-10-CM | POA: Diagnosis not present

## 2024-04-10 DIAGNOSIS — Z9152 Personal history of nonsuicidal self-harm: Secondary | ICD-10-CM | POA: Diagnosis not present

## 2024-04-10 DIAGNOSIS — F419 Anxiety disorder, unspecified: Secondary | ICD-10-CM | POA: Diagnosis present

## 2024-04-10 DIAGNOSIS — Z79899 Other long term (current) drug therapy: Secondary | ICD-10-CM | POA: Diagnosis not present

## 2024-04-10 DIAGNOSIS — F329 Major depressive disorder, single episode, unspecified: Principal | ICD-10-CM | POA: Diagnosis present

## 2024-04-10 DIAGNOSIS — F39 Unspecified mood [affective] disorder: Secondary | ICD-10-CM | POA: Insufficient documentation

## 2024-04-10 DIAGNOSIS — K219 Gastro-esophageal reflux disease without esophagitis: Secondary | ICD-10-CM | POA: Diagnosis present

## 2024-04-10 DIAGNOSIS — Z8249 Family history of ischemic heart disease and other diseases of the circulatory system: Secondary | ICD-10-CM | POA: Diagnosis not present

## 2024-04-10 DIAGNOSIS — Z881 Allergy status to other antibiotic agents status: Secondary | ICD-10-CM

## 2024-04-10 DIAGNOSIS — R45851 Suicidal ideations: Secondary | ICD-10-CM | POA: Diagnosis present

## 2024-04-10 DIAGNOSIS — S50812A Abrasion of left forearm, initial encounter: Secondary | ICD-10-CM | POA: Diagnosis not present

## 2024-04-10 DIAGNOSIS — X58XXXA Exposure to other specified factors, initial encounter: Secondary | ICD-10-CM | POA: Diagnosis not present

## 2024-04-10 DIAGNOSIS — R4589 Other symptoms and signs involving emotional state: Secondary | ICD-10-CM | POA: Diagnosis not present

## 2024-04-10 DIAGNOSIS — Y92019 Unspecified place in single-family (private) house as the place of occurrence of the external cause: Secondary | ICD-10-CM | POA: Diagnosis not present

## 2024-04-10 DIAGNOSIS — F4310 Post-traumatic stress disorder, unspecified: Secondary | ICD-10-CM | POA: Diagnosis present

## 2024-04-10 DIAGNOSIS — S50811A Abrasion of right forearm, initial encounter: Secondary | ICD-10-CM | POA: Diagnosis not present

## 2024-04-10 DIAGNOSIS — Z8041 Family history of malignant neoplasm of ovary: Secondary | ICD-10-CM | POA: Diagnosis not present

## 2024-04-10 DIAGNOSIS — Z823 Family history of stroke: Secondary | ICD-10-CM

## 2024-04-10 DIAGNOSIS — Z7289 Other problems related to lifestyle: Secondary | ICD-10-CM

## 2024-04-10 DIAGNOSIS — S59911A Unspecified injury of right forearm, initial encounter: Secondary | ICD-10-CM | POA: Diagnosis present

## 2024-04-10 DIAGNOSIS — Z885 Allergy status to narcotic agent status: Secondary | ICD-10-CM

## 2024-04-10 DIAGNOSIS — Z818 Family history of other mental and behavioral disorders: Secondary | ICD-10-CM

## 2024-04-10 DIAGNOSIS — Z888 Allergy status to other drugs, medicaments and biological substances status: Secondary | ICD-10-CM | POA: Diagnosis not present

## 2024-04-10 MED ORDER — VITAMIN D (ERGOCALCIFEROL) 1.25 MG (50000 UNIT) PO CAPS
50000.0000 [IU] | ORAL_CAPSULE | ORAL | Status: DC
  Filled 2024-04-10: qty 1

## 2024-04-10 MED ORDER — IBUPROFEN 200 MG PO TABS
600.0000 mg | ORAL_TABLET | Freq: Three times a day (TID) | ORAL | Status: DC | PRN
  Administered 2024-04-10 – 2024-04-11 (×3): 600 mg via ORAL
  Filled 2024-04-10 (×3): qty 3

## 2024-04-10 MED ORDER — ALUM & MAG HYDROXIDE-SIMETH 200-200-20 MG/5ML PO SUSP: 30.0000 mL | Freq: Four times a day (QID) | ORAL | Status: DC | PRN

## 2024-04-10 MED ORDER — LORAZEPAM 1 MG PO TABS
1.0000 mg | ORAL_TABLET | Freq: Every day | ORAL | Status: AC
  Administered 2024-04-10 – 2024-04-12 (×3): 1 mg via ORAL
  Filled 2024-04-10 (×3): qty 1

## 2024-04-10 MED ORDER — ARIPIPRAZOLE 5 MG PO TABS
5.0000 mg | ORAL_TABLET | Freq: Every day | ORAL | Status: DC
  Filled 2024-04-10: qty 1

## 2024-04-10 MED ORDER — ACETAMINOPHEN 500 MG PO TABS
1000.0000 mg | ORAL_TABLET | Freq: Three times a day (TID) | ORAL | Status: DC | PRN
  Administered 2024-04-10 – 2024-04-11 (×3): 1000 mg via ORAL
  Filled 2024-04-10 (×3): qty 2

## 2024-04-10 MED ORDER — SENNOSIDES-DOCUSATE SODIUM 8.6-50 MG PO TABS
1.0000 | ORAL_TABLET | Freq: Every evening | ORAL | Status: DC | PRN
  Administered 2024-04-11 – 2024-04-15 (×3): 1 via ORAL
  Filled 2024-04-10 (×3): qty 1

## 2024-04-10 MED ORDER — ONDANSETRON HCL 4 MG PO TABS: 4.0000 mg | ORAL_TABLET | Freq: Three times a day (TID) | ORAL | Status: DC | PRN

## 2024-04-10 MED ORDER — ARIPIPRAZOLE 5 MG PO TABS
5.0000 mg | ORAL_TABLET | Freq: Every day | ORAL | Status: DC
  Administered 2024-04-11 – 2024-04-15 (×5): 5 mg via ORAL
  Filled 2024-04-10 (×6): qty 1

## 2024-04-10 MED ORDER — HYDROXYZINE HCL 25 MG PO TABS
25.0000 mg | ORAL_TABLET | Freq: Three times a day (TID) | ORAL | Status: DC | PRN
  Administered 2024-04-10 – 2024-04-15 (×8): 25 mg via ORAL
  Filled 2024-04-10 (×8): qty 1

## 2024-04-10 MED ORDER — ESCITALOPRAM OXALATE 10 MG PO TABS
10.0000 mg | ORAL_TABLET | Freq: Every day | ORAL | Status: DC
  Filled 2024-04-10: qty 1

## 2024-04-10 NOTE — ED Notes (Signed)
 Pt being cooperative.  Pt asking when she will be transferred.  Paperwork is here.  I advised pt it will not be too much longer.  Pt aslo ask for a sandwich and water which were given to her.  She has not eaten much of her meals at breakfast or lunch.
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
## 2024-04-10 NOTE — Progress Notes (Signed)
 Inpatient Psychiatric Referral  Patient was recommended inpatient.There are no available beds at [facility], per [facility] Burnard Barter, RN. Patient was referred to the following out of network facilities:  Destination  Service Provider Request Status Address Phone Fax  [facility]  Pending - Request Sent [address], [place] [address] [phone]  [facility]  Pending - Request Sent [address], [place] [address] [phone]  [facility]  Pending - Request Sent [address] [place], [place] [address] [phone]  [facility]  Pending - Request Sent [address] [place] [address] [phone]  [facility]  Pending - Request Sent [address] [place] [address] [phone]  [facility]  Pending - Request Sent [address] [place] [address] [phone]  [facility]  Pending - Request Sent [address] [place] [address] [phone]  [facility]  Pending - Request Sent [address], [place] [address] [phone]  [facility]  Pending - Request Sent [address] [place] [phone]  [facility]  Pending - Request Sent [address] [place] [address] [phone]  [facility]  Pending - Request Sent [place] [phone]  [facility]  Pending - No Request Sent [address] [place], [place] [address] [phone]    Situation ongoing, CSW to continue following and update chart as more information becomes available.   Harrie Sofia MSW, LCSWA 04/10/2024  6:35PM

## 2024-04-10 NOTE — ED Notes (Signed)
 Pt was given ham sandwich and orange juice.

## 2024-04-10 NOTE — ED Provider Notes (Signed)
 There were no vitals taken for this visit.  In short, Rebekah Davis is a 38 y.o. female with a chief complaint of Suicidal and IVC .  Refer to the original H&P for additional details.  04:57 PM  Patient returned from [facility] as they are unable to care for her with her leg injury. Patient with superficial scrapes to her left forearm. These are superficial and do not require closure. Will provide basic wound care. Home medications re-ordered.     Darra Fonda MATSU, MD 04/10/24 [phone]

## 2024-04-10 NOTE — ED Notes (Signed)
 PT refusing to take Abilify  and Lexapro  because she says she does not take one of them and its the wrong dose of the other one. She states she only takes 5 mg of the Abilify  and so she refuses to take these Rx at this time.  She is requesting to talk with someone about these medications.

## 2024-04-10 NOTE — Progress Notes (Signed)
 CSW spoke to intake at [facility], at this time they can no longer accept the patient due to an expected discharge that now is not happening. CSW will continue to seek placement for the patient.    [place] Wei Poplaski LCSW-A   04/10/2024 10:48 AM

## 2024-04-10 NOTE — ED Notes (Signed)
 Pt refused vitals from Tech. Asking for meds

## 2024-04-10 NOTE — ED Notes (Signed)
 Called for transport and made them aware of cast on leg.  They will send someone when they have them free.

## 2024-04-10 NOTE — ED Notes (Signed)
 Tried to take pt's vitals. Pt refused stating that she does not want it done. Pt is also asking for medication.

## 2024-04-10 NOTE — ED Provider Notes (Signed)
 Emergency Medicine Observation Re-evaluation Note  Rebekah Davis is a 38 y.o. female, seen on rounds today.  Pt initially presented to the ED for complaints of Suicidal Currently, the patient is aggravated, she is in the hallway bed. Patient has been seen by psychiatry team, they recommend inpatient stay.  Physical Exam  BP 96/68 (BP Location: Left Arm)   Pulse 90   Temp 97.6 F (36.4 C)   Resp 19   Ht 5' 5 (1.651 m)   Wt (!) 147.9 kg   SpO2 96%   BMI 54.25 kg/m  Physical Exam General: No acute distress, but frustrated Cardiac: No respiratory distress and regular rate Lungs: No respiratory distress and regular rate Psych: Frustrated  ED Course / MDM  EKG:   I have reviewed the labs performed to date as well as medications administered while in observation.  Recent changes in the last 24 hours include -psychiatry team recommends inpatient admission.  Plan  Current plan is for patient to be involuntary committed.  I have tried to manage patient's expectation, she is understandably frustrated as she has been in the hallway bed all night.  I have discussed the case with charge nurse to see if they can give patient bed in TCU, however TCU is full.  Patient made aware that as soon as somebody is discharged, she will be placed in a bed.  However, nursing staff indicates that patient remains argumentative and has been threatening to leave.  Per psychiatry team, patient will need IVC if she tries to leave, we will proceed with IVC now.    Charlyn Sora, MD 04/10/24 1158
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
## 2024-04-10 NOTE — ED Notes (Signed)
 Called report to Esme, Charity fundraiser at [facility].  Pt can be sent over when we have transportation.

## 2024-04-10 NOTE — ED Notes (Signed)
 [facility] called and said they had to rescind the offer for a bed for this patient.  The patient they were discharging today will have to stay and they don't have an open bed right now.  They will keep her on their list for referral.

## 2024-04-10 NOTE — ED Notes (Signed)
 Pt only would take 5 mg of the abilify .  Only 5 mg given. Ok by NP

## 2024-04-10 NOTE — ED Notes (Signed)
 Per [facility], they are working on placement. Attempting [facility] again and to other places.

## 2024-04-10 NOTE — Consult Note (Signed)
  Patient seen face-to-face this morning. Guarded and irritable upon approach. Alert and oriented. Anxious and hopeless, reporting that I just want to die, would be better than this. Presented with recent cuts on left am. No sign of infection noted. Patient admits that she cut herself intentionally because I don't have a life, I am in pain, why should I live ?  Patient reports feeling upset because they messed up my medications, they want me to take 10 mg of Abilify  when it is supposed to be 5 mg. I also take Ativan  1 mg at bedtime, Vistaril  25 mg 3 times a day and as needed at bed time.  Patient reports I feel miserable here, I need to be in a quiet place, and be able to rest, and I was told that no one wants me.... Emotional support provided and safety precautions reinforced.  Patient is reassured that placement is in process.   Ordered EKG as part of admission requirements.   Medications reviewed and Abilify  modified to 5 mg PO every day per pt request. The other medication orders remain the same. We will continue to provide support and encouragements.

## 2024-04-10 NOTE — Progress Notes (Signed)
 Pt has been accepted to [facility] . Bed assignment:303-1  Pt meets inpatient criteria per Starlyn Patron, NP  Attending Physician will be Dr. Prentis   Report can be called to: [phone]  Pt can arrive after ASAP  Care Team Notified: [facility] Burnard Barter, RN, April Wilson, Paramedic

## 2024-04-11 DIAGNOSIS — F603 Borderline personality disorder: Secondary | ICD-10-CM | POA: Diagnosis not present

## 2024-04-11 DIAGNOSIS — F39 Unspecified mood [affective] disorder: Secondary | ICD-10-CM | POA: Diagnosis not present

## 2024-04-11 DIAGNOSIS — Y92019 Unspecified place in single-family (private) house as the place of occurrence of the external cause: Secondary | ICD-10-CM | POA: Diagnosis not present

## 2024-04-11 DIAGNOSIS — R45851 Suicidal ideations: Secondary | ICD-10-CM | POA: Diagnosis not present

## 2024-04-11 DIAGNOSIS — S50811A Abrasion of right forearm, initial encounter: Secondary | ICD-10-CM | POA: Diagnosis not present

## 2024-04-11 DIAGNOSIS — R4589 Other symptoms and signs involving emotional state: Secondary | ICD-10-CM | POA: Diagnosis not present

## 2024-04-11 DIAGNOSIS — S50812A Abrasion of left forearm, initial encounter: Secondary | ICD-10-CM | POA: Diagnosis not present

## 2024-04-11 DIAGNOSIS — M79662 Pain in left lower leg: Secondary | ICD-10-CM | POA: Diagnosis present

## 2024-04-11 DIAGNOSIS — S59911A Unspecified injury of right forearm, initial encounter: Secondary | ICD-10-CM | POA: Diagnosis present

## 2024-04-11 DIAGNOSIS — X58XXXA Exposure to other specified factors, initial encounter: Secondary | ICD-10-CM | POA: Diagnosis not present

## 2024-04-11 MED ORDER — METHOCARBAMOL 500 MG PO TABS
500.0000 mg | ORAL_TABLET | Freq: Three times a day (TID) | ORAL | Status: DC | PRN
  Administered 2024-04-14: 500 mg via ORAL
  Filled 2024-04-11 (×2): qty 1

## 2024-04-11 MED ORDER — NAPROXEN 500 MG PO TABS: 500.0000 mg | ORAL_TABLET | Freq: Two times a day (BID) | ORAL | Status: DC

## 2024-04-11 MED ORDER — NAPROXEN 500 MG PO TABS
500.0000 mg | ORAL_TABLET | Freq: Two times a day (BID) | ORAL | Status: DC
  Administered 2024-04-11 – 2024-04-15 (×9): 500 mg via ORAL
  Filled 2024-04-11 (×10): qty 1

## 2024-04-11 MED ORDER — OXYCODONE HCL 5 MG PO TABS
5.0000 mg | ORAL_TABLET | Freq: Once | ORAL | Status: AC
  Administered 2024-04-11: 5 mg via ORAL
  Filled 2024-04-11: qty 1

## 2024-04-11 MED ORDER — ACETAMINOPHEN 500 MG PO TABS
1000.0000 mg | ORAL_TABLET | Freq: Three times a day (TID) | ORAL | Status: DC
  Administered 2024-04-11 – 2024-04-15 (×12): 1000 mg via ORAL
  Filled 2024-04-11 (×14): qty 2

## 2024-04-11 MED ORDER — VITAMIN D (ERGOCALCIFEROL) 1.25 MG (50000 UNIT) PO CAPS: 50000.0000 [IU] | ORAL_CAPSULE | ORAL | Status: DC

## 2024-04-11 MED ORDER — ESCITALOPRAM OXALATE 10 MG PO TABS
10.0000 mg | ORAL_TABLET | Freq: Every day | ORAL | Status: DC
  Administered 2024-04-14: 10 mg via ORAL
  Filled 2024-04-11 (×3): qty 1

## 2024-04-11 MED ORDER — ASPIRIN 81 MG PO CHEW
81.0000 mg | CHEWABLE_TABLET | Freq: Every day | ORAL | Status: DC
  Administered 2024-04-11 – 2024-04-15 (×5): 81 mg via ORAL
  Filled 2024-04-11 (×6): qty 1

## 2024-04-11 NOTE — ED Notes (Signed)
 MD at bedside speaking with pt per pt request. Pt complaining of pain to left leg and calf. MD aware

## 2024-04-11 NOTE — ED Notes (Signed)
 Pt. Making comments,

## 2024-04-11 NOTE — ED Provider Notes (Signed)
 Emergency Medicine Observation Re-evaluation Note  Rebekah Davis is a 38 y.o. female, seen on rounds today.  Pt initially presented to the ED for complaints of Suicidal and IVC Currently, the patient is sitting in bed talking to the nurse.  Physical Exam  BP 116/79 (BP Location: Right Arm)   Pulse 95   Temp 97.8 F (36.6 C) (Oral)   Resp 18   SpO2 98%  Physical Exam General: No acute distress Cardiac: Normal rate Lungs: No increased work of breathing Psych: Calm  ED Course / MDM  EKG:   I have reviewed the labs performed to date as well as medications administered while in observation.  Recent changes in the last 24 hours include none.  Patient is concerned that her medications are not accurate.  Per the RN, patient has previously discussed this with pharmacy tech.  Will have pharmacy tech recheck her medication list.  Plan  Current plan is for psychiatric placement, awaiting acceptance.    Lenor Hollering, MD 04/11/24 [phone]

## 2024-04-11 NOTE — ED Notes (Signed)
 Pt. Stated she wanted to take a shower and get up and use the bathroom as she has not pooped in days. Pt. Pivot from bed to wheelchair w/ one assist. Pt. Pooped but then stated she was too tired to shower right now and would shower later.

## 2024-04-11 NOTE — ED Notes (Signed)
 Pt. Concerned about medications not being right. Pt. Wants to speak with someone concerning medications. Rounding MD made aware and referred pt. To speak with pharmacy about medication regimen.

## 2024-04-11 NOTE — ED Notes (Signed)
 Pt. Requested hydroxyzine  stating,  I'm about to loose my sh*t! Pt. Given medication and requested to speak w/ the provider concerning her stay. MD contacted and made aware. Pt. Stated she was not taking the medication until she has seen the provider.

## 2024-04-11 NOTE — ED Notes (Signed)
 This nurse went into check pt. Vitals. Pt. Refused for vitals to be taken stated her pain is up and vitals would be off.

## 2024-04-11 NOTE — ED Notes (Signed)
 Pt. Requesting pt. Zell of rights and wanting to speak to pt. Advocate. Pt. Given print out of bill of rights and pt. Advocate card.

## 2024-04-12 MED ORDER — OXYCODONE HCL 5 MG PO TABS
5.0000 mg | ORAL_TABLET | ORAL | Status: DC | PRN
  Administered 2024-04-12 – 2024-04-15 (×10): 5 mg via ORAL
  Filled 2024-04-12 (×11): qty 1

## 2024-04-12 MED ORDER — METRONIDAZOLE 0.75 % EX GEL
Freq: Two times a day (BID) | CUTANEOUS | Status: DC
  Filled 2024-04-12 (×4): qty 45

## 2024-04-12 MED ORDER — BACITRACIN ZINC 500 UNIT/GM EX OINT
TOPICAL_OINTMENT | CUTANEOUS | Status: DC | PRN
  Filled 2024-04-12: qty 3.6

## 2024-04-12 MED ORDER — LORAZEPAM 1 MG PO TABS
1.0000 mg | ORAL_TABLET | Freq: Once | ORAL | Status: AC
  Administered 2024-04-12: 1 mg via ORAL
  Filled 2024-04-12: qty 1

## 2024-04-12 NOTE — ED Notes (Signed)
 Pt was found using a broken up plastic spoon to scratch arms more.  Spoons were removed from room.

## 2024-04-12 NOTE — Progress Notes (Signed)
 Chaplain rec'd page from clinical staff noting patient is IVC with SI.  Chaplain met with pt in her room. She was receptive to chaplain's visit. Pt states that she requested chaplain visit on 6/28 but does not recall any chaplain visit.  Pt identifies as a Curator. She states that she only listens to [place] music and primarily reads the Bible. Her faith is of central importance to her, and she employs her belief in God as a key coping strategy.   Pt does not have any social supports at the moment. Her father is living, but she says that he has dementia and is not a good influence. She noted five social acquaintances/friends, but she says that these people are in no position to assist her as they are themselves dealing with personal struggles. Pt states that she hasn't had anyone to talk to since February.  Pt recounted hx and reason for admission. According to her, she has been attempting to resolve ongoing homelessness and joblessness, but feels that everyone she contacts is unhelpful and dismissive of her. She is also facing health issues which currently makes mobility difficult.  Pt is experiencing severe spiritual distress. She says I've run out of hope.   Chaplain provided pt with a compassionate, reflective space within which to express her feelings. Chaplain normalized her emotions and aided pt in employing her faith as a means to cope. Chaplain recommends that pt receive regular chaplain visits during her admission.  Time with patient: 70 minutes

## 2024-04-12 NOTE — ED Provider Notes (Signed)
 Patient complaining of facial rash which has been present since before presentation to the ER.  Rash appears to be consistent with papular acne or rosacea.  Will trial metronidazole  gel and evaluate for improvement.   Albertina Dixon, MD 04/12/24 2110

## 2024-04-12 NOTE — ED Notes (Signed)
 Patient awake and assisted to the bathroom by wheelchair

## 2024-04-12 NOTE — ED Provider Notes (Addendum)
 Emergency Medicine Observation Re-evaluation Note  Rebekah Davis is a 38 y.o. female, seen on rounds today.  Pt initially presented to the ED for complaints of Suicidal and IVC Currently, the patient is resting in room without complaint.  Physical Exam  BP (!) 154/93 (BP Location: Right Arm)   Pulse 92   Temp 98.5 F (36.9 C) (Oral)   Resp 20   SpO2 99%  Physical Exam Vitals reviewed.  Constitutional:      Appearance: Normal appearance.   Cardiovascular:     Rate and Rhythm: Normal rate.  Pulmonary:     Effort: Pulmonary effort is normal.  Abdominal:     General: There is no distension.   Neurological:     General: No focal deficit present.     Mental Status: She is alert.   Psychiatric:        Mood and Affect: Mood normal.     ED Course / MDM  EKG:   I have reviewed the labs performed to date as well as medications administered while in observation.  Recent changes in the last 24 hours include no changes.  Patient was complaining of some foot pain.  Went over to evaluate the patient.  She has a cast on the left foot for an Achilles injury.  She has soft compartments above the injury, no swelling or erythema is visible.  Able to wiggle toes.  Do not believe the patient should have the cast removed.  Will continue to treat her with medications.  Plan  Current plan is for placement.    Mannie Pac T, DO 04/12/24 0734    Mannie Pac T, DO 04/12/24 1217

## 2024-04-12 NOTE — ED Notes (Signed)
 Patient sleeping comfortably at this time, sitter at bedside

## 2024-04-12 NOTE — ED Notes (Addendum)
 Pt did not want to take escitalopram  because she heard one of the side effects were increased eye pressure. Pt informed RN that she will not take it until she speaks to a provider.

## 2024-04-12 NOTE — ED Notes (Signed)
 Pt informed RN that her face was breaking out more than usual, MD notified.

## 2024-04-12 NOTE — Progress Notes (Signed)
 Patient has been denied by [facility] and has been faxed out due to no appropriate beds available. Patient meets [facility] inpatient criteria per Starlyn Patron, NP. Patient has been faxed out to the following facilities:   [facility]  [address], [place] [address] [phone]  [facility]  [address] [place] [address] [phone]  [facility]  [address] [place], [place] [address] [phone]  [facility]  [address] [place], [place] [address] [phone]  [facility]  [address] [place] [address] [phone]  [facility]  [address] [place] [address] [phone]  [facility]  [address], [place] [address] [phone]  [facility]  [address], [place] [address] [phone]  [facility]  [address] [place] [phone]  [facility]  [address] [place], [place] [address] [phone]  [facility]  [place] [phone]  [facility]  [address] [place], [place] [address] [phone]    Bunnie Gallop, MSW, LCSW-A  10:57 AM 04/12/2024

## 2024-04-12 NOTE — ED Notes (Signed)
 Patient's dressing came off. I redressed her left arm.

## 2024-04-13 DIAGNOSIS — R4589 Other symptoms and signs involving emotional state: Secondary | ICD-10-CM | POA: Diagnosis not present

## 2024-04-13 MED ORDER — LORAZEPAM 1 MG PO TABS
1.0000 mg | ORAL_TABLET | Freq: Every day | ORAL | Status: DC
  Administered 2024-04-13 – 2024-04-14 (×2): 1 mg via ORAL
  Filled 2024-04-13 (×2): qty 1

## 2024-04-13 MED ORDER — BENZONATATE 100 MG PO CAPS
200.0000 mg | ORAL_CAPSULE | Freq: Three times a day (TID) | ORAL | Status: DC | PRN
  Administered 2024-04-13 – 2024-04-14 (×4): 200 mg via ORAL
  Filled 2024-04-13 (×4): qty 2

## 2024-04-13 MED ORDER — BENZONATATE 100 MG PO CAPS
100.0000 mg | ORAL_CAPSULE | Freq: Once | ORAL | Status: AC
  Administered 2024-04-13: 100 mg via ORAL
  Filled 2024-04-13: qty 1

## 2024-04-13 NOTE — ED Notes (Signed)
Pt sleeping comfortably, Sitter at bedside

## 2024-04-13 NOTE — ED Notes (Signed)
Patient has been calm and cooperative.

## 2024-04-13 NOTE — Consult Note (Signed)
 [facility] Consult Follow-up  Patient Name: .Rebekah Davis  MRN: [id number]  DOB: 1985/12/06  Consult Order details:  Orders (From admission, onward)     Start     Ordered   04/13/24 1628  CONSULT TO CALL ACT TEAM       Ordering Provider: Dasie Faden, MD  Provider:  (Not yet assigned)  Question:  Reason for Consult?  Answer:  IVC SI   04/13/24 1627             Mode of Visit: In person    Psychiatry Consult Evaluation  Service Date: April 13, 2024 LOS:  LOS: 0 days  Chief Complaint I want to get out of here  Primary Psychiatric Diagnoses  Mood Disorder (Hcc) 2.  Borderline Personality Disorder  3.  Deliberate self-cutting  Assessment  Rebekah Davis is a 38 y.o. female admitted: Presented to the ED  04/10/2024  4:35 PM.  She carries the psychiatric diagnoses of depression, anxiety and has a past medical history of concussion, chronic back pain, lymphadenopathy.  Patient initially presented to the ED due to SI.  She was recommended for inpatient psychiatric admission and was accepted to [facility].  However once patient arrived to [facility] it is charted that patient stated she would not be able to complete her ADLs and will not do anything for herself.  In addition she had a knife hidden in the cast which she used to scratch up her left forearm on her car on the way to [facility]  She was subsequently sent back to [facility] ED.  She continued to be recommended for inpatient admission and social work has actively been seeking psychiatric placement.  However patient has a cast on her left lower leg and is using a wheelchair.  She has required some assistance while she has boarded in the emergency department with ADLs.  This continues to be a barrier for placement.  She continues to meet criteria for inpatient psychiatric admission.  Patient has continued to self-harm while she has remained in the facility.  Patient also denies having any protective factors or support once  discharged.  She admits that she has lost her job and will be evicted soon due to financial distress.  Reports her Medicaid is about to run out and she has no idea how she is going to pay for the hospital stay.  Her risk factors for completed suicide are high.  She currently had not been taking any psychiatric medications and states she has not done so since this past January.  She has no psychiatric services in place currently.    On initial examination, patient is observed laying in her bed.  She is disheveled and irritable upon approach.  She is frustrated that she has not been seen by psychiatry in the past 2 days.  Explained to patient that she has congenitally been faxed out to psychiatric facilities to seek psychiatric bed placement.  She also reports her dissatisfaction with being in a room with no windows, no TV, and no music.  She denies accusations that she has hidden objects in her cast.  She openly admits that she has self-harm/cut her arms and reports she does so because, I can control that pain.  She is adamant that she is currently not suicidal and has no plan to commit suicide.  She denies any access to means such as firearms/weapons.  She denies any previous suicide attempts.  She is verbally contracting for safety at this  time.  However, she cannot identify any protective factors.  She reports no support.  There is no one that she can call when she is discharged.  There is no one that can be called for collateral.  She has a few friends but all are dealing with life stressors or out of town.  She is currently denying any depression and states all of her problems are situational.  Due to her recent unfortunate events.  In addition to being fired from her job.  She fell at her previous job and broke her left foot.  Per patient she has been diagnosed with a seizure from a neurologist and now her license have been taken away and she is unable to get to a job even if she were to find one..  She  is at risk for homelessness due to not being able to pay her apartment rent.  She does endorse some anxiety.  She denies any paranoia or delusional thought content.  She denies any concerns with appetite or sleep.  Patient reports that she is able to pivot from wheelchair to bed. She reports showering independently today after staff placed toiletries and an towel in the bathroom. She does have difficulty changing clothes.   Please see plan below for detailed recommendations.   Diagnoses:  Active Hospital problems: Active Problems:   * No active hospital problems. *    Plan   ## Psychiatric Medication Recommendations:   Continue:  Abilify  5 mg daily for mood     Atarax  25 mg p.o. 3 times daily as needed for anxiety    Lexapro  10 mg daily for depression  ## Medical Decision Making Capacity: Not specifically addressed in this encounter  ## Further Work-up:  -- no further recommendations   -- most recent EKG on 04/10/2024  had QtC of 452 -- Pertinent labwork reviewed earlier this admission includes: CBC, CMP, EKG, UDS    ## Disposition:-- We recommend inpatient psychiatric hospitalization after medical hospitalization. Patient has been involuntarily committed on 04/10/2024.   ## Behavioral / Environmental: -To minimize splitting of staff, assign one staff person to communicate all information from the team when feasible. Utilize compassion and acknowledge the patient's experiences while setting clear and realistic expectations for care.     ## Safety and Observation Level:  - Based on my clinical evaluation, I estimate the patient to be at moderate risk of self harm in the current setting. - At this time, we recommend  1:1 Observation. This decision is based on my review of the chart including patient's history and current presentation, interview of the patient, mental status examination, and consideration of suicide risk including evaluating suicidal ideation, plan, intent, suicidal or  self-harm behaviors, risk factors, and protective factors. This judgment is based on our ability to directly address suicide risk, implement suicide prevention strategies, and develop a safety plan while the patient is in the clinical setting. Please contact our team if there is a concern that risk level has changed.  CSSR Risk Category:C-SSRS RISK CATEGORY: High Risk  Suicide Risk Assessment: Patient has following modifiable risk factors for suicide: untreated depression, social isolation, medication noncompliance, and triggering events, which we are addressing by medication management and recommending IP admission.. Patient has following non-modifiable or demographic risk factors for suicide: history of suicide attempt, history of self harm behavior, and psychiatric hospitalization Patient has the following protective factors against suicide: Access to outpatient mental health care  Thank you for this consult request. Recommendations have been  communicated to the primary team.  We will recommend IP admission  at this time. Psychiatry will continue to follow.  Elveria VEAR Batter, NP       History of Present Illness  Relevant Aspects of Hospital ED Course:  Originaly Admitted on 04/09/2024 for suicidal thoughts for a month with a plan to cut herself until she can't feel anything. Noted for have self-inflicted cuts from razor blade  Patient then sent to [facility] but once patient arrived to [facility] it is charted that patient stated she would not be able to complete her ADLs and will not do anything for herself.  In addition she had a 4 kitten in the cast which she used to scratch up her left forearm on her car on the way to [facility].  She was subsequently sent back to [facility] ED  Patient Report:  I want to get out of here  Psych ROS:  Depression: denies  Anxiety:  endorses  Mania (lifetime and current): denies Psychosis: (lifetime and current): denies  Collateral information: Contacted none,  patient did not give permission      Review of Systems  Constitutional:  Negative for chills and fever.  HENT:  Negative for hearing loss.   Respiratory:  Positive for cough.   Cardiovascular:  Negative for chest pain.  Musculoskeletal:        Left leg in cast uses wheelchair   Neurological:  Negative for tremors.  Psychiatric/Behavioral:  The patient is nervous/anxious.      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 Current Psych Provider: yes Home Meds (current): lexapro  abilify  Previous Med Trials: unknown Therapy: denies   Prior Psych Hospitalization: yes, march 2025  Prior Self Harm: yes Prior Violence: denies  Access to weapons/lethal means: denies    Substance History Alcohol: denies  Illicit drugs: denies  Exam Findings  Physical Exam:  Vital Signs:  Temp:  [97.7 F (36.5 C)] 97.7 F (36.5 C) (06/30 0702) Pulse Rate:  [86] 86 (06/30 0702) Resp:  [20] 20 (06/30 0702) BP: (140)/(76) 140/76 (06/30 0702) SpO2:  [96 %] 96 % (06/30 0702) Blood pressure (!) 140/76, pulse 86, temperature 97.7 F (36.5 C), temperature source Oral, resp. rate 20, SpO2 96%. There is no height or weight on file to calculate BMI.  Physical Exam Vitals and nursing note reviewed. Exam conducted    Neurological:     Mental Status: She is alert.    Psychiatric:        Attention and Perception: Attention normal.        Mood and Affect: Mood is frustrated Affect is anxious/irratable         Speech: Speech normal.        Behavior: Behavior is cooperative.        Thought Content: Denies SI    Cognition and Memory: Memory normal. Mental Status Exam: General Appearance: Disheveled  Orientation:  Full (Time, Place, and Person)  Memory:  Immediate;   Good Recent;   Good Remote;   Good  Concentration:  Concentration: Good  Recall:   Good  Attention  Good  Eye Contact:  Good  Speech:  Clear and Coherent and Normal Rate  Language:  Good  Volume:  Normal  Mood: frustrated  Affect:  irritable/anxous  Thought Process:  Coherent  Thought Content:  Logical  Suicidal Thoughts:  No  Homicidal Thoughts:  No  Judgement:  Fair  Insight:  Fair  Psychomotor Activity:  slow,  gait unsteady  Akathisia:  not assessed   Fund of Knowledge:  Good      Assets:  Communication Skills Desire for Improvement  Cognition:  WNL  ADL's:  Impaired  AIMS (if indicated):        Other History   These have been pulled in through the EMR, reviewed, and updated if appropriate.  Family History:  The patient's family history includes Depression in her mother; Diabetes in her father and mother; Heart disease in her paternal grandmother; Hypertension in her father and mother; Kidney disease in her mother; Other in her maternal grandmother; Ovarian cancer in her maternal grandmother; Stroke in her paternal grandfather; Thyroid  disease in her mother.  Medical History: Past Medical History:  Diagnosis Date  . Abdominal pain, suprapubic 06/04/2016  . Abnormal Pap smear of cervix   . Acute cystitis 03/10/2018  . Anxiety   . Arthritis   . C. difficile diarrhea 06/12/2018  . Cataract   . Chest pain 03/28/2018  . Depression   . Depression with suicidal ideation 03/09/2018  . Diarrhea 06/13/2018  . GERD (gastroesophageal reflux disease)   . History of kidney stones   . Hypomagnesemia 06/13/2018  . Kidney stone   . Low back pain 07/22/2012  . Migraine   . Non-suicidal self harm as coping mechanism (HCC) 12/25/2019  . Oral thrush 06/12/2018  . Ovarian cyst   . PCOS (polycystic ovarian syndrome)   . PTSD (post-traumatic stress disorder)   . Scarlet fever   . Sleep apnea   . Substance abuse (HCC)   . Suicidal ideation 04/12/2018    Surgical History: Past Surgical History:  Procedure Laterality Date  . BILATERAL SALPINGOECTOMY    .  LUMBAR LAMINECTOMY    . RIGHT OOPHERECTOMY Right   . TENOLYSIS Left 10/04/2020   Procedure: LEFT ACHILLES TENDON DEBRIDEMENT AND RECONSTRUCTION, CALCANEAL EXOSTECTOMY, GASTROCNEMIUS RECESSION,  FLEXOR HALLUCIS LONGUS TRANSFER;  Surgeon: Elsa Lonni SAUNDERS, MD;  Location: [facility] OR;  Service: Orthopedics;  Laterality: Left;  LENGTH OF SURGERY: 1.5 HOURS  . TONSILLECTOMY       Medications:   Current Facility-Administered Medications:  .  acetaminophen  (TYLENOL ) tablet 1,000 mg, 1,000 mg, Oral, Q8H, Kommor, Madison, MD, 1,000 mg at 04/13/24 1323 .  alum & mag hydroxide-simeth (MAALOX/MYLANTA) 200-200-20 MG/5ML suspension 30 mL, 30 mL, Oral, Q6H PRN, Long, Joshua G, MD .  ARIPiprazole  (ABILIFY ) tablet 5 mg, 5 mg, Oral, Daily, Long, Joshua G, MD, 5 mg at 04/13/24 1021 .  aspirin  chewable tablet 81 mg, 81 mg, Oral, Daily, Kommor, Madison, MD, 81 mg at 04/13/24 1022 .  bacitracin  ointment, , Topical, PRN, Kommor, Madison, MD, Given at 04/12/24 2304 .  escitalopram  (LEXAPRO ) tablet 10 mg, 10 mg, Oral, QHS, Byungura, Veronique M, NP .  hydrOXYzine  (ATARAX ) tablet 25 mg, 25 mg, Oral, Q8H PRN, Long, Joshua G, MD, 25 mg at 04/13/24 1331 .  methocarbamol  (ROBAXIN ) tablet 500 mg, 500 mg, Oral, Q8H PRN, Lenor Hollering, MD .  metroNIDAZOLE  (METROGEL ) 0.75 % gel, , Topical, BID, Kommor, Madison, MD .  naproxen (NAPROSYN) tablet 500 mg, 500 mg, Oral, BID WC, Kommor, Madison, MD, 500 mg at 04/13/24 1617 .  ondansetron  (ZOFRAN ) tablet 4 mg, 4 mg, Oral, Q8H PRN, Long, Joshua G, MD .  oxyCODONE  (Oxy IR/ROXICODONE ) immediate release tablet 5 mg, 5 mg, Oral, Q4H PRN, Kommor, Madison, MD, 5 mg at 04/13/24 1617 .  senna-docusate (Senokot-S)  tablet 1 tablet, 1 tablet, Oral, QHS PRN, Long, Joshua G, MD, 1 tablet at 04/11/24 2114 .  [START ON 04/16/2024] Vitamin D  (Ergocalciferol ) (DRISDOL ) 1.25 MG (50000 UNIT) capsule 50,000 Units, 50,000 Units, Oral, Q7 days, Byungura, Veronique M, NP  Current Outpatient Medications:  .   acetaminophen  (TYLENOL ) 500 MG tablet, Take 1,000 mg by mouth 2 (two) times daily., Disp: , Rfl:  .  albuterol  (VENTOLIN  HFA) 108 (90 Base) MCG/ACT inhaler, Inhale 1 puff into the lungs every 4 (four) hours as needed. (Patient taking differently: Inhale 1 puff into the lungs every 4 (four) hours as needed for shortness of breath or wheezing (seek medical care if SVT(s) result).), Disp: 18 g, Rfl: 3 .  ARIPiprazole  (ABILIFY ) 5 MG tablet, Take 1 tablet (5 mg total) by mouth at bedtime for mood. (Patient taking differently: Take 5 mg by mouth every morning.), Disp: 14 tablet, Rfl: 0 .  cyclobenzaprine  (FLEXERIL ) 5 MG tablet, Take 1 tablet (5 mg total) by mouth at bedtime as needed. (Patient not taking: Reported on 04/09/2024), Disp: 14 tablet, Rfl: 0 .  ergocalciferol  (VITAMIN D2) 1.25 MG (50000 UT) capsule, Take 1 capsule (50,000 Units total) by mouth once a week. (Patient taking differently: Take 50,000 Units by mouth every Friday.), Disp: 4 capsule, Rfl: 2 .  hydrOXYzine  (ATARAX ) 25 MG tablet, Take 1 tablet (25 mg total) by mouth 3 (three) times daily as needed for anxiety. (Patient taking differently: Take 25 mg by mouth See admin instructions. Take 25 mg by mouth three times a day AND at bedtime as needed for anxiety or sleep), Disp: 30 tablet, Rfl: 0 .  ibuprofen  (ADVIL ) 800 MG tablet, Take 800 mg by mouth 3 (three) times daily., Disp: , Rfl:  .  lamoTRIgine  (LAMICTAL ) 100 MG tablet, Take 1 tablet (100 mg total) by mouth 2 (two) times daily. (Patient not taking: Reported on 04/09/2024), Disp: 60 tablet, Rfl: 11 .  levonorgestrel (MIRENA, 52 MG,) 20 MCG/DAY IUD, 1 each by Intrauterine route once., Disp: , Rfl:  .  LORazepam  (ATIVAN ) 0.5 MG tablet, Take 1-2 tablets (0.5-1 mg total) by mouth every 4-6 hours as needed for severe panic attacks. (Patient not taking: Reported on 04/09/2024), Disp: 10 tablet, Rfl: 2 .  LORazepam  (ATIVAN ) 1 MG tablet, Take 1 mg by mouth at bedtime., Disp: , Rfl:  .  methocarbamol   (ROBAXIN ) 500 MG tablet, Take 500 mg by mouth every 8 (eight) hours as needed for muscle spasms., Disp: , Rfl:  .  ondansetron  (ZOFRAN ) 4 MG tablet, Take 4 mg by mouth every 8 (eight) hours as needed for nausea or vomiting., Disp: , Rfl:  .  phenazopyridine  (PYRIDIUM ) 100 MG tablet, Take 1 tablet (100 mg total) by mouth 2 (two) times daily for Dysuria. (Patient not taking: Reported on 04/09/2024), Disp: 28 tablet, Rfl: 0 .  polyethylene glycol powder (GLYCOLAX /MIRALAX ) 17 GM/SCOOP powder, Take 17 g by mouth daily as needed for mild constipation or moderate constipation., Disp: , Rfl:  .  sennosides-docusate sodium (SENOKOT-S) 8.6-50 MG tablet, Take 1 tablet by mouth daily., Disp: , Rfl:  .  tamsulosin  (FLOMAX ) 0.4 MG CAPS capsule, Take 1 capsule (0.4 mg total) by mouth daily. (Patient not taking: Reported on 04/09/2024), Disp: 30 capsule, Rfl: 0  Allergies: Allergies  Allergen Reactions  . Adhesive [Tape] Other (See Comments)    blister  . Cherry Anaphylaxis and Rash  . Sulfamethoxazole-Trimethoprim Hives  . Levofloxacin Other (See Comments)    Muscle Pain  . Morphine Other (  See Comments)    Feels like unable to breath or swallow   . Tizanidine Diarrhea  . Gabapentin Rash  . Metformin Nausea And Vomiting and Other (See Comments)    Diaphoresis   . Sulfa Antibiotics Hives and Rash  . Sumatriptan Diarrhea, Nausea And Vomiting and Other (See Comments)    Decreased heart rate and respiratory rate      Elveria VEAR Batter, NP

## 2024-04-13 NOTE — ED Notes (Addendum)
 I entered the patient's room with her morning medications. The medications were scanned and opened by me prior to entering her room. She demanded that I bring her the casings that the medications were packaged in so she could verify she was getting what I told her. When I brought the packaging into her room, she then stated, I'm not taking those because I didn't see you open them. She could not be convinced otherwise. The medications were then wasted.

## 2024-04-13 NOTE — ED Provider Notes (Signed)
 Emergency Medicine Observation Re-evaluation Note  Rebekah Davis is a 38 y.o. female, seen on rounds today.  Pt initially presented to the ED for complaints of Suicidal and IVC Currently, the patient is laying in bed.  Physical Exam  BP (!) 140/76 (BP Location: Right Arm)   Pulse 86   Temp 97.7 F (36.5 C) (Oral)   Resp 20   SpO2 96%  Physical Exam General: No acute distress Cardiac: Normal rate Lungs: No increased work of breathing Psych: Calm  ED Course / MDM  EKG:   I have reviewed the labs performed to date as well as medications administered while in observation.  Recent changes in the last 24 hours include none.  Plan  Current plan is for inpatient psychiatric treatment.  Awaiting placement.    Lenor Hollering, MD 04/13/24 [phone]

## 2024-04-13 NOTE — ED Notes (Signed)
 I entered the patient's room to give her morning medications and found that she had broken the plastic fork that came with her breakfast tray. I witnessed the patient intentionally using the fork to scratch her leg in what I believe was an attempt to harm herself. The fork was taken from her and she was informed she would no longer get utensils with her meals

## 2024-04-13 NOTE — ED Notes (Signed)
 The patient initially refused her morning medications, as previously documented. She requested to speak with the charge nurse, P. Sherlynn, Charity fundraiser. After speaking with the patient, RN Sherlynn asked me to re pull her medications and administer them to her. Meds were opened in front of the patient and she swallowed same

## 2024-04-13 NOTE — ED Notes (Addendum)
 Pt requested to see CN, upon arrived pt concerned cast is too tight on left leg, reported toes are numb, upon assessment toes cool to touch, appear to be slightly swollen, darker in color compared to rt toes, she can wiggle them a little. Writer cannot assess cap refills due to green polish on toenails. Also there is a concern that no one has seen her from SW or Psych in a few days. Upon checking chart, this proves true. TTS re ordered as Psych did not have her on the list, SW contacted, since we do not have someone at the moment in house, she should be seen in the AM. Will update pt on plan.

## 2024-04-13 NOTE — Evaluation (Addendum)
 Physical Therapy Evaluation Patient Details Name: Rebekah Davis MRN: [id number] DOB: [birth year] Today's Date: 04/13/2024  History of Present Illness  Pt is a 38 yo  presents to Ed 04/10/24 with suicidal thoughts. 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  Clinical Impression  Pt admitted with above diagnosis.  Pt currently with functional limitations due to the deficits listed below (see PT Problem List). Pt will benefit from acute skilled PT to increase their independence and safety with mobility to allow discharge.     The patient is emotional related to her current situation, being NWB on the LLE due to rerupture of left Achilles tendon, is currently seated in a transport chair that she cannot mobilize in.  Patient provided a regular wide Wc,  patient demonstrated transfers  form Wc to [facility], to bed and propelled in room and into BR with supervision, no assistance. Patient provided L elevating leg rest to use as needed for support. PT will return 7/1 and assess patient on a right platform RW(Limited WB through left hand) and NWB on LLE.       If plan is discharge home, recommend the following: A little help with walking and/or transfers;Assist for transportation;Assistance with cooking/housework;Help with stairs or ramp for entrance   Can travel by private vehicle        Equipment Recommendations Rolling walker (2 wheels);Wheelchair (measurements PT);Wheelchair cushion (measurements PT) (right platform)  Recommendations for Other Services       Functional Status Assessment Patient has had a recent decline in their functional status and demonstrates the ability to make significant improvements in function in a reasonable and predictable amount of time.     Precautions / Restrictions Precautions Precautions: Fall Recall of Precautions/Restrictions: Intact Precaution/Restrictions Comments: limited WB through the  left  hand, needs surgery Required Braces or Orthoses: Splint/Cast Splint/Cast: SLC left Restrictions Weight Bearing Restrictions Per Provider Order: Yes LLE Weight Bearing Per Provider Order: Non weight bearing Other Position/Activity Restrictions: per pt.      Mobility  Bed Mobility Overal bed mobility: Modified Independent                  Transfers Overall transfer level: Needs assistance Equipment used: None Transfers: Sit to/from Stand, Bed to chair/wheelchair/BSC Sit to Stand: Supervision Stand pivot transfers: Supervision         General transfer comment: transferred from [facility] to [facility] , WC<>BSC over toilet, WC<>bed.    Ambulation/Gait                  Administrator mobility: Yes Wheelchair propulsion: Left upper extremity  Propelled around room with right hand and right foot mostly  Tilt Bed    Modified Rankin (Stroke Patients Only)       Balance Overall balance assessment: Mild deficits observed, not formally tested                                           Pertinent Vitals/Pain Pain Assessment Pain Assessment: Faces Faces Pain Scale: Hurts whole lot Pain Location: dorsal cast Pain Descriptors / Indicators: Aching, Tightness Pain Intervention(s): Monitored during session    Home Living Family/patient expects to be discharged to:: Private residence Living Arrangements: Alone Available Help at Discharge: Available PRN/intermittently Type of Home: Apartment Home  Access: Level entry (1 curb,, Handicap ramp at curb but is far from apt)         Home Equipment: None      Prior Function Prior Level of Function : Independent/Modified Independent             Mobility Comments: until casted and NWB ADLs Comments: Indep     Extremity/Trunk Assessment   Upper Extremity Assessment Upper Extremity Assessment: Right hand dominant;RUE deficits/detail RUE  Deficits / Details: has finger 3 and 4 taped, states needs surgery, limited WB on the hand    Lower Extremity Assessment Lower Extremity Assessment: LLE deficits/detail LLE Deficits / Details: in [facility]    Cervical / Trunk Assessment Cervical / Trunk Assessment: Normal  Communication   Communication Communication: No apparent difficulties    Cognition Arousal: Alert Behavior During Therapy: Lability   PT - Cognitive impairments: No apparent impairments                         Following commands: Intact       Cueing       General Comments      Exercises     Assessment/Plan    PT Assessment Patient needs continued PT services  PT Problem List Decreased strength;Decreased mobility;Decreased safety awareness;Decreased range of motion;Decreased activity tolerance;Pain;Decreased knowledge of use of DME       PT Treatment Interventions DME instruction;Therapeutic activities;Gait training;Therapeutic exercise;Patient/family education;Functional mobility training;Wheelchair mobility training    PT Goals (Current goals can be found in the Care Plan section)  Acute Rehab PT Goals Patient Stated Goal: to get around PT Goal Formulation: With patient Time For Goal Achievement: 04/27/24 Potential to Achieve Goals: Good    Frequency Min 2X/week     Co-evaluation               AM-PAC PT 6 Clicks Mobility  Outcome Measure Help needed turning from your back to your side while in a flat bed without using bedrails?: None Help needed moving from lying on your back to sitting on the side of a flat bed without using bedrails?: None Help needed moving to and from a bed to a chair (including a wheelchair)?: None Help needed standing up from a chair using your arms (e.g., wheelchair or bedside chair)?: A Little Help needed to walk in hospital room?: Total Help needed climbing 3-5 steps with a railing? : Total 6 Click Score: 17    End of Session   Activity  Tolerance: Patient tolerated treatment well Patient left: in bed;with call bell/phone within reach Nurse Communication: Mobility status PT Visit Diagnosis: Unsteadiness on feet (R26.81);Difficulty in walking, not elsewhere classified (R26.2)    Time: 1430-1510 PT Time Calculation (min) (ACUTE ONLY): 40 min   Charges:   PT Evaluation $PT Eval Low Complexity: 1 Low PT Treatments $Therapeutic Activity: 23-37 mins PT General Charges $$ ACUTE PT VISIT: 1 Visit         Darice Potters PT Acute Rehabilitation Services Office [phone]   Potters Darice Norris 04/13/2024, 4:03 PM

## 2024-04-13 NOTE — ED Provider Notes (Signed)
 I was asked to assess the patient after she was complaining of tingling in her toes where she has a cast placed for Achilles tendon rupture.  Initially plan was to remove the cast.  When our [facility] went to remove the cast the patient was refusing.  I did assess the patient.  She states that she took some naproxen and has elevated her legs and that her symptoms have resolved now.  She reports normal sensation and denies any numbness or tingling.  She is able to move her toes without any difficulty and denies any significant pain.  Given the improvement in her symptoms with elevation and anti-inflammatories we will continue to monitor.  I did discuss with the patient that the only way to check compartments would be to remove the cast but since her symptoms have resolved now suspicion for this is relatively low.  Will continue to monitor.   Ula Prentice SAUNDERS, MD 04/13/24 [phone]

## 2024-04-13 NOTE — Progress Notes (Signed)
 Inpatient Psychiatric Referral  Patient was recommended inpatient per Elveria Batter, NP. There are no available beds at [facility], per [facility] Geni Silvan, RN . Patient was referred to the following out of network facilities:  Destination  Service Provider Request Status Address Phone Fax  [facility]  Pending - Request Sent [address] [place] [address] [phone]  [facility]  Pending - Request Sent [address], [place] [address] [phone]  [facility]  Pending - Request Sent [address] [place], [place] [address] [phone]  [facility]  Pending - Request Sent [address] [place] [address] [phone]  [facility]  Pending - Request Sent [address] [place] [address] [phone]  [facility]  Pending - Request Sent [address], [place] [address] [phone]  [facility]  Pending - Request Sent [address] [place] [address] [phone]  [facility]  Pending - Request Sent [address] [place] [address] [phone]  [facility]  Pending - Request Sent [address] [place] [phone]  [facility]  Pending - Request Sent [address] [place] [address] [phone]  [facility]  Pending - Request Sent [place] [phone]  [facility]  Pending - No Request Sent [address] [place], [place] [address] [phone]    Situation ongoing, CSW to continue following and update chart as more information becomes available.   Harrie Sofia MSW, LCSWA 04/13/2024  11:21PM

## 2024-04-13 NOTE — ED Notes (Signed)
 Pt requested to speak with CN, several concerns were addressed: meds, walker, toilet paper, need for assistance for personal care, and pain meds as requested every 4 hours. At this time each has been addressed and Francis Paramedic has be made aware. PT is to come assist with walker.

## 2024-04-13 NOTE — Progress Notes (Signed)
 Orthopedic Tech Progress Note Patient Details:  Rebekah Davis [birth year] [id number]  Patient ID: Rebekah Davis, female   DOB: [birth year], 38 y.o.   MRN: [id number] Patient refused cast removal, MD notified and spoke with patient. At this time MD is fine with cast being left on. Laymon DELENA Munroe 04/13/2024, 5:27 PM

## 2024-04-14 NOTE — Progress Notes (Signed)
 Rebekah Davis requested to meet with chaplain.  I provided listening as she shared some frustrations over not knowing what her plan of care is.  She is feeling very stuck by being in the hospital but also stuck in life and without the resources she needs.  She lost her job in February and lost medicaid yesterday and she doesn't know what to do about this.  I provided emotional support and spiritual support through creating a safe and compassionate space for her to share.  We will continue to follow, but please page if needs arise.

## 2024-04-14 NOTE — ED Notes (Signed)
Chaplin at bedside with patient.

## 2024-04-14 NOTE — ED Notes (Signed)
 PT left walker in room states patient needs x1 person assist when ambulating.

## 2024-04-14 NOTE — ED Notes (Signed)
 Patient asked RN to contact chaplin, RN called/paged Chaplin for patient

## 2024-04-14 NOTE — ED Notes (Addendum)
 Currently ambulating with mobile specialist

## 2024-04-14 NOTE — ED Provider Notes (Signed)
 This provider spoke with patient about her disposition, informed her that she has continued to be faxed out, when this provider asked patient if she continued to feel she wanted to harm herself she states yes now, more than ever, you does have me trapped in this room, and I cannot go anywhere. Engaged in therapeutic discussion regarding safety and need for higher level of care if suicidal thoughts persist or escalate. Emphasized that if the patient continues to express or entertain thoughts of self-harm, they will not be permitted to return home due to safety risks, and closer monitoring will be necessary to ensure their well-being Spoke with patient about medication changes,  about adding on an antidepressant, patient states that she is taking many antidepressants and has a hard time with antidepressant due to increased pressure.  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

## 2024-04-14 NOTE — Progress Notes (Signed)
 Mobility Specialist - Progress Note   04/14/24 1510  Mobility  Activity Ambulated with assistance in hallway  Level of Assistance Standby assist, set-up cues, supervision of patient - no hands on  Assistive Device Front wheel walker Investment banker, operational)  Distance Ambulated (ft) 40 ft  LLE Weight Bearing Per Provider Order NWB  Activity Response Tolerated well  Mobility Referral Yes  Mobility visit 1 Mobility  Mobility Specialist Start Time (ACUTE ONLY) 1452  Mobility Specialist Stop Time (ACUTE ONLY) 1503  Mobility Specialist Time Calculation (min) (ACUTE ONLY) 11 min   Pt received in EOB and agreeable to mobility. No complaints during session.  Pt to EOB after session with all needs met.    [facility]

## 2024-04-14 NOTE — ED Notes (Signed)
 Spoke with representative for [facility] asking about acceptance to their facility, let staff know we will contact the social worker as soon as able.

## 2024-04-14 NOTE — ED Notes (Signed)
 Per Psych NP patient is able to get personal cell phone to pay bills. Patient is given 30 minutes, clear understanding voiced

## 2024-04-14 NOTE — Progress Notes (Signed)
 Physical Therapy Treatment Patient Details Name: Rebekah Davis MRN: [id number] DOB: 08/28/1986 Today's Date: 04/14/2024   History of Present Illness Pt is a 38 yo  presents to Ed 04/10/24 with suicidal thoughts. 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    PT Comments  The patient is  pleased to have a WC and able to mobilize to BR independently.  Patient ambulated using Left Platform RW  and maintained NWB on  LLE. Patient ambulated with CGA x 25' x 2. Will continue progressive ambulation. Patient does need a shoe on the right foot. Continue mobility .   If plan is discharge home, recommend the following: A little help with walking and/or transfers;Assist for transportation;Assistance with cooking/housework;Help with stairs or ramp for entrance   Can travel by private vehicle        Equipment Recommendations  Rolling walker (2 wheels);Wheelchair (measurements PT);Wheelchair cushion (measurements PT) (L platform)    Recommendations for Other Services       Precautions / Restrictions Precautions Precautions: Fall Recall of Precautions/Restrictions: Intact Precaution/Restrictions Comments: limited WB through the  left  hand, needs surgery Splint/Cast: SLC left Restrictions Weight Bearing Restrictions Per Provider Order: Yes LLE Weight Bearing Per Provider Order: Non weight bearing Other Position/Activity Restrictions: per pt.     Mobility  Bed Mobility Overal bed mobility: Modified Independent                  Transfers Overall transfer level: Needs assistance                 General transfer comment: mod. independent with transfers and standing    Ambulation/Gait Ambulation/Gait assistance: Contact guard assist Gait Distance (Feet): 25 Feet (x 2) Assistive device: Left platform walker, Rolling walker (2 wheels) Gait Pattern/deviations: Step-to pattern Gait velocity: decr     General  Gait Details: patient maintained NWB  using L PFRW.   Stairs             Wheelchair Mobility     Tilt Bed    Modified Rankin (Stroke Patients Only)       Balance Overall balance assessment: Mild deficits observed, not formally tested                                          Communication Communication Communication: No apparent difficulties  Cognition Arousal: Alert     PT - Cognitive impairments: No apparent impairments                                Cueing    Exercises      General Comments        Pertinent Vitals/Pain Pain Assessment Faces Pain Scale: Hurts little more Pain Location: dorsal cast Pain Descriptors / Indicators: Aching, Tightness Pain Intervention(s): Monitored during session    Home Living                          Prior Function            PT Goals (current goals can now be found in the care plan section) Progress towards PT goals: Progressing toward goals    Frequency    Min 2X/week      PT Plan      Co-evaluation  AM-PAC PT 6 Clicks Mobility   Outcome Measure  Help needed turning from your back to your side while in a flat bed without using bedrails?: None Help needed moving from lying on your back to sitting on the side of a flat bed without using bedrails?: None Help needed moving to and from a bed to a chair (including a wheelchair)?: None Help needed standing up from a chair using your arms (e.g., wheelchair or bedside chair)?: None Help needed to walk in hospital room?: A Little Help needed climbing 3-5 steps with a railing? : Total 6 Click Score: 20    End of Session Equipment Utilized During Treatment: Gait belt Activity Tolerance: Patient tolerated treatment well Patient left: in bed;with call bell/phone within reach Nurse Communication: Mobility status PT Visit Diagnosis: Unsteadiness on feet (R26.81);Difficulty in walking, not elsewhere  classified (R26.2)     Time: 9169-9144 PT Time Calculation (min) (ACUTE ONLY): 25 min  Charges:    $Gait Training: 23-37 mins PT General Charges $$ ACUTE PT VISIT: 1 Visit                     Rebekah Davis PT Acute Rehabilitation Services Office [phone]    Rebekah Davis Norris 04/14/2024, 9:17 AM

## 2024-04-14 NOTE — ED Notes (Signed)
 [facility] called stated patient is on waiting list

## 2024-04-14 NOTE — ED Provider Notes (Signed)
 Emergency Medicine Observation Re-evaluation Note  Rebekah Davis is a 38 y.o. female, seen on rounds today.  Pt initially presented to the ED for complaints of Suicidal and IVC Currently, the patient is awake sitting in her exam room with some dry cough.  Physical Exam  BP (!) 150/98 (BP Location: Right Arm)   Pulse 86   Temp 97.6 F (36.4 C) (Oral)   Resp 16   SpO2 100%  Physical Exam General: Resting awaiting breakfast Cardiac: No murmur on my exam Lungs: Breath sounds were clear although patient did have some mild coughing Psych: No agitation at this time  ED Course / MDM  EKG:   I have reviewed the labs performed to date as well as medications administered while in observation.  Recent changes in the last 24 hours include none reported by overnight nursing however they report patient had not Tessalon  or if they are going to give her now.  Plan  Current plan is for awaiting placement.    Rim Thatch, Lonni PARAS, MD 04/14/24 [phone]

## 2024-04-14 NOTE — ED Notes (Signed)
 Patient had Metronidazole  cream at bedside but informed nurse she has not been using it due to her dermatologist told her not to use creams only antibacterial soap. RN informed patient tubes will be kept in personal medication container if patient wishes to use some, clear understanding voiced by patient.

## 2024-04-14 NOTE — Progress Notes (Signed)
 LCSW Progress Note  [id number]   Rebekah Davis  04/14/2024  9:17 AM  Description:   Inpatient Psychiatric Referral  Patient was recommended inpatient per Elveria Batter NP. There [facility] at [facility], per [facility] Cherylynn Ernst RN. Patient was referred to the following out of network facilities: Destination   Service Provider Address Phone Fax  [facility]  [address] [place] [address] [phone]  [facility]  [address] [place] [address] [phone]  [facility]  [address] [place], [place] [address] [phone]  [facility]  [address] [place], [place] [address] [phone]  [facility]  [address] [place] [address] [phone]  [facility]  [address] [place] [address] [phone]  [facility]  [address], [place] [address] [phone]  [facility]  [address], [place] [address] [phone]  [facility] EFAX  [address] [place], [place] [phone]  [facility]  [address] [place], [place] [address] [phone]  [facility]  [place] [phone]  [facility]  [address] [place], [place] [address] [phone]      Situation ongoing, CSW to continue following and update chart as more information becomes available.     [place] Sequita Wise MSW, LCSW  04/14/2024 9:17 AM

## 2024-04-14 NOTE — Telephone Encounter (Signed)
 LVM for patient to return my call

## 2024-04-14 NOTE — ED Notes (Addendum)
 Patient has been compliant this shift with meals and medications. She remains IVC and 1:1 observation. She displays paranoia and asks staff that before meds are taken out pill pack that she may look at them, tolerated well. She has not voice any SI/HI. She was seen by therapy today and is able to ambulate x1 assist with walker and independently with wheelchair. Continent to B&B. She needs assistance with ADLs, resting quietly in bed having dinner and watching television, no concerns voiced at this moment care on-going.

## 2024-04-14 NOTE — Progress Notes (Signed)
 Inpatient Psychiatric Referral  Patient was recommended inpatient per Elveria Batter, NP . There are no available beds at [facility], per [facility] Luke Sprang, RN. Patient was referred to the following out of network facilities:  Destination  Service Provider Request Status Address Phone Fax  [facility]  Pending - Request Sent [address] [place] [address] [phone]  [facility]  Pending - Request Sent [address], [place] [address] [phone]  [facility]  Pending - Request Sent [address] [place], [place] [address] [phone]  [facility]  Pending - Request Sent [address] [place] [address] [phone]  [facility]  Pending - Request Sent [address] [place] [address] [phone]  [facility]  Pending - Request Sent [address], [place] [address] [phone]  [facility]  Pending - Request Sent [address] [place] [address] [phone]  [facility]  Pending - Request Sent [address], [place] [address] [phone]  [facility]  Pending - Request Sent [address] [place] [phone]  [facility]  Pending - Request Sent [address] [place] [address] [phone]  [facility]  Pending - Request Sent [facility] [phone]  [facility]  Pending - Request Sent [address] [place] [address] [phone]  [facility]  Pending - Request [facility], [place] [phone]  [facility]  Pending - Request Sent [address] [place] [address] [phone]  [facility]  Pending - Request Sent [address], [place] [address] [phone]  [facility]  Pending - Request Sent [address] [place] [address] [phone]  [facility]  Pending - No Request Sent [address] [place], [place] [address] [phone]    Situation ongoing, CSW to continue following and update chart as more information becomes available.   Harrie Sofia MSW, LCSWA 04/14/2024  9:19PM

## 2024-04-15 ENCOUNTER — Other Ambulatory Visit: Payer: Self-pay

## 2024-04-15 ENCOUNTER — Emergency Department (HOSPITAL_BASED_OUTPATIENT_CLINIC_OR_DEPARTMENT_OTHER)
Admission: EM | Admit: 2024-04-15 | Discharge: 2024-04-15 | Disposition: A | Discharge status: Home / Self Care | Source: Home / Self Care | Attending: Emergency Medicine | Admitting: Emergency Medicine

## 2024-04-15 ENCOUNTER — Encounter (HOSPITAL_BASED_OUTPATIENT_CLINIC_OR_DEPARTMENT_OTHER): Payer: Self-pay

## 2024-04-15 DIAGNOSIS — S50811A Abrasion of right forearm, initial encounter: Secondary | ICD-10-CM | POA: Insufficient documentation

## 2024-04-15 DIAGNOSIS — F603 Borderline personality disorder: Secondary | ICD-10-CM

## 2024-04-15 DIAGNOSIS — Y92019 Unspecified place in single-family (private) house as the place of occurrence of the external cause: Secondary | ICD-10-CM | POA: Insufficient documentation

## 2024-04-15 DIAGNOSIS — S50812A Abrasion of left forearm, initial encounter: Secondary | ICD-10-CM | POA: Insufficient documentation

## 2024-04-15 DIAGNOSIS — X58XXXA Exposure to other specified factors, initial encounter: Secondary | ICD-10-CM | POA: Insufficient documentation

## 2024-04-15 DIAGNOSIS — M79673 Pain in unspecified foot: Secondary | ICD-10-CM

## 2024-04-15 MED ORDER — NAPROXEN 500 MG PO TABS: 500.0000 mg | ORAL_TABLET | Freq: Two times a day (BID) | ORAL | 0 refills | Status: DC

## 2024-04-15 MED ORDER — ACETAMINOPHEN 500 MG PO TABS
1000.0000 mg | ORAL_TABLET | Freq: Once | ORAL | Status: AC
  Administered 2024-04-15: 1000 mg via ORAL
  Filled 2024-04-15: qty 2

## 2024-04-15 MED ORDER — ARIPIPRAZOLE 5 MG PO TABS: 5.0000 mg | ORAL_TABLET | Freq: Every day | ORAL | 0 refills | Status: DC

## 2024-04-15 MED ORDER — KETOROLAC TROMETHAMINE 60 MG/2ML IM SOLN
30.0000 mg | Freq: Once | INTRAMUSCULAR | Status: DC
  Filled 2024-04-15: qty 2

## 2024-04-15 MED ORDER — ESCITALOPRAM OXALATE 10 MG PO TABS: 10.0000 mg | ORAL_TABLET | Freq: Every day | ORAL | 0 refills | Status: DC

## 2024-04-15 NOTE — Progress Notes (Signed)
 Physical Therapy Treatment Patient Details Name: Rebekah Davis MRN: [id number] DOB: [birth year] Today's Date: 04/15/2024   History of Present Illness Pt is a 38 yo  presents to Ed 04/10/24 with suicidal thoughts. 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    PT Comments  The patient plans for Dc home. Patient needs a bariatric Rw with left platform. Patient ambulated x 50' using the Device. Patient pleased with being able to return home.    If plan is discharge home, recommend the following: A little help with walking and/or transfers;Assist for transportation;Assistance with cooking/housework;Help with stairs or ramp for entrance   Can travel by private vehicle        Equipment Recommendations   (bariatric Rw, Left platform)    Recommendations for Other Services       Precautions / Restrictions Precautions Precautions: Fall Recall of Precautions/Restrictions: Intact Precaution/Restrictions Comments: limited WB through the  left  hand, needs surgery Required Braces or Orthoses: Splint/Cast Splint/Cast: SLC left Restrictions LLE Weight Bearing Per Provider Order: Non weight bearing     Mobility  Bed Mobility Overal bed mobility: Modified Independent                  Transfers Overall transfer level: Modified independent                 General transfer comment: mod. independent with transfers and standing    Ambulation/Gait Ambulation/Gait assistance: Contact guard assist Gait Distance (Feet): 50 Feet Assistive device: Left platform walker, Rolling walker (2 wheels) Gait Pattern/deviations: Step-to pattern       General Gait Details: patient maintained NWB  using L PFRW.   Stairs Stairs: Yes       General stair comments: verbally reviewed up the curb backwards and demonstrated   Wheelchair Mobility     Tilt Bed    Modified Rankin (Stroke Patients Only)       Balance  Overall balance assessment: Mild deficits observed, not formally tested                                          Communication Communication Communication: No apparent difficulties  Cognition Arousal: Alert Behavior During Therapy: WFL for tasks assessed/performed   PT - Cognitive impairments: No apparent impairments                                Cueing    Exercises      General Comments        Pertinent Vitals/Pain Pain Assessment Faces Pain Scale: Hurts little more Pain Location: left foot and back Pain Descriptors / Indicators: Aching, Tightness Pain Intervention(s): Monitored during session    Home Living                          Prior Function            PT Goals (current goals can now be found in the care plan section) Progress towards PT goals: Progressing toward goals    Frequency    Min 2X/week      PT Plan      Co-evaluation              AM-PAC PT 6 Clicks Mobility   Outcome Measure  Help needed  turning from your back to your side while in a flat bed without using bedrails?: None Help needed moving from lying on your back to sitting on the side of a flat bed without using bedrails?: None Help needed moving to and from a bed to a chair (including a wheelchair)?: None Help needed standing up from a chair using your arms (e.g., wheelchair or bedside chair)?: None Help needed to walk in hospital room?: A Little Help needed climbing 3-5 steps with a railing? : A Lot 6 Click Score: 21    End of Session   Activity Tolerance: Patient tolerated treatment well Patient left: in bed;with call bell/phone within reach Nurse Communication: Mobility status PT Visit Diagnosis: Unsteadiness on feet (R26.81);Difficulty in walking, not elsewhere classified (R26.2)     Time: 8769-8684 PT Time Calculation (min) (ACUTE ONLY): 45 min  Charges:    $Gait Training: 8-22 mins $Self Care/Home Management:  23-37 PT General Charges $$ ACUTE PT VISIT: 1 Visit                     Darice Potters PT Acute Rehabilitation Services Office [phone]    Potters Darice Norris 04/15/2024, 5:17 PM

## 2024-04-15 NOTE — Consult Note (Addendum)
 [facility] Consult Follow-up  Patient Name: .Rebekah Davis  MRN: [id number]  DOB: 30-Dec-1985  Consult Order details:  Orders (From admission, onward)     Start     Ordered   04/13/24 1628  CONSULT TO CALL ACT TEAM       Ordering Provider: Dasie Faden, MD  Provider:  (Not yet assigned)  Question:  Reason for Consult?  Answer:  IVC SI   04/13/24 1627             Mode of Visit: In person    Psychiatry Consult Evaluation  Service Date: April 15, 2024 LOS:  LOS: 0 days  Chief Complaint I want to leave  Primary Psychiatric Diagnoses  Borderline personality disorder 2.  Deliberate self harm    Assessment  Rebekah Davis is a 38 y.o. female admitted: Presented to the EDfor 04/10/2024  4:35 PM for brought in by EMS for suicidal ideation. She carries the psychiatric diagnoses of borderline personality disorder, MDD, PTSD, suicidal ideation, psychosis and anxiety and has a past medical history of HTN, COPD, GERD, polycystic ovary syndrome, achilles tendinitis, morbid obesity, endometriosis and vitamin D  deficiency.   Her current presentation of asking to leave and denying ever being suicidal is most consistent with borderline personality disorder. She meets criteria for outpatient psychiatric hospitalization based on not being a danger to herself or others and not being acutely psychotic.  Current outpatient psychotropic medications include Abilify , Atarax , and Ativan  and historically she has had a beneficial response to these medications. She was compliant with medications prior to admission as evidenced by patient report. On initial examination, patient is superficially cooperative and demanding. Please see plan below for detailed recommendations.   Diagnoses:  Active Hospital problems: Principal Problem:   Borderline personality disorder (HCC) Active Problems:   Deliberate self-cutting    Plan   ## Psychiatric Medication Recommendations:  Continue  --abilify  5mg   PO Q day --lexapro  10mg  PO Q day --hydroxyzine  25mg  PO Q 8 hours PRN anxiety --ativan  1mg  PO Q HS  ## Medical Decision Making Capacity: Not specifically addressed in this encounter  ## Further Work-up:  -- most recent EKG on 04/10/2024 had QtC of 452 -- Pertinent labwork reviewed earlier this admission includes: CBC, CMP, UA, pregnancy, alcohol and UDS   ## Disposition:-- There are no psychiatric contraindications to discharge at this time  ## Behavioral / Environmental: -To minimize splitting of staff, assign one staff person to communicate all information from the team when feasible. or Utilize compassion and acknowledge the patient's experiences while setting clear and realistic expectations for care.    ## Safety and Observation Level:  - Based on my clinical evaluation, I estimate the patient to be at low risk of self harm in the current setting. - At this time, we recommend  routine. This decision is based on my review of the chart including patient's history and current presentation, interview of the patient, mental status examination, and consideration of suicide risk including evaluating suicidal ideation, plan, intent, suicidal or self-harm behaviors, risk factors, and protective factors. This judgment is based on our ability to directly address suicide risk, implement suicide prevention strategies, and develop a safety plan while the patient is in the clinical setting. Please contact our team if there is a concern that risk level has changed.  CSSR Risk Category:C-SSRS RISK CATEGORY: High Risk  Suicide Risk Assessment: Patient has following modifiable risk factors for suicide: triggering events, which we are addressing by recommending outpatient psychiatric follow  up. Patient has following non-modifiable or demographic risk factors for suicide: history of self harm behavior and psychiatric hospitalization Patient has the following protective factors against suicide: Access to  outpatient mental health care, Supportive family, and Supportive friends  Thank you for this consult request. Recommendations have been communicated to the primary team.  We will sign off at this time.   Rebekah Davis Patient, NP       History of Present Illness  Relevant Aspects of Hospital ED Course:  Admitted on 04/10/2024 for brought in by EMS for suicidal ideation. She carries the psychiatric diagnoses of borderline personality disorder, MDD, PTSD, suicidal ideation, psychosis and anxiety and has a past medical history of HTN, COPD, GERD, polycystic ovary syndrome, achilles tendinitis, morbid obesity, endometriosis and vitamin D  deficiency.   Patient Report:  Rebekah Davis, is seen face to face by this provider, consulted with Dr. Larina; and chart reviewed on 04/15/24.  On evaluation Rebekah Davis reports I want to leave.  Patient says that she has never tried to commit suicide and that she is not depressed.  Patient says she is self harms by cutting / scratching her arms.  Patient is adamant that she is not suicidal and would like to leave.   During evaluation Rebekah Davis is sitting in a wheel chair in no acute distress.  She is alert & oriented x 4, calm, cooperative and attentive for this assessment.  Her mood is frustrated with congruent affect. She has normal speech, and behavior.  Objectively there is no evidence of psychosis/mania or delusional thinking. Pt does not appear to be responding to internal or external stimuli.  Patient is able to converse coherently, goal directed thoughts, no distractibility, or pre-occupation.  She denies suicidal/self-harm/homicidal ideation, psychosis, and paranoia.  Patient answered questions appropriately.    I personally spent a total of 70 minutes in the care of the patient today including preparing to see the patient, getting/reviewing separately obtained history, performing a medically appropriate exam/evaluation, counseling and educating, placing orders,  referring and communicating with other health care professionals, documenting clinical information in the EHR, independently interpreting results, and coordinating care.  Psych ROS:  Depression: denies Anxiety:  denies Mania (lifetime and current): denies Psychosis: (lifetime and current): denies  Collateral information:  See note by Chesley Holt [facility] for collateral information  Review of Systems  Musculoskeletal:        Left foot in cast  All other systems reviewed and are negative.    Psychiatric and Social History  Psychiatric History:  Information collected from patient and chart review  Prev Dx/Sx: borderline personality disorder, MDD, PTSD, suicidal ideation, psychosis and anxiety Current Psych Provider: yes Home Meds (current): lexapro , abilify , ativan  and hydroxyzine  Previous Med Trials: unknown Therapy: not currently  Prior Psych Hospitalization: Yes  Prior Self Harm: yes, cutting  Prior Violence: denies  Family Psych History: Mom - borderline personality disorder Family Hx suicide: denies  Social History:  Developmental Hx: WNL Educational Hx: some college Occupational Hx: disabled Legal Hx: none noted Living Situation: lives in transitional housing Spiritual Hx: none noted Access to weapons/lethal means: denies   Substance History Alcohol: denies   Tobacco: denies Illicit drugs: denies Prescription drug abuse: denies Rehab hx: denies  Exam Findings  Physical Exam:  Vital Signs:  Temp:  [97.9 F (36.6 C)-98.1 F (36.7 C)] 97.9 F (36.6 C) (07/02 0732) Pulse Rate:  [60-95] 95 (07/02 0732) Resp:  [18] 18 (07/02 0732) BP: (145-154)/(79-87) 145/85 (07/02 0732) SpO2:  [  97 %-100 %] 100 % (07/02 0732) Blood pressure (!) 145/85, pulse 95, temperature 97.9 F (36.6 C), temperature source Oral, resp. rate 18, SpO2 100%. There is no height or weight on file to calculate BMI.  Physical Exam Vitals and nursing note reviewed.  Constitutional:       Appearance: She is obese.  Eyes:     Pupils: Pupils are equal, round, and reactive to light.  Pulmonary:     Effort: Pulmonary effort is normal.  Skin:    General: Skin is dry.  Neurological:     Mental Status: She is alert and oriented to person, place, and time.  Psychiatric:        Attention and Perception: Attention and perception normal.        Mood and Affect: Mood and affect normal.        Speech: Speech normal.        Behavior: Behavior is cooperative.        Thought Content: Thought content normal.        Cognition and Memory: Cognition and memory normal.     Mental Status Exam: General Appearance: Disheveled  Orientation:  Full (Time, Place, and Person)  Memory:  Immediate;   Good Recent;   Good Remote;   Good  Concentration:  Concentration: Good  Recall:  Good  Attention  Good  Eye Contact:  Good  Speech:  Clear and Coherent  Language:  Good  Volume:  Normal  Mood: Frustrated  Affect:  Congruent  Thought Process:  Goal Directed  Thought Content:  Logical  Suicidal Thoughts:  No  Homicidal Thoughts:  No  Judgement:  Impaired  Insight:  Shallow  Psychomotor Activity:  Normal  Akathisia:  No  Fund of Knowledge:  Fair      Assets:  Architect Housing Leisure Time  Cognition:  WNL  ADL's:  Intact  AIMS (if indicated):        Other History   These have been pulled in through the EMR, reviewed, and updated if appropriate.  Family History:  The patient's family history includes Depression in her mother; Diabetes in her father and mother; Heart disease in her paternal grandmother; Hypertension in her father and mother; Kidney disease in her mother; Other in her maternal grandmother; Ovarian cancer in her maternal grandmother; Stroke in her paternal grandfather; Thyroid  disease in her mother.  Medical History: Past Medical History:  Diagnosis Date   Abdominal pain, suprapubic 06/04/2016   Abnormal Pap smear of  cervix    Acute cystitis 03/10/2018   Anxiety    Arthritis    C. difficile diarrhea 06/12/2018   Cataract    Chest pain 03/28/2018   Depression    Depression with suicidal ideation 03/09/2018   Diarrhea 06/13/2018   GERD (gastroesophageal reflux disease)    History of kidney stones    Hypomagnesemia 06/13/2018   Kidney stone    Low back pain 07/22/2012   Migraine    Non-suicidal self harm as coping mechanism (HCC) 12/25/2019   Oral thrush 06/12/2018   Ovarian cyst    PCOS (polycystic ovarian syndrome)    PTSD (post-traumatic stress disorder)    Scarlet fever    Sleep apnea    Substance abuse (HCC)    Suicidal ideation 04/12/2018    Surgical History: Past Surgical History:  Procedure Laterality Date   BILATERAL SALPINGOECTOMY     LUMBAR LAMINECTOMY     RIGHT OOPHERECTOMY Right    TENOLYSIS Left 10/04/2020  Procedure: LEFT ACHILLES TENDON DEBRIDEMENT AND RECONSTRUCTION, CALCANEAL EXOSTECTOMY, GASTROCNEMIUS RECESSION,  FLEXOR HALLUCIS LONGUS TRANSFER;  Surgeon: Elsa Lonni SAUNDERS, MD;  Location: [facility] OR;  Service: Orthopedics;  Laterality: Left;  LENGTH OF SURGERY: 1.5 HOURS   TONSILLECTOMY       Medications:   Current Facility-Administered Medications:    acetaminophen  (TYLENOL ) tablet 1,000 mg, 1,000 mg, Oral, Q8H, Kommor, Madison, MD, 1,000 mg at 04/15/24 0537   alum & mag hydroxide-simeth (MAALOX/MYLANTA) 200-200-20 MG/5ML suspension 30 mL, 30 mL, Oral, Q6H PRN, Long, Joshua G, MD   ARIPiprazole  (ABILIFY ) tablet 5 mg, 5 mg, Oral, Daily, Long, Joshua G, MD, 5 mg at 04/15/24 0901   aspirin  chewable tablet 81 mg, 81 mg, Oral, Daily, Kommor, Madison, MD, 81 mg at 04/15/24 0900   bacitracin  ointment, , Topical, PRN, Kommor, Madison, MD, Given at 04/12/24 2304   benzonatate  (TESSALON ) capsule 200 mg, 200 mg, Oral, TID PRN, Ula Prentice SAUNDERS, MD, 200 mg at 04/14/24 1455   escitalopram  (LEXAPRO ) tablet 10 mg, 10 mg, Oral, QHS, Byungura, Veronique M, NP, 10 mg at 04/14/24 2117    hydrOXYzine  (ATARAX ) tablet 25 mg, 25 mg, Oral, Q8H PRN, Long, Joshua G, MD, 25 mg at 04/14/24 1332   LORazepam  (ATIVAN ) tablet 1 mg, 1 mg, Oral, QHS, Ula Prentice SAUNDERS, MD, 1 mg at 04/14/24 2117   methocarbamol  (ROBAXIN ) tablet 500 mg, 500 mg, Oral, Q8H PRN, Belfi, Melanie, MD, 500 mg at 04/14/24 1454   metroNIDAZOLE  (METROGEL ) 0.75 % gel, , Topical, BID, Kommor, Madison, MD   naproxen (NAPROSYN) tablet 500 mg, 500 mg, Oral, BID WC, Kommor, Madison, MD, 500 mg at 04/15/24 0859   ondansetron  (ZOFRAN ) tablet 4 mg, 4 mg, Oral, Q8H PRN, Long, Joshua G, MD   oxyCODONE  (Oxy IR/ROXICODONE ) immediate release tablet 5 mg, 5 mg, Oral, Q4H PRN, Kommor, Madison, MD, 5 mg at 04/15/24 0739   senna-docusate (Senokot-S) tablet 1 tablet, 1 tablet, Oral, QHS PRN, Long, Fonda MATSU, MD, 1 tablet at 04/14/24 0620   [START ON 04/16/2024] Vitamin D  (Ergocalciferol ) (DRISDOL ) 1.25 MG (50000 UNIT) capsule 50,000 Units, 50,000 Units, Oral, Q7 days, Byungura, Veronique M, NP  Current Outpatient Medications:    acetaminophen  (TYLENOL ) 500 MG tablet, Take 1,000 mg by mouth 2 (two) times daily., Disp: , Rfl:    albuterol  (VENTOLIN  HFA) 108 (90 Base) MCG/ACT inhaler, Inhale 1 puff into the lungs every 4 (four) hours as needed. (Patient taking differently: Inhale 1 puff into the lungs every 4 (four) hours as needed for shortness of breath or wheezing (seek medical care if SVT(s) result).), Disp: 18 g, Rfl: 3   ARIPiprazole  (ABILIFY ) 5 MG tablet, Take 1 tablet (5 mg total) by mouth at bedtime for mood. (Patient taking differently: Take 5 mg by mouth every morning.), Disp: 14 tablet, Rfl: 0   cyclobenzaprine  (FLEXERIL ) 5 MG tablet, Take 1 tablet (5 mg total) by mouth at bedtime as needed. (Patient not taking: Reported on 04/09/2024), Disp: 14 tablet, Rfl: 0   ergocalciferol  (VITAMIN D2) 1.25 MG (50000 UT) capsule, Take 1 capsule (50,000 Units total) by mouth once a week. (Patient taking differently: Take 50,000 Units by mouth every Friday.),  Disp: 4 capsule, Rfl: 2   hydrOXYzine  (ATARAX ) 25 MG tablet, Take 1 tablet (25 mg total) by mouth 3 (three) times daily as needed for anxiety. (Patient taking differently: Take 25 mg by mouth See admin instructions. Take 25 mg by mouth three times a day AND at bedtime as needed for anxiety or sleep),  Disp: 30 tablet, Rfl: 0   ibuprofen  (ADVIL ) 800 MG tablet, Take 800 mg by mouth 3 (three) times daily., Disp: , Rfl:    lamoTRIgine  (LAMICTAL ) 100 MG tablet, Take 1 tablet (100 mg total) by mouth 2 (two) times daily. (Patient not taking: Reported on 04/09/2024), Disp: 60 tablet, Rfl: 11   levonorgestrel (MIRENA, 52 MG,) 20 MCG/DAY IUD, 1 each by Intrauterine route once., Disp: , Rfl:    LORazepam  (ATIVAN ) 0.5 MG tablet, Take 1-2 tablets (0.5-1 mg total) by mouth every 4-6 hours as needed for severe panic attacks. (Patient not taking: Reported on 04/09/2024), Disp: 10 tablet, Rfl: 2   LORazepam  (ATIVAN ) 1 MG tablet, Take 1 mg by mouth at bedtime., Disp: , Rfl:    methocarbamol  (ROBAXIN ) 500 MG tablet, Take 500 mg by mouth every 8 (eight) hours as needed for muscle spasms., Disp: , Rfl:    ondansetron  (ZOFRAN ) 4 MG tablet, Take 4 mg by mouth every 8 (eight) hours as needed for nausea or vomiting., Disp: , Rfl:    phenazopyridine  (PYRIDIUM ) 100 MG tablet, Take 1 tablet (100 mg total) by mouth 2 (two) times daily for Dysuria. (Patient not taking: Reported on 04/09/2024), Disp: 28 tablet, Rfl: 0   polyethylene glycol powder (GLYCOLAX /MIRALAX ) 17 GM/SCOOP powder, Take 17 g by mouth daily as needed for mild constipation or moderate constipation., Disp: , Rfl:    sennosides-docusate sodium (SENOKOT-S) 8.6-50 MG tablet, Take 1 tablet by mouth daily., Disp: , Rfl:    tamsulosin  (FLOMAX ) 0.4 MG CAPS capsule, Take 1 capsule (0.4 mg total) by mouth daily. (Patient not taking: Reported on 04/09/2024), Disp: 30 capsule, Rfl: 0  Allergies: Allergies  Allergen Reactions   Adhesive [Tape] Other (See Comments)    blister    Cherry Anaphylaxis and Rash   Sulfamethoxazole-Trimethoprim Hives   Levofloxacin Other (See Comments)    Muscle Pain   Morphine Other (See Comments)    Feels like unable to breath or swallow    Tizanidine Diarrhea   Gabapentin Rash   Metformin Nausea And Vomiting and Other (See Comments)    Diaphoresis    Sulfa Antibiotics Hives and Rash   Sumatriptan Diarrhea, Nausea And Vomiting and Other (See Comments)    Decreased heart rate and respiratory rate      Rebekah Davis Patient, NP

## 2024-04-15 NOTE — ED Notes (Signed)
 Ptmedically cleared for dc. Pt laying in bed, refusing to listen to DC instructions. Requesting to speak with the doctor. Pt states I dont want to be discharged, I am still in pain Pt refused pain medication ordered, states its because I dont want that medicine MD aware pt would like to speak with them.

## 2024-04-15 NOTE — ED Triage Notes (Addendum)
 Pt reports ongoing left foot and ankle pain. Pt has fracture with hard cast in place. Pt reports she was just d/c from [facility] and was started on psych meds and has no prescriptions for them. Pt also reports she does not have any pain medication at home. Pt was receiving naproxen and tylenol  at [facility]. Pt requesting meds for pain control. Pt drove herself here.

## 2024-04-15 NOTE — ED Notes (Addendum)
 [facility] provided pt with contact information for [employer] where pt had started the process of establishing an ACTT. Pt reports that she had completed paperwork but the process stopped when her worker had taken leave due to a personal issue. Pt agreed to follow up with [facility] to complete the intake process.   [facility] also provided pt with three referrals for therapists that specialize in trauma, DID, and Borderline Personality Disorder as well as contact information to inquire about reactivating her Medicaid. Per [facility] social worker, pt has active [employer] which will need to be converted to regular [employer]. Pt was encouraged to contact her Care Coordinator at [facility] for assistance.   Chesley Holt, [facility]  04/15/24

## 2024-04-15 NOTE — ED Provider Notes (Signed)
 [place] EMERGENCY DEPARTMENT AT [facility] Provider Note   CSN: [id number] Arrival date & time: 04/15/24  2113     Patient presents with: Foot Pain   Rebekah Davis is a 38 y.o. female.  {Add pertinent medical, surgical, social history, OB history to YEP:67052} The history is provided by the patient.  Foot Pain The current episode started more than 1 week ago. The problem occurs constantly. The problem has not changed since onset.Pertinent negatives include no chest pain, no abdominal pain, no headaches and no shortness of breath. Nothing aggravates the symptoms. Nothing relieves the symptoms. The treatment provided no relief.   in a cast for tendinitis     Past Medical History:  Diagnosis Date   Abdominal pain, suprapubic 06/04/2016   Abnormal Pap smear of cervix    Acute cystitis 03/10/2018   Anxiety    Arthritis    C. difficile diarrhea 06/12/2018   Cataract    Chest pain 03/28/2018   Depression    Depression with suicidal ideation 03/09/2018   Diarrhea 06/13/2018   GERD (gastroesophageal reflux disease)    History of kidney stones    Hypomagnesemia 06/13/2018   Kidney stone    Low back pain 07/22/2012   Migraine    Non-suicidal self harm as coping mechanism (HCC) 12/25/2019   Oral thrush 06/12/2018   Ovarian cyst    PCOS (polycystic ovarian syndrome)    PTSD (post-traumatic stress disorder)    Scarlet fever    Sleep apnea    Substance abuse (HCC)    Suicidal ideation 04/12/2018     Prior to Admission medications   Medication Sig Start Date End Date Taking? Authorizing Provider  acetaminophen  (TYLENOL ) 500 MG tablet Take 1,000 mg by mouth 2 (two) times daily.    [provider]  albuterol  (VENTOLIN  HFA) 108 (90 Base) MCG/ACT inhaler Inhale 1 puff into the lungs every 4 (four) hours as needed. Patient taking differently: Inhale 1 puff into the lungs every 4 (four) hours as needed for shortness of breath or wheezing (seek medical care if  SVT(s) result). 02/14/24     ARIPiprazole  (ABILIFY ) 5 MG tablet Take 1 tablet (5 mg total) by mouth at bedtime for mood. Patient taking differently: Take 5 mg by mouth every morning. 12/28/23     ARIPiprazole  (ABILIFY ) 5 MG tablet Take 1 tablet (5 mg total) by mouth daily. 04/15/24   Ula Prentice SAUNDERS, MD  cyclobenzaprine  (FLEXERIL ) 5 MG tablet Take 1 tablet (5 mg total) by mouth at bedtime as needed. Patient not taking: Reported on 04/09/2024 02/17/24     ergocalciferol  (VITAMIN D2) 1.25 MG (50000 UT) capsule Take 1 capsule (50,000 Units total) by mouth once a week. Patient taking differently: Take 50,000 Units by mouth every Friday. 10/23/23     escitalopram  (LEXAPRO ) 10 MG tablet Take 1 tablet (10 mg total) by mouth daily. 04/15/24   Ula Prentice SAUNDERS, MD  hydrOXYzine  (ATARAX ) 25 MG tablet Take 1 tablet (25 mg total) by mouth 3 (three) times daily as needed for anxiety. Patient taking differently: Take 25 mg by mouth See admin instructions. Take 25 mg by mouth three times a day AND at bedtime as needed for anxiety or sleep 11/21/22   Dasie Ellouise CROME, FNP  lamoTRIgine  (LAMICTAL ) 100 MG tablet Take 1 tablet (100 mg total) by mouth 2 (two) times daily. Patient not taking: Reported on 04/09/2024 11/26/23   Onita Duos, MD  levonorgestrel (MIRENA, 52 MG,) 20 MCG/DAY IUD 1 each by Intrauterine  route once.    [provider]  LORazepam  (ATIVAN ) 0.5 MG tablet Take 1-2 tablets (0.5-1 mg total) by mouth every 4-6 hours as needed for severe panic attacks. Patient not taking: Reported on 04/09/2024 02/18/24     LORazepam  (ATIVAN ) 1 MG tablet Take 1 mg by mouth at bedtime.    [provider]  methocarbamol  (ROBAXIN ) 500 MG tablet Take 500 mg by mouth every 8 (eight) hours as needed for muscle spasms. 03/08/24   [provider]  naproxen (NAPROSYN) 500 MG tablet Take 1 tablet (500 mg total) by mouth 2 (two) times daily. 04/15/24   Ula Prentice SAUNDERS, MD  ondansetron  (ZOFRAN ) 4 MG tablet Take 4 mg by mouth every 8  (eight) hours as needed for nausea or vomiting.    [provider]  phenazopyridine  (PYRIDIUM ) 100 MG tablet Take 1 tablet (100 mg total) by mouth 2 (two) times daily for Dysuria. Patient not taking: Reported on 04/09/2024 12/28/23     polyethylene glycol powder (GLYCOLAX /MIRALAX ) 17 GM/SCOOP powder Take 17 g by mouth daily as needed for mild constipation or moderate constipation.    [provider]  sennosides-docusate sodium (SENOKOT-S) 8.6-50 MG tablet Take 1 tablet by mouth daily.    [provider]  tamsulosin  (FLOMAX ) 0.4 MG CAPS capsule Take 1 capsule (0.4 mg total) by mouth daily. Patient not taking: Reported on 04/09/2024 01/24/24       Allergies: Adhesive [tape], Cherry, Sulfamethoxazole-trimethoprim, Levofloxacin, Morphine, Tizanidine, Gabapentin, Metformin, Sulfa antibiotics, and Sumatriptan    Review of Systems  Respiratory:  Negative for shortness of breath.   Cardiovascular:  Negative for chest pain.  Gastrointestinal:  Negative for abdominal pain.  Neurological:  Negative for headaches.    Updated Vital Signs BP (!) 145/94   Pulse (!) 116   Temp 98 F (36.7 C)   Resp 20   SpO2 100%   Physical Exam Vitals and nursing note reviewed.  Constitutional:      General: She is not in acute distress.    Appearance: Normal appearance. She is well-developed. She is not ill-appearing.  HENT:     Head: Normocephalic and atraumatic.     Nose: Nose normal.  Eyes:     Pupils: Pupils are equal, round, and reactive to light.  Cardiovascular:     Rate and Rhythm: Normal rate and regular rhythm.     Pulses: Normal pulses.     Heart sounds: Normal heart sounds.  Pulmonary:     Effort: No respiratory distress.     Breath sounds: Normal breath sounds.  Abdominal:     General: Bowel sounds are normal. There is no distension.     Palpations: Abdomen is soft.     Tenderness: There is no abdominal tenderness. There is no guarding or rebound.  Musculoskeletal:         General: Normal range of motion.     Cervical back: Neck supple.     Comments: Superficial scratches of bilateral volar forearms   Skin:    General: Skin is warm and dry.     Capillary Refill: Capillary refill takes less than 2 seconds.     Findings: No erythema or rash.  Neurological:     General: No focal deficit present.     Mental Status: She is alert and oriented to person, place, and time.     Deep Tendon Reflexes: Reflexes normal.     (all labs ordered are listed, but only abnormal results are displayed) Labs Reviewed -  No data to display  EKG: None  Radiology: No results found.  {Document cardiac monitor, telemetry assessment procedure when appropriate:32947} Procedures   Medications Ordered in the ED  ketorolac  (TORADOL ) injection 30 mg (has no administration in time range)  acetaminophen  (TYLENOL ) tablet 1,000 mg (1,000 mg Oral Given 04/15/24 2133)      {Click here for ABCD2, HEART and other calculators REFRESH Note before signing:1}                              Medical Decision Making Patient discharged after 5 pm from [place], is requesting pain medication until she can see her orthopedic surgeon at [facility] in the am   Risk OTC drugs. Prescription drug management.    Final diagnoses:  Pain of foot, unspecified laterality   The patient is nontoxic-appearing on exam and vital signs are within normal limits.  I have reviewed the triage vital signs and the nursing notes. Pertinent labs & imaging results that were available during my care of the patient were reviewed by me and considered in my medical decision making (see chart for details). After history, exam, and medical workup I feel the patient has been appropriately medically screened and is safe for discharge home. Pertinent diagnoses were discussed with the patient. Patient was given return precautions. ED Discharge Orders     ED Discharge Orders     None

## 2024-04-15 NOTE — Discharge Instructions (Signed)
 Return to the ER if you develop thoughts of wanting to hurt or kill yourself or any other new/concerning symptoms.

## 2024-04-15 NOTE — Progress Notes (Addendum)
 TOC consulted for assistance with reactivating Medicaid and any other needs for pt to transition into the community. Per chart review, pt has Medicaid Family Planning as of 7/1.Pt will need to follow up with Medicaid caseworker at [facility] with any questions regarding converting back to regular community Medicaid. Social Services resources are attached to AVS. No additional TOC needs identified at this time.

## 2024-04-15 NOTE — ED Notes (Signed)
 Pt requesting to get cast removed here. Services of cast removal not at this campus. Pt recently seen at other facility and refused cast removal. Pt educated to follow up with orthopedics, information in DC instructions. Pt denied pain meds ordered at this time. VS WNL. Pt dispo home.

## 2024-04-15 NOTE — ED Provider Notes (Signed)
 Emergency Medicine Observation Re-evaluation Note  Rebekah Davis is a 38 y.o. female, seen on rounds today.  Pt initially presented to the ED for complaints of Suicidal and IVC Currently, the patient is resting. She is complaining of pain in her leg.  Physical Exam  BP (!) 145/85 (BP Location: Right Arm)   Pulse 95   Temp 97.9 F (36.6 C) (Oral)   Resp 18   SpO2 100%  Physical Exam General: no distress Lungs: normal effort Psych: no agitation  ED Course / MDM  EKG:   I have reviewed the labs performed to date as well as medications administered while in observation.  No recent changes in last 24 hours  Plan  Current plan is for inpatient psychiatric admission.    Freddi Hamilton, MD 04/15/24 [phone]

## 2024-04-15 NOTE — ED Notes (Signed)
 EDMD at bedside. Explained to pt that she is medically cleared for discharge and needs to follow dc instructions and followup with orthopedics. Pt still refusing to leave. Security called and at bedside. Escorted pt out to car via WC. Not in visible distress with all belongings.

## 2024-04-15 NOTE — ED Notes (Signed)
 Pt refusing ordered pain meds. States I dont want another NSAID explained to pt that is what medication has been ordered for her pain at this time. Pt still refusing. Elevated pt leg with blankets per request.

## 2024-04-15 NOTE — ED Notes (Addendum)
 [facility] called pts case Production designer, theatre/television/film at [facility], pts transitional housing program for collateral information. [facility] left a HIPAA complaint message for Mr. Myrna to return call.  [facility] called Burnard Lewis, pts friend for collateral information. Per pts friend, pt is living in transitional housing (Rent: $300) and receives SSDI ($1400 monthly). Pt also pays utilities, car insurance, phone and makes credit card payments. Pt is eligible to stay in her transitional housing for up to two years. Per Ms. Lewis pt has Medicare and had Medicaid up until two days ago.   Per Ms. Lewis pt has been diagnosed with Borderline Personality Disorder, DID, PTSD. Ms. Lewis feels that pt needs someone to help her stay on task and manage appointments. Ms. Lewis also feels that pt needs a therapist who specializes in trauma and DID. Ms. Lewis said that when pt is discharged she may able to assist with transportation home. Ms. Lewis agreed to check in with pt periodically to ensure that pt is safe after discharge and will continue to assist pt weekly.   [facility] called pts sister, Adria Costley for collateral information. Per pts sister pt has a supportive, loving family who tries to stay in contact with pt but pt often disappears for periods of time, changing her phone number so that her family can't reach her. This distancing behavior began after pt attended college. Pts sister last talked pt about 1-2 months ago. Pt was trying to invite pt to attend a concert with her family but they have not been able to reach her. Pt had asked to move in with her sister recently but their father had moved in with pts sister and the landlord was not able to allow for another person to reside at the apartment. Pts father had been sending pt money to help her but recently stopped when pt was no longer in contact.  Per pts sister, pt has been in and out of the hospital for mental health and medical issues. Per pts sister, pt always has  something. Per pts sister, pt has often threatened suicide but to her knowledge has not attempted suicide. Pt sister reports that pt has remarked in the past that their brother had done something to her. Pts sister reports a family history of mental illness with their mother having been diagnosed with Borderline Personality Disorder and possibly Bipolar. Pts sister reports that their mother also was in and out of the hospital often and suffered from drug addiction as well. Pts sister describes pt as being very immature. Pt was very involved in her church and received strong support from her church community.   Chesley Holt, [facility]  04/15/24

## 2024-04-15 NOTE — ED Provider Notes (Signed)
 Patient is screaming from the room before entrance.  Patient is angry and aggressive and demanding narcotic pain medication. She screamed at EDP when EDP attempted to get a history.  She is covered in scratches.  But she is not SI or HI at this time.  Patient was just discharged today from [facility] after being cleared by psychiatry.  She has an history of self harm.  It appears cast was supposed to come off earlier in the day and patient refused for it to be removed.[name]  She was ordered Toradol  which she refused in the ED this evening.  Given her demands and history of self harm and the fact that cast is supposed to be off as she has tendinitis post operative for more than a 6 weeks I will not be prescribing narcotics due to the risk of overdose and the fact they are not indicated.  Stable for discharge   Patient then refused discharge stating she will harm herself if I do not give her narcotics.  She was not SI and I stated I will not be giving her a medication she can overdose on.     [name], MD 04/16/24 [phone]

## 2024-04-15 NOTE — ED Provider Notes (Signed)
 Psychiatry has cleared patient for discharge.  On my reevaluation, patient states she was never suicidal but was only harming herself.  She feels stable for discharge and psych has recommended discharge and rescinding IVC.  Seems medically and psychiatrically cleared for discharge.   Freddi Hamilton, MD 04/15/24 1434

## 2024-04-16 ENCOUNTER — Emergency Department (HOSPITAL_COMMUNITY)

## 2024-04-16 ENCOUNTER — Emergency Department (HOSPITAL_COMMUNITY)
Admission: EM | Admit: 2024-04-16 | Discharge: 2024-04-16 | Disposition: A | Discharge status: Home / Self Care | Attending: Emergency Medicine | Admitting: Emergency Medicine

## 2024-04-16 ENCOUNTER — Encounter (HOSPITAL_COMMUNITY): Payer: Self-pay | Admitting: Emergency Medicine

## 2024-04-16 DIAGNOSIS — M25522 Pain in left elbow: Secondary | ICD-10-CM | POA: Insufficient documentation

## 2024-04-16 DIAGNOSIS — R519 Headache, unspecified: Secondary | ICD-10-CM | POA: Insufficient documentation

## 2024-04-16 DIAGNOSIS — W19XXXA Unspecified fall, initial encounter: Secondary | ICD-10-CM | POA: Diagnosis not present

## 2024-04-16 DIAGNOSIS — F603 Borderline personality disorder: Secondary | ICD-10-CM | POA: Diagnosis not present

## 2024-04-16 DIAGNOSIS — R110 Nausea: Secondary | ICD-10-CM | POA: Diagnosis not present

## 2024-04-16 MED ORDER — ACETAMINOPHEN 500 MG PO TABS
1000.0000 mg | ORAL_TABLET | Freq: Once | ORAL | Status: AC
Start: 2024-04-16 — End: 2024-04-16
  Administered 2024-04-16: 1000 mg via ORAL
  Filled 2024-04-16: qty 2

## 2024-04-16 MED ORDER — ONDANSETRON 4 MG PO TBDP
4.0000 mg | ORAL_TABLET | Freq: Once | ORAL | Status: AC
  Administered 2024-04-16: 4 mg via ORAL
  Filled 2024-04-16: qty 1

## 2024-04-16 MED ORDER — IBUPROFEN 800 MG PO TABS
800.0000 mg | ORAL_TABLET | Freq: Once | ORAL | Status: AC
  Administered 2024-04-16: 800 mg via ORAL
  Filled 2024-04-16: qty 1

## 2024-04-16 MED ORDER — BACITRACIN ZINC 500 UNIT/GM EX OINT
TOPICAL_OINTMENT | Freq: Two times a day (BID) | CUTANEOUS | Status: DC
  Filled 2024-04-16: qty 3.6

## 2024-04-16 NOTE — ED Triage Notes (Addendum)
 Pt arriving via [facility] for fall. Pt has had multiple falls recently. Currently has left foot in cast. Pt A&O x4. Pt reports she is not sure if she hit her head. Has nausea, no vomiting.

## 2024-04-16 NOTE — Discharge Planning (Signed)
 LATE ENTRY  Sam Devonshire, BSN, RN, UTAH [phone] Pt qualifies for DME [facility]) bariatric rolling walker with left platform.  DME  ordered through [employer].  Jermaine of [employer] notified to deliver DME to pt room prior to D/C home.

## 2024-04-16 NOTE — Discharge Planning (Addendum)
 Pt returned due to fall at home and states her bedside commode is too small.  Pt qualifies for DME (Durable Medical Equipment) 3n1.  DME  ordered through [employer].  Jermaine of [employer] notified to deliver DME to pt room prior to D/C home.  Pt also reports faulty rolling walker from yesterday.  [employer] will take a look and repair or place as needed.  Lofton Leon J. Debarah, BSN, RN, NCM  Transitions of Care  Nurse Case Manager  [facility]artments  Operative Services  [phone]

## 2024-04-16 NOTE — ED Provider Notes (Signed)
 Patient medically clear for DC by previous provider.  See his note.  In short, patient presents to Emergency Department via EMS for evaluation of a fall today.  She reports that she hit her left elbow and had a headache.  CT head, left elbow x-ray obtained.  Fortunately, they were unremarkable.  Patient was prescribed Tylenol  for pain.    Of note, unrelated to fall today, TOC, social work was consulted for bedside commode as her commode at home is too small.  They will have commode delivered tomorrow  Prior to DC, patient has multiple other concerns to include location of medications, inability to get herself home, interested in home health care, continued pain.  Social work has been notified and recommends home health order.  I have placed this order.  I have also provided ibuprofen  for pain.  [facility] are called for transportation home.  Previously, Dr. ONEIDA ordered Abilify , naproxen, Lexapro  refills for patient and sent them to [facility] pharmacy in [place].  Patient reports that she was not aware of these being sent there.  I discussed that she can have these sent to [facility] if this is more reasonable financially and medication wise however she would prefer to keep them at [facility] pharmacy in [place].  I have also informed her that these medications are there via her discharge paperwork.  Discussed ED workup, disposition, return to ED precautions with patient who expresses understanding agrees with plan.  All questions answered to their satisfaction.  They are agreeable to plan.  Discharge instructions provided on paperwork   Minnie Tinnie BRAVO, PA 04/16/24 1626    Laurice Maude BROCKS, MD 04/16/24 2217

## 2024-04-16 NOTE — Progress Notes (Signed)
 RN requested TOC speak with pt via secure chat. This Clinical research associate noted she spoke with pt earlier and pt expressed concerns for faulty RW, 3n1, and medications. CSW notified care team via secure chat and all concerns were addressed. At time of discharge, pt requested [facility] assistance which RNCM addressed. Per RN, pt called HH agency and was told they do not assist with ADLs and pt is reportedly refusing to leave because of this.   Per chart review, pt has not previously had assistance with ADLs and has presented to ED for unrelated concerns.   CSW notified Charge RN that [facility] has addressed pt's concerns and there is nothing else we are able to offer at this time. Pt is not a candidate for SNF and would require a 3 midnight inpatient admission before beginning workup.   TOC will sign off at this time.

## 2024-04-16 NOTE — ED Notes (Signed)
 Pt informed by RN that home health care is set up. Pt wants to know what company is delivering her supplies

## 2024-04-16 NOTE — ED Provider Notes (Signed)
 [place] AT [facility] Provider Note   CSN: [id number] Arrival date & time: 04/16/24  1000     Patient presents with: [name]   Rebekah Davis is a 38 y.o. female.   38 year old female presents today via EMS for concern of a fall.  She states that the sidewalk leading into her house is at an angle and she tripped and fell.  She states she was unable to get up immediately after her friend was unable to help her so they called 911.  They gave her some return precautions for head injury and she was assisted into the house.  She states she laid down and woke up around 6 AM this morning with headache and nausea and was unsure what else to do so she came in for evaluation.  She believes that she was prematurely discharged yesterday.  She states she returned after being discharged for eval of pain however she states that she was discharged with the pain medication.  She states after she was discharged she used a razor to cut her wrist and called 80 which she states is an emergency line for mental health concern.  Ultimately she went home.  She is expressing frustration not having any pain medicine and not being prescribed any pain medicine.  She states she reached out to her orthopedic office        Prior to Admission medications   Medication Sig Start Date End Date Taking? Authorizing Provider  acetaminophen  (TYLENOL ) 500 MG tablet Take 1,000 mg by mouth 2 (two) times daily.    [provider]  albuterol  (VENTOLIN  HFA) 108 (90 Base) MCG/ACT inhaler Inhale 1 puff into the lungs every 4 (four) hours as needed. Patient taking differently: Inhale 1 puff into the lungs every 4 (four) hours as needed for shortness of breath or wheezing (seek medical care if SVT(s) result). 02/14/24     ARIPiprazole  (ABILIFY ) 5 MG tablet Take 1 tablet (5 mg total) by mouth at bedtime for mood. Patient taking differently: Take 5 mg by mouth every morning. 12/28/23     ARIPiprazole   (ABILIFY ) 5 MG tablet Take 1 tablet (5 mg total) by mouth daily. 04/15/24   Ula Prentice SAUNDERS, MD  cyclobenzaprine  (FLEXERIL ) 5 MG tablet Take 1 tablet (5 mg total) by mouth at bedtime as needed. Patient not taking: Reported on 04/09/2024 02/17/24     ergocalciferol  (VITAMIN D2) 1.25 MG (50000 UT) capsule Take 1 capsule (50,000 Units total) by mouth once a week. Patient taking differently: Take 50,000 Units by mouth every Friday. 10/23/23     escitalopram  (LEXAPRO ) 10 MG tablet Take 1 tablet (10 mg total) by mouth daily. 04/15/24   Ula Prentice SAUNDERS, MD  hydrOXYzine  (ATARAX ) 25 MG tablet Take 1 tablet (25 mg total) by mouth 3 (three) times daily as needed for anxiety. Patient taking differently: Take 25 mg by mouth See admin instructions. Take 25 mg by mouth three times a day AND at bedtime as needed for anxiety or sleep 11/21/22   Dasie Ellouise CROME, FNP  lamoTRIgine  (LAMICTAL ) 100 MG tablet Take 1 tablet (100 mg total) by mouth 2 (two) times daily. Patient not taking: Reported on 04/09/2024 11/26/23   Onita Duos, MD  levonorgestrel (MIRENA, 52 MG,) 20 MCG/DAY IUD 1 each by Intrauterine route once.    [provider]  LORazepam  (ATIVAN ) 0.5 MG tablet Take 1-2 tablets (0.5-1 mg total) by mouth every 4-6 hours as needed for severe panic attacks. Patient not  taking: Reported on 04/09/2024 02/18/24     LORazepam  (ATIVAN ) 1 MG tablet Take 1 mg by mouth at bedtime.    [provider]  methocarbamol  (ROBAXIN ) 500 MG tablet Take 500 mg by mouth every 8 (eight) hours as needed for muscle spasms. 03/08/24   [provider]  naproxen  (NAPROSYN ) 500 MG tablet Take 1 tablet (500 mg total) by mouth 2 (two) times daily. 04/15/24   Ula Prentice SAUNDERS, MD  ondansetron  (ZOFRAN ) 4 MG tablet Take 4 mg by mouth every 8 (eight) hours as needed for nausea or vomiting.    [provider]  phenazopyridine  (PYRIDIUM ) 100 MG tablet Take 1 tablet (100 mg total) by mouth 2 (two) times daily for Dysuria. Patient not taking:  Reported on 04/09/2024 12/28/23     polyethylene glycol powder (GLYCOLAX /MIRALAX ) 17 GM/SCOOP powder Take 17 g by mouth daily as needed for mild constipation or moderate constipation.    [provider]  sennosides-docusate sodium  (SENOKOT-S) 8.6-50 MG tablet Take 1 tablet by mouth daily.    [provider]  tamsulosin  (FLOMAX ) 0.4 MG CAPS capsule Take 1 capsule (0.4 mg total) by mouth daily. Patient not taking: Reported on 04/09/2024 01/24/24       Allergies: Adhesive [tape], Cherry, Sulfamethoxazole-trimethoprim, Levofloxacin, Morphine, Tizanidine, Gabapentin, Metformin, Sulfa antibiotics, and Sumatriptan    Review of Systems  Constitutional:  Negative for chills and fever.  Gastrointestinal:  Positive for nausea. Negative for vomiting.  Musculoskeletal:  Positive for arthralgias.  All other systems reviewed and are negative.   Updated Vital Signs BP (!) 157/101   Pulse (!) 102   Temp 98 F (36.7 C)   Resp 17   SpO2 99% Comment: Simultaneous filing. User may not have seen previous data.  Physical Exam Vitals and nursing note reviewed.  Constitutional:      General: She is not in acute distress.    Appearance: Normal appearance. She is not ill-appearing.  HENT:     Head: Normocephalic and atraumatic.     Nose: Nose normal.  Eyes:     Conjunctiva/sclera: Conjunctivae normal.  Cardiovascular:     Rate and Rhythm: Normal rate and regular rhythm.  Pulmonary:     Effort: Pulmonary effort is normal. No respiratory distress.  Musculoskeletal:        General: No deformity.     Comments: Point tenderness over the left elbow.  Otherwise good range of motion in the left elbow.  Neurovascularly intact in the left upper extremity.  Left shoulder, left wrist without tenderness palpation.  All compartments in the left forearm are soft.  Multiple superficial lacerations noted to bilateral upper extremity but none are acute and none require repair.  Skin:    Findings: No rash.   Neurological:     Mental Status: She is alert.     (all labs ordered are listed, but only abnormal results are displayed) Labs Reviewed - No data to display  EKG: None  Radiology: No results found.   Procedures   Medications Ordered in the ED  acetaminophen  (TYLENOL ) tablet 1,000 mg (has no administration in time range)  ondansetron  (ZOFRAN -ODT) disintegrating tablet 4 mg (has no administration in time range)    Clinical Course as of 04/16/24 1308  Thu Apr 16, 2024  1219 Ct head without acute intracranial process. Discussion had with patient regarding findings and discharge however she states that she fell directly onto her elbow and she mentioned this when she arrived and is having significant amount of  pain.  She does have good range of motion and is neurovascularly intact however does have point tenderness with superficial abrasions as well as some bruising.  Will obtain x-ray. [AA]    Clinical Course User Index [AA] Rebekah Loge, PA-C                                 Medical Decision Making Amount and/or Complexity of Data Reviewed Radiology: ordered.  Risk OTC drugs. Prescription drug management.   Medical Decision Making / ED Course   This patient presents to the ED for concern of fall, this involves an extensive number of treatment options, and is a complaint that carries with it a high risk of complications and morbidity.  The differential diagnosis includes intracranial injury, fracture, contusion  MDM: 38 year old female presents today for concern of fall that occurred last night.  This morning she woke up with headache, nausea.  Recently spent about 1.5-weeks in the emergency department for psych eval.  Discharged last night.  Had a fall when she got home due to uneven pavement in front of her home. No loss of consciousness. Unsure if she hit her head but states she was also unsure the last time she had head injury. CT head obtained.  No acute intracranial  process. Later reports left elbow pain.  X-ray ordered. Case management made aware about patient's need for DME.  States the commode that was delivered was too small.  Case management will work on this. Elbow x-ray without acute fracture. Patient is appropriate for discharge.  Discussed follow-up with her orthopedist regarding her left leg injury. She is agreeable with discharge and Ortho follow-up. She states prior to discharge she would like to speak with case management.  Case management made aware.  Will place patient pending discharge pending the conversation with case management otherwise she is stable for discharge.   Additional history obtained: -Additional history obtained from chart review -External records from outside source obtained and reviewed including: Chart review including previous notes, labs, imaging, consultation notes   Lab Tests: -I ordered, reviewed, and interpreted labs.   The pertinent results include:   Labs Reviewed - No data to display    EKG  EKG Interpretation Date/Time:    Ventricular Rate:    PR Interval:    QRS Duration:    QT Interval:    QTC Calculation:   R Axis:      Text Interpretation:           Imaging Studies ordered: I ordered imaging studies including CT head, left elbow x-ray I independently visualized and interpreted imaging. I agree with the radiologist interpretation   Medicines ordered and prescription drug management: Meds ordered this encounter  Medications   acetaminophen  (TYLENOL ) tablet 1,000 mg   ondansetron  (ZOFRAN -ODT) disintegrating tablet 4 mg    -I have reviewed the patients home medicines and have made adjustments as needed  Reevaluation: After the interventions noted above, I reevaluated the patient and found that they have :improved  Co morbidities that complicate the patient evaluation  Past Medical History:  Diagnosis Date   Abdominal pain, suprapubic 06/04/2016   Abnormal Pap smear of cervix     Acute cystitis 03/10/2018   Anxiety    Arthritis    C. difficile diarrhea 06/12/2018   Cataract    Chest pain 03/28/2018   Depression    Depression with suicidal ideation 03/09/2018   Diarrhea 06/13/2018  GERD (gastroesophageal reflux disease)    History of kidney stones    Hypomagnesemia 06/13/2018   Kidney stone    Low back pain 07/22/2012   Migraine    Non-suicidal self harm as coping mechanism (HCC) 12/25/2019   Oral thrush 06/12/2018   Ovarian cyst    PCOS (polycystic ovarian syndrome)    PTSD (post-traumatic stress disorder)    Scarlet fever    Sleep apnea    Substance abuse (HCC)    Suicidal ideation 04/12/2018      Dispostion: Discharged in stable condition.  Return precaution discussed.  Patient voices understanding and is in agreement with plan.  Final diagnoses:  Fall, initial encounter  Left elbow pain    ED Discharge Orders     None          Rebekah Loge, PA-C 04/16/24 1404    Long, Fonda MATSU, MD 04/17/24 [phone]

## 2024-04-16 NOTE — Discharge Instructions (Addendum)
 Your CT scan of the head did not show any concerning findings.  Left elbow x-ray did not show any fracture.  Follow-up with your orthopedist regarding your left leg injury.  Return for any emergent symptoms.  Take Tylenol  and ibuprofen  as you need to for pain control.  Abilify , Lexapro , naproxen sent to [facility] pharmacy in [place].  Please pick those up and take as prescribed.  Please follow-up with psychiatry as scheduled

## 2024-04-16 NOTE — ED Notes (Signed)
 Pt refused to leave until she knows if she was getting home health care.

## 2024-04-20 ENCOUNTER — Encounter (HOSPITAL_COMMUNITY): Payer: Self-pay | Admitting: Emergency Medicine

## 2024-04-20 ENCOUNTER — Emergency Department (HOSPITAL_COMMUNITY)
Admission: EM | Admit: 2024-04-20 | Discharge: 2024-04-21 | Disposition: A | Discharge status: Home / Self Care | Attending: Emergency Medicine | Admitting: Emergency Medicine

## 2024-04-20 DIAGNOSIS — X58XXXA Exposure to other specified factors, initial encounter: Secondary | ICD-10-CM | POA: Insufficient documentation

## 2024-04-20 DIAGNOSIS — F419 Anxiety disorder, unspecified: Secondary | ICD-10-CM | POA: Diagnosis present

## 2024-04-20 DIAGNOSIS — Z79899 Other long term (current) drug therapy: Secondary | ICD-10-CM | POA: Insufficient documentation

## 2024-04-20 DIAGNOSIS — F332 Major depressive disorder, recurrent severe without psychotic features: Secondary | ICD-10-CM | POA: Diagnosis present

## 2024-04-20 DIAGNOSIS — F329 Major depressive disorder, single episode, unspecified: Secondary | ICD-10-CM | POA: Diagnosis not present

## 2024-04-20 DIAGNOSIS — R45851 Suicidal ideations: Secondary | ICD-10-CM

## 2024-04-20 DIAGNOSIS — S40812A Abrasion of left upper arm, initial encounter: Secondary | ICD-10-CM | POA: Insufficient documentation

## 2024-04-20 DIAGNOSIS — Z9152 Personal history of nonsuicidal self-harm: Secondary | ICD-10-CM | POA: Diagnosis not present

## 2024-04-20 DIAGNOSIS — F603 Borderline personality disorder: Secondary | ICD-10-CM | POA: Diagnosis present

## 2024-04-20 NOTE — ED Notes (Addendum)
 Patient has belongings in triage cabinet behind nurses station

## 2024-04-20 NOTE — ED Triage Notes (Signed)
 Pt arriving POV for psych eval. Pt reports SI at this time. States she was seen Wednesday, could not get her meds until Saturday night and is now behind on her medications.

## 2024-04-21 DIAGNOSIS — F332 Major depressive disorder, recurrent severe without psychotic features: Secondary | ICD-10-CM

## 2024-04-21 LAB — COMPREHENSIVE METABOLIC PANEL WITH GFR
ALT: 35 U/L (ref 0–44)
AST: 26 U/L (ref 15–41)
Albumin: 4.2 g/dL (ref 3.5–5.0)
Alkaline Phosphatase: 73 U/L (ref 38–126)
Anion gap: 10 (ref 5–15)
BUN: 16 mg/dL (ref 6–20)
CO2: 24 mmol/L (ref 22–32)
Calcium: 8.9 mg/dL (ref 8.9–10.3)
Chloride: 101 mmol/L (ref 98–111)
Creatinine, Ser: 0.85 mg/dL (ref 0.44–1.00)
GFR, Estimated: >60 mL/min (ref >60)
Glucose, Bld: 95 mg/dL (ref 70–99)
Potassium: 3.5 mmol/L (ref 3.5–5.1)
Sodium: 135 mmol/L (ref 135–145)
Total Bilirubin: 0.9 mg/dL (ref 0.0–1.2)
Total Protein: 7.1 g/dL (ref 6.5–8.1)

## 2024-04-21 LAB — CBC
HCT: 40.6 % (ref 36.0–46.0)
Hemoglobin: 13.5 g/dL (ref 12.0–15.0)
MCH: 30.7 pg (ref 26.0–34.0)
MCHC: 33.3 g/dL (ref 30.0–36.0)
MCV: 92.3 fL (ref 80.0–100.0)
Platelets: 275 K/uL (ref 150–400)
RBC: 4.4 MIL/uL (ref 3.87–5.11)
RDW: 12.4 % (ref 11.5–15.5)
WBC: 10.1 K/uL (ref 4.0–10.5)
nRBC: 0 % (ref 0.0–0.2)

## 2024-04-21 LAB — RAPID URINE DRUG SCREEN, HOSP PERFORMED
Amphetamines: NOT DETECTED
Barbiturates: NOT DETECTED
Benzodiazepines: NOT DETECTED
Cocaine: NOT DETECTED
Opiates: NOT DETECTED
Tetrahydrocannabinol: NOT DETECTED

## 2024-04-21 LAB — ETHANOL: Alcohol, Ethyl (B): <15 mg/dL (ref <15)

## 2024-04-21 LAB — HCG, SERUM, QUALITATIVE: Preg, Serum: NEGATIVE

## 2024-04-21 MED ORDER — HYDROXYZINE HCL 25 MG PO TABS: 25.0000 mg | ORAL_TABLET | Freq: Three times a day (TID) | ORAL | 0 refills | Status: AC | PRN

## 2024-04-21 MED ORDER — ACETAMINOPHEN 325 MG PO TABS
650.0000 mg | ORAL_TABLET | Freq: Once | ORAL | Status: AC
  Administered 2024-04-21: 650 mg via ORAL
  Filled 2024-04-21: qty 2

## 2024-04-21 MED ORDER — ARIPIPRAZOLE 5 MG PO TABS: 5.0000 mg | ORAL_TABLET | Freq: Every morning | ORAL | 0 refills | Status: DC

## 2024-04-21 MED ORDER — ESCITALOPRAM OXALATE 10 MG PO TABS: 10.0000 mg | ORAL_TABLET | Freq: Every day | ORAL | 0 refills | Status: AC

## 2024-04-21 MED ORDER — ARIPIPRAZOLE 5 MG PO TABS
5.0000 mg | ORAL_TABLET | Freq: Once | ORAL | Status: AC
  Administered 2024-04-21: 5 mg via ORAL
  Filled 2024-04-21: qty 1

## 2024-04-21 MED ORDER — IBUPROFEN 200 MG PO TABS
600.0000 mg | ORAL_TABLET | Freq: Once | ORAL | Status: AC
  Administered 2024-04-21: 600 mg via ORAL
  Filled 2024-04-21: qty 3

## 2024-04-21 MED ORDER — BACITRACIN ZINC 500 UNIT/GM EX OINT
TOPICAL_OINTMENT | Freq: Two times a day (BID) | CUTANEOUS | Status: DC
  Administered 2024-04-21 (×2): 1 via TOPICAL
  Filled 2024-04-21: qty 0.9

## 2024-04-21 MED ORDER — HYDROXYZINE HCL 25 MG PO TABS
25.0000 mg | ORAL_TABLET | Freq: Once | ORAL | Status: AC
  Administered 2024-04-21: 25 mg via ORAL
  Filled 2024-04-21: qty 1

## 2024-04-21 NOTE — ED Provider Notes (Addendum)
 Emergency Medicine Observation Re-evaluation Note  Rebekah Davis is a 38 y.o. female, seen on rounds today.  Pt initially presented to the ED for complaints of Psychiatric Evaluation and Suicidal Currently, the patient is sleeping. She wakes up easily and is concerned about when she will see psychiatry and get her medications. She has toe pain at this time and no other complaints.   Physical Exam  BP (!) 144/78 (BP Location: Right Wrist)   Pulse 98   Temp 98.2 F (36.8 C) (Oral)   Resp 18   LMP 08/20/2023 (Approximate)   SpO2 100%  Physical Exam General: awake, alert, NAD Cardiac: RRR, no murmurs Lungs: CTAB, no respiratory distress Psych: Suicidal ideations, appropriate mood   ED Course / MDM  EKG:   I have reviewed the labs performed to date as well as medications administered while in observation.    Plan  Patient seen by psychiatry and cleared. Medications were sent and patient provided with outpatient resources. Will be discharged     Gennaro Duwaine CROME, DO 04/21/24 [id number]    Gennaro Duwaine CROME, DO 04/21/24 1215
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
## 2024-04-21 NOTE — ED Notes (Signed)
TTS started at this time

## 2024-04-21 NOTE — Consult Note (Signed)
 [facility] Psychiatric Consult Initial  Patient Name: .Rebekah Davis  MRN: [id number]  DOB: November 28, 1985  Consult Order details:  Orders (From admission, onward)     Start     Ordered   04/21/24 0216  CONSULT TO CALL ACT TEAM       Ordering Provider: Griselda Norris, MD  Provider:  (Not yet assigned)  Question:  Reason for Consult?  Answer:  si   04/21/24 0216             Mode of Visit: In person    Psychiatry Consult Evaluation  Service Date: April 21, 2024 LOS:  LOS: 0 days  Chief Complaint psychiatric evaluation   Primary Psychiatric Diagnoses  Major Depressive Disorder  2.   Deliberate Self Harm  3.   Borderline personality disorder   Assessment  Rebekah Davis is a 38 y.o. female admitted: Presented to the ED on 04/20/2024 10:34 PM for psychiatric evaluation. She carries the psychiatric diagnoses of borderline personality disorder, MDD, PTSD, suicidal ideation, psychosis and anxiety and has a past medical history of HTN, COPD, GERD, polycystic ovary syndrome, achilles tendinitis, morbid obesity, endometriosis and vitamin D  deficiency.    Patient presented to the ED last night requesting a psychiatric evaluation, citing emotional distress and feeling overwhelmed. Psychiatric consult was requested at that time. Upon reassessment this morning, patient reports feeling significantly better, denies any suicidal or homicidal ideation, and is requesting to leave the ED. Patient states that she feels she has enough appropriate resources to help her, since she knows she has barriers that will make it hard for her to get placed in an inpatient psychiatric facility. She meets criteria for outpatient psychiatric hospitalization based on not being a danger to herself or others and not being acutely psychotic.  Current outpatient psychotropic medications include Abilify , Atarax , and Ativan  and historically she has had a beneficial response to these medications. She was compliant with medications  prior to admission as evidenced by patient report. On initial examination, patient is cooperative and pleasant. Please see plan below for detailed recommendations.   Diagnoses:  Active Hospital problems: Principal Problem:   MDD (major depressive disorder), recurrent severe, without psychosis (HCC) Active Problems:   Anxiety   Borderline personality disorder (HCC)    Plan   ## Psychiatric Medication Recommendations:  Continue  --abilify  5mg  PO Q day --lexapro  10mg  PO Q day --hydroxyzine  25mg  PO Q 8 hours PRN anxiety --ativan  1mg  PO Q HS 30 day prescription sent for each of these medications to patient [facility] pharmacy   ## Medical Decision Making Capacity: Not specifically addressed in this encounter   ## Further Work-up:  -- most recent EKG on 04/10/2024 had QtC of 452 -- Pertinent labwork reviewed earlier this admission includes: CBC, CMP, UA, pregnancy, alcohol and UDS     ## Disposition:-- There are no psychiatric contraindications to discharge at this time Discharged home in stable condition with outpatient follow-up recommended. Patient will be followed up by her [facility] on 04/21/2024 at 1630. Patient had 30 day supply medications sent to pharmacy.    ## Behavioral / Environmental: -To minimize splitting of staff, assign one staff person to communicate all information from the team when feasible. or Utilize compassion and acknowledge the patient's experiences while setting clear and realistic expectations for care.                ## Safety and Observation Level:  - Based on my clinical evaluation, I estimate the  patient to be at low risk of self harm in the current setting. - At this time, we recommend  routine. This decision is based on my review of the chart including patient's history and current presentation, interview of the patient, mental status examination, and consideration of suicide risk including evaluating suicidal ideation, plan,  intent, suicidal or self-harm behaviors, risk factors, and protective factors. This judgment is based on our ability to directly address suicide risk, implement suicide prevention strategies, and develop a safety plan while the patient is in the clinical setting. Please contact our team if there is a concern that risk level has changed.   CSSR Risk Category:C-SSRS RISK CATEGORY: High Risk   Suicide Risk Assessment: Patient has following modifiable risk factors for suicide: triggering events, which we are addressing by recommending outpatient psychiatric follow up. Patient has following non-modifiable or demographic risk factors for suicide: history of self harm behavior and psychiatric hospitalization Patient has the following protective factors against suicide: Access to outpatient mental health care, Supportive family, and Supportive friends   Thank you for this consult request. Recommendations have been communicated to the primary team.  We will sign off at this time.   CATHALEEN ADAM, PMHNP       History of Present Illness  Relevant Aspects of Hospital ED Course:  Admitted on 04/20/2024 for psychiatric evaluation. She carries the psychiatric diagnoses of borderline personality disorder, MDD, PTSD, suicidal ideation, psychosis and anxiety and has a past medical history of HTN, COPD, GERD, polycystic ovary syndrome, achilles tendinitis, morbid obesity, endometriosis and vitamin D  deficiency.   Patient Report:  Rebekah Davis, is seen face to face by this provider, consulted with Dr. Larina; and chart reviewed on 04/21/24.  On evaluation Nechuma Boven reports that she came in last night for a psychiatric evaluation, due to feeling suicidal at the time.  She states that she thinks she was feeling this way because she has not had her medications in her system completely.  She goes on to say that she was seen here at the hospital on last Wednesday, when she was discharge her medications were not  sent to her pharmacy as she was not able to get her medications until Saturday night and now she is behind I will take her medications. This morning, the patient is calm, alert, and states "I feel better now, after the night of being here.  I do not want to be in the hospital, if I am going to have to wait for an inpatient facility as long as I did last time, and then be denied."  This provider did discuss with her the barriers of her being placed into an inpatient psychiatric facility, such as the heart cast on her leg, and her decreased mobility of moving around.  Patient states that she did understand, and is ready to go home.  She is states that she was just feeling upset, because she felt that people were not hearing her, or taking her serious.  This provider talked with her about her community support team through [place], and let her know that they will come to her home and see her and assist her in any way that they can.  She states that her appetite and sleep have been fair, states that she is a marijuana stress of not being heard keep her up at night.  Understands that she needs to sleep more and eat more, and allow her moderate pain to help her.  Denies suicidal ideation, homicidal ideation,  or perceptual disturbances. No evidence of psychosis or mania.  Patient denies using any illicit substances or alcohol, UDS is negative and BAL less than 15. Patient gives this provider to speak with her best friend Burnard, and with her [facility] Support Team staff Elouise Husband.   During evaluation Jalynne Persico is sitting on the side of the bed in her hospital gurney with her left leg propped up on a chair and appears to be in no acute distress.  She is alert & oriented x 4, calm, cooperative and attentive for this assessment.  Her mood is good with congruent affect. She has normal speech, and behavior.  Objectively there is no evidence of psychosis/mania or delusional thinking. Pt does not appear to be  responding to internal or external stimuli.  Patient is able to converse coherently, goal directed thoughts, no distractibility, or pre-occupation.  She denies suicidal/self-harm/homicidal ideation, psychosis, and paranoia.     The patient has a history of Personality Disorder with frequent emotional dysregulation, interpersonal conflict, and episodic impulsivity. At presentation, patient expressed vague distress and frustration with relationships but denied specific intent to harm self or others. Overnight observation revealed stable mood, cooperative behavior, and no signs of acute psychiatric decompensation. This morning, the patient is calm, alert, and states she is feeling better" Denies suicidal ideation, homicidal ideation, or perceptual disturbances. No evidence of psychosis or mania. Patient appears stable for discharge with outpatient follow-up.   Psych ROS:  Depression: Denies, says its intermittent upon mood Anxiety:  Denies  Mania (lifetime and current):  Denies Psychosis: (lifetime and current):  Denies  Collateral information:  Jhonny Burnard patients friend [phone], she states that she  has know patient for a few years. Reports that she just saw patient on Sunday and is not sure what necessitated this visit to the ED, says on Sunday they met with the Home Health RN at patients home, and everything went well. Says she spoke with patient yesterday and she had a doctor appointment about her cast on leg, and she thought patient was ok, she reports that when people dont respond to patient, fast enough, or give her what she wants, she becomes emotionally dysregulated. She states that she will come to patients home today and visit, once she is discharged. She does not feel that patient is an acute danger to self or anyone else.   Spoke with Elouise Husband [phone] @ [facility] he confirms that patient is on the team, and that he was supposed to come and visit patient on Thursday, but  had a client recently reschedule and he could come and see her today at 1630 to follow up.   Review of Systems  Psychiatric/Behavioral: Negative.       Psychiatric and Social History  Psychiatric History:  Information collected from patient and chart review   Prev Dx/Sx: borderline personality disorder, MDD, PTSD, suicidal ideation, psychosis and anxiety Current Psych Provider: yes Home Meds (current): lexapro , abilify , ativan  and hydroxyzine  Previous Med Trials: unknown Therapy: not currently   Prior Psych Hospitalization: Yes  Prior Self Harm: yes, cutting  Prior Violence: denies   Family Psych History: Mom - borderline personality disorder Family Hx suicide: denies   Social History:  Developmental Hx: WNL Educational Hx: some college Occupational Hx: disabled Legal Hx: none noted Living Situation: lives in transitional housing Spiritual Hx: none noted Access to weapons/lethal means: denies    Substance History Alcohol: denies   Tobacco: denies Illicit drugs: denies Prescription drug abuse: denies Rehab  hx: denies Exam Findings  Physical Exam:  Vital Signs:  Temp:  [98.1 F (36.7 C)-98.2 F (36.8 C)] 98.2 F (36.8 C) (07/08 1131) Pulse Rate:  [88-104] 94 (07/08 1131) Resp:  [16-18] 16 (07/08 1131) BP: (136-145)/(78-95) 141/79 (07/08 1131) SpO2:  [100 %] 100 % (07/08 1131) Blood pressure (!) 141/79, pulse 94, temperature 98.2 F (36.8 C), temperature source Oral, resp. rate 16, last menstrual period 08/20/2023, SpO2 100%. There is no height or weight on file to calculate BMI.  Physical Exam Psychiatric:        Mood and Affect: Mood normal.        Behavior: Behavior normal.        Thought Content: Thought content normal.        Judgment: Judgment normal.      Mental Status Exam: General Appearance: Casual  Orientation:  Full (Time, Place, and Person)  Memory:  Immediate;   Good Recent;   Good Remote;   Good  Concentration:  Concentration: Good   Recall:  Good  Attention  Good  Eye Contact:  Good  Speech:  Clear and Coherent  Language:  Good  Volume:  Normal  Mood: Euthymic  Affect:  Congruent  Thought Process:  Goal Directed  Thought Content:  Logical  Suicidal Thoughts:  No  Homicidal Thoughts:  No  Judgement: Fair  Insight:  Fair  Psychomotor Activity:  Normal  Akathisia:  No  Fund of Knowledge:  Fair    Assets:  Architect Housing Leisure Time  Cognition:  WNL  ADL's:  Intact  AIMS (if indicated):        Other History   These have been pulled in through the EMR, reviewed, and updated if appropriate.  Family History:  The patient's family history includes Depression in her mother; Diabetes in her father and mother; Heart disease in her paternal grandmother; Hypertension in her father and mother; Kidney disease in her mother; Other in her maternal grandmother; Ovarian cancer in her maternal grandmother; Stroke in her paternal grandfather; Thyroid  disease in her mother.  Medical History: Past Medical History:  Diagnosis Date   Abdominal pain, suprapubic 06/04/2016   Abnormal Pap smear of cervix    Acute cystitis 03/10/2018   Anxiety    Arthritis    C. difficile diarrhea 06/12/2018   Cataract    Chest pain 03/28/2018   Depression    Depression with suicidal ideation 03/09/2018   Diarrhea 06/13/2018   GERD (gastroesophageal reflux disease)    History of kidney stones    Hypomagnesemia 06/13/2018   Kidney stone    Low back pain 07/22/2012   Migraine    Non-suicidal self harm as coping mechanism (HCC) 12/25/2019   Oral thrush 06/12/2018   Ovarian cyst    PCOS (polycystic ovarian syndrome)    PTSD (post-traumatic stress disorder)    Scarlet fever    Sleep apnea    Substance abuse (HCC)    Suicidal ideation 04/12/2018    Surgical History: Past Surgical History:  Procedure Laterality Date   BILATERAL SALPINGOECTOMY     LUMBAR LAMINECTOMY     RIGHT  OOPHERECTOMY Right    TENOLYSIS Left 10/04/2020   Procedure: LEFT ACHILLES TENDON DEBRIDEMENT AND RECONSTRUCTION, CALCANEAL EXOSTECTOMY, GASTROCNEMIUS RECESSION,  FLEXOR HALLUCIS LONGUS TRANSFER;  Surgeon: Elsa Lonni SAUNDERS, MD;  Location: [facility] OR;  Service: Orthopedics;  Laterality: Left;  LENGTH OF SURGERY: 1.5 HOURS   TONSILLECTOMY       Medications:   Current Facility-Administered  Medications:    bacitracin  ointment, , Topical, BID, Griselda Norris, MD, 1 Application at 04/21/24 0902  Current Outpatient Medications:    acetaminophen  (TYLENOL ) 500 MG tablet, Take 1,000 mg by mouth every 8 (eight) hours., Disp: , Rfl:    ARIPiprazole  (ABILIFY ) 5 MG tablet, Take 1 tablet (5 mg total) by mouth at bedtime for mood. (Patient taking differently: Take 5 mg by mouth every morning.), Disp: 14 tablet, Rfl: 0   ergocalciferol  (VITAMIN D2) 1.25 MG (50000 UT) capsule, Take 1 capsule (50,000 Units total) by mouth once a week. (Patient taking differently: Take 50,000 Units by mouth every Friday.), Disp: 4 capsule, Rfl: 2   escitalopram  (LEXAPRO ) 10 MG tablet, Take 1 tablet (10 mg total) by mouth daily. (Patient taking differently: Take 10 mg by mouth at bedtime.), Disp: 30 tablet, Rfl: 0   hydrOXYzine  (ATARAX ) 25 MG tablet, Take 1 tablet (25 mg total) by mouth 3 (three) times daily as needed for anxiety. (Patient taking differently: Take 25 mg by mouth 3 (three) times daily.), Disp: 30 tablet, Rfl: 0   ibuprofen  (ADVIL ) 600 MG tablet, Take 600 mg by mouth 4 (four) times daily., Disp: , Rfl:    levonorgestrel (MIRENA, 52 MG,) 20 MCG/DAY IUD, 1 each by Intrauterine route once., Disp: , Rfl:    LORazepam  (ATIVAN ) 1 MG tablet, Take 1 mg by mouth at bedtime., Disp: , Rfl:    methocarbamol  (ROBAXIN ) 500 MG tablet, Take 500 mg by mouth 2 (two) times daily as needed for muscle spasms., Disp: , Rfl:    ondansetron  (ZOFRAN ) 4 MG tablet, Take 4 mg by mouth every 8 (eight) hours as needed for nausea or vomiting.,  Disp: , Rfl:    polyethylene glycol powder (GLYCOLAX /MIRALAX ) 17 GM/SCOOP powder, Take 17 g by mouth daily as needed for mild constipation or moderate constipation., Disp: , Rfl:    sennosides-docusate sodium  (SENOKOT-S) 8.6-50 MG tablet, Take 1 tablet by mouth daily., Disp: , Rfl:    albuterol  (VENTOLIN  HFA) 108 (90 Base) MCG/ACT inhaler, Inhale 1 puff into the lungs every 4 (four) hours as needed. (Patient not taking: Reported on 04/21/2024), Disp: 18 g, Rfl: 3   cyclobenzaprine  (FLEXERIL ) 5 MG tablet, Take 1 tablet (5 mg total) by mouth at bedtime as needed. (Patient not taking: Reported on 04/09/2024), Disp: 14 tablet, Rfl: 0   lamoTRIgine  (LAMICTAL ) 100 MG tablet, Take 1 tablet (100 mg total) by mouth 2 (two) times daily. (Patient not taking: Reported on 04/09/2024), Disp: 60 tablet, Rfl: 11   LORazepam  (ATIVAN ) 0.5 MG tablet, Take 1-2 tablets (0.5-1 mg total) by mouth every 4-6 hours as needed for severe panic attacks. (Patient not taking: Reported on 04/09/2024), Disp: 10 tablet, Rfl: 2   naproxen  (NAPROSYN ) 500 MG tablet, Take 1 tablet (500 mg total) by mouth 2 (two) times daily. (Patient not taking: Reported on 04/21/2024), Disp: 30 tablet, Rfl: 0   phenazopyridine  (PYRIDIUM ) 100 MG tablet, Take 1 tablet (100 mg total) by mouth 2 (two) times daily for Dysuria. (Patient not taking: Reported on 04/09/2024), Disp: 28 tablet, Rfl: 0   tamsulosin  (FLOMAX ) 0.4 MG CAPS capsule, Take 1 capsule (0.4 mg total) by mouth daily. (Patient not taking: Reported on 04/09/2024), Disp: 30 capsule, Rfl: 0  Allergies: Allergies  Allergen Reactions   Adhesive [Tape] Other (See Comments)    blister   Cherry Anaphylaxis and Rash   Sulfamethoxazole-Trimethoprim Hives   Levofloxacin Other (See Comments)    Muscle Pain   Morphine Other (See Comments)  Feels like unable to breath or swallow    Tizanidine Diarrhea   Gabapentin Rash   Metformin Nausea And Vomiting and Other (See Comments)    Diaphoresis    Sulfa  Antibiotics Hives and Rash   Sumatriptan Diarrhea, Nausea And Vomiting and Other (See Comments)    Decreased heart rate and respiratory rate      Cutler Sunday MOTLEY-MANGRUM, PMHNP

## 2024-04-21 NOTE — ED Notes (Addendum)
 Pt is changed out, wanded by security cast on left foot, velcro splint hand, glasses are prescription, pt belongings in [place] [place] B cabinet labeled, Pt was given sandwich and water

## 2024-04-21 NOTE — ED Notes (Signed)
 Pt has been cleared by Psych. Pt given back belongings and now in restroom getting dressed

## 2024-04-21 NOTE — ED Provider Notes (Signed)
 [place] EMERGENCY DEPARTMENT AT [facility] Provider Note   CSN: [id number] Arrival date & time: 04/20/24  2206     Patient presents with: Psychiatric Evaluation and Suicidal   Rebekah Davis is a 38 y.o. female.   The history is provided by the patient and medical records.   Rebekah Davis is a 38 y.o. female who presents to the Emergency Department complaining of SI.  She presents to the emergency department for evaluation of suicidal ideation with the thought to stop herself.  She was discharged from the hospital on Wednesday following evaluation for suicidal ideation.  She states that when she was discharged she was not provided with prescriptions for her medications.  She was able to reach out to her family doctor and was able to start taking them yesterday.  She feels like her thoughts are spiraling out of control and she does not know how to make a safe plan while she waits for the meds to get back in her system.     Prior to Admission medications   Medication Sig Start Date End Date Taking? Authorizing Provider  acetaminophen  (TYLENOL ) 500 MG tablet Take 1,000 mg by mouth 2 (two) times daily.    [provider]  albuterol  (VENTOLIN  HFA) 108 (90 Base) MCG/ACT inhaler Inhale 1 puff into the lungs every 4 (four) hours as needed. Patient taking differently: Inhale 1 puff into the lungs every 4 (four) hours as needed for shortness of breath or wheezing (seek medical care if SVT(s) result). 02/14/24     ARIPiprazole  (ABILIFY ) 5 MG tablet Take 1 tablet (5 mg total) by mouth at bedtime for mood. Patient taking differently: Take 5 mg by mouth every morning. 12/28/23     ARIPiprazole  (ABILIFY ) 5 MG tablet Take 1 tablet (5 mg total) by mouth daily. 04/15/24   Ula Prentice SAUNDERS, MD  cyclobenzaprine  (FLEXERIL ) 5 MG tablet Take 1 tablet (5 mg total) by mouth at bedtime as needed. Patient not taking: Reported on 04/09/2024 02/17/24     ergocalciferol  (VITAMIN D2) 1.25 MG (50000 UT)  capsule Take 1 capsule (50,000 Units total) by mouth once a week. Patient taking differently: Take 50,000 Units by mouth every Friday. 10/23/23     escitalopram  (LEXAPRO ) 10 MG tablet Take 1 tablet (10 mg total) by mouth daily. 04/15/24   Ula Prentice SAUNDERS, MD  hydrOXYzine  (ATARAX ) 25 MG tablet Take 1 tablet (25 mg total) by mouth 3 (three) times daily as needed for anxiety. Patient taking differently: Take 25 mg by mouth See admin instructions. Take 25 mg by mouth three times a day AND at bedtime as needed for anxiety or sleep 11/21/22   Dasie Ellouise CROME, FNP  lamoTRIgine  (LAMICTAL ) 100 MG tablet Take 1 tablet (100 mg total) by mouth 2 (two) times daily. Patient not taking: Reported on 04/09/2024 11/26/23   Onita Duos, MD  levonorgestrel (MIRENA, 52 MG,) 20 MCG/DAY IUD 1 each by Intrauterine route once.    [provider]  LORazepam  (ATIVAN ) 0.5 MG tablet Take 1-2 tablets (0.5-1 mg total) by mouth every 4-6 hours as needed for severe panic attacks. Patient not taking: Reported on 04/09/2024 02/18/24     LORazepam  (ATIVAN ) 1 MG tablet Take 1 mg by mouth at bedtime.    [provider]  methocarbamol  (ROBAXIN ) 500 MG tablet Take 500 mg by mouth every 8 (eight) hours as needed for muscle spasms. 03/08/24   [provider]  naproxen  (NAPROSYN ) 500 MG tablet Take 1 tablet (500  mg total) by mouth 2 (two) times daily. 04/15/24   Ula Prentice SAUNDERS, MD  ondansetron  (ZOFRAN ) 4 MG tablet Take 4 mg by mouth every 8 (eight) hours as needed for nausea or vomiting.    [provider]  phenazopyridine  (PYRIDIUM ) 100 MG tablet Take 1 tablet (100 mg total) by mouth 2 (two) times daily for Dysuria. Patient not taking: Reported on 04/09/2024 12/28/23     polyethylene glycol powder (GLYCOLAX /MIRALAX ) 17 GM/SCOOP powder Take 17 g by mouth daily as needed for mild constipation or moderate constipation.    [provider]  sennosides-docusate sodium  (SENOKOT-S) 8.6-50 MG tablet Take 1 tablet by mouth  daily.    [provider]  tamsulosin  (FLOMAX ) 0.4 MG CAPS capsule Take 1 capsule (0.4 mg total) by mouth daily. Patient not taking: Reported on 04/09/2024 01/24/24       Allergies: Adhesive [tape], Cherry, Sulfamethoxazole-trimethoprim, Levofloxacin, Morphine, Tizanidine, Gabapentin, Metformin, Sulfa antibiotics, and Sumatriptan    Review of Systems  All other systems reviewed and are negative.   Updated Vital Signs BP (!) 145/87 (BP Location: Right Wrist)   Pulse 88   Temp 98.2 F (36.8 C) (Oral)   Resp 18   LMP 08/20/2023 (Approximate)   SpO2 100%   Physical Exam Vitals and nursing note reviewed.  Constitutional:      Appearance: She is well-developed.  HENT:     Head: Normocephalic and atraumatic.  Cardiovascular:     Rate and Rhythm: Normal rate and regular rhythm.  Pulmonary:     Effort: Pulmonary effort is normal. No respiratory distress.  Musculoskeletal:     Comments: Healing abrasions to the left upper extremity.  There is a cast to the left leg.  Skin:    General: Skin is warm.  Neurological:     Mental Status: She is alert and oriented to person, place, and time.  Psychiatric:     Comments: Flat affect     (all labs ordered are listed, but only abnormal results are displayed) Labs Reviewed  COMPREHENSIVE METABOLIC PANEL WITH GFR  ETHANOL  CBC  HCG, SERUM, QUALITATIVE  RAPID URINE DRUG SCREEN, HOSP PERFORMED    EKG: None  Radiology: No results found.   Procedures   Medications Ordered in the ED  bacitracin  ointment (1 Application Topical Given 04/21/24 0335)  ibuprofen  (ADVIL ) tablet 600 mg (600 mg Oral Given 04/21/24 0335)                                    Medical Decision Making Amount and/or Complexity of Data Reviewed Labs: ordered.  Risk OTC drugs.   Patient presents to the emergency department voluntarily for evaluation of suicidal ideation in the setting of missing her psychiatric medications for several days.  She is  calm in the emergency department, does have SI, but does not want to act on it at this time.  Labs are unremarkable.  She has been medically cleared for psychiatric evaluation and treatment.     Final diagnoses:  Suicidal ideation    ED Discharge Orders     None          Griselda Norris, MD 04/21/24 [phone]

## 2024-04-21 NOTE — Discharge Instructions (Signed)
 Discharge recommendations:  Patient is to take medications as prescribed. Please see information for follow-up appointment with psychiatry and therapy. Please follow up with your primary care provider for all medical related needs.   Therapy: We recommend that patient participate in individual therapy to address mental health concerns.  Medications: The patient or guardian is to contact a medical professional and/or outpatient provider to address any new side effects that develop. The patient or guardian should update outpatient providers of any new medications and/or medication changes.   Atypical antipsychotics: If you are prescribed an atypical antipsychotic, it is recommended that your height, weight, BMI, blood pressure, fasting lipid panel, and fasting blood sugar be monitored by your outpatient providers.  Safety:  The patient should abstain from use of illicit substances/drugs and abuse of any medications. If symptoms worsen or do not continue to improve or if the patient becomes actively suicidal or homicidal then it is recommended that the patient return to the closest hospital emergency department, the [facility], or call 911 for further evaluation and treatment. National Suicide Prevention Lifeline 1-800-SUICIDE or (929)508-2486.  About 988 988 offers 24/7 access to trained crisis counselors who can help people experiencing mental health-related distress. People can call or text 988 or chat 988lifeline.org for themselves or if they are worried about a loved one who may need crisis support.  Crisis Mobile: [facility]:                     (320)604-7845 (for crisis response 24 hours a day) [facility] Hotline:                                            (445) 192-3283    Safety Plan Rebekah Davis will reach out to her [facility] CST staff Rebekah Davis, call 911 or call mobile crisis, or go to nearest emergency room if condition worsens  or if suicidal thoughts become active Patients' will follow up with [facility] for outpatient psychiatric services (therapy/medication management).  The suicide prevention education provided includes the following: Suicide risk factors Suicide prevention and interventions National Suicide Hotline telephone number [facility] assessment telephone number [facility] Emergency Assistance 911 [facility] and/or Residential Mobile Crisis Unit telephone number Request made of family/significant other to:  [facility] CST staff Rebekah Davis (e.g., guns, rifles, knives), all items previously/currently identified as safety concern.   Remove drugs/medications (over the counter, prescriptions, illicit drugs), all items previously/currently identified as a safety concern.

## 2024-04-28 ENCOUNTER — Other Ambulatory Visit: Payer: Self-pay | Admitting: Family Medicine

## 2024-04-28 DIAGNOSIS — R600 Localized edema: Secondary | ICD-10-CM

## 2024-04-29 ENCOUNTER — Inpatient Hospital Stay: Admission: RE | Admit: 2024-04-29 | Source: Ambulatory Visit

## 2024-05-05 ENCOUNTER — Other Ambulatory Visit

## 2024-05-06 ENCOUNTER — Other Ambulatory Visit

## 2024-05-13 ENCOUNTER — Encounter: Payer: Self-pay | Admitting: Physician Assistant

## 2024-05-13 ENCOUNTER — Ambulatory Visit: Admitting: Physician Assistant

## 2024-05-13 DIAGNOSIS — I781 Nevus, non-neoplastic: Secondary | ICD-10-CM

## 2024-05-13 DIAGNOSIS — L905 Scar conditions and fibrosis of skin: Secondary | ICD-10-CM | POA: Diagnosis not present

## 2024-05-13 DIAGNOSIS — L719 Rosacea, unspecified: Secondary | ICD-10-CM | POA: Diagnosis not present

## 2024-05-13 MED ORDER — IVERMECTIN 1 % EX CREA: 1.0000 | TOPICAL_CREAM | Freq: Every day | CUTANEOUS | 1 refills | Status: DC

## 2024-05-13 NOTE — Progress Notes (Signed)
 Meds Ordered in [facility]  Medication Sig Dispense Refill    Pulmonary Follow up Note - [facility]  [address], [place] [address] [phone] F [phone] [address], [place] [address] [phone] F- [phone]   Location Information: Patient State (at time of visit): [place]  Patient Location (at time of visit):Home/Other Non-Medical  Provider Location: Non-Provider-Based Clinic (Clinic, non-hospital) Is provider licensed to provide clinical care in the current location/state of the patient? Yes   Consent:  Patient's identity was confirmed. Presenting condition or illness was discussed with the patient/personal representative. Current proposed treatment for presenting condition or illness was explained to patient/personal representative along with the likely benefits and any significant risks or complications associated with the provision of treatment by audio/video means. The patient/personal representative verbally authorized treatment to be provided by audio/video, which may include a limited review of patient's current health status, medication, or other treatment recommendations, patient education, and an opportunity to ask questions about condition and treatment. Verbal Consent Granted by Patient/Personal Representative:Yes   Visit Information: Modality: Audio-Only  Time Spent on Phone w/ Patient: 42   Pulmonary Medicine Outpatient Return Visit    Assessment/Plan:   1. OSA (obstructive sleep apnea)  Adult Home Sleep Apnea Test Order (Sleep Provider Only)      WatchPAT home sleep study 12/20/2023 noted AHI 33.9.  She had auto titration CPAP 5 to 15 cm/H2O ordered for her 01/06/2024.  She started the auto titration CPAP but was hospitalized numerous times and was unable to document full compliance and the CPAP machine was picked up by the durable medical equipment company due to noncompliance  usage I am placing an updated order for home sleep study to rediagnose obstructive sleep apnea and get her back on CPAP machine  Return to clinic after sleep study has been conducted  Plan was discussed with Dr. Nathanael as my supervising physician  Prentice BROCKS. Dallis RIGGERS [facility] 05/13/2024 3:06 PM   Interval History:    History of Present Illness:   Jozlyn Schatz has past medical history of hypertension, GERD, PCOS, COPD, C. difficile diarrhea, obstructive sleep apnea, herniated lumbar intervertebral disc with lumbar radiculitis and sciatica of left side, Achilles tendinitis, pelvic pain, allodynia, PTSD, suicidal ideations, severe recurrent major depressive disorder, anxiety, borderline personality disorder, and obesity.  She has medication allergy to sulfa-based medication, gabapentin, levofloxacin, metformin, morphine tizanidine and sumatriptan.  She is a former smoker.  She started smoking around age 9 (2008).  She stopped smoking in 2020.   Telehealth telephone encounter with [name].  Ms. Dobler is in the state of [place]  at the time of this telephone encounter. HST PST sleep study conducted 12/20/2023 noted an AHI of 33.9 indicating moderate obstructive sleep apnea.  An auto titration CPAP 5 to 15 cm/H2O was ordered 01/06/2024.  Lailany started use of the CPAP machine but then became hospitalized numerous times and was unable to adequately document compliance usage for the durable medical equipment company.  The medical equipment company has picked up her CPAP machine.  Anastasia is interested in continuing treatment for the recently diagnosed obstructive sleep apnea and has been told she needs an updated sleep study.  I am placing an order for home sleep study.  I will see her after the report has posted to let her know of the results and treatment plan.  Previous Report Reviewed: lab reports and office notes   The following portions of  the patient's history were  reviewed and updated as appropriate: allergies, current medications, past family history, past medical history, past social history, past surgical history, and problem list.   Tobacco status:  reports that she quit smoking about 5 years ago. Her smoking use included cigarettes. She started smoking about 17 years ago. She has a 12.3 pack-year smoking history. She has never used smokeless tobacco.  Review of Systems A comprehensive review of systems was negative.   Past History:   Active Ambulatory Problems    Diagnosis Date Noted  . Ovarian cyst 02/19/2020  . Irregular bleeding 04/12/2020  . BMI 50.0-59.9, adult (HCC) 04/24/2020  . Abdominal pain, suprapubic 06/04/2016  . Acute cystitis 03/10/2018  . Anxiety 12/24/2012  . C. difficile diarrhea 06/12/2018  . Borderline personality disorder    (CMD) 12/29/2012  . Cellulitis of breast 03/22/2018  . Chest pain 03/28/2018  . COPD (chronic obstructive pulmonary disease)    (CMD) 12/24/2012  . Major depressive disorder 12/24/2012  . Diarrhea 06/13/2018  . Essential hypertension 12/24/2012  . GERD (gastroesophageal reflux disease) 03/10/2018  . Herniated lumbar intervertebral disc 02/28/2013  . Hypomagnesemia 06/13/2018  . Left Achilles tendinitis 03/11/2020  . Non-specific low back pain 07/22/2012  . Lumbar radiculitis 12/13/2016  . Meralgia paresthetica, bilateral lower limbs 02/07/2017  . Multiple lacerations 12/24/2012  . Myofascial pain 02/07/2017  . Non-suicidal self harm as coping mechanism    (CMD) 12/25/2019  . Oral thrush 06/12/2018  . OSA (obstructive sleep apnea) 03/28/2018  . Pelvic pain in female 06/04/2016  . Polycystic disease, ovaries 07/05/2016  . Generalized anxiety disorder 12/09/2015  . PTSD (post-traumatic stress disorder) 03/09/2018  . Sciatica of left side 02/28/2013  . Severe episode of recurrent major depressive disorder, without psychotic features    (CMD) 02/27/2018  . Spondylolisthesis 04/20/2015  .  Suicidal ideations 04/12/2018  . Tachycardia 12/18/2016  . Tobacco user 04/04/2018  . Transaminitis 06/13/2018  . Ovarian cyst 02/19/2020  . High-tone pelvic floor dysfunction 06/03/2023  . Pain of left hip 06/03/2023  . Allodynia 06/03/2023  . Combined abdominal and pelvic pain 06/03/2023  . Endometriosis 09/06/2023  . Incomplete defecation 01/24/2024  . SVT (supraventricular tachycardia) (CMD) 01/27/2024  . 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 No Resolved Ambulatory Problems   Past Medical History:  Diagnosis Date  . Degenerative disc disease, lumbar   . Dissociative identity disorder    (CMD)   . Eye pressure   . Migraines   . Osteoarthritis   . PCOS (polycystic ovarian syndrome)   . Sleep apnea   . Sleep apnea, obstructive   . Somatization disorder   . Tendonitis    Family History[1] Social History Social History[2] Social History   Social History Narrative  . Not on file     Medications:   Current Rx ordered in [facility][3]    Physical Exam:  There was no physical exam as this was a telephone encounter    Risks, benefits, and alternatives of the medication(s) and treatment plan(s) were discussed, and he expressed understanding. Plan follow up as discussed or as needed. No barriers to treatment identified in this visit.    Thank you for allowing me to participate in the care of your patient. Please do not hesitate to contact my office any time if we can be of further assistance          [1] Family History  Problem Relation Name Age of Onset  . Diabetes Mother    . Hypertension Mother    . Hyperlipidemia Mother    . Arthritis Mother    . Hypertension Father    . Diabetes Father    . Arthritis Father    . Ovarian cancer Other         grandmother  [2] Social History Tobacco Use  . Smoking status: Former    Current packs/day: 0.00    Average packs/day: 1  pack/day for 12.3 years (12.3 ttl pk-yrs)    Types: Cigarettes    Start date: 2008    Quit date: 02/19/2019    Years since quitting: 5.2  . Smokeless tobacco: Never  Vaping Use  . Vaping status: Never Used  Substance Use Topics  . Alcohol use: Never  . Drug use: Not Currently  [3] Meds Ordered in [facility]  Medication Sig Dispense Refill  . acetaminophen  (TYLENOL ) 325 mg tablet Take 650 mg by mouth every 8 (eight) hours as needed.    . albuterol  HFA (PROVENTIL  HFA;VENTOLIN  HFA;PROAIR  HFA) 90 mcg/actuation inhaler Inhale 2 puffs every 4 (four) hours as needed.    . ARIPiprazole  (ABILIFY ) 5 mg tablet Take 5 mg by mouth.    . ergocalciferol  (VITAMIN D2) 1,250 mcg (50,000 unit) capsule Take 1 capsule by mouth once a week.    . hydrOXYzine  (ATARAX ) 25 mg tablet TAKE 1 TABLET BY MOUTH EVERY NIGHT AT BEDTIME AS NEEDED FOR ANXIETY    . ibuprofen  (MOTRIN ) 200 mg tablet Take 400 mg by mouth 2 (two) times a day as needed. On hold 11/15 for surgery 09/05/23    . levonorgestreL (MIRENA) 21 mcg/24hr (up to 8 yrs) 52 mg IUD 1 each by intrauterine route once.    . LORazepam  (ATIVAN ) 0.5 mg tablet Take 0.25-0.5 mg by mouth as needed.    . polyethylene glycol (MIRALAX ) 17 gram powd powder Take 17 g by mouth daily.     No current Epic-ordered facility-administered medications on file.

## 2024-05-13 NOTE — Telephone Encounter (Signed)
 She was turned into a phone visit.

## 2024-05-13 NOTE — Patient Instructions (Addendum)

## 2024-05-13 NOTE — Progress Notes (Signed)
   New Patient Visit   Subjective  Rebekah Davis is a 38 y.o. female who presents for the following: Rash. Patient told it could be skin lupus or rosacea.  Other concern: scars (self harm) on left forearm.   Rash on face. She has had it for as long as she can remember. Not using any topical creams. Only using dove soap on face. No moisturizer. Is itchy most of the time but denies burning sensation   The following portions of the chart were reviewed this encounter and updated as appropriate: medications, allergies, medical history  Review of Systems:  No other skin or systemic complaints except as noted in HPI or Assessment and Plan.  Objective  Well appearing patient in no apparent distress; mood and affect are within normal limits.  A focused examination was performed of the following areas: Face   Relevant exam findings are noted in the Assessment and Plan.    Assessment & Plan   ROSACEA   SCAR CONDITIONS AND FIBROSIS OF SKIN    ROSACEA Exam Mid face erythema with telangiectasias +/- scattered inflammatory papules  flared  Rosacea is a chronic progressive skin condition usually affecting the face of adults, causing redness and/or acne bumps. It is treatable but not curable. It sometimes affects the eyes (ocular rosacea) as well. It may respond to topical and/or systemic medication and can flare with stress, sun exposure, alcohol, exercise, topical steroids (including hydrocortisone/cortisone 10) and some foods.  Daily application of broad spectrum spf 30+ sunscreen to face is recommended to reduce flares.  Patient denies grittiness of the eyes  Treatment Plan Ivermectin  topical cream to use once a day in the morning. Apply topically to affected areas.  Metro gel 1% to prescribe if Ivermectin  is not covered by insurance.  Try to avoid hot water, spicy foods, red wine, sunlight-all of these are triggers.   SCAR Exam: Dyspigmented smooth linear plaques.    Benign-appearing.  Observation.  Call clinic for new or changing lesions. Recommend daily broad spectrum sunscreen SPF 30+, reapply every 2 hours as needed. Treatment: Recommend Serica moisturizing scar formula cream every night or [facility] brand or Mederma silicone scar sheet every night for the first year after a scar appears to help with scar remodeling if desired. Scars remodel on their own for a full year and will gradually improve in appearance over time.   Return if symptoms worsen or fail to improve.  I, Gordan Beams, CMA, am acting as scribe for Shanaiya Bene K, PA-C.   Documentation: I have reviewed the above documentation for accuracy and completeness, and I agree with the above.  Rebekah Huxford K, PA-C

## 2024-05-25 ENCOUNTER — Ambulatory Visit: Payer: Medicare Other | Admitting: Adult Health

## 2024-06-02 NOTE — Progress Notes (Signed)
 Meds Ordered in [facility]  Medication Sig Dispense Refill    Pulmonary Follow up Note - [facility]  [address], [place] [address] [phone] F [phone] [address], [address], [place] [address] [phone]     Pulmonary Medicine Outpatient Return Visit    Assessment/Plan:   1. OSA (obstructive sleep apnea)  DME Positive Airway Pressure      WatchPAT home sleep study 05/14/2024 has an AHI 21.4 indicating moderate obstructive sleep apnea.  I am placing an order for auto titration CPAP 5 to 15 cm/H2O with new mask, tubing, hoses and accessories for CPAP  Spirometry shows ratio 79% FEV1 94% FVC 99% (in preparation for surgical clearance)  Return to clinic 3 months   Plan was discussed with Dr. Nathanael as my supervising physician  Prentice BROCKS. Dallis RIGGERS [facility] 06/02/2024 11:24 AM  I have personally spent 30 minutes involved in face-to-face and non-face-to-face activities for this patient on the day of the visit.  Professional time spent includes the following activities, in addition to those noted in the documentation: Chart review, care coordination and discussion of treatment/management plan   Interval History:    History of Present Illness:   Rebekah Davis has past medical history of hypertension, GERD, PCOS, COPD, C. difficile diarrhea, obstructive sleep apnea, herniated lumbar intervertebral disc with lumbar radiculitis and sciatica of left side, Achilles tendinitis, pelvic pain, allodynia, PTSD, suicidal ideations, severe recurrent major depressive disorder, anxiety, borderline personality disorder, and obesity. She has medication allergy to sulfa-based medication, gabapentin, levofloxacin, metformin, morphine tizanidine and sumatriptan. She is a former smoker. She started smoking around age 46 (2008). She stopped smoking in 2020.   WatchPAT home sleep study sleep study conducted  05/14/2024 noted an AHI of 21.4 indicating moderate obstructive sleep apnea.  I am placing an order for an auto titration CPAP 5 to 15 cm/H2O.  I point out will take several weeks from time of placing order before they are expected to be notified by the durable medical equipment company to set up the initial tutorial on how to use, troubleshoot problems and maintain the equipment. Once they are set up with the CPAP machine I have asked that they have close contact with the durable medical equipment company to correct any problems that could hinder their long-term use of the machine or equipment.  I emphasized the importance of good fit and comfort of the mask as well as making sure that there are no leaks within the system.  We will get together again in 12 weeks to follow-up on his obstructive sleep apnea   Previous Report Reviewed: lab reports and office notes   The following portions of the patient's history were reviewed and updated as appropriate: allergies, current medications, past family history, past medical history, past social history, past surgical history, and problem list.   Tobacco status:  reports that she quit smoking about 5 years ago. Her smoking use included cigarettes. She started smoking about 17 years ago. She has a 12.3 pack-year smoking history. She has never used smokeless tobacco.  Review of Systems A comprehensive review of systems was negative.   Past History:   Active Ambulatory Problems    Diagnosis Date Noted  . Ovarian cyst 02/19/2020  . Irregular bleeding 04/12/2020  . BMI 50.0-59.9, adult (HCC) 04/24/2020  . Abdominal pain, suprapubic 06/04/2016  . Acute cystitis 03/10/2018  . Anxiety 12/24/2012  . C. difficile diarrhea 06/12/2018  .  Borderline personality disorder    (CMD) 12/29/2012  . Cellulitis of breast 03/22/2018  . Chest pain 03/28/2018  . COPD (chronic obstructive pulmonary disease)    (CMD) 12/24/2012  . Major depressive disorder 12/24/2012  .  Diarrhea 06/13/2018  . Essential hypertension 12/24/2012  . GERD (gastroesophageal reflux disease) 03/10/2018  . Herniated lumbar intervertebral disc 02/28/2013  . Hypomagnesemia 06/13/2018  . Left Achilles tendinitis 03/11/2020  . Non-specific low back pain 07/22/2012  . Lumbar radiculitis 12/13/2016  . Meralgia paresthetica, bilateral lower limbs 02/07/2017  . Multiple lacerations 12/24/2012  . Myofascial pain 02/07/2017  . Non-suicidal self harm as coping mechanism    (CMD) 12/25/2019  . Oral thrush 06/12/2018  . OSA (obstructive sleep apnea) 03/28/2018  . Pelvic pain in female 06/04/2016  . Polycystic disease, ovaries 07/05/2016  . Generalized anxiety disorder 12/09/2015  . PTSD (post-traumatic stress disorder) 03/09/2018  . Sciatica of left side 02/28/2013  . Severe episode of recurrent major depressive disorder, without psychotic features    (CMD) 02/27/2018  . Spondylolisthesis 04/20/2015  . Suicidal ideations 04/12/2018  . Tachycardia 12/18/2016  . Tobacco user 04/04/2018  . Transaminitis 06/13/2018  . Ovarian cyst 02/19/2020  . High-tone pelvic floor dysfunction 06/03/2023  . Pain of left hip 06/03/2023  . Allodynia 06/03/2023  . Combined abdominal and pelvic pain 06/03/2023  . Endometriosis 09/06/2023  . Incomplete defecation 01/24/2024  . SVT (supraventricular tachycardia) (CMD) 01/27/2024  . 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 No Resolved Ambulatory Problems   Past Medical History:  Diagnosis Date  . Degenerative disc disease, lumbar   . Dissociative identity disorder    (CMD)   . Eye pressure   . Migraines   . Osteoarthritis   . PCOS (polycystic ovarian syndrome)   . Sleep apnea   . Sleep apnea, obstructive   . Somatization disorder   . Tendonitis    Family History[1] Social History Social History[2] Social History   Social History Narrative  . Not on file      Medications:   Current Rx ordered in [facility][3]    Physical Exam:   Vitals:   06/02/24 1054  BP: 126/76  BP Location: Left arm  Patient Position: Sitting  Pulse: 106  Resp: 18  Temp: 97.3 F (36.3 C)  TempSrc: Temporal  SpO2: 98%  Weight: (!) 149 kg (328 lb)  Height: 1.651 m (5' 5)   General appearance: alert, appears stated age, and cooperative BMI 54.58 HEEN: Normocephalic, without obvious abnormality, atraumatic PERRLA EOMI No ocular lesions Mallampatti 3 Throat: lips, mucosa, and tongue normal; teeth and gums normal  Neck: no adenopathy, no carotid bruit, no JVD, supple, symmetrical, trachea midline, and thyroid  not enlarged, symmetric, no tenderness/mass/nodules Lungs: Auscultation: clear to auscultation bilaterally. Palpation of chest wall reveals normal fremitus of the thorax. Percussion: Normal no cough Heart: regular rate and rhythm, S1, S2 normal, no murmur, click, rub or gallop Peripheral lymphatic : no edema, no cyanosis no clubbing Vascular Pulses: 2+ and symmetric, no claudication Skin: Skin color, texture, turgor normal. No rashes or lesions Neurologic: Grossly normal with intact sensory , muscular exam.     Risks, benefits, and alternatives of the medication(s) and treatment plan(s) were discussed, and he expressed understanding. Plan follow up as discussed or as needed. No barriers to treatment identified in this visit.    Thank you for allowing me to participate in the care of  your patient. Please do not hesitate to contact my office any time if we can be of further assistance          [1] Family History Problem Relation Name Age of Onset  . Diabetes Mother    . Hypertension Mother    . Hyperlipidemia Mother    . Arthritis Mother    . Hypertension Father    . Diabetes Father    . Arthritis Father    . Ovarian cancer Other         grandmother  [2] Social History Tobacco Use  . Smoking status: Former    Current packs/day: 0.00     Average packs/day: 1 pack/day for 12.3 years (12.3 ttl pk-yrs)    Types: Cigarettes    Start date: 2008    Quit date: 02/19/2019    Years since quitting: 5.2  . Smokeless tobacco: Never  Vaping Use  . Vaping status: Never Used  Substance Use Topics  . Alcohol use: Never  . Drug use: Not Currently  [3] Meds Ordered in [facility]  Medication Sig Dispense Refill  . acetaminophen  (TYLENOL ) 325 mg tablet Take 650 mg by mouth every 8 (eight) hours as needed.    . albuterol  HFA (PROVENTIL  HFA;VENTOLIN  HFA;PROAIR  HFA) 90 mcg/actuation inhaler Inhale 2 puffs every 4 (four) hours as needed.    . ARIPiprazole  (ABILIFY ) 5 mg tablet Take 5 mg by mouth.    . ergocalciferol  (VITAMIN D2) 1,250 mcg (50,000 unit) capsule Take 1 capsule by mouth once a week.    . hydrOXYzine  (ATARAX ) 25 mg tablet     . ibuprofen  (MOTRIN ) 200 mg tablet Take 400 mg by mouth as needed in the morning and 400 mg as needed in the evening. On hold 11/15 for surgery 09/05/23.    [name] levonorgestreL (MIRENA) 21 mcg/24hr (up to 8 yrs) 52 mg IUD 1 each by intrauterine route once.    . LORazepam  (ATIVAN ) 0.5 mg tablet Take 0.25-0.5 mg by mouth as needed.    . polyethylene glycol (MIRALAX ) 17 gram powd powder Take 17 g by mouth daily.     No current Epic-ordered facility-administered medications on file.

## 2024-06-02 NOTE — Telephone Encounter (Signed)
 Please pull order.  Sharlet Rosana LATHER

## 2024-06-03 ENCOUNTER — Ambulatory Visit (HOSPITAL_COMMUNITY): Admission: EM | Admit: 2024-06-03 | Discharge: 2024-06-04 | Discharge status: Against Medical Advice

## 2024-06-03 DIAGNOSIS — F4312 Post-traumatic stress disorder, chronic: Secondary | ICD-10-CM | POA: Diagnosis not present

## 2024-06-03 DIAGNOSIS — Z8659 Personal history of other mental and behavioral disorders: Secondary | ICD-10-CM | POA: Diagnosis not present

## 2024-06-03 DIAGNOSIS — F411 Generalized anxiety disorder: Secondary | ICD-10-CM | POA: Insufficient documentation

## 2024-06-03 DIAGNOSIS — F331 Major depressive disorder, recurrent, moderate: Secondary | ICD-10-CM | POA: Insufficient documentation

## 2024-06-03 DIAGNOSIS — F339 Major depressive disorder, recurrent, unspecified: Secondary | ICD-10-CM | POA: Diagnosis present

## 2024-06-03 DIAGNOSIS — F603 Borderline personality disorder: Secondary | ICD-10-CM | POA: Insufficient documentation

## 2024-06-03 LAB — COMPREHENSIVE METABOLIC PANEL WITH GFR
ALT: 29 U/L (ref 0–44)
AST: 22 U/L (ref 15–41)
Albumin: 3.8 g/dL (ref 3.5–5.0)
Alkaline Phosphatase: 86 U/L (ref 38–126)
Anion gap: 10 (ref 5–15)
BUN: 18 mg/dL (ref 6–20)
CO2: 26 mmol/L (ref 22–32)
Calcium: 9.1 mg/dL (ref 8.9–10.3)
Chloride: 103 mmol/L (ref 98–111)
Creatinine, Ser: 0.78 mg/dL (ref 0.44–1.00)
GFR, Estimated: >60 mL/min (ref >60)
Glucose, Bld: 80 mg/dL (ref 70–99)
Potassium: 4.1 mmol/L (ref 3.5–5.1)
Sodium: 139 mmol/L (ref 135–145)
Total Bilirubin: 0.5 mg/dL (ref 0.0–1.2)
Total Protein: 6.7 g/dL (ref 6.5–8.1)

## 2024-06-03 LAB — CBC WITH DIFFERENTIAL/PLATELET
Abs Immature Granulocytes: 0.09 K/uL — ABNORMAL HIGH (ref 0.00–0.07)
Basophils Absolute: 0.1 K/uL (ref 0.0–0.1)
Basophils Relative: 1 %
Eosinophils Absolute: 0.1 K/uL (ref 0.0–0.5)
Eosinophils Relative: 1 %
HCT: 44.3 % (ref 36.0–46.0)
Hemoglobin: 15.1 g/dL — ABNORMAL HIGH (ref 12.0–15.0)
Immature Granulocytes: 1 %
Lymphocytes Relative: 21 %
Lymphs Abs: 3 K/uL (ref 0.7–4.0)
MCH: 31.1 pg (ref 26.0–34.0)
MCHC: 34.1 g/dL (ref 30.0–36.0)
MCV: 91.2 fL (ref 80.0–100.0)
Monocytes Absolute: 0.7 K/uL (ref 0.1–1.0)
Monocytes Relative: 5 %
Neutro Abs: 10.1 K/uL — ABNORMAL HIGH (ref 1.7–7.7)
Neutrophils Relative %: 71 %
Platelets: 308 K/uL (ref 150–400)
RBC: 4.86 MIL/uL (ref 3.87–5.11)
RDW: 12.5 % (ref 11.5–15.5)
WBC: 14.1 K/uL — ABNORMAL HIGH (ref 4.0–10.5)
nRBC: 0 % (ref 0.0–0.2)

## 2024-06-03 LAB — POCT URINE DRUG SCREEN - MANUAL ENTRY (I-SCREEN)
POC Amphetamine UR: NOT DETECTED
POC Buprenorphine (BUP): NOT DETECTED
POC Cocaine UR: NOT DETECTED
POC Marijuana UR: NOT DETECTED
POC Methadone UR: NOT DETECTED
POC Methamphetamine UR: NOT DETECTED
POC Morphine: NOT DETECTED
POC Oxazepam (BZO): NOT DETECTED
POC Oxycodone UR: NOT DETECTED
POC Secobarbital (BAR): NOT DETECTED

## 2024-06-03 LAB — POC URINE PREG, ED: Preg Test, Ur: NEGATIVE

## 2024-06-03 LAB — ETHANOL: Alcohol, Ethyl (B): <15 mg/dL (ref <15)

## 2024-06-03 MED ORDER — ESCITALOPRAM OXALATE 10 MG PO TABS
10.0000 mg | ORAL_TABLET | Freq: Every day | ORAL | Status: DC
  Filled 2024-06-03: qty 1

## 2024-06-03 MED ORDER — ACETAMINOPHEN 325 MG PO TABS: 650.0000 mg | ORAL_TABLET | Freq: Four times a day (QID) | ORAL | Status: DC | PRN

## 2024-06-03 MED ORDER — DIPHENHYDRAMINE HCL 50 MG PO CAPS: 50.0000 mg | ORAL_CAPSULE | Freq: Three times a day (TID) | ORAL | Status: DC | PRN

## 2024-06-03 MED ORDER — LORAZEPAM 2 MG/ML IJ SOLN: 2.0000 mg | Freq: Three times a day (TID) | INTRAMUSCULAR | Status: DC | PRN

## 2024-06-03 MED ORDER — LORAZEPAM 0.5 MG PO TABS
0.5000 mg | ORAL_TABLET | Freq: Every day | ORAL | Status: DC
  Filled 2024-06-03: qty 1

## 2024-06-03 MED ORDER — HALOPERIDOL LACTATE 5 MG/ML IJ SOLN: 5.0000 mg | Freq: Three times a day (TID) | INTRAMUSCULAR | Status: DC | PRN

## 2024-06-03 MED ORDER — ARIPIPRAZOLE 5 MG PO TABS: 5.0000 mg | ORAL_TABLET | Freq: Every day | ORAL | Status: DC

## 2024-06-03 MED ORDER — ACETAMINOPHEN 325 MG PO TABS
650.0000 mg | ORAL_TABLET | Freq: Four times a day (QID) | ORAL | Status: DC | PRN
  Administered 2024-06-03: 650 mg via ORAL
  Filled 2024-06-03: qty 2

## 2024-06-03 MED ORDER — METHOCARBAMOL 500 MG PO TABS
500.0000 mg | ORAL_TABLET | Freq: Two times a day (BID) | ORAL | Status: DC | PRN
Start: 2024-06-03 — End: 2024-06-04
  Filled 2024-06-03: qty 1

## 2024-06-03 MED ORDER — MAGNESIUM HYDROXIDE 400 MG/5ML PO SUSP: 30.0000 mL | Freq: Every day | ORAL | Status: DC | PRN

## 2024-06-03 MED ORDER — DIPHENHYDRAMINE HCL 50 MG/ML IJ SOLN: 50.0000 mg | Freq: Three times a day (TID) | INTRAMUSCULAR | Status: DC | PRN

## 2024-06-03 MED ORDER — HALOPERIDOL LACTATE 5 MG/ML IJ SOLN: 10.0000 mg | Freq: Three times a day (TID) | INTRAMUSCULAR | Status: DC | PRN

## 2024-06-03 MED ORDER — ACETAMINOPHEN 500 MG PO TABS
1000.0000 mg | ORAL_TABLET | Freq: Three times a day (TID) | ORAL | Status: DC | PRN
  Filled 2024-06-03: qty 2

## 2024-06-03 MED ORDER — HALOPERIDOL 5 MG PO TABS: 5.0000 mg | ORAL_TABLET | Freq: Three times a day (TID) | ORAL | Status: DC | PRN

## 2024-06-03 MED ORDER — ALUM & MAG HYDROXIDE-SIMETH 200-200-20 MG/5ML PO SUSP: 30.0000 mL | ORAL | Status: DC | PRN

## 2024-06-03 NOTE — ED Provider Notes (Cosign Needed Addendum)
 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 Patient seen face to face by this provider, consulted with Dr. Zouev; and chart reviewed on 06/03/24. Patient reports she has been having periods of losing time. 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 She reports concern for how her alters work. She describes losing time as difficulty remembering what occurred during time periods and feeling frozen. She reports this has been occurring more frequently over the last couple of weeks. She reports she attended an appointment with her psychiatrist yesterday and was confused once informed that it was Tuesday, as patient believed it was Friday. Patient reports she does not remember this visit. Patient reports his has been occurring since she was a child, but she has noticed an exacerbation since November 2024 after a gynecological surgery. Patient reports when she loses time she reverts to a child mentally and experiences flashbacks of past traumas. Patient reports an increase in the the length of these episodes as they now can last for days, patient states I want to know how to not dissociate. Patient reports an increase in anxiety recently, rates it a 8 on a scale of 0-10 with 10 being the worst anxiety. She rates her baseline anxiety a 5-6. Reports her depression is fairly managed on her current  medication regimen, but continues to report depressed mood, hopelessness, sleep difficulties, Davis energy, and passive suicidal ideation. She reports adherence with current medication regimen: aripiprazole  5 mg daily, escitalopram  10 mg daily, hydroxyzine  25 mg TID prn for anxiety and lorazepam  1 mg nightly at bedtime prn for sleep/anxiety. Reports she takes the lorazepam  4-5 nights per week due to poor sleep related to nightmares and anxiety. She reports passive suicidal ideation related to hopelessness and concern that her mental health will not improve. She denies a plan and denies previous suicide attempts. She endorses a history of NSSI via cutting to relieve emotional pain, last instance was June 2025. Of note, patient has multiple healed scars noted to L forearm. She reports she is currently established with a psychiatrist, Allean Novak at [facility] and a community support team, but their services will be discontinued in 2 weeks due to insurance coverage. She reports she saw a trauma therapist for 4 sessions, last session was last month. She reports cessation due to financial concerns. She denies current substance use, stating last use of cannabis and nicotine was in 2020. She reports she lives independently and unable to state support system. Reports she is currently unemployed, but has applied to multiple jobs. She denies access to firearms.   During evaluation, Seylah Wernert is sitting in no acute distress. She is alert/oriented x 4; calm/cooperative; and mood congruent with affect. She is speaking in a clear tone at moderate volume, and normal pace; with good eye contact. Her thought process is coherent and relevant; There is no indication that she is currently responding to internal/external stimuli or experiencing delusional thought content; and she has denied homicidal  ideation, psychosis, and paranoia. Patient has remained calm throughout assessment and has answered questions appropriately.      Total Time spent with patient: 1 hour  Musculoskeletal  Strength & Muscle Tone: Decreased  Gait & Station: Unsteady due to boot on L foot  Patient leans: N/A  Psychiatric Specialty Exam  Presentation General Appearance:  Appropriate for Environment  Eye Contact: Good  Speech: Clear and Coherent  Speech Volume: Normal  Handedness: Right   Mood and Affect  Mood: Anxious  Affect: Appropriate; Congruent   Thought Process  Thought Processes: Coherent  Descriptions of Associations:Intact  Orientation:Full (Time, Place and Person)  Thought Content:WDL  Diagnosis of Schizophrenia or Schizoaffective disorder in past: No   Hallucinations:Hallucinations: None  Ideas of Reference:None  Suicidal Thoughts:Suicidal Thoughts: Yes, Active SI Active Intent and/or Plan: With Plan (overdose on medication)  Homicidal Thoughts:Homicidal Thoughts: No   Sensorium  Memory: Immediate Poor  Judgment: Fair  Insight: Fair   Chartered certified accountant: Fair  Attention Span: Fair  Recall: Fair  Fund of Knowledge: Fair  Language: Fair   Psychomotor Activity  Psychomotor Activity:Psychomotor Activity: Normal   Assets  Assets: Manufacturing systems engineer; Desire for Improvement   Sleep  Sleep:Sleep: Poor Number of Hours of Sleep: 4   Nutritional Assessment (For OBS and FBC admissions only) Has the patient had a weight loss or gain of 10 pounds or more in the last 3 months?: No Has the patient had a decrease in food intake/or appetite?: No Does the patient have dental problems?: No Does the patient have eating habits or behaviors that may be indicators of an eating disorder including binging or inducing vomiting?: No Has the patient recently lost weight without trying?: 0 Has the patient been eating poorly because of a decreased appetite?: 0 Malnutrition Screening Tool Score: 0    Physical Exam Constitutional:      Appearance: She is  obese.  Musculoskeletal:     Left ankle:     Left Achilles Tendon: Defect present.  Neurological:     Mental Status: She is alert and oriented to person, place, and time.  Psychiatric:        Mood and Affect: Mood is anxious.        Speech: Speech normal.        Behavior: Behavior normal.        Thought Content: Thought content includes suicidal ideation.    Review of Systems  Constitutional: Negative.   HENT: Negative.    Psychiatric/Behavioral:  Positive for suicidal ideas. The patient is nervous/anxious.     Blood pressure (!) 148/98, pulse 83, temperature 98.6 F (37 C), temperature source Oral, resp. rate 20, SpO2 99%. There is no height or weight on file to calculate BMI.  Past Psychiatric History: DID, PTSD, GAD, MDD w/ psychotic features    Is the patient at risk to self? No  Has the patient been a risk to self in the past 6 months? Yes .    Has the patient been a risk to self within the distant past? Yes   Is the patient a risk to others? No   Has the patient been a risk to others in the past 6 months? No   Has the patient been a risk to others within the distant past? No   Past Medical History:  Past Medical History:  Diagnosis Date   Abdominal pain, suprapubic 06/04/2016   Abnormal Pap smear of cervix    Acute cystitis 03/10/2018  Anxiety    Arthritis    C. difficile diarrhea 06/12/2018   Cataract    Chest pain 03/28/2018   Depression    Depression with suicidal ideation 03/09/2018   Diarrhea 06/13/2018   GERD (gastroesophageal reflux disease)    History of kidney stones    Hypomagnesemia 06/13/2018   Kidney stone    Davis back pain 07/22/2012   Migraine    Non-suicidal self harm as coping mechanism (HCC) 12/25/2019   Oral thrush 06/12/2018   Ovarian cyst    PCOS (polycystic ovarian syndrome)    PTSD (post-traumatic stress disorder)    Scarlet fever    Sleep apnea    Substance abuse (HCC)    Suicidal ideation 04/12/2018    Family History:  Mother: depression, PTSD; sister:bipolar disorder; maternal grandfather: alcohol use; maternal uncle: depression  Social History: Patient is single and lives independently. She does not have children. Currently unemployed, but has been applying to jobs.  Endorses extensive past trauma history (did not disclose).   Last Labs:  Admission on 06/03/2024  Component Date Value Ref Range Status   Preg Test, Ur 06/03/2024 Negative  Negative Final   POC Amphetamine UR 06/03/2024 None Detected  NONE DETECTED (Cut Off Level 1000 ng/mL) Final   POC Secobarbital (BAR) 06/03/2024 None Detected  NONE DETECTED (Cut Off Level 300 ng/mL) Final   POC Buprenorphine (BUP) 06/03/2024 None Detected  NONE DETECTED (Cut Off Level 10 ng/mL) Final   POC Oxazepam (BZO) 06/03/2024 None Detected  NONE DETECTED (Cut Off Level 300 ng/mL) Final   POC Cocaine UR 06/03/2024 None Detected  NONE DETECTED (Cut Off Level 300 ng/mL) Final   POC Methamphetamine UR 06/03/2024 None Detected  NONE DETECTED (Cut Off Level 1000 ng/mL) Final   POC Morphine 06/03/2024 None Detected  NONE DETECTED (Cut Off Level 300 ng/mL) Final   POC Methadone UR 06/03/2024 None Detected  NONE DETECTED (Cut Off Level 300 ng/mL) Final   POC Oxycodone  UR 06/03/2024 None Detected  NONE DETECTED (Cut Off Level 100 ng/mL) Final   POC Marijuana UR 06/03/2024 None Detected  NONE DETECTED (Cut Off Level 50 ng/mL) Final  Admission on 04/20/2024, Discharged on 04/21/2024  Component Date Value Ref Range Status   Sodium 04/21/2024 135  135 - 145 mmol/L Final   Potassium 04/21/2024 3.5  3.5 - 5.1 mmol/L Final   Chloride 04/21/2024 101  98 - 111 mmol/L Final   CO2 04/21/2024 24  22 - 32 mmol/L Final   Glucose, Bld 04/21/2024 95  70 - 99 mg/dL Final   Glucose reference range applies only to samples taken after fasting for at least 8 hours.   BUN 04/21/2024 16  6 - 20 mg/dL Final   Creatinine, Ser 04/21/2024 0.85  0.44 - 1.00 mg/dL Final   Calcium 92/91/7974 8.9  8.9  - 10.3 mg/dL Final   Total Protein 92/91/7974 7.1  6.5 - 8.1 g/dL Final   Albumin 92/91/7974 4.2  3.5 - 5.0 g/dL Final   AST 92/91/7974 26  15 - 41 U/L Final   ALT 04/21/2024 35  0 - 44 U/L Final   Alkaline Phosphatase 04/21/2024 73  38 - 126 U/L Final   Total Bilirubin 04/21/2024 0.9  0.0 - 1.2 mg/dL Final   GFR, Estimated 04/21/2024 >60  >60 mL/min Final   Comment: (NOTE) Calculated using the CKD-EPI Creatinine Equation (2021)    Anion gap 04/21/2024 10  5 - 15 Final   Performed at [facility], [address] [place], [place] [address]   Alcohol, Ethyl (B) 04/21/2024 <15  <15 mg/dL Final   Comment: (NOTE) For medical purposes only. Performed at [facility], [address], [place], [place] [address]    WBC 04/21/2024 10.1  4.0 - 10.5 K/uL Final   RBC 04/21/2024 4.40  3.87 - 5.11 MIL/uL Final   Hemoglobin 04/21/2024 13.5  12.0 - 15.0 g/dL Final   HCT 92/91/7974 40.6  36.0 - 46.0 % Final   MCV 04/21/2024 92.3  80.0 - 100.0 fL Final   MCH 04/21/2024 30.7  26.0 - 34.0 pg Final   MCHC 04/21/2024 33.3  30.0 - 36.0 g/dL Final   RDW 92/91/7974 12.4  11.5 - 15.5 % Final   Platelets 04/21/2024 275  150 - 400 K/uL Final   nRBC 04/21/2024 0.0  0.0 - 0.2 % Final   Performed at [facility], [address], [place], [place] [address]   Opiates 04/21/2024 NONE DETECTED  NONE DETECTED Final   Cocaine 04/21/2024 NONE DETECTED  NONE DETECTED Final   Benzodiazepines 04/21/2024 NONE DETECTED  NONE DETECTED Final   Amphetamines 04/21/2024 NONE DETECTED  NONE DETECTED Final   Tetrahydrocannabinol 04/21/2024 NONE DETECTED  NONE DETECTED Final   Barbiturates 04/21/2024 NONE DETECTED  NONE DETECTED Final   Comment: (NOTE) DRUG SCREEN FOR MEDICAL PURPOSES ONLY.  IF CONFIRMATION IS NEEDED FOR ANY PURPOSE, NOTIFY LAB WITHIN 5 DAYS.  LOWEST DETECTABLE LIMITS FOR URINE DRUG SCREEN Drug Class                     Cutoff (ng/mL) Amphetamine  and metabolites    1000 Barbiturate and metabolites    200 Benzodiazepine                 200 Opiates and metabolites        300 Cocaine and metabolites        300 THC                            50 Performed at [facility], [address] [place], [place] [address]    Preg, Serum 04/21/2024 NEGATIVE  NEGATIVE Final   Comment:        THE SENSITIVITY OF THIS METHODOLOGY IS >10 mIU/mL. Performed at [facility], [address], [place], [place] [address]   Admission on 04/09/2024, Discharged on 04/10/2024  Component Date Value Ref Range Status   Sodium 04/09/2024 139  135 - 145 mmol/L Final   Potassium 04/09/2024 3.7  3.5 - 5.1 mmol/L Final   Chloride 04/09/2024 103  98 - 111 mmol/L Final   CO2 04/09/2024 24  22 - 32 mmol/L Final   Glucose, Bld 04/09/2024 91  70 - 99 mg/dL Final   Glucose reference range applies only to samples taken after fasting for at least 8 hours.   BUN 04/09/2024 18  6 - 20 mg/dL Final   Creatinine, Ser 04/09/2024 1.02 (H)  0.44 - 1.00 mg/dL Final   Calcium 93/73/7974 9.2  8.9 - 10.3 mg/dL Final   Total Protein 93/73/7974 7.1  6.5 - 8.1 g/dL Final   Albumin 93/73/7974 3.9  3.5 - 5.0 g/dL Final   AST 93/73/7974 30  15 - 41 U/L Final   ALT 04/09/2024 39  0 - 44 U/L Final   Alkaline Phosphatase 04/09/2024 83  38 - 126 U/L Final   Total  Bilirubin 04/09/2024 1.2  0.0 - 1.2 mg/dL Final   GFR, Estimated 04/09/2024 >60  >60 mL/min Final   Comment: (NOTE) Calculated using the CKD-EPI Creatinine Equation (2021)    Anion gap 04/09/2024 12  5 - 15 Final   Performed at [facility], [address], [place], [place] [address]   Alcohol, Ethyl (B) 04/09/2024 <15  <15 mg/dL Final   Comment: (NOTE) For medical purposes only. Performed at [facility], [address], [place], [place] [address]    Opiates 04/09/2024 NONE DETECTED  NONE DETECTED Final   Cocaine 04/09/2024 NONE DETECTED  NONE  DETECTED Final   Benzodiazepines 04/09/2024 POSITIVE (A)  NONE DETECTED Final   Amphetamines 04/09/2024 NONE DETECTED  NONE DETECTED Final   Tetrahydrocannabinol 04/09/2024 NONE DETECTED  NONE DETECTED Final   Barbiturates 04/09/2024 NONE DETECTED  NONE DETECTED Final   Comment: (NOTE) DRUG SCREEN FOR MEDICAL PURPOSES ONLY.  IF CONFIRMATION IS NEEDED FOR ANY PURPOSE, NOTIFY LAB WITHIN 5 DAYS.  LOWEST DETECTABLE LIMITS FOR URINE DRUG SCREEN Drug Class                     Cutoff (ng/mL) Amphetamine and metabolites    1000 Barbiturate and metabolites    200 Benzodiazepine                 200 Opiates and metabolites        300 Cocaine and metabolites        300 THC                            50 Performed at [facility], [address], [place], [place] [address]    WBC 04/09/2024 11.3 (H)  4.0 - 10.5 K/uL Final   RBC 04/09/2024 4.72  3.87 - 5.11 MIL/uL Final   Hemoglobin 04/09/2024 14.4  12.0 - 15.0 g/dL Final   HCT 93/73/7974 43.4  36.0 - 46.0 % Final   MCV 04/09/2024 91.9  80.0 - 100.0 fL Final   MCH 04/09/2024 30.5  26.0 - 34.0 pg Final   MCHC 04/09/2024 33.2  30.0 - 36.0 g/dL Final   RDW 93/73/7974 12.2  11.5 - 15.5 % Final   Platelets 04/09/2024 295  150 - 400 K/uL Final   nRBC 04/09/2024 0.0  0.0 - 0.2 % Final   Neutrophils Relative % 04/09/2024 68  % Final   Neutro Abs 04/09/2024 7.6  1.7 - 7.7 K/uL Final   Lymphocytes Relative 04/09/2024 26  % Final   Lymphs Abs 04/09/2024 3.0  0.7 - 4.0 K/uL Final   Monocytes Relative 04/09/2024 5  % Final   Monocytes Absolute 04/09/2024 0.6  0.1 - 1.0 K/uL Final   Eosinophils Relative 04/09/2024 1  % Final   Eosinophils Absolute 04/09/2024 0.1  0.0 - 0.5 K/uL Final   Basophils Relative 04/09/2024 0  % Final   Basophils Absolute 04/09/2024 0.0  0.0 - 0.1 K/uL Final   Immature Granulocytes 04/09/2024 0  % Final   Abs Immature Granulocytes 04/09/2024 0.04  0.00 - 0.07 K/uL Final   Performed at [facility], [address], [place], [place] [address]   Preg, Serum 04/09/2024 NEGATIVE  NEGATIVE Final   Comment:        THE SENSITIVITY OF THIS METHODOLOGY IS >10 mIU/mL. Performed at [facility], [address], [place], [place] [address]  Color, Urine 04/09/2024 YELLOW  YELLOW Final   APPearance 04/09/2024 HAZY (A)  CLEAR Final   Specific Gravity, Urine 04/09/2024 1.031 (H)  1.005 - 1.030 Final   pH 04/09/2024 5.0  5.0 - 8.0 Final   Glucose, UA 04/09/2024 NEGATIVE  NEGATIVE mg/dL Final   Hgb urine dipstick 04/09/2024 SMALL (A)  NEGATIVE Final   Bilirubin Urine 04/09/2024 NEGATIVE  NEGATIVE Final   Ketones, ur 04/09/2024 NEGATIVE  NEGATIVE mg/dL Final   Protein, ur 93/73/7974 NEGATIVE  NEGATIVE mg/dL Final   Nitrite 93/73/7974 NEGATIVE  NEGATIVE Final   Leukocytes,Ua 04/09/2024 TRACE (A)  NEGATIVE Final   RBC / HPF 04/09/2024 0-5  0 - 5 RBC/hpf Final   WBC, UA 04/09/2024 0-5  0 - 5 WBC/hpf Final   Bacteria, UA 04/09/2024 RARE (A)  NONE SEEN Final   Squamous Epithelial / HPF 04/09/2024 0-5  0 - 5 /HPF Final   Mucus 04/09/2024 PRESENT   Final   Hyaline Casts, UA 04/09/2024 PRESENT   Final   Performed at [facility], [address] [place], [place] [address]   Magnesium  04/09/2024 2.0  1.7 - 2.4 mg/dL Final   Performed at [facility], [address], [place], [place] [address]  Admission on 03/16/2024, Discharged on 03/17/2024  Component Date Value Ref Range Status   WBC 03/16/2024 8.5  4.0 - 10.5 K/uL Final   RBC 03/16/2024 4.44  3.87 - 5.11 MIL/uL Final   Hemoglobin 03/16/2024 13.8  12.0 - 15.0 g/dL Final   HCT 93/97/7974 41.2  36.0 - 46.0 % Final   MCV 03/16/2024 92.8  80.0 - 100.0 fL Final   MCH 03/16/2024 31.1  26.0 - 34.0 pg Final   MCHC 03/16/2024 33.5  30.0 - 36.0 g/dL Final   RDW 93/97/7974 12.6  11.5 - 15.5 % Final   Platelets 03/16/2024 266  150 - 400 K/uL Final   nRBC 03/16/2024 0.0  0.0 - 0.2 % Final    Neutrophils Relative % 03/16/2024 56  % Final   Neutro Abs 03/16/2024 4.8  1.7 - 7.7 K/uL Final   Lymphocytes Relative 03/16/2024 34  % Final   Lymphs Abs 03/16/2024 2.8  0.7 - 4.0 K/uL Final   Monocytes Relative 03/16/2024 7  % Final   Monocytes Absolute 03/16/2024 0.6  0.1 - 1.0 K/uL Final   Eosinophils Relative 03/16/2024 2  % Final   Eosinophils Absolute 03/16/2024 0.2  0.0 - 0.5 K/uL Final   Basophils Relative 03/16/2024 1  % Final   Basophils Absolute 03/16/2024 0.0  0.0 - 0.1 K/uL Final   Immature Granulocytes 03/16/2024 0  % Final   Abs Immature Granulocytes 03/16/2024 0.03  0.00 - 0.07 K/uL Final   Performed at [facility], [address] [place], [place] [address]   Sodium 03/16/2024 141  135 - 145 mmol/L Final   Potassium 03/16/2024 3.9  3.5 - 5.1 mmol/L Final   Chloride 03/16/2024 107  98 - 111 mmol/L Final   CO2 03/16/2024 25  22 - 32 mmol/L Final   Glucose, Bld 03/16/2024 113 (H)  70 - 99 mg/dL Final   Glucose reference range applies only to samples taken after fasting for at least 8 hours.   BUN 03/16/2024 19  6 - 20 mg/dL Final   Creatinine, Ser 03/16/2024 1.24 (H)  0.44 - 1.00 mg/dL Final   Calcium 93/97/7974 9.2  8.9 - 10.3 mg/dL Final   Total Protein 93/97/7974 5.8 (L)  6.5 - 8.1 g/dL Final   Albumin 93/97/7974 3.5  3.5 - 5.0 g/dL Final   AST 93/97/7974 23  15 - 41 U/L Final   ALT 03/16/2024 39  0 - 44 U/L Final   Alkaline Phosphatase 03/16/2024 76  38 - 126 U/L Final   Total Bilirubin 03/16/2024 0.5  0.0 - 1.2 mg/dL Final   GFR, Estimated 03/16/2024 57 (L)  >60 mL/min Final   Comment: (NOTE) Calculated using the CKD-EPI Creatinine Equation (2021)    Anion gap 03/16/2024 9  5 - 15 Final   Performed at [facility], [address] [place], [place] [address]   Hgb A1c MFr Bld 03/16/2024 4.6 (L)  4.8 - 5.6 % Final   Comment: (NOTE) Diagnosis of Diabetes The following HbA1c ranges recommended by the American Diabetes Association (ADA) may be used as  an aid in the diagnosis of diabetes mellitus.  Hemoglobin             Suggested A1C NGSP%              Diagnosis  <5.7                   Non Diabetic  5.7-6.4                Pre-Diabetic  >6.4                   Diabetic  <7.0                   Glycemic control for                       adults with diabetes.     Mean Plasma Glucose 03/16/2024 85.32  mg/dL Final   Performed at [facility], [address] [place], [place] [address]   Cholesterol 03/16/2024 169  0 - 200 mg/dL Final   Triglycerides 93/97/7974 127  <150 mg/dL Final   HDL 93/97/7974 38 (L)  >40 mg/dL Final   Total CHOL/HDL Ratio 03/16/2024 4.4  RATIO Final   VLDL 03/16/2024 25  0 - 40 mg/dL Final   LDL Cholesterol 03/16/2024 106 (H)  0 - 99 mg/dL Final   Comment:        Total Cholesterol/HDL:CHD Risk Coronary Heart Disease Risk Table                     Men   Women  1/2 Average Risk   3.4   3.3  Average Risk       5.0   4.4  2 X Average Risk   9.6   7.1  3 X Average Risk  23.4   11.0        Use the calculated Patient Ratio above and the CHD Risk Table to determine the patient's CHD Risk.        ATP III CLASSIFICATION (LDL):  <100     mg/dL   Optimal  899-870  mg/dL   Near or Above                    Optimal  130-159  mg/dL   Borderline  839-810  mg/dL   High  >809     mg/dL   Very High Performed at [facility], [address], [place], [place] [address]    TSH 03/16/2024 2.009  0.350 - 4.500 uIU/mL Final  Comment: Performed by a 3rd Generation assay with a functional sensitivity of <=0.01 uIU/mL. Performed at [facility], [address] [place], [place] [address]   Admission on 02/25/2024, Discharged on 02/26/2024  Component Date Value Ref Range Status   Sodium 02/25/2024 140  135 - 145 mmol/L Final   Potassium 02/25/2024 3.5  3.5 - 5.1 mmol/L Final   Chloride 02/25/2024 104  98 - 111 mmol/L Final   CO2 02/25/2024 22  22 - 32 mmol/L Final   Glucose, Bld 02/25/2024 129 (H)  70 -  99 mg/dL Final   Glucose reference range applies only to samples taken after fasting for at least 8 hours.   BUN 02/25/2024 11  6 - 20 mg/dL Final   Creatinine, Ser 02/25/2024 0.88  0.44 - 1.00 mg/dL Final   Calcium 94/86/7974 9.3  8.9 - 10.3 mg/dL Final   Total Protein 94/86/7974 7.3  6.5 - 8.1 g/dL Final   Albumin 94/86/7974 4.0  3.5 - 5.0 g/dL Final   AST 94/86/7974 29  15 - 41 U/L Final   ALT 02/25/2024 43  0 - 44 U/L Final   Alkaline Phosphatase 02/25/2024 75  38 - 126 U/L Final   Total Bilirubin 02/25/2024 1.0  0.0 - 1.2 mg/dL Final   GFR, Estimated 02/25/2024 >60  >60 mL/min Final   Comment: (NOTE) Calculated using the CKD-EPI Creatinine Equation (2021)    Anion gap 02/25/2024 14  5 - 15 Final   Performed at [facility], [address], [place], [place] [address]   Alcohol, Ethyl (B) 02/25/2024 <15  <15 mg/dL Final   Comment: Please note change in reference range. (NOTE) For medical purposes only. Performed at [facility], [address] [place], [place] [address]    WBC 02/25/2024 11.0 (H)  4.0 - 10.5 K/uL Final   RBC 02/25/2024 5.04  3.87 - 5.11 MIL/uL Final   Hemoglobin 02/25/2024 15.6 (H)  12.0 - 15.0 g/dL Final   HCT 94/86/7974 46.1 (H)  36.0 - 46.0 % Final   MCV 02/25/2024 91.5  80.0 - 100.0 fL Final   MCH 02/25/2024 31.0  26.0 - 34.0 pg Final   MCHC 02/25/2024 33.8  30.0 - 36.0 g/dL Final   RDW 94/86/7974 12.8  11.5 - 15.5 % Final   Platelets 02/25/2024 313  150 - 400 K/uL Final   nRBC 02/25/2024 0.0  0.0 - 0.2 % Final   Performed at [facility], [address] [place], [place] [address]   Opiates 02/25/2024 NONE DETECTED  NONE DETECTED Final   Cocaine 02/25/2024 NONE DETECTED  NONE DETECTED Final   Benzodiazepines 02/25/2024 NONE DETECTED  NONE DETECTED Final   Amphetamines 02/25/2024 NONE DETECTED  NONE DETECTED Final   Tetrahydrocannabinol 02/25/2024 NONE DETECTED  NONE DETECTED Final   Barbiturates 02/25/2024 NONE DETECTED  NONE  DETECTED Final   Comment: (NOTE) DRUG SCREEN FOR MEDICAL PURPOSES ONLY.  IF CONFIRMATION IS NEEDED FOR ANY PURPOSE, NOTIFY LAB WITHIN 5 DAYS.  LOWEST DETECTABLE LIMITS FOR URINE DRUG SCREEN Drug Class                     Cutoff (ng/mL) Amphetamine and metabolites    1000 Barbiturate and metabolites    200 Benzodiazepine                 200 Opiates and metabolites        300 Cocaine and metabolites  300 THC                            50 Performed at [facility], [address], [place], [place] [address]    Preg, Serum 02/25/2024 NEGATIVE  NEGATIVE Final   Comment:        THE SENSITIVITY OF THIS METHODOLOGY IS >10 mIU/mL. Performed at [facility], [address], [place], [place] [address]    Acetaminophen  (Tylenol ), Serum 02/25/2024 <10 (L)  10 - 30 ug/mL Final   Comment: (NOTE) Therapeutic concentrations vary significantly. A range of 10-30 ug/mL  may be an effective concentration for many patients. However, some  are best treated at concentrations outside of this range. Acetaminophen  concentrations >150 ug/mL at 4 hours after ingestion  and >50 ug/mL at 12 hours after ingestion are often associated with  toxic reactions.  Performed at [facility], [address], [place], [place] [address]    Salicylate Lvl 02/25/2024 <7.0 (L)  7.0 - 30.0 mg/dL Final   Performed at [facility], [address], [place], [place] [address]  Admission on 02/21/2024, Discharged on 02/21/2024  Component Date Value Ref Range Status   WBC 02/21/2024 7.5  4.0 - 10.5 K/uL Final   RBC 02/21/2024 4.44  3.87 - 5.11 MIL/uL Final   Hemoglobin 02/21/2024 13.7  12.0 - 15.0 g/dL Final   HCT 94/90/7974 40.1  36.0 - 46.0 % Final   MCV 02/21/2024 90.3  80.0 - 100.0 fL Final   MCH 02/21/2024 30.9  26.0 - 34.0 pg Final   MCHC 02/21/2024 34.2  30.0 - 36.0 g/dL Final   RDW 94/90/7974 13.1  11.5 - 15.5 % Final   Platelets 02/21/2024 235  150 - 400 K/uL Final   nRBC  02/21/2024 0.0  0.0 - 0.2 % Final   Performed at [facility], [address] [place], [place] [address]   Sodium 02/21/2024 143  135 - 145 mmol/L Final   Potassium 02/21/2024 3.9  3.5 - 5.1 mmol/L Final   Chloride 02/21/2024 106  98 - 111 mmol/L Final   CO2 02/21/2024 26  22 - 32 mmol/L Final   Glucose, Bld 02/21/2024 100 (H)  70 - 99 mg/dL Final   Glucose reference range applies only to samples taken after fasting for at least 8 hours.   BUN 02/21/2024 14  6 - 20 mg/dL Final   Creatinine, Ser 02/21/2024 0.95  0.44 - 1.00 mg/dL Final   Calcium 94/90/7974 9.3  8.9 - 10.3 mg/dL Final   Total Protein 94/90/7974 6.3 (L)  6.5 - 8.1 g/dL Final   Albumin 94/90/7974 3.9  3.5 - 5.0 g/dL Final   AST 94/90/7974 28  15 - 41 U/L Final   ALT 02/21/2024 42  0 - 44 U/L Final   Alkaline Phosphatase 02/21/2024 92  38 - 126 U/L Final   Total Bilirubin 02/21/2024 0.5  0.0 - 1.2 mg/dL Final   GFR, Estimated 02/21/2024 >60  >60 mL/min Final   Comment: (NOTE) Calculated using the CKD-EPI Creatinine Equation (2021)    Anion gap 02/21/2024 11  5 - 15 Final   Performed at [facility], [address], [place], [place] [address]   TSH 02/21/2024 2.170  0.350 - 4.500 uIU/mL Final   Performed at [facility], [address] [place], [place] [address]   Preg, Serum 02/21/2024 NEGATIVE  NEGATIVE Final  Comment:        THE SENSITIVITY OF THIS METHODOLOGY IS >10 mIU/mL. Performed at [facility], [address], [place], [place] [address]   Admission on 02/20/2024, Discharged on 02/20/2024  Component Date Value Ref Range Status   Color, UA 02/20/2024 yellow  yellow Final   Clarity, UA 02/20/2024 clear  clear Final   Glucose, UA 02/20/2024 negative  negative mg/dL Final   Bilirubin, UA 94/91/7974 negative  negative Final   Ketones, POC UA 02/20/2024 negative  negative mg/dL Final   Spec Grav, UA 94/91/7974 1.025  1.010 - 1.025  Final   Blood, UA 02/20/2024 trace-lysed (A)  negative Final   pH, UA 02/20/2024 6.0  5.0 - 8.0 Final   Protein Ur, POC 02/20/2024 negative  negative mg/dL Final   Urobilinogen, UA 02/20/2024 1.0  0.2 or 1.0 E.U./dL Final   Nitrite, UA 94/91/7974 Negative  Negative Final   Leukocytes, UA 02/20/2024 Negative  Negative Final   Preg Test, Ur 02/20/2024 Negative  Negative Final    Allergies: Adhesive [tape], Cherry, Sulfamethoxazole-trimethoprim, Levofloxacin, Morphine, Tizanidine, Gabapentin, Metformin, Sulfa antibiotics, and Sumatriptan  Medications:  Facility Ordered Medications  Medication   alum & mag hydroxide-simeth (MAALOX/MYLANTA) 200-200-20 MG/5ML suspension 30 mL   magnesium  hydroxide (MILK OF MAGNESIA) suspension 30 mL   haloperidol  (HALDOL ) tablet 5 mg   And   diphenhydrAMINE  (BENADRYL ) capsule 50 mg   haloperidol  lactate (HALDOL ) injection 5 mg   And   diphenhydrAMINE  (BENADRYL ) injection 50 mg   And   LORazepam  (ATIVAN ) injection 2 mg   haloperidol  lactate (HALDOL ) injection 10 mg   And   diphenhydrAMINE  (BENADRYL ) injection 50 mg   And   LORazepam  (ATIVAN ) injection 2 mg   [START ON 06/04/2024] ARIPiprazole  (ABILIFY ) tablet 5 mg   escitalopram  (LEXAPRO ) tablet 10 mg   methocarbamol  (ROBAXIN ) tablet 500 mg   acetaminophen  (TYLENOL ) tablet 1,000 mg   PTA Medications  Medication Sig   ergocalciferol  (VITAMIN D2) 1.25 MG (50000 UT) capsule Take 1 capsule (50,000 Units total) by mouth once a week. (Patient taking differently: Take 50,000 Units by mouth every Friday.)   LORazepam  (ATIVAN ) 0.5 MG tablet Take 1-2 tablets (0.5-1 mg total) by mouth every 4-6 hours as needed for severe panic attacks. (Patient not taking: Reported on 04/09/2024)   levonorgestrel (MIRENA, 52 MG,) 20 MCG/DAY IUD 1 each by Intrauterine route once.   methocarbamol  (ROBAXIN ) 500 MG tablet Take 500 mg by mouth 2 (two) times daily as needed for muscle spasms.   polyethylene glycol powder  (GLYCOLAX /MIRALAX ) 17 GM/SCOOP powder Take 17 g by mouth daily as needed for mild constipation or moderate constipation.   sennosides-docusate sodium  (SENOKOT-S) 8.6-50 MG tablet Take 1 tablet by mouth daily.   LORazepam  (ATIVAN ) 1 MG tablet Take 1 mg by mouth at bedtime.   acetaminophen  (TYLENOL ) 500 MG tablet Take 1,000 mg by mouth every 8 (eight) hours.   ondansetron  (ZOFRAN ) 4 MG tablet Take 4 mg by mouth every 8 (eight) hours as needed for nausea or vomiting.   ibuprofen  (ADVIL ) 600 MG tablet Take 600 mg by mouth 4 (four) times daily.   ARIPiprazole  (ABILIFY ) 5 MG tablet Take 1 tablet (5 mg total) by mouth every morning.   escitalopram  (LEXAPRO ) 10 MG tablet Take 1 tablet (10 mg total) by mouth at bedtime.   hydrOXYzine  (ATARAX ) 25 MG tablet Take 1 tablet (25 mg total) by mouth 3 (three) times daily as needed for anxiety.   Ivermectin  1 % CREA  Apply 1 Application topically at bedtime.      Medical Decision Making  Patient presents with symptoms that appear consistent with exacerbation of historical diagnoses of PTSD and GAD that have caused significant impairment in functioning. Patient is not currently established with a therapist or outside resources. Patient would benefit from [facility] or a day program for therapy, support, and a sense of community. After discussing case with attending physician, Dr. Zouev, patient has been recommended for inpatient due to newly obtained information. Per Dr. Zouev, patient verbalized increasing suicidal ideation with plan to overdose on medications for the last week and reports experiencing these thoughts as recently as today. Based on suicidal ideation with plan, patient meets criteria for inpatient treatment. Patient will be admitted to observation unit pending acceptance at an inpatient facility.  PLAN:  --Admission: Observation unit, pending acceptance to inpatient facility    --Disposition: Voluntary --Medical Interventions:     -Labs: CBC, CMP, Urine  drug screen, Urine pregnancy test , Ethanol , and EKG    -Medications:      -Maalox suspension 30 mLs Q4H PRN for indigestion       -magnesium  hydroxide suspension 30 mLs daily PRN for mild constipation    --Psychiatric Interventions:     -Agitation Protocol     -Continue home Abilify  5 mg daily      -Continue home Lexapro  10 mg daily      -Continue home Vistaril  25 mg TID PRN for anxiety      -Continue Lorazepam  0.5 mg at bedtime as needed for sleep/ anxiety               -Continue Robaxin  500 mg BID PRN for muscle spasms     -Continue Tylenol  1000 mg Q8HR PRN for pain    Recommendations  Based on my evaluation the patient does not appear to have an emergency medical condition.  Rolin DELENA Lipps, NP 06/03/24  5:56 PM

## 2024-06-03 NOTE — Progress Notes (Signed)
 Pt admitted to [facility] due to self-harm thoughts, no plan or intent. Pt currently denies and verbally contracts for safety on the unit. Pt was advised to notify staff when having intrusive thoughts of hurting self or others. Pt verbalized understanding. Pt is alert and oriented X4. Pt is ambulatory and is oriented  staff/unit. Pt was cooperative with labs and skin assessment. Scars were noted on pt's bilateral legs and arms, and scratches on pt's right thigh. Pt reported that she self inflicted those cuts this past July. Pt has a hard boot on right leg. Pt reported torn achilles and born spur in her heel. Pt complained of pian 7/10. PRN Acetaminophen  administered per order. Pt denies current HI/AVH. Staff will monitor for pt's safety.
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
## 2024-06-03 NOTE — Progress Notes (Signed)
 Inpatient Psychiatric Referral  Patient was recommended inpatient per Jasmine Levy, NP. There are no available beds at [facility], per [facility] . Patient was referred to the following out of network facilities:  Destination  Service Provider Request Status Address Phone Fax  [facility]  Pending - Request Sent [address], [place] [address] [phone]  [facility]  Pending - Request Sent [address] [place] [address] [phone]  [facility]  Pending - Request Sent [address], [place] [address] [phone]  [facility]  Pending - Request Sent [address] [place] [address] [phone]  [facility]  Pending - Request Sent [address], [place] [address] [phone]  [facility]  Pending - Request Sent [address] [place] [phone]  [facility]  Pending - Request Sent [address] [place] [address] [phone]  [facility]  Pending - Request Sent [address] [place] [address] [phone]    Situation ongoing, CSW to continue following and update chart as more information becomes available.   Harrie Sofia MSW, LCSWA 06/03/2024  6:17PM
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
## 2024-06-04 MED ORDER — ACETAMINOPHEN 500 MG PO TABS
1000.0000 mg | ORAL_TABLET | Freq: Three times a day (TID) | ORAL | Status: DC | PRN
  Administered 2024-06-04: 1000 mg via ORAL

## 2024-06-04 NOTE — ED Notes (Signed)
 Patient refused to take her LEXAPRO  and her Ativan  0.5 mg. Patient says she takes LEXAPRO  in the morning and Ativan  1 mg. Patient is requesting to see the provider to discuss her medication. Providers are aware of patient's request.

## 2024-06-04 NOTE — ED Notes (Signed)
 Patient complaining of left foot pain rating pain of 7/10. Tylenol  1000 mg given though patient wants to be given motrin . Provider saw patient and explained why she could not order Motrin  due to an interaction with LEXAPRO . Patient is upset and demanding to be discharged since her medication are not adjusted to how she wants them.

## 2024-06-04 NOTE — ED Provider Notes (Addendum)
 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  Stay Summary: Less than 24 hours stay on observation unit. Pt awake in unit most of the shift sitting up by the side of the bed, reports being dissatisfied with unit and milieu, reports seeing ants crawling all on the floor near her bed, and her neighbor currently talking to himself, making her unable to relax. She denies SI/HI/AVH or paranoia. She is requesting discharge, stating she will follow up outpatient with the [facility] program as initially recommended. Reports she was offered to stay in Observation unit for a day or two before she can proceed to the outpatient PHP program. Denies stating she was suicidal during her intake evaluation and did not need to be here. Reports her medical/physical issues like her leg fracture is bothering her and more important than her mental health issues.  Reports she uses a crutch to ambulate at home, currently weaning off and needs her legs elevated at all times which she is unable to do on the unit. (Patient is currently seated with her left leg elevated on a chair).  She refuses extra narcotic pain medication offered to relive her discomfort, refused extra ativan  and her robaxin , stating it would not help and she wants to return to her foot doctor in the am to find out why she can not get comfortable with the leg brace. States continued stay in observation unit will further deteriorate her  mental health.  Patient is provided with verbal encouragement to stay and continue treatment. Patient provided education with benefits of staying versus risks of leaving without completing treatment. Patient acknowledged her understanding of education provided but still insists on leaving AMA.   On evaluation, patient is alert, oriented x 4. Speech is clear and coherent. Pt appears appropriately dressed. Eye contact is good. Mood is anxious, affect is congruent with mood. Thought process is coherent and thought content is WDL. There is no objective indication that the patient is responding to internal stimuli. No delusions elicited during this assessment.    Pt denies SI/HI/AVH. There is no evidence of imminent risk of harm to self or others at this time. Patient is advised to call [phone] or return to the nearest ED if experiencing SI. She verbalized understanding and is in agreement.  Discharge recommendations:  Patient is to take medications as prescribed. Please follow up with your primary care provider for all medical related needs.   Therapy: We recommend that patient participate in individual therapy to address mental health concerns.  Medications: The patient or guardian is to contact a medical professional and/or outpatient provider to address any new side effects that develop. The patient or guardian should update outpatient providers of any new medications and/or medication changes.   Atypical antipsychotics: If you are prescribed an atypical antipsychotic, it is recommended that your height, weight, BMI, blood pressure, fasting lipid panel, and fasting blood sugar be monitored by your outpatient providers.  Safety:  The patient should abstain from use of illicit substances/drugs and  abuse of any medications. If symptoms worsen or do not continue to improve or if the patient becomes actively suicidal or homicidal then it is recommended that the patient return to the closest hospital  emergency department, the [facility], or call 911 for further evaluation and treatment. National Suicide Prevention Lifeline 1-800-SUICIDE or (858)856-1731.  About 988 988 offers 24/7 access to trained crisis counselors who can help people experiencing mental health-related distress. People can call or text 988 or chat 988lifeline.org for themselves or if they are worried about a loved one who may need crisis support.  Crisis Mobile: [facility] [facility]:                     (316) 587-5553 (for crisis response 24 hours a day) [facility] Hotline:                                            808-673-1991   Total Time spent with patient: 20 minutes  Past Psychiatric History: See H & P Past Medical History: See Chart Family History: N/A FTobacco Cessation:  Prescription not provided because: refused  Current Medications:  Current Facility-Administered Medications  Medication Dose Route Frequency Provider Last Rate Last Admin   acetaminophen  (TYLENOL ) tablet 1,000 mg  1,000 mg Oral TID PRN Nuchem Grattan V, NP   1,000 mg at 06/04/24 0354   alum & mag hydroxide-simeth (MAALOX/MYLANTA) 200-200-20 MG/5ML suspension 30 mL  30 mL Oral Q4H PRN Levy, Jasmine A, NP       ARIPiprazole  (ABILIFY ) tablet 5 mg  5 mg Oral Daily Levy, Jasmine A, NP       haloperidol  (HALDOL ) tablet 5 mg  5 mg Oral TID PRN Levy, Jasmine A, NP       And   diphenhydrAMINE  (BENADRYL ) capsule 50 mg  50 mg Oral TID PRN Levy, Jasmine A, NP       haloperidol  lactate (HALDOL ) injection 5 mg  5 mg Intramuscular TID PRN Levy, Jasmine A, NP       And   diphenhydrAMINE  (BENADRYL ) injection 50 mg  50 mg Intramuscular TID PRN Levy, Jasmine A, NP       And   LORazepam  (ATIVAN ) injection 2 mg  2 mg Intramuscular TID PRN Levy, Jasmine A, NP       haloperidol  lactate (HALDOL ) injection 10 mg  10 mg Intramuscular TID PRN Levy, Jasmine A, NP       And   diphenhydrAMINE  (BENADRYL ) injection 50 mg   50 mg Intramuscular TID PRN Levy, Jasmine A, NP       And   LORazepam  (ATIVAN ) injection 2 mg  2 mg Intramuscular TID PRN Levy, Jasmine A, NP       escitalopram  (LEXAPRO ) tablet 10 mg  10 mg Oral Daily Levy, Jasmine A, NP       LORazepam  (ATIVAN ) tablet 0.5 mg  0.5 mg Oral QHS Beryl, Jasmine A, NP       magnesium  hydroxide (MILK OF MAGNESIA) suspension 30 mL  30 mL Oral Daily PRN Levy, Jasmine A, NP       methocarbamol  (ROBAXIN ) tablet 500 mg  500 mg Oral BID PRN Levy, Jasmine A, NP       Current Outpatient Medications  Medication Sig Dispense Refill   acetaminophen  (TYLENOL ) 500 MG tablet Take 1,000 mg by mouth every 8 (eight) hours.  ARIPiprazole  (ABILIFY ) 5 MG tablet Take 1 tablet (5 mg total) by mouth every morning. 30 tablet 0   ergocalciferol  (VITAMIN D2) 1.25 MG (50000 UT) capsule Take 1 capsule (50,000 Units total) by mouth once a week. (Patient taking differently: Take 50,000 Units by mouth every Friday.) 4 capsule 2   escitalopram  (LEXAPRO ) 10 MG tablet Take 1 tablet (10 mg total) by mouth at bedtime. 30 tablet 0   hydrOXYzine  (ATARAX ) 25 MG tablet Take 1 tablet (25 mg total) by mouth 3 (three) times daily as needed for anxiety. 30 tablet 0   ibuprofen  (ADVIL ) 600 MG tablet Take 600 mg by mouth 4 (four) times daily.     Ivermectin  1 % CREA Apply 1 Application topically at bedtime. 45 g 1   levonorgestrel (MIRENA, 52 MG,) 20 MCG/DAY IUD 1 each by Intrauterine route once.     LORazepam  (ATIVAN ) 0.5 MG tablet Take 1-2 tablets (0.5-1 mg total) by mouth every 4-6 hours as needed for severe panic attacks. (Patient not taking: Reported on 04/09/2024) 10 tablet 2   LORazepam  (ATIVAN ) 1 MG tablet Take 1 mg by mouth at bedtime.     methocarbamol  (ROBAXIN ) 500 MG tablet Take 500 mg by mouth 2 (two) times daily as needed for muscle spasms.     ondansetron  (ZOFRAN ) 4 MG tablet Take 4 mg by mouth every 8 (eight) hours as needed for nausea or vomiting.     polyethylene glycol powder  (GLYCOLAX /MIRALAX ) 17 GM/SCOOP powder Take 17 g by mouth daily as needed for mild constipation or moderate constipation.     sennosides-docusate sodium  (SENOKOT-S) 8.6-50 MG tablet Take 1 tablet by mouth daily.      PTA Medications:  PTA Medications  Medication Sig   ergocalciferol  (VITAMIN D2) 1.25 MG (50000 UT) capsule Take 1 capsule (50,000 Units total) by mouth once a week. (Patient taking differently: Take 50,000 Units by mouth every Friday.)   LORazepam  (ATIVAN ) 0.5 MG tablet Take 1-2 tablets (0.5-1 mg total) by mouth every 4-6 hours as needed for severe panic attacks. (Patient not taking: Reported on 04/09/2024)   levonorgestrel (MIRENA, 52 MG,) 20 MCG/DAY IUD 1 each by Intrauterine route once.   methocarbamol  (ROBAXIN ) 500 MG tablet Take 500 mg by mouth 2 (two) times daily as needed for muscle spasms.   polyethylene glycol powder (GLYCOLAX /MIRALAX ) 17 GM/SCOOP powder Take 17 g by mouth daily as needed for mild constipation or moderate constipation.   sennosides-docusate sodium  (SENOKOT-S) 8.6-50 MG tablet Take 1 tablet by mouth daily.   LORazepam  (ATIVAN ) 1 MG tablet Take 1 mg by mouth at bedtime.   acetaminophen  (TYLENOL ) 500 MG tablet Take 1,000 mg by mouth every 8 (eight) hours.   ondansetron  (ZOFRAN ) 4 MG tablet Take 4 mg by mouth every 8 (eight) hours as needed for nausea or vomiting.   ibuprofen  (ADVIL ) 600 MG tablet Take 600 mg by mouth 4 (four) times daily.   ARIPiprazole  (ABILIFY ) 5 MG tablet Take 1 tablet (5 mg total) by mouth every morning.   escitalopram  (LEXAPRO ) 10 MG tablet Take 1 tablet (10 mg total) by mouth at bedtime.   hydrOXYzine  (ATARAX ) 25 MG tablet Take 1 tablet (25 mg total) by mouth 3 (three) times daily as needed for anxiety.   Ivermectin  1 % CREA Apply 1 Application topically at bedtime.   Facility Ordered Medications  Medication   alum & mag hydroxide-simeth (MAALOX/MYLANTA) 200-200-20 MG/5ML suspension 30 mL   magnesium  hydroxide (MILK OF MAGNESIA)  suspension 30 mL  haloperidol  (HALDOL ) tablet 5 mg   And   diphenhydrAMINE  (BENADRYL ) capsule 50 mg   haloperidol  lactate (HALDOL ) injection 5 mg   And   diphenhydrAMINE  (BENADRYL ) injection 50 mg   And   LORazepam  (ATIVAN ) injection 2 mg   haloperidol  lactate (HALDOL ) injection 10 mg   And   diphenhydrAMINE  (BENADRYL ) injection 50 mg   And   LORazepam  (ATIVAN ) injection 2 mg   ARIPiprazole  (ABILIFY ) tablet 5 mg   escitalopram  (LEXAPRO ) tablet 10 mg   methocarbamol  (ROBAXIN ) tablet 500 mg   LORazepam  (ATIVAN ) tablet 0.5 mg   acetaminophen  (TYLENOL ) tablet 1,000 mg       02/06/2023    3:34 PM 10/26/2022    3:18 PM 07/30/2022    4:46 PM  Depression screen PHQ 2/9  Decreased Interest 0 3 2  Down, Depressed, Hopeless 0 3 2  PHQ - 2 Score 0 6 4  Altered sleeping 0 3   Tired, decreased energy 0 3   Change in appetite 0 2   Feeling bad or failure about yourself  0 2   Trouble concentrating 0 3   Moving slowly or fidgety/restless 0 0   Suicidal thoughts 0 1   PHQ-9 Score 0 20   Difficult doing work/chores Not difficult at all Very difficult Very difficult    Flowsheet Row ED from 06/03/2024 in [facility] ED from [facility]/20/2024 in [facility] Department at [facility] ED from 04/16/2024 in [facility] Department at [facility]  C-SSRS RISK CATEGORY Low Risk High Risk No Risk    Musculoskeletal  Strength & Muscle Tone: within normal limits Gait & Station: wears a hrad boot on left lower extremity Patient leans: N/A  Psychiatric Specialty Exam  Presentation  General Appearance:  Appropriate for Environment  Eye Contact: Good  Speech: Clear and Coherent  Speech Volume: Normal  Handedness: Right   Mood and Affect  Mood: Anxious  Affect: Appropriate; Congruent   Thought Process  Thought Processes: Coherent  Descriptions of Associations:Intact  Orientation:Full (Time, Place and  Person)  Thought Content:WDL  Diagnosis of Schizophrenia or Schizoaffective disorder in past: No    Hallucinations:Hallucinations: None  Ideas of Reference:None  Suicidal Thoughts:Suicidal Thoughts: Yes, Active SI Active Intent and/or Plan: With Plan (overdose on medication)  Homicidal Thoughts:Homicidal Thoughts: No   Sensorium  Memory: Immediate Poor  Judgment: Fair  Insight: Fair   Chartered certified accountant: Fair  Attention Span: Fair  Recall: [employer] of Knowledge: Fair  Language: Fair   Psychomotor Activity  Psychomotor Activity: Psychomotor Activity: Normal   Assets  Assets: Manufacturing systems engineer; Desire for Improvement   Sleep  Sleep: Sleep: Poor  No Safety Checks orders active in given range  Nutritional Assessment (For OBS and FBC admissions only) Has the patient had a weight loss or gain of 10 pounds or more in the last 3 months?: No Has the patient had a decrease in food intake/or appetite?: No Does the patient have dental problems?: No Does the patient have eating habits or behaviors that may be indicators of an eating disorder including binging or inducing vomiting?: No Has the patient recently lost weight without trying?: 0 Has the patient been eating poorly because of a decreased appetite?: 0 Malnutrition Screening Tool Score: 0    Physical Exam  Physical Exam Constitutional:      Appearance: She is not toxic-appearing or diaphoretic.  HENT:     Nose: No congestion.  Cardiovascular:  Rate and Rhythm: Normal rate.  Pulmonary:     Effort: No respiratory distress.  Chest:     Chest wall: No tenderness.  Neurological:     Mental Status: She is alert and oriented to person, place, and time.  Psychiatric:        Attention and Perception: Attention and perception normal.        Mood and Affect: Mood is anxious.        Speech: Speech normal.        Behavior: Behavior is cooperative.        Thought Content:  Thought content normal.    Review of Systems  Constitutional:  Negative for chills, diaphoresis and fever.  HENT:  Negative for congestion.   Eyes:  Negative for discharge.  Respiratory:  Negative for cough, shortness of breath and wheezing.   Cardiovascular:  Negative for palpitations.  Gastrointestinal:  Negative for diarrhea, nausea and vomiting.  Neurological:  Negative for dizziness, seizures, loss of consciousness, weakness and headaches.  Psychiatric/Behavioral:  The patient is nervous/anxious.    Blood pressure (!) 111/57, pulse 78, temperature 98.3 F (36.8 C), temperature source Oral, resp. rate 16, SpO2 97%. There is no height or weight on file to calculate BMI.  Demographic Factors:  Caucasian  Loss Factors: NA  Historical Factors: NA  Risk Reduction Factors:   Positive social support, Positive therapeutic relationship, and Positive coping skills or problem solving skills  Continued Clinical Symptoms:  More than one psychiatric diagnosis  Cognitive Features That Contribute To Risk:  Closed-mindedness    Suicide Risk:  Minimal: No identifiable suicidal ideation.  Patients presenting with no risk factors but with morbid ruminations; may be classified as minimal risk based on the severity of the depressive symptoms  Plan Of Care/Follow-up recommendations:  Activity:  As tolerated  Disposition: Pt discharged AMA in stable condition. [facility] outpatient resources offered.   Thurman LULLA Ivans, NP 06/04/2024, 5:17 AM

## 2024-06-04 NOTE — ED Notes (Signed)
Patient leaving hospital against medical advice

## 2024-06-04 NOTE — Discharge Instructions (Signed)

## 2024-06-08 ENCOUNTER — Ambulatory Visit
Admission: EM | Admit: 2024-06-08 | Discharge: 2024-06-08 | Disposition: A | Discharge status: Home / Self Care | Attending: Family Medicine | Admitting: Family Medicine

## 2024-06-08 DIAGNOSIS — R45851 Suicidal ideations: Secondary | ICD-10-CM

## 2024-06-08 DIAGNOSIS — Z7289 Other problems related to lifestyle: Secondary | ICD-10-CM

## 2024-06-08 DIAGNOSIS — S41111A Laceration without foreign body of right upper arm, initial encounter: Secondary | ICD-10-CM | POA: Diagnosis not present

## 2024-06-08 NOTE — ED Triage Notes (Signed)
 Pt reports discomfort in the left arm after she self injured today.

## 2024-06-08 NOTE — ED Notes (Signed)
 Pt states she thought of killing herself this month and states she has a plan, did not told me how or gave me more information.

## 2024-06-08 NOTE — Discharge Instructions (Signed)
 I placed a referral for you to be seen by a behavioral health provider through [facility] health.  Please call them and schedule the surgeon appointment as soon as possible.  Continue taking your medication for now.  If you get the sense of wanting to hurt yourself or injure life again please present to the emergency room at [facility].   WOUND CARE Please return in 10-14 days to have your stitches/staples removed or sooner if you have concerns.  Keep area clean and dry for 24 hours. Do not remove bandage, if applied.  After 24 hours, remove bandage and wash wound gently with mild soap and warm water. Reapply a new bandage after cleaning wound, if directed.  Continue daily cleansing with soap and water until stitches/staples are removed.  Do not apply any ointments or creams to the wound while stitches/staples are in place, as this may cause delayed healing.  Notify the office if you experience any of the following signs of infection: Swelling, redness, pus drainage, streaking, fever >101.0 F  Notify the office if you experience excessive bleeding that does not stop after 15-20 minutes of constant, firm pressure.

## 2024-06-08 NOTE — ED Provider Notes (Signed)
 [facility] URGENT [facility]ARE CENTER  Note:  This document was prepared using Conservation officer, historic buildings and may include unintentional dictation errors.  MRN: [id number] DOB: 1986-07-12  Subjective:   Rebekah Davis is a 38 y.o. female presenting for self-inflicted injuries to the right arm.  Patient has longstanding history of mental health conditions including PTSD, depression, anxiety, borderline personality disorder.  She is previously had passive suicidal ideation and self mutilating behavior.  Has not had this for some time until recently.  Reports that she had a significant amount of stressors in her life including losing her job, having difficulty establishing care for her mental health.  Recently established with a mental health nurse practitioner in [facility].  Reports that it is difficult to travel to her appointments.  Has been on Lexapro  for 4 weeks.  Was also recently started on Abilify .  She has previously used these treatments and therefore is familiar with them.  Unfortunately, she has now had an potions to start cutting again.  Has used razor blades to her arms.  Has passive suicidal thoughts for instance that she wants to drive into a tree.  Last incidence of this was yesterday.  Denies having suicidal thoughts today.  Unfortunately, she did cut her right arm using razor blades today.  Reports that she discarded them there after.  No current facility-administered medications for this encounter.  Current Outpatient Medications:    acetaminophen  (TYLENOL ) 500 MG tablet, Take 1,000 mg by mouth every 8 (eight) hours., Disp: , Rfl:    ARIPiprazole  (ABILIFY ) 5 MG tablet, Take 1 tablet (5 mg total) by mouth every morning., Disp: 30 tablet, Rfl: 0   ergocalciferol  (VITAMIN D2) 1.25 MG (50000 UT) capsule, Take 1 capsule (50,000 Units total) by mouth once a week. (Patient taking differently: Take 50,000 Units by mouth every Friday.), Disp: 4 capsule, Rfl: 2   escitalopram  (LEXAPRO )  10 MG tablet, Take 1 tablet (10 mg total) by mouth at bedtime., Disp: 30 tablet, Rfl: 0   hydrOXYzine  (ATARAX ) 25 MG tablet, Take 1 tablet (25 mg total) by mouth 3 (three) times daily as needed for anxiety., Disp: 30 tablet, Rfl: 0   ibuprofen  (ADVIL ) 600 MG tablet, Take 600 mg by mouth 4 (four) times daily., Disp: , Rfl:    Ivermectin  1 % CREA, Apply 1 Application topically at bedtime., Disp: 45 g, Rfl: 1   levonorgestrel (MIRENA, 52 MG,) 20 MCG/DAY IUD, 1 each by Intrauterine route once., Disp: , Rfl:    LORazepam  (ATIVAN ) 0.5 MG tablet, Take 1-2 tablets (0.5-1 mg total) by mouth every 4-6 hours as needed for severe panic attacks. (Patient not taking: Reported on 04/09/2024), Disp: 10 tablet, Rfl: 2   LORazepam  (ATIVAN ) 1 MG tablet, Take 1 mg by mouth at bedtime., Disp: , Rfl:    methocarbamol  (ROBAXIN ) 500 MG tablet, Take 500 mg by mouth 2 (two) times daily as needed for muscle spasms., Disp: , Rfl:    ondansetron  (ZOFRAN ) 4 MG tablet, Take 4 mg by mouth every 8 (eight) hours as needed for nausea or vomiting., Disp: , Rfl:    polyethylene glycol powder (GLYCOLAX /MIRALAX ) 17 GM/SCOOP powder, Take 17 g by mouth daily as needed for mild constipation or moderate constipation., Disp: , Rfl:    sennosides-docusate sodium  (SENOKOT-S) 8.6-50 MG tablet, Take 1 tablet by mouth daily., Disp: , Rfl:    Allergies  Allergen Reactions   Adhesive [Tape] Other (See Comments)    blister   Cherry Anaphylaxis and Rash  Sulfamethoxazole-Trimethoprim Hives   Ibuprofen      Other Reaction(s): edema in hands and feet    Pt reports she is not allergic to ibuprofen    Levofloxacin Other (See Comments)    Muscle Pain   Morphine Other (See Comments)    Feels like unable to breath or swallow    Tizanidine Diarrhea   Gabapentin Rash   Metformin Nausea And Vomiting and Other (See Comments)    Diaphoresis    Sulfa Antibiotics Hives and Rash   Sumatriptan Diarrhea, Nausea And Vomiting and Other (See Comments)     Decreased heart rate and respiratory rate      Past Medical History:  Diagnosis Date   Abdominal pain, suprapubic 06/04/2016   Abnormal Pap smear of cervix    Acute cystitis 03/10/2018   Anxiety    Arthritis    C. difficile diarrhea 06/12/2018   Cataract    Chest pain 03/28/2018   Depression    Depression with suicidal ideation 03/09/2018   Diarrhea 06/13/2018   GERD (gastroesophageal reflux disease)    History of kidney stones    Hypomagnesemia 06/13/2018   Kidney stone    Low back pain 07/22/2012   Migraine    Non-suicidal self harm as coping mechanism (HCC) 12/25/2019   Oral thrush 06/12/2018   Ovarian cyst    PCOS (polycystic ovarian syndrome)    PTSD (post-traumatic stress disorder)    Scarlet fever    Sleep apnea    Substance abuse (HCC)    Suicidal ideation 04/12/2018     Past Surgical History:  Procedure Laterality Date   BILATERAL SALPINGOECTOMY     LUMBAR LAMINECTOMY     RIGHT OOPHERECTOMY Right    TENOLYSIS Left 10/04/2020   Procedure: LEFT ACHILLES TENDON DEBRIDEMENT AND RECONSTRUCTION, CALCANEAL EXOSTECTOMY, GASTROCNEMIUS RECESSION,  FLEXOR HALLUCIS LONGUS TRANSFER;  Surgeon: Elsa Lonni SAUNDERS, MD;  Location: [facility] OR;  Service: Orthopedics;  Laterality: Left;  LENGTH OF SURGERY: 1.5 HOURS   TONSILLECTOMY      Family History  Problem Relation Age of Onset   Thyroid  disease Mother    Hypertension Mother    Diabetes Mother    Kidney disease Mother    Depression Mother    Hypertension Father    Diabetes Father    Ovarian cancer Maternal Grandmother    Other Maternal Grandmother        spine cancer   Heart disease Paternal Grandmother    Stroke Paternal Grandfather     Social History   Tobacco Use   Smoking status: Former    Current packs/day: 0.00    Average packs/day: 1 pack/day for 10.0 years (10.0 ttl pk-yrs)    Types: Cigarettes    Start date: 03/2009    Quit date: 03/2019    Years since quitting: 5.2   Smokeless tobacco: Never   Vaping Use   Vaping status: Never Used  Substance Use Topics   Alcohol use: Not Currently   Drug use: Not Currently    ROS   Objective:   Vitals: Pulse (!) 125   Temp 98.1 F (36.7 C) (Oral)   Resp 18   SpO2 98%   Physical Exam Constitutional:      General: She is not in acute distress.    Appearance: Normal appearance. She is well-developed. She is not ill-appearing, toxic-appearing or diaphoretic.  HENT:     Head: Normocephalic and atraumatic.     Nose: Nose normal.     Mouth/Throat:     Mouth:  Mucous membranes are moist.  Eyes:     General: No scleral icterus.       Right eye: No discharge.        Left eye: No discharge.     Extraocular Movements: Extraocular movements intact.  Cardiovascular:     Rate and Rhythm: Normal rate.  Pulmonary:     Effort: Pulmonary effort is normal.  Musculoskeletal:       Arms:  Skin:    General: Skin is warm and dry.  Neurological:     General: No focal deficit present.     Mental Status: She is alert and oriented to person, place, and time.  Psychiatric:        Attention and Perception: Attention and perception normal. She is attentive. She does not perceive auditory or visual hallucinations.        Mood and Affect: Mood is depressed. Mood is not anxious or elated. Affect is labile, flat and tearful. Affect is not blunt, angry or inappropriate.        Speech: She is communicative. Speech is not rapid and pressured, delayed, slurred or tangential.        Behavior: Behavior normal. Behavior is not agitated, slowed, aggressive, withdrawn, hyperactive or combative.        Thought Content: Thought content is not paranoid or delusional. Thought content includes suicidal ideation. Thought content does not include homicidal ideation. Thought content does not include homicidal or suicidal plan.        Cognition and Memory: Cognition is not impaired. Memory is not impaired. She does not exhibit impaired recent memory or impaired remote  memory.        Judgment: Judgment is not impulsive or inappropriate.    PROCEDURE NOTE: laceration repair Verbal consent obtained from patient.  Local anesthesia with 20cc Lidocaine  2% with epinephrine.  Wound explored for tendon, ligament damage. Wound scrubbed with soap and water and rinsed. Wound closed with 4-0 Ethilon (simple interrupted and horizontal mattress) sutures.  Wound cleansed and dressed.   Assessment and Plan :   PDMP not reviewed this encounter.  1. Self mutilating behavior   2. Suicidal ideation   3. Arm laceration, right, initial encounter    Emphasized that patient continue her current medication regimen.  Placed an urgent referral to behavioral health for management of her mental health including suicidal ideation.  Today in clinic, denies any thoughts or plan to commit suicide.  It is my best clinical estimation that patient is not an immediate threat to commit suicide or harm others.  Reviewed strict ER precautions.  Laceration repair as above.  Return to clinic in 10 to 14 days for wound check, consideration for suture removal.  Wound care reviewed.  Counseled patient on potential for adverse effects with medications prescribed/recommended today, ER and return-to-clinic precautions discussed, patient verbalized understanding.    Christopher Savannah, [place] 06/09/24 [phone]

## 2024-06-08 NOTE — ED Notes (Signed)
 Pt is in room 3 in fort of nurse station, door is open, all the cables and objects that pt can use to harm herself were removed from the room as much as possible. Pt did not mention anything at the moment of her arrival. Consulting civil engineer and PA are charge are informed.

## 2024-06-09 ENCOUNTER — Ambulatory Visit (HOSPITAL_COMMUNITY)
Admission: EM | Admit: 2024-06-09 | Discharge: 2024-06-09 | Disposition: A | Discharge status: Home / Self Care | Attending: Psychiatry | Admitting: Psychiatry

## 2024-06-09 DIAGNOSIS — R4589 Other symptoms and signs involving emotional state: Secondary | ICD-10-CM | POA: Diagnosis present

## 2024-06-09 DIAGNOSIS — F603 Borderline personality disorder: Secondary | ICD-10-CM | POA: Diagnosis not present

## 2024-06-09 DIAGNOSIS — F459 Somatoform disorder, unspecified: Secondary | ICD-10-CM | POA: Insufficient documentation

## 2024-06-09 DIAGNOSIS — Z765 Malingerer [conscious simulation]: Secondary | ICD-10-CM | POA: Diagnosis not present

## 2024-06-09 MED ORDER — ALUM & MAG HYDROXIDE-SIMETH 200-200-20 MG/5ML PO SUSP: 30.0000 mL | ORAL | Status: DC | PRN

## 2024-06-09 MED ORDER — ESCITALOPRAM OXALATE 10 MG PO TABS: 10.0000 mg | ORAL_TABLET | Freq: Every day | ORAL | Status: DC

## 2024-06-09 MED ORDER — ESCITALOPRAM OXALATE 10 MG PO TABS
10.0000 mg | ORAL_TABLET | Freq: Every day | ORAL | Status: DC
  Administered 2024-06-09: 10 mg via ORAL
  Filled 2024-06-09: qty 1

## 2024-06-09 MED ORDER — MAGNESIUM HYDROXIDE 400 MG/5ML PO SUSP: 30.0000 mL | Freq: Every day | ORAL | Status: DC | PRN

## 2024-06-09 MED ORDER — ACETAMINOPHEN 325 MG PO TABS
650.0000 mg | ORAL_TABLET | Freq: Four times a day (QID) | ORAL | Status: DC | PRN
  Administered 2024-06-09: 650 mg via ORAL
  Filled 2024-06-09: qty 2

## 2024-06-09 MED ORDER — HYDROXYZINE HCL 25 MG PO TABS
25.0000 mg | ORAL_TABLET | Freq: Three times a day (TID) | ORAL | Status: DC | PRN
  Administered 2024-06-09: 25 mg via ORAL
  Filled 2024-06-09 (×2): qty 1

## 2024-06-09 MED ORDER — LORAZEPAM 0.5 MG PO TABS
0.5000 mg | ORAL_TABLET | ORAL | Status: DC | PRN
  Administered 2024-06-09: 0.5 mg via ORAL
  Filled 2024-06-09: qty 1

## 2024-06-09 MED ORDER — OLANZAPINE 10 MG IM SOLR: 5.0000 mg | Freq: Three times a day (TID) | INTRAMUSCULAR | Status: DC | PRN

## 2024-06-09 MED ORDER — ARIPIPRAZOLE 10 MG PO TABS
10.0000 mg | ORAL_TABLET | Freq: Every day | ORAL | Status: DC
Start: 2024-06-09 — End: 2024-06-09
  Administered 2024-06-09: 10 mg via ORAL
  Filled 2024-06-09: qty 1

## 2024-06-09 MED ORDER — HALOPERIDOL 5 MG PO TABS: 5.0000 mg | ORAL_TABLET | Freq: Three times a day (TID) | ORAL | Status: DC | PRN

## 2024-06-09 MED ORDER — LORAZEPAM 0.5 MG PO TABS: 0.5000 mg | ORAL_TABLET | ORAL | Status: DC

## 2024-06-09 MED ORDER — DIPHENHYDRAMINE HCL 50 MG PO CAPS: 50.0000 mg | ORAL_CAPSULE | Freq: Three times a day (TID) | ORAL | Status: DC | PRN

## 2024-06-09 MED ORDER — OLANZAPINE 10 MG IM SOLR: 10.0000 mg | Freq: Three times a day (TID) | INTRAMUSCULAR | Status: DC | PRN

## 2024-06-09 NOTE — Discharge Instructions (Signed)
 Follow-up recommendations:  Activity:  Normal, as tolerated Diet:  Per PCP recommendation  Patient is instructed prior to discharge to:  Take all medications as prescribed by mental healthcare provider. Report any adverse effects and/or reactions from the medicines to outpatient provider promptly. To not engage in substance use while on psychiatric medicines.  In the event of worsening symptoms, patient is instructed to call the crisis hotline at 988, 911, or go to the nearest ED for appropriate evaluation and treatment of symptoms. To follow-up with primary care provider for your other medical issues, concerns and, or healthcare needs.
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
## 2024-06-09 NOTE — ED Notes (Signed)
 PRN Atarax given due to patient reports of anxiety. Medication administered with no complications. Environment secured, safety checks in place per facility policy.

## 2024-06-09 NOTE — ED Provider Notes (Signed)
 FBC/OBS ASAP Discharge Summary  Date and Time: 06/09/2024 8:30 PM  Name: Rebekah Davis  MRN:  [id number]   Discharge Diagnoses:  Final diagnoses:  Borderline personality disorder (HCC)  Malingering  Somatoform disorder   HPI: Rebekah Davis is a 38 y.o. female w/ hx of borderline personality disorder, PTSD, panic disorder who presents with nonsuicidal self harm and thoughts to cut herself.  Per [facility] interdisciplinary meeting, Pt has been referred to inpatient hospitals in past and not been accepted for various reasons. Pt has done well historically with community resources in these self harm and suicidal thought presentations.   Subjective:  Rebekah Davis reports that she self harmed prior to presentation to point that she got significant laceration. Said she had to get stitches. Reports that hse is worried for her safety if going home but because she thinks she will self harm again and NOT b/c she is having suicidal thoughts. She is regretful that her current laceration required stitches and says that she does not like self harming. Reports she recently saw her outpatient provider for med changes. Reports that she does not have a therapist. Says that she had an appointment to see her medical provider today but it was at 9 am. Says that she is open to PHP to allow her to have intensive therapy prior to getting individual therapist.  Rebekah Davis denies any access to firearms.  Rebekah Davis says that she was comfortable getting to [facility] appointment. Rebekah Davis does not report having any social support or anyone who can participate in safety plan for her. She vocalizes understanding that PHP does not start immediately and that she will need to go to all of the sessions. She vocalizes that she will call 911 with any acute suicidal thoughts. Thoughts interview Rebekah Davis is inquiring about her psychologic issues including a diagnosis of DID.   Per interdisciplinary meeting with [facility] psychiatry leadership, Rebekah Davis has  long history of self harming and presenting to ED. reports that Rebekah Davis has been recommended for inpatient the past and has never been accepted to an inpatient hospital on numerous occasions, given her exclusionary criteria including medical comorbities and mobility issues and underlying borderline personality disorder.  Reported that Rebekah Davis has been recommended for DBT in the past given her borderline personality disorder.  Stay Summary: Rebekah Davis endorsed thoughts of wanting to self-harm but denies any thoughts of suicide.  PHP accepted Rebekah Davis.  Pt was not faxed out given lack of suicidal thoughts. Pt did not meet IVC criteria. Pt was appropriate on unit.   Total Time spent with Rebekah Davis: 30 minutes  Past Psychiatric History: borderline personality disorder, PTSD, panic disorder Past Medical History: PCOS, hx scarlet feer, L partial achilles tear, chronic back pain from numerous spine conditions and surgeries Family History: family history includes Depression in her mother; Diabetes in her father and mother; Heart disease in her paternal grandmother; Hypertension in her father and mother; Kidney disease in her mother; Other in her maternal grandmother; Ovarian cancer in her maternal grandmother; Stroke in her paternal grandfather; Thyroid  disease in her mother. Family Psychiatric History: no pertinent Social History: Rebekah Davis lives by herself in an apartment.  Rebekah Davis is not current employed but receives disability.  Denies any substance use. Tobacco Cessation:  N/A, Rebekah Davis does not currently use tobacco products  Current Medications:  No current facility-administered medications for this encounter.   Current Outpatient Medications  Medication Sig Dispense Refill   ARIPiprazole  (ABILIFY ) 10 MG tablet Take 10 mg by mouth daily.  escitalopram  (LEXAPRO ) 10 MG tablet Take 1 tablet (10 mg total) by mouth at bedtime. (Rebekah Davis taking differently: Take 10 mg by mouth in the morning.) 30 tablet 0    hydrOXYzine  (ATARAX ) 25 MG tablet Take 1 tablet (25 mg total) by mouth 3 (three) times daily as needed for anxiety. 30 tablet 0   LORazepam  (ATIVAN ) 0.5 MG tablet Take 1-2 tablets (0.5-1 mg total) by mouth every 4-6 hours as needed for severe panic attacks. 10 tablet 2   methocarbamol  (ROBAXIN ) 500 MG tablet Take 500 mg by mouth 2 (two) times daily as needed for muscle spasms.     neomycin -bacitracin -polymyxin (NEOSPORIN) OINT Apply 1 Application topically as needed for wound care.     polyethylene glycol powder (GLYCOLAX /MIRALAX ) 17 GM/SCOOP powder Take 17 g by mouth daily as needed for mild constipation or moderate constipation.     Scar Treatment Products [facility] ADVANCED SCAR GEL EX) Apply 1 Application topically daily.     sennosides-docusate sodium  (SENOKOT-S) 8.6-50 MG tablet Take 1 tablet by mouth as needed for constipation.     ergocalciferol  (VITAMIN D2) 1.25 MG (50000 UT) capsule Take 1 capsule (50,000 Units total) by mouth once a week. (Rebekah Davis taking differently: Take 50,000 Units by mouth once a week. On Fridays) 4 capsule 2   levonorgestrel (MIRENA, 52 MG,) 20 MCG/DAY IUD 1 each by Intrauterine route once.      PTA Medications:  PTA Medications  Medication Sig   LORazepam  (ATIVAN ) 0.5 MG tablet Take 1-2 tablets (0.5-1 mg total) by mouth every 4-6 hours as needed for severe panic attacks.   methocarbamol  (ROBAXIN ) 500 MG tablet Take 500 mg by mouth 2 (two) times daily as needed for muscle spasms.   polyethylene glycol powder (GLYCOLAX /MIRALAX ) 17 GM/SCOOP powder Take 17 g by mouth daily as needed for mild constipation or moderate constipation.   sennosides-docusate sodium  (SENOKOT-S) 8.6-50 MG tablet Take 1 tablet by mouth as needed for constipation.   escitalopram  (LEXAPRO ) 10 MG tablet Take 1 tablet (10 mg total) by mouth at bedtime. (Rebekah Davis taking differently: Take 10 mg by mouth in the morning.)   hydrOXYzine  (ATARAX ) 25 MG tablet Take 1 tablet (25 mg total) by mouth 3 (three)  times daily as needed for anxiety.   ARIPiprazole  (ABILIFY ) 10 MG tablet Take 10 mg by mouth daily.   ergocalciferol  (VITAMIN D2) 1.25 MG (50000 UT) capsule Take 1 capsule (50,000 Units total) by mouth once a week. (Rebekah Davis taking differently: Take 50,000 Units by mouth once a week. On Fridays)   levonorgestrel (MIRENA, 52 MG,) 20 MCG/DAY IUD 1 each by Intrauterine route once.       02/06/2023    3:34 PM 10/26/2022    3:18 PM 07/30/2022    4:46 PM  Depression screen PHQ 2/9  Decreased Interest 0 3 2  Down, Depressed, Hopeless 0 3 2  PHQ - 2 Score 0 6 4  Altered sleeping 0 3   Tired, decreased energy 0 3   Change in appetite 0 2   Feeling bad or failure about yourself  0 2   Trouble concentrating 0 3   Moving slowly or fidgety/restless 0 0   Suicidal thoughts 0 1   PHQ-9 Score 0 20   Difficult doing work/chores Not difficult at all Very difficult Very difficult    Flowsheet Row ED from 06/09/2024 in [facility] from 06[facility]08/2024 in [facility] ED from 06/03/2024 in [facility]  C-SSRS RISK CATEGORY Moderate Risk Error: Question 6 not populated Low Risk    Musculoskeletal  Strength & Muscle Tone: within normal limits Gait & Station: normal Rebekah Davis leans: N/A  Psychiatric Specialty Exam  Presentation  General Appearance:  Casual  Eye Contact: Good  Speech: Clear and Coherent  Speech Volume: Normal  Handedness: Right   Mood and Affect  Mood: Anxious  Affect: Congruent   Thought Process  Thought Processes: Coherent  Descriptions of Associations:Intact  Orientation:Full (Time, Place and Person)  Thought Content: ruminating on self harm but w/o SI. Self harm is ego dystonic. Pt is inquiring about DID.   Diagnosis of Schizophrenia or Schizoaffective disorder in past: No  Duration of Psychotic Symptoms: Greater than six months    Hallucinations:Hallucinations: None  Ideas of Reference:None  Suicidal Thoughts: denies Homicidal Thoughts:Homicidal Thoughts: No   Sensorium  Memory: Immediate Fair  Judgment: Poor  Insight: Lacking   Executive Functions  Concentration: Fair  Attention Span: Fair  Recall: Fair  Fund of Knowledge: Fair  Language: Fair   Psychomotor Activity  Psychomotor Activity: Psychomotor Activity: Normal   Assets  Assets: Desire for Improvement; Resilience; Vocational/Educational   Sleep  Sleep: Sleep: Fair  No Safety Checks orders active in given range  Nutritional Assessment (For OBS and FBC admissions only) Has the Rebekah Davis had a weight loss or gain of 10 pounds or more in the last 3 months?: No Has the Rebekah Davis had a decrease in food intake/or appetite?: No Does the Rebekah Davis have dental problems?: No Does the Rebekah Davis have eating habits or behaviors that may be indicators of an eating disorder including binging or inducing vomiting?: No Has the Rebekah Davis recently lost weight without trying?: 0 Has the Rebekah Davis been eating poorly because of a decreased appetite?: 0 Malnutrition Screening Tool Score: 0    Physical Exam  Physical Exam Vitals and nursing note reviewed.  Constitutional:      General: She is not in acute distress. HENT:     Head: Normocephalic and atraumatic.  Pulmonary:     Effort: Pulmonary effort is normal.  Musculoskeletal:     Comments: Wearing removable ortho boot on left foot.   Skin:    Comments: Lacerations on R forearm which are well healed without signs of infection or dehiscence. See image.   Neurological:     General: No focal deficit present.     Mental Status: She is alert.  Psychiatric:     Comments: No eps.      Review of Systems  Constitutional:  Negative for fever.  Cardiovascular:  Negative for chest pain and palpitations.  Gastrointestinal:  Negative for constipation, diarrhea, nausea and vomiting.   Musculoskeletal:  Positive for joint pain.  Neurological:  Negative for dizziness, weakness and headaches.  Psychiatric/Behavioral:         Pt denies extrapyramidal symptoms including dystonia (sudden spastic contractions of muscle groups), parkinsonism (bradykinesia, tremors, rigidity), and akathisia (severe restlessness).    Blood pressure 109/73, pulse 80, temperature 98.4 F (36.9 C), temperature source Oral, resp. rate 18, SpO2 100%. There is no height or weight on file to calculate BMI.  Demographic Factors:  Caucasian, Living alone, and Unemployed  Loss Factors: NA  Historical Factors: NA  Risk Reduction Factors:   Close follow up with PHP and frequent contact with outpatient providers.   Continued Clinical Symptoms:  Continued dysthymia and ruminations of self harm but without SI. Long documented hx of borderline personality traits including non suicidal self injurious  behaviors  Cognitive Features That Contribute To Risk:  None    Suicide Risk:  Mild:  Suicidal ideation of limited frequency, intensity, duration, and specificity prior to admission.  There are no identifiable plans, no associated intent, mild dysphoria and related symptoms, good self-control (both objective and subjective assessment), few other risk factors. Rebekah Davis has long standing hx of nonsuicidal self injurious behavior. Rebekah Davis set up with partial hospitalization program. Pt is future oriented to program and attending future medical appointments. Motivated to start therapy.   Plan Of Care/Follow-up recommendations:  Sulma Ruffino is a 38 y.o. female with longstanding history of borderline personality traits, recurrent nonsuicidal self-harm by cutting, and numerous presentations Emergency Department for self harm who presents with self-harm resulting in laceration requiring repair.  Rebekah Davis reports some brief passive SI and self-harming to point of causing significant laceration but denies any suicidal  thoughts including a plan to end life, intent to end life, or wishing she was dead at time of cutting.  Rebekah Davis wanting support with therapy but is on a wait list, so Rebekah Davis was offered PHP which she was agreeable to.  Rebekah Davis verbalizes that she needs to follow up with PHP, attending all sessions for several weeks, understands that PHP will not start immediately, and to call 911 with any suicidal thoughts.  Rebekah Davis did have a recent increase in Abilify  per outpatient provider and was not displaying any side effects including akathisia on my assessment.  Recommend Rebekah Davis continue follow-up with outpatient psychiatrist and do not feel that there are any medication changes at this time which could provide acute benefit for Rebekah Davis.  Rebekah Davis does not have any close social support, so we are unable to appropriately safety plan with another person.  Rebekah Davis was considered for inpatient but has been declined extensively in the past when faxed out for inpatient hospitalizations due to exclusionary criteria including medical comorbidities, mobility issues, and documented personality disorder.  Rebekah Davis has been recommended for DBT in the past but does not appear to have started a DBT group. DBT would be an excellent next step as a step down after PHP. Of note Rebekah Davis thinks she has DID but on history she is able to remember info from her different lives which does not appear to fit this severe, rare diagnosis. If she does have DID, our assessment can only represent the current Rebekah Davis who is being interviewed and not her other identities which we are unable to assess.   Rebekah Davis denies SI/HI/AVH or paranoia. Rebekah Davis does not meet inpatient psychiatric admission criteria or IVC criteria at this time. There is no evidence of imminent risk of harm to self or others.  Discharge recommendations: . Please follow up with your primary care provider for all medical related needs.  Safety: The Rebekah Davis should abstain from  use of illicit substances/drugs and abuse of any medications.  If symptoms worsen or do not continue to improve or if the Rebekah Davis becomes actively suicidal or homicidal then it is recommended that the Rebekah Davis return to the closest hospital emergency department, the [facility], or call 911 for further evaluation and treatment.  National Suicide Prevention Lifeline 1-800-SUICIDE or 7262993296.  About 988 988 offers 24/7 access to trained crisis counselors who can help people experiencing mental health-related distress. People can call or text 988 or chat 988lifeline.org for themselves or if they are worried about a loved one who may need crisis support.  Crisis Mobile: [facility]: 867-473-0107 (for crisis response 24 hours a day)  [facility] Hotline: (856)870-7766  Rebekah Davis discharged home in stable condition.   Disposition: home with self, partial hospitalization program  Justino Cornish, MD 06/09/2024, 8:30 PM

## 2024-06-09 NOTE — ED Notes (Signed)
 Patient resting in lounger with eyes closed, respirations even and unlabored. Patient in no apparent acute distress. Environment secured. Safety checks in place per facility protocol.

## 2024-06-09 NOTE — ED Notes (Signed)
 Writer checked boot for contraband as previous shift report they were not able to do so. No contraband found. L foot assessed, per patient she previously tore her achilles and had it repaired but has since re-torn the tendon and has been recommended for an additional repair. Site puffy and tender. Pain rated at site as an 8/10. PRN tylenol  given as well as a cold pack. Patient currently resting with foot elevated with cold pack in place. Environment secured, safety checks in place per facility policy.

## 2024-06-09 NOTE — ED Provider Notes (Signed)
 [facility] Urgent Care Continuous Assessment Admission H&P  Date: 06/09/24 Patient Name: Rebekah Davis MRN: [id number] Chief Complaint: thought of wanting to cut herself   Diagnoses:  Final diagnoses:  Borderline personality disorder (HCC)  Malingering  Somatoform disorder    HPI: Rebekah Davis, 38 y/o female with history of borderline personality disorder, PTSD, panic disorder, suicidal ideation.  Presented to [facility], accompanied by a friend (brady Pylance).  I review of patient records show that patient was just seen in one of the urgent cares a couple hours ago.  Per the patient she has the urge to cut, self.  Patient reports she use a razor to cut her lower arm. She does see a med provider at [facility].  According to patient she lives alone.  According to her she is compliant with her medications.  Copied from triage notes: Pt presents to [facility] as a voluntary walk-in, accompanied by her friend with complaint of increasing thoughts/compulsion to cut. Pt was seen at Urgent Care yesterday for self-injurious behaviors. Pt reports using a razor blade to cut her right lower forearm. 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 Pt reports confusion and feels that she is losing time. Sheis established with psychiatric NP [name]) at [facility]. Pt denies outpatient therapy, but has made attempts to seek services and is waitlisted at this time. Pt denies SI,HI,AVH substance/alcohol use. Pt relies on her friend to provide information. Pt says that hsi does not feel suicical but that she cannot stop hersefl from cutting. Pt does not have voices that tell her to cut she says it is just me. Pt talks to herslf and says no a lot as if responding to internal stimuli. Pt says she has 6 different personalities.   Face-to-face evaluation of patient, patient is alert and oriented x 4, speech is clear maintain eye contact.  Patient does not appear to be in any immediate distress.  Does not seem to be influenced by internal  stimuli.  Patient when asked if she is suicidal stated no but she feels like cutting herself.  Patient denies HI, AVH or paranoia.  Patient denies alcohol use denies illicit drug use at this time.  Denies smoking.  Patient stated that she is looking for inpatient admissions.    I review of patient last visit show the patient was admitted and then she left AMA, because she wanted to be given Ativan . Will not draw any blood work tonight because patient had blood work on 06/03/24  Given patient prior history and current presentation, we will admit patient for observation.    Total Time spent with patient: 20 minutes  Musculoskeletal  Strength & Muscle Tone: within normal limits Gait & Station: normal Patient leans: N/A  Psychiatric Specialty Exam  Presentation General Appearance:  Casual  Eye Contact: Good  Speech: Clear and Coherent  Speech Volume: Normal  Handedness: Right   Mood and Affect  Mood: Anxious  Affect: Congruent   Thought Process  Thought Processes: Coherent  Descriptions of Associations:Intact  Orientation:Full (Time, Place and Person)  Thought Content:WDL  Diagnosis of Schizophrenia or Schizoaffective disorder in past: No   Hallucinations:Hallucinations: None  Ideas of Reference:None  Suicidal Thoughts:Suicidal Thoughts: Yes, Passive SI Active Intent and/or Plan: Without Intent; Without Plan  Homicidal Thoughts:Homicidal Thoughts: No   Sensorium  Memory: Immediate Fair  Judgment: Poor  Insight: Lacking   Executive Functions  Concentration: Fair  Attention Span: Fair  Recall: [employer] of Knowledge: Fair  Language: Fair   Psychomotor  Activity  Psychomotor Activity: Psychomotor Activity: Normal   Assets  Assets: Desire for Improvement; Resilience; Vocational/Educational   Sleep  Sleep: Sleep: Fair Number of Hours of Sleep: 4   Nutritional Assessment (For OBS and FBC admissions only) Has the patient  had a weight loss or gain of 10 pounds or more in the last 3 months?: No Has the patient had a decrease in food intake/or appetite?: No Does the patient have dental problems?: No Does the patient have eating habits or behaviors that may be indicators of an eating disorder including binging or inducing vomiting?: No Has the patient recently lost weight without trying?: 0 Has the patient been eating poorly because of a decreased appetite?: 0 Malnutrition Screening Tool Score: 0    Physical Exam HENT:     Head: Normocephalic.     Nose: Nose normal.  Eyes:     Pupils: Pupils are equal, round, and reactive to light.  Cardiovascular:     Rate and Rhythm: Normal rate.  Pulmonary:     Effort: Pulmonary effort is normal.  Musculoskeletal:     Cervical back: Normal range of motion.  Skin:    General: Skin is warm.  Neurological:     General: No focal deficit present.     Mental Status: She is alert.  Psychiatric:        Mood and Affect: Mood normal.        Thought Content: Thought content normal.    Review of Systems  Constitutional: Negative.   HENT: Negative.    Eyes: Negative.   Respiratory: Negative.    Cardiovascular: Negative.   Gastrointestinal: Negative.   Genitourinary: Negative.   Musculoskeletal: Negative.   Neurological: Negative.   Psychiatric/Behavioral:  Positive for suicidal ideas. The patient is nervous/anxious.     Blood pressure 135/84, pulse (!) 101, temperature 98.5 F (36.9 C), temperature source Oral, resp. rate 20, SpO2 98%. There is no height or weight on file to calculate BMI.  Past Psychiatric History: Borderline personality disorder, PTSD, GAD, DID  Is the patient at risk to self? Yes  Has the patient been a risk to self in the past 6 months? Yes .    Has the patient been a risk to self within the distant past? Yes   Is the patient a risk to others? No   Has the patient been a risk to others in the past 6 months? No   Has the patient been a  risk to others within the distant past? No   Past Medical History: See chart  Family History: Unknown  Social History: Denies  Last Labs:  Admission on 06/03/2024, Discharged on 06/04/2024  Component Date Value Ref Range Status   WBC 06/03/2024 14.1 (H)  4.0 - 10.5 K/uL Final   RBC 06/03/2024 4.86  3.87 - 5.11 MIL/uL Final   Hemoglobin 06/03/2024 15.1 (H)  12.0 - 15.0 g/dL Final   HCT 91/79/7974 44.3  36.0 - 46.0 % Final   MCV 06/03/2024 91.2  80.0 - 100.0 fL Final   MCH 06/03/2024 31.1  26.0 - 34.0 pg Final   MCHC 06/03/2024 34.1  30.0 - 36.0 g/dL Final   RDW 91/79/7974 12.5  11.5 - 15.5 % Final   Platelets 06/03/2024 308  150 - 400 K/uL Final   nRBC 06/03/2024 0.0  0.0 - 0.2 % Final   Neutrophils Relative % 06/03/2024 71  % Final   Neutro Abs 06/03/2024 10.1 (H)  1.7 - 7.7 K/uL Final  Lymphocytes Relative 06/03/2024 21  % Final   Lymphs Abs 06/03/2024 3.0  0.7 - 4.0 K/uL Final   Monocytes Relative 06/03/2024 5  % Final   Monocytes Absolute 06/03/2024 0.7  0.1 - 1.0 K/uL Final   Eosinophils Relative 06/03/2024 1  % Final   Eosinophils Absolute 06/03/2024 0.1  0.0 - 0.5 K/uL Final   Basophils Relative 06/03/2024 1  % Final   Basophils Absolute 06/03/2024 0.1  0.0 - 0.1 K/uL Final   Immature Granulocytes 06/03/2024 1  % Final   Abs Immature Granulocytes 06/03/2024 0.09 (H)  0.00 - 0.07 K/uL Final   Performed at [facility], [address], [place], [place] [address]   Sodium 06/03/2024 139  135 - 145 mmol/L Final   Potassium 06/03/2024 4.1  3.5 - 5.1 mmol/L Final   Chloride 06/03/2024 103  98 - 111 mmol/L Final   CO2 06/03/2024 26  22 - 32 mmol/L Final   Glucose, Bld 06/03/2024 80  70 - 99 mg/dL Final   Glucose reference range applies only to samples taken after fasting for at least 8 hours.   BUN 06/03/2024 18  6 - 20 mg/dL Final   Creatinine, Ser 06/03/2024 0.78  0.44 - 1.00 mg/dL Final   Calcium 91/79/7974 9.1  8.9 - 10.3 mg/dL Final   Total Protein 91/79/7974 6.7   6.5 - 8.1 g/dL Final   Albumin 91/79/7974 3.8  3.5 - 5.0 g/dL Final   AST 91/79/7974 22  15 - 41 U/L Final   ALT 06/03/2024 29  0 - 44 U/L Final   Alkaline Phosphatase 06/03/2024 86  38 - 126 U/L Final   Total Bilirubin 06/03/2024 0.5  0.0 - 1.2 mg/dL Final   GFR, Estimated 06/03/2024 >60  >60 mL/min Final   Comment: (NOTE) Calculated using the CKD-EPI Creatinine Equation (2021)    Anion gap 06/03/2024 10  5 - 15 Final   Performed at [facility], [address], [place], [place] [address]   Alcohol, Ethyl (B) 06/03/2024 <15  <15 mg/dL Final   Comment: (NOTE) For medical purposes only. Performed at [facility], [address], [place], [place] [address]    Preg Test, Ur 06/03/2024 Negative  Negative Final   POC Amphetamine UR 06/03/2024 None Detected  NONE DETECTED (Cut Off Level 1000 ng/mL) Final   POC Secobarbital (BAR) 06/03/2024 None Detected  NONE DETECTED (Cut Off Level 300 ng/mL) Final   POC Buprenorphine (BUP) 06/03/2024 None Detected  NONE DETECTED (Cut Off Level 10 ng/mL) Final   POC Oxazepam (BZO) 06/03/2024 None Detected  NONE DETECTED (Cut Off Level 300 ng/mL) Final   POC Cocaine UR 06/03/2024 None Detected  NONE DETECTED (Cut Off Level 300 ng/mL) Final   POC Methamphetamine UR 06/03/2024 None Detected  NONE DETECTED (Cut Off Level 1000 ng/mL) Final   POC Morphine 06/03/2024 None Detected  NONE DETECTED (Cut Off Level 300 ng/mL) Final   POC Methadone UR 06/03/2024 None Detected  NONE DETECTED (Cut Off Level 300 ng/mL) Final   POC Oxycodone  UR 06/03/2024 None Detected  NONE DETECTED (Cut Off Level 100 ng/mL) Final   POC Marijuana UR 06/03/2024 None Detected  NONE DETECTED (Cut Off Level 50 ng/mL) Final  Admission on 04/20/2024, Discharged on 04/21/2024  Component Date Value Ref Range Status   Sodium 04/21/2024 135  135 - 145 mmol/L Final   Potassium 04/21/2024 3.5  3.5 - 5.1 mmol/L Final   Chloride 04/21/2024 101  98 -  111 mmol/L Final   CO2 04/21/2024 24   22 - 32 mmol/L Final   Glucose, Bld 04/21/2024 95  70 - 99 mg/dL Final   Glucose reference range applies only to samples taken after fasting for at least 8 hours.   BUN 04/21/2024 16  6 - 20 mg/dL Final   Creatinine, Ser 04/21/2024 0.85  0.44 - 1.00 mg/dL Final   Calcium 92/91/7974 8.9  8.9 - 10.3 mg/dL Final   Total Protein 92/91/7974 7.1  6.5 - 8.1 g/dL Final   Albumin 92/91/7974 4.2  3.5 - 5.0 g/dL Final   AST 92/91/7974 26  15 - 41 U/L Final   ALT 04/21/2024 35  0 - 44 U/L Final   Alkaline Phosphatase 04/21/2024 73  38 - 126 U/L Final   Total Bilirubin 04/21/2024 0.9  0.0 - 1.2 mg/dL Final   GFR, Estimated 04/21/2024 >60  >60 mL/min Final   Comment: (NOTE) Calculated using the CKD-EPI Creatinine Equation (2021)    Anion gap 04/21/2024 10  5 - 15 Final   Performed at [facility], [address] [place], [place] [address]   Alcohol, Ethyl (B) 04/21/2024 <15  <15 mg/dL Final   Comment: (NOTE) For medical purposes only. Performed at [facility], [address], [place], [place] [address]    WBC 04/21/2024 10.1  4.0 - 10.5 K/uL Final   RBC 04/21/2024 4.40  3.87 - 5.11 MIL/uL Final   Hemoglobin 04/21/2024 13.5  12.0 - 15.0 g/dL Final   HCT 92/91/7974 40.6  36.0 - 46.0 % Final   MCV 04/21/2024 92.3  80.0 - 100.0 fL Final   MCH 04/21/2024 30.7  26.0 - 34.0 pg Final   MCHC 04/21/2024 33.3  30.0 - 36.0 g/dL Final   RDW 92/91/7974 12.4  11.5 - 15.5 % Final   Platelets 04/21/2024 275  150 - 400 K/uL Final   nRBC 04/21/2024 0.0  0.0 - 0.2 % Final   Performed at [facility], [address], [place], [place] [address]   Opiates 04/21/2024 NONE DETECTED  NONE DETECTED Final   Cocaine 04/21/2024 NONE DETECTED  NONE DETECTED Final   Benzodiazepines 04/21/2024 NONE DETECTED  NONE DETECTED Final   Amphetamines 04/21/2024 NONE DETECTED  NONE DETECTED Final   Tetrahydrocannabinol 04/21/2024 NONE DETECTED  NONE DETECTED Final    Barbiturates 04/21/2024 NONE DETECTED  NONE DETECTED Final   Comment: (NOTE) DRUG SCREEN FOR MEDICAL PURPOSES ONLY.  IF CONFIRMATION IS NEEDED FOR ANY PURPOSE, NOTIFY LAB WITHIN 5 DAYS.  LOWEST DETECTABLE LIMITS FOR URINE DRUG SCREEN Drug Class                     Cutoff (ng/mL) Amphetamine and metabolites    1000 Barbiturate and metabolites    200 Benzodiazepine                 200 Opiates and metabolites        300 Cocaine and metabolites        300 THC                            50 Performed at [facility], [address] [place], [place] [address]    Preg, Serum 04/21/2024 NEGATIVE  NEGATIVE Final   Comment:        THE SENSITIVITY OF THIS METHODOLOGY IS >10 mIU/mL. Performed at [facility], [address] [place], [place] [address]   Admission on 04/09/2024, Discharged on 04/10/2024  Component Date Value Ref Range Status   Sodium 04/09/2024 139  135 - 145 mmol/L Final   Potassium 04/09/2024 3.7  3.5 - 5.1 mmol/L Final   Chloride 04/09/2024 103  98 - 111 mmol/L Final   CO2 04/09/2024 24  22 - 32 mmol/L Final   Glucose, Bld 04/09/2024 91  70 - 99 mg/dL Final   Glucose reference range applies only to samples taken after fasting for at least 8 hours.   BUN 04/09/2024 18  6 - 20 mg/dL Final   Creatinine, Ser 04/09/2024 1.02 (H)  0.44 - 1.00 mg/dL Final   Calcium 93/73/7974 9.2  8.9 - 10.3 mg/dL Final   Total Protein 93/73/7974 7.1  6.5 - 8.1 g/dL Final   Albumin 93/73/7974 3.9  3.5 - 5.0 g/dL Final   AST 93/73/7974 30  15 - 41 U/L Final   ALT 04/09/2024 39  0 - 44 U/L Final   Alkaline Phosphatase 04/09/2024 83  38 - 126 U/L Final   Total Bilirubin 04/09/2024 1.2  0.0 - 1.2 mg/dL Final   GFR, Estimated 04/09/2024 >60  >60 mL/min Final   Comment: (NOTE) Calculated using the CKD-EPI Creatinine Equation (2021)    Anion gap 04/09/2024 12  5 - 15 Final   Performed at [facility], [address], [place], [place]  [address]   Alcohol, Ethyl (B) 04/09/2024 <15  <15 mg/dL Final   Comment: (NOTE) For medical purposes only. Performed at [facility], [address] [place], [place] [address]    Opiates 04/09/2024 NONE DETECTED  NONE DETECTED Final   Cocaine 04/09/2024 NONE DETECTED  NONE DETECTED Final   Benzodiazepines 04/09/2024 POSITIVE (A)  NONE DETECTED Final   Amphetamines 04/09/2024 NONE DETECTED  NONE DETECTED Final   Tetrahydrocannabinol 04/09/2024 NONE DETECTED  NONE DETECTED Final   Barbiturates 04/09/2024 NONE DETECTED  NONE DETECTED Final   Comment: (NOTE) DRUG SCREEN FOR MEDICAL PURPOSES ONLY.  IF CONFIRMATION IS NEEDED FOR ANY PURPOSE, NOTIFY LAB WITHIN 5 DAYS.  LOWEST DETECTABLE LIMITS FOR URINE DRUG SCREEN Drug Class                     Cutoff (ng/mL) Amphetamine and metabolites    1000 Barbiturate and metabolites    200 Benzodiazepine                 200 Opiates and metabolites        300 Cocaine and metabolites        300 THC                            50 Performed at [facility], [address], [place], [place] [address]    WBC 04/09/2024 11.3 (H)  4.0 - 10.5 K/uL Final   RBC 04/09/2024 4.72  3.87 - 5.11 MIL/uL Final   Hemoglobin 04/09/2024 14.4  12.0 - 15.0 g/dL Final   HCT 93/73/7974 43.4  36.0 - 46.0 % Final   MCV 04/09/2024 91.9  80.0 - 100.0 fL Final   MCH 04/09/2024 30.5  26.0 - 34.0 pg Final   MCHC 04/09/2024 33.2  30.0 - 36.0 g/dL Final   RDW 93/73/7974 12.2  11.5 - 15.5 % Final   Platelets 04/09/2024 295  150 - 400 K/uL Final   nRBC 04/09/2024 0.0  0.0 -  0.2 % Final   Neutrophils Relative % 04/09/2024 68  % Final   Neutro Abs 04/09/2024 7.6  1.7 - 7.7 K/uL Final   Lymphocytes Relative 04/09/2024 26  % Final   Lymphs Abs 04/09/2024 3.0  0.7 - 4.0 K/uL Final   Monocytes Relative 04/09/2024 5  % Final   Monocytes Absolute 04/09/2024 0.6  0.1 - 1.0 K/uL Final   Eosinophils Relative 04/09/2024 1  % Final   Eosinophils  Absolute 04/09/2024 0.1  0.0 - 0.5 K/uL Final   Basophils Relative 04/09/2024 0  % Final   Basophils Absolute 04/09/2024 0.0  0.0 - 0.1 K/uL Final   Immature Granulocytes 04/09/2024 0  % Final   Abs Immature Granulocytes 04/09/2024 0.04  0.00 - 0.07 K/uL Final   Performed at [facility], [address], [place], [place] [address]   Preg, Serum 04/09/2024 NEGATIVE  NEGATIVE Final   Comment:        THE SENSITIVITY OF THIS METHODOLOGY IS >10 mIU/mL. Performed at [facility], [address], [place], [place] [address]    Color, Urine 04/09/2024 YELLOW  YELLOW Final   APPearance 04/09/2024 HAZY (A)  CLEAR Final   Specific Gravity, Urine 04/09/2024 1.031 (H)  1.005 - 1.030 Final   pH 04/09/2024 5.0  5.0 - 8.0 Final   Glucose, UA 04/09/2024 NEGATIVE  NEGATIVE mg/dL Final   Hgb urine dipstick 04/09/2024 SMALL (A)  NEGATIVE Final   Bilirubin Urine 04/09/2024 NEGATIVE  NEGATIVE Final   Ketones, ur 04/09/2024 NEGATIVE  NEGATIVE mg/dL Final   Protein, ur 93/73/7974 NEGATIVE  NEGATIVE mg/dL Final   Nitrite 93/73/7974 NEGATIVE  NEGATIVE Final   Leukocytes,Ua 04/09/2024 TRACE (A)  NEGATIVE Final   RBC / HPF 04/09/2024 0-5  0 - 5 RBC/hpf Final   WBC, UA 04/09/2024 0-5  0 - 5 WBC/hpf Final   Bacteria, UA 04/09/2024 RARE (A)  NONE SEEN Final   Squamous Epithelial / HPF 04/09/2024 0-5  0 - 5 /HPF Final   Mucus 04/09/2024 PRESENT   Final   Hyaline Casts, UA 04/09/2024 PRESENT   Final   Performed at [facility], [address], [place], [place] [address]   Magnesium  04/09/2024 2.0  1.7 - 2.4 mg/dL Final   Performed at [facility], [address] [place], [place] [address]  Admission on 03/16/2024, Discharged on 03/17/2024  Component Date Value Ref Range Status   WBC 03/16/2024 8.5  4.0 - 10.5 K/uL Final   RBC 03/16/2024 4.44  3.87 - 5.11 MIL/uL Final   Hemoglobin 03/16/2024 13.8  12.0 - 15.0 g/dL Final   HCT 93/97/7974  41.2  36.0 - 46.0 % Final   MCV 03/16/2024 92.8  80.0 - 100.0 fL Final   MCH 03/16/2024 31.1  26.0 - 34.0 pg Final   MCHC 03/16/2024 33.5  30.0 - 36.0 g/dL Final   RDW 93/97/7974 12.6  11.5 - 15.5 % Final   Platelets 03/16/2024 266  150 - 400 K/uL Final   nRBC 03/16/2024 0.0  0.0 - 0.2 % Final   Neutrophils Relative % 03/16/2024 56  % Final   Neutro Abs 03/16/2024 4.8  1.7 - 7.7 K/uL Final   Lymphocytes Relative 03/16/2024 34  % Final   Lymphs Abs 03/16/2024 2.8  0.7 - 4.0 K/uL Final   Monocytes Relative 03/16/2024 7  % Final   Monocytes Absolute 03/16/2024 0.6  0.1 - 1.0 K/uL Final   Eosinophils Relative 03/16/2024 2  %  Final   Eosinophils Absolute 03/16/2024 0.2  0.0 - 0.5 K/uL Final   Basophils Relative 03/16/2024 1  % Final   Basophils Absolute 03/16/2024 0.0  0.0 - 0.1 K/uL Final   Immature Granulocytes 03/16/2024 0  % Final   Abs Immature Granulocytes 03/16/2024 0.03  0.00 - 0.07 K/uL Final   Performed at [facility], [address] [place], [place] [address]   Sodium 03/16/2024 141  135 - 145 mmol/L Final   Potassium 03/16/2024 3.9  3.5 - 5.1 mmol/L Final   Chloride 03/16/2024 107  98 - 111 mmol/L Final   CO2 03/16/2024 25  22 - 32 mmol/L Final   Glucose, Bld 03/16/2024 113 (H)  70 - 99 mg/dL Final   Glucose reference range applies only to samples taken after fasting for at least 8 hours.   BUN 03/16/2024 19  6 - 20 mg/dL Final   Creatinine, Ser 03/16/2024 1.24 (H)  0.44 - 1.00 mg/dL Final   Calcium 93/97/7974 9.2  8.9 - 10.3 mg/dL Final   Total Protein 93/97/7974 5.8 (L)  6.5 - 8.1 g/dL Final   Albumin 93/97/7974 3.5  3.5 - 5.0 g/dL Final   AST 93/97/7974 23  15 - 41 U/L Final   ALT 03/16/2024 39  0 - 44 U/L Final   Alkaline Phosphatase 03/16/2024 76  38 - 126 U/L Final   Total Bilirubin 03/16/2024 0.5  0.0 - 1.2 mg/dL Final   GFR, Estimated 03/16/2024 57 (L)  >60 mL/min Final   Comment: (NOTE) Calculated using the CKD-EPI Creatinine Equation (2021)    Anion gap  03/16/2024 9  5 - 15 Final   Performed at [facility], [address] [place], [place] [address]   Hgb A1c MFr Bld 03/16/2024 4.6 (L)  4.8 - 5.6 % Final   Comment: (NOTE) Diagnosis of Diabetes The following HbA1c ranges recommended by the American Diabetes Association (ADA) may be used as an aid in the diagnosis of diabetes mellitus.  Hemoglobin             Suggested A1C NGSP%              Diagnosis  <5.7                   Non Diabetic  5.7-6.4                Pre-Diabetic  >6.4                   Diabetic  <7.0                   Glycemic control for                       adults with diabetes.     Mean Plasma Glucose 03/16/2024 85.32  mg/dL Final   Performed at [facility], [address], [place], [place] [address]   Cholesterol 03/16/2024 169  0 - 200 mg/dL Final   Triglycerides 93/97/7974 127  <150 mg/dL Final   HDL 93/97/7974 38 (L)  >40 mg/dL Final   Total CHOL/HDL Ratio 03/16/2024 4.4  RATIO Final   VLDL 03/16/2024 25  0 - 40 mg/dL Final   LDL Cholesterol 03/16/2024 106 (H)  0 - 99 mg/dL Final   Comment:        Total Cholesterol/HDL:CHD Risk Coronary Heart Disease Risk Table  Men   Women  1/2 Average Risk   3.4   3.3  Average Risk       5.0   4.4  2 X Average Risk   9.6   7.1  3 X Average Risk  23.4   11.0        Use the calculated Patient Ratio above and the CHD Risk Table to determine the patient's CHD Risk.        ATP III CLASSIFICATION (LDL):  <100     mg/dL   Optimal  899-870  mg/dL   Near or Above                    Optimal  130-159  mg/dL   Borderline  839-810  mg/dL   High  >809     mg/dL   Very High Performed at [facility], [address], [place], [place] [address]    TSH 03/16/2024 2.009  0.350 - 4.500 uIU/mL Final   Comment: Performed by a 3rd Generation assay with a functional sensitivity of <=0.01 uIU/mL. Performed at [facility], [address], [place], [place] [address]   Admission on  02/25/2024, Discharged on 02/26/2024  Component Date Value Ref Range Status   Sodium 02/25/2024 140  135 - 145 mmol/L Final   Potassium 02/25/2024 3.5  3.5 - 5.1 mmol/L Final   Chloride 02/25/2024 104  98 - 111 mmol/L Final   CO2 02/25/2024 22  22 - 32 mmol/L Final   Glucose, Bld 02/25/2024 129 (H)  70 - 99 mg/dL Final   Glucose reference range applies only to samples taken after fasting for at least 8 hours.   BUN 02/25/2024 11  6 - 20 mg/dL Final   Creatinine, Ser 02/25/2024 0.88  0.44 - 1.00 mg/dL Final   Calcium 94/86/7974 9.3  8.9 - 10.3 mg/dL Final   Total Protein 94/86/7974 7.3  6.5 - 8.1 g/dL Final   Albumin 94/86/7974 4.0  3.5 - 5.0 g/dL Final   AST 94/86/7974 29  15 - 41 U/L Final   ALT 02/25/2024 43  0 - 44 U/L Final   Alkaline Phosphatase 02/25/2024 75  38 - 126 U/L Final   Total Bilirubin 02/25/2024 1.0  0.0 - 1.2 mg/dL Final   GFR, Estimated 02/25/2024 >60  >60 mL/min Final   Comment: (NOTE) Calculated using the CKD-EPI Creatinine Equation (2021)    Anion gap 02/25/2024 14  5 - 15 Final   Performed at [facility], [address] [place], [place] [address]   Alcohol, Ethyl (B) 02/25/2024 <15  <15 mg/dL Final   Comment: Please note change in reference range. (NOTE) For medical purposes only. Performed at [facility], [address], [place], [place] [address]    WBC 02/25/2024 11.0 (H)  4.0 - 10.5 K/uL Final   RBC 02/25/2024 5.04  3.87 - 5.11 MIL/uL Final   Hemoglobin 02/25/2024 15.6 (H)  12.0 - 15.0 g/dL Final   HCT 94/86/7974 46.1 (H)  36.0 - 46.0 % Final   MCV 02/25/2024 91.5  80.0 - 100.0 fL Final   MCH 02/25/2024 31.0  26.0 - 34.0 pg Final   MCHC 02/25/2024 33.8  30.0 - 36.0 g/dL Final   RDW 94/86/7974 12.8  11.5 - 15.5 % Final   Platelets 02/25/2024 313  150 - 400 K/uL Final   nRBC 02/25/2024 0.0  0.0 - 0.2 % Final   Performed at [facility], [address], [place], [place] [address]   Opiates 02/25/2024 NONE DETECTED  NONE DETECTED  Final   Cocaine 02/25/2024 NONE DETECTED  NONE DETECTED Final   Benzodiazepines 02/25/2024 NONE DETECTED  NONE DETECTED Final   Amphetamines 02/25/2024 NONE DETECTED  NONE DETECTED Final   Tetrahydrocannabinol 02/25/2024 NONE DETECTED  NONE DETECTED Final   Barbiturates 02/25/2024 NONE DETECTED  NONE DETECTED Final   Comment: (NOTE) DRUG SCREEN FOR MEDICAL PURPOSES ONLY.  IF CONFIRMATION IS NEEDED FOR ANY PURPOSE, NOTIFY LAB WITHIN 5 DAYS.  LOWEST DETECTABLE LIMITS FOR URINE DRUG SCREEN Drug Class                     Cutoff (ng/mL) Amphetamine and metabolites    1000 Barbiturate and metabolites    200 Benzodiazepine                 200 Opiates and metabolites        300 Cocaine and metabolites        300 THC                            50 Performed at [facility], [address] [place], [place] [address]    Preg, Serum 02/25/2024 NEGATIVE  NEGATIVE Final   Comment:        THE SENSITIVITY OF THIS METHODOLOGY IS >10 mIU/mL. Performed at [facility], [address] [place], [place] [address]    Acetaminophen  (Tylenol ), Serum 02/25/2024 <10 (L)  10 - 30 ug/mL Final   Comment: (NOTE) Therapeutic concentrations vary significantly. A range of 10-30 ug/mL  may be an effective concentration for many patients. However, some  are best treated at concentrations outside of this range. Acetaminophen  concentrations >150 ug/mL at 4 hours after ingestion  and >50 ug/mL at 12 hours after ingestion are often associated with  toxic reactions.  Performed at [facility], [address], [place], [place] [address]    Salicylate Lvl 02/25/2024 <7.0 (L)  7.0 - 30.0 mg/dL Final   Performed at [facility], [address], [place], [place] [address]  Admission on 02/21/2024, Discharged on 02/21/2024  Component Date Value Ref Range Status   WBC 02/21/2024 7.5  4.0 - 10.5 K/uL Final   RBC 02/21/2024 4.44  3.87 - 5.11 MIL/uL Final   Hemoglobin 02/21/2024 13.7  12.0 -  15.0 g/dL Final   HCT 94/90/7974 40.1  36.0 - 46.0 % Final   MCV 02/21/2024 90.3  80.0 - 100.0 fL Final   MCH 02/21/2024 30.9  26.0 - 34.0 pg Final   MCHC 02/21/2024 34.2  30.0 - 36.0 g/dL Final   RDW 94/90/7974 13.1  11.5 - 15.5 % Final   Platelets 02/21/2024 235  150 - 400 K/uL Final   nRBC 02/21/2024 0.0  0.0 - 0.2 % Final   Performed at [facility], [address] [place], [place] [address]   Sodium 02/21/2024 143  135 - 145 mmol/L Final   Potassium 02/21/2024 3.9  3.5 - 5.1 mmol/L Final   Chloride 02/21/2024 106  98 - 111 mmol/L Final   CO2 02/21/2024 26  22 - 32 mmol/L Final   Glucose, Bld 02/21/2024 100 (H)  70 - 99 mg/dL Final   Glucose reference range applies only to samples taken after fasting for at least 8 hours.   BUN 02/21/2024 14  6 -  20 mg/dL Final   Creatinine, Ser 02/21/2024 0.95  0.44 - 1.00 mg/dL Final   Calcium 94/90/7974 9.3  8.9 - 10.3 mg/dL Final   Total Protein 94/90/7974 6.3 (L)  6.5 - 8.1 g/dL Final   Albumin 94/90/7974 3.9  3.5 - 5.0 g/dL Final   AST 94/90/7974 28  15 - 41 U/L Final   ALT 02/21/2024 42  0 - 44 U/L Final   Alkaline Phosphatase 02/21/2024 92  38 - 126 U/L Final   Total Bilirubin 02/21/2024 0.5  0.0 - 1.2 mg/dL Final   GFR, Estimated 02/21/2024 >60  >60 mL/min Final   Comment: (NOTE) Calculated using the CKD-EPI Creatinine Equation (2021)    Anion gap 02/21/2024 11  5 - 15 Final   Performed at [facility], [address] [place], [place] [address]   TSH 02/21/2024 2.170  0.350 - 4.500 uIU/mL Final   Performed at [facility], [address], [place], [place] [address]   Preg, Serum 02/21/2024 NEGATIVE  NEGATIVE Final   Comment:        THE SENSITIVITY OF THIS METHODOLOGY IS >10 mIU/mL. Performed at [facility], [address], [place], [place] [address]   Admission on 02/20/2024, Discharged on 02/20/2024  Component Date Value Ref Range Status   Color,  UA 02/20/2024 yellow  yellow Final   Clarity, UA 02/20/2024 clear  clear Final   Glucose, UA 02/20/2024 negative  negative mg/dL Final   Bilirubin, UA 94/91/7974 negative  negative Final   Ketones, POC UA 02/20/2024 negative  negative mg/dL Final   Spec Grav, UA 94/91/7974 1.025  1.010 - 1.025 Final   Blood, UA 02/20/2024 trace-lysed (A)  negative Final   pH, UA 02/20/2024 6.0  5.0 - 8.0 Final   Protein Ur, POC 02/20/2024 negative  negative mg/dL Final   Urobilinogen, UA 02/20/2024 1.0  0.2 or 1.0 E.U./dL Final   Nitrite, UA 94/91/7974 Negative  Negative Final   Leukocytes, UA 02/20/2024 Negative  Negative Final   Preg Test, Ur 02/20/2024 Negative  Negative Final    Allergies: Adhesive [tape], Cherry, Sulfamethoxazole-trimethoprim, Ibuprofen , Levofloxacin, Morphine, Tizanidine, Gabapentin, Metformin, Sulfa antibiotics, and Sumatriptan  Medications:  Facility Ordered Medications  Medication   acetaminophen  (TYLENOL ) tablet 650 mg   alum & mag hydroxide-simeth (MAALOX/MYLANTA) 200-200-20 MG/5ML suspension 30 mL   magnesium  hydroxide (MILK OF MAGNESIA) suspension 30 mL   haloperidol  (HALDOL ) tablet 5 mg   And   diphenhydrAMINE  (BENADRYL ) capsule 50 mg   OLANZapine  (ZYPREXA ) injection 5 mg   OLANZapine  (ZYPREXA ) injection 10 mg   PTA Medications  Medication Sig   ergocalciferol  (VITAMIN D2) 1.25 MG (50000 UT) capsule Take 1 capsule (50,000 Units total) by mouth once a week. (Patient taking differently: Take 50,000 Units by mouth every Friday.)   LORazepam  (ATIVAN ) 0.5 MG tablet Take 1-2 tablets (0.5-1 mg total) by mouth every 4-6 hours as needed for severe panic attacks. (Patient not taking: Reported on 04/09/2024)   levonorgestrel (MIRENA, 52 MG,) 20 MCG/DAY IUD 1 each by Intrauterine route once.   methocarbamol  (ROBAXIN ) 500 MG tablet Take 500 mg by mouth 2 (two) times daily as needed for muscle spasms.   polyethylene glycol powder (GLYCOLAX /MIRALAX ) 17 GM/SCOOP powder Take 17 g by  mouth daily as needed for mild constipation or moderate constipation.   sennosides-docusate sodium  (SENOKOT-S) 8.6-50 MG tablet Take 1 tablet by mouth daily.   LORazepam  (ATIVAN ) 1 MG tablet Take 1 mg by mouth at bedtime.   acetaminophen  (  TYLENOL ) 500 MG tablet Take 1,000 mg by mouth every 8 (eight) hours.   ondansetron  (ZOFRAN ) 4 MG tablet Take 4 mg by mouth every 8 (eight) hours as needed for nausea or vomiting.   ibuprofen  (ADVIL ) 600 MG tablet Take 600 mg by mouth 4 (four) times daily.   ARIPiprazole  (ABILIFY ) 5 MG tablet Take 1 tablet (5 mg total) by mouth every morning.   escitalopram  (LEXAPRO ) 10 MG tablet Take 1 tablet (10 mg total) by mouth at bedtime.   hydrOXYzine  (ATARAX ) 25 MG tablet Take 1 tablet (25 mg total) by mouth 3 (three) times daily as needed for anxiety.   Ivermectin  1 % CREA Apply 1 Application topically at bedtime.      Medical Decision Making  Observation unit    Recommendations  Based on my evaluation the patient does not appear to have an emergency medical condition.  [name], NP 06/09/24  4:56 AM

## 2024-06-09 NOTE — ED Notes (Addendum)
 Patient seen in milieu with heavy audible breathing. Patient repeating to self I dont want to. Writer sat with patient providing support and encouragement. Patient states she is anxious and is having thoughts to hurt herself but that she does want to. Various coping skills reviewed and tried with Clinical research associate guidance. Patient sitting with pillow. Patient very jumpy at sounds or people coming near her, stating please don't hit me. Patient flinches throughout conversation when a sudden sound is made. PRN Ativan  given alongside scheduled medications. Medication administered with no complications. Environment secured, safety checks in place per facility policy. Patient remains within eye line of staff.

## 2024-06-09 NOTE — ED Notes (Signed)
 Patient alert & oriented x4. Patient reports compulsive thoughts to hurt self - states she doesn't want to hurt self but is unsure if she'll be able to not harm self at this time. Denies HI. Denies A/VH. Patient reports continued pain in foot and arm, pain medication lowered pain from 8 to 7. No acute distress noted. Support and encouragement provided. Patient observed in milieu. No inappropriate behaviors observed or reported. Routine safety checks conducted per facility protocol. Encouraged patient to notify staff if any thoughts of harm towards self or others arise. Patient verbalizes understanding and agreement.

## 2024-06-09 NOTE — ED Notes (Signed)
 Patient diagnosed of MDD, PTSD, Boderline personality disorder, and GAD. Patient is alert and oriented to time, place person and situation. Patient denies audiovisual hallucination,and SI/HI. No hygiene issues noted. No sleep disturbances reported. Nutrition intake adequate. BM remains as usual. Cognitively, emotionally and behaviorally unstable. Skin integrity; left arm cuts, stitches on right arm, scratches on right lower leg, cross tattoos on right calf, and Japanese letters on left foot.noted. As of now patient is quiet and calm. No an issue noted at this time. After eating patient went bed and fallen asleep.

## 2024-06-09 NOTE — ED Notes (Signed)
 Milieu is quiet and patient is sleeping.

## 2024-06-09 NOTE — ED Notes (Addendum)
 Wound Status/Dressing Change  R upper arm site cleansed. Site assessed by additional Publishing rights manager. Several superficial cuts present. Red along edge, patient denies pain. No odor or discharge observed. Telfa non-adherent pad 4x3 applied with tegaderm to hold in place. Dressing dated, and initialed by Clinical research associate. L upper arm site cleansed. Site assessed by additional Publishing rights manager. Several superficial cuts present. Red along edge, patient denies pain. No odor or discharge observed. No dressing placed at site per patient request. R forearm site cleansed. Stitches placed on 06/08/2024 by [facility] urgent care at [facility]. Stitches scheduled to be removed between 06/18/2024 and 06/22/2024. Stitches intact, no discharge or odor present at time of assessment. Assessed by Clinical research associate, additional RN and provider Justino Cornish, MD. Telfa non-adherent pad 4x3 applied with tegaderm to hold in place. Dressing dated, and initialed by Clinical research associate. Patient reports pain 8/10. PRN Tylenol  administered. L forearm site cleansed. Site assessed by additional Publishing rights manager. Patient reports that this site was deeper than others but that she did not get it assessed at urgent care when stitches in other site were placed. Cut slighter deeper than other superficial cuts present, does not appear to need stitches. Red along edges, patient reports a little pain. No odor or discharge observed. Telfa non-adherent pad 4x3 applied with tegaderm to hold in place. Dressing dated, and initialed by Clinical research associate. R lower leg site cleansed. Site assessed by additional Publishing rights manager. Cuts red along edge, patient denies pain. No odor or discharge observed. No dressing placed per patient request.   Findings: R upper arm site cleansed. Site assessed by additional Publishing rights manager. Several superficial cuts present. Red along edge, patient denies pain. No odor or discharge observed. Telfa non-adherent pad 4x3 applied with tegaderm to hold in place. Dressing dated,  and initialed by Clinical research associate.  L upper arm site cleansed. Site assessed by additional Publishing rights manager. Several superficial cuts present. Red along edge, patient denies pain. No odor or discharge observed. No dressing placed at site per patient request.  R forearm site cleansed. Stitches placed on 8/25. Stitches intact, no discharge or odor present at time of assessment. Assessed by Clinical research associate, additional RN and provider Justino Cornish, MD. Telfa non-adherent pad 4x3 applied with tegaderm to hold in place. Dressing dated, and initialed by Clinical research associate. Patient reports pain 8/10. PRN Tylenol  administered.  L forearm site cleansed. Site assessed by additional Publishing rights manager. Cut slighter deeper than other superficial cuts present, does not appear to need stitches. Red along edges, patient reports a little pain. No odor or discharge observed. Telfa non-adherent pad 4x3 applied with tegaderm to hold in place. Dressing dated, and initialed by Clinical research associate.  R lower leg site cleansed. Site assessed by additional Publishing rights manager. Cuts red along edge, patient denies pain. No odor or discharge observed. No dressing placed per patient request.   Zane Sink, RN
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
## 2024-06-09 NOTE — ED Notes (Signed)
 Patient discharged home w/ PHP per MD order. After Visit Summary (AVS) printed and given to patient. AVS reviewed with patient and all questions fully answered. Patient discharged in no acute distress, A& O x4 and ambulatory. Patient denied SI/HI, A/VH upon discharge. Patient verbalized understanding of all discharge instructions explained by staff, including follow up appointments, and medications. Patient mood fair. Patient belongings returned to patient from locker #28 complete and intact. Patient escorted to lobby via staff for transport to destination. Safety maintained.

## 2024-06-11 ENCOUNTER — Telehealth (HOSPITAL_COMMUNITY): Payer: Self-pay | Admitting: Licensed Clinical Social Worker

## 2024-06-11 ENCOUNTER — Ambulatory Visit (HOSPITAL_COMMUNITY)
Admission: EM | Admit: 2024-06-11 | Discharge: 2024-06-12 | Disposition: A | Discharge status: Home / Self Care | Attending: Psychiatry | Admitting: Psychiatry

## 2024-06-11 ENCOUNTER — Ambulatory Visit (INDEPENDENT_AMBULATORY_CARE_PROVIDER_SITE_OTHER): Admitting: Licensed Clinical Social Worker

## 2024-06-11 DIAGNOSIS — M549 Dorsalgia, unspecified: Secondary | ICD-10-CM | POA: Diagnosis not present

## 2024-06-11 DIAGNOSIS — F4310 Post-traumatic stress disorder, unspecified: Secondary | ICD-10-CM | POA: Diagnosis not present

## 2024-06-11 DIAGNOSIS — Z7289 Other problems related to lifestyle: Secondary | ICD-10-CM | POA: Diagnosis not present

## 2024-06-11 DIAGNOSIS — F332 Major depressive disorder, recurrent severe without psychotic features: Secondary | ICD-10-CM | POA: Insufficient documentation

## 2024-06-11 DIAGNOSIS — Z818 Family history of other mental and behavioral disorders: Secondary | ICD-10-CM | POA: Diagnosis not present

## 2024-06-11 DIAGNOSIS — F603 Borderline personality disorder: Secondary | ICD-10-CM | POA: Insufficient documentation

## 2024-06-11 DIAGNOSIS — Z79899 Other long term (current) drug therapy: Secondary | ICD-10-CM | POA: Diagnosis not present

## 2024-06-11 DIAGNOSIS — G8929 Other chronic pain: Secondary | ICD-10-CM | POA: Insufficient documentation

## 2024-06-11 DIAGNOSIS — R45851 Suicidal ideations: Secondary | ICD-10-CM | POA: Insufficient documentation

## 2024-06-11 LAB — POCT URINE DRUG SCREEN - MANUAL ENTRY (I-SCREEN)
POC Amphetamine UR: NOT DETECTED
POC Buprenorphine (BUP): NOT DETECTED
POC Cocaine UR: NOT DETECTED
POC Marijuana UR: NOT DETECTED
POC Methadone UR: NOT DETECTED
POC Methamphetamine UR: NOT DETECTED
POC Morphine: NOT DETECTED
POC Oxazepam (BZO): POSITIVE — AB
POC Oxycodone UR: NOT DETECTED
POC Secobarbital (BAR): NOT DETECTED

## 2024-06-11 LAB — POC URINE PREG, ED: Preg Test, Ur: NEGATIVE

## 2024-06-11 MED ORDER — LORAZEPAM 2 MG/ML IJ SOLN: 2.0000 mg | Freq: Three times a day (TID) | INTRAMUSCULAR | Status: DC | PRN

## 2024-06-11 MED ORDER — ACETAMINOPHEN 325 MG PO TABS
650.0000 mg | ORAL_TABLET | Freq: Four times a day (QID) | ORAL | Status: DC | PRN
  Administered 2024-06-11 – 2024-06-12 (×3): 650 mg via ORAL
  Filled 2024-06-11 (×3): qty 2

## 2024-06-11 MED ORDER — SENNOSIDES-DOCUSATE SODIUM 8.6-50 MG PO TABS: 1.0000 | ORAL_TABLET | Freq: Every day | ORAL | Status: DC | PRN

## 2024-06-11 MED ORDER — POLYETHYLENE GLYCOL 3350 17 G PO PACK: 17.0000 g | PACK | Freq: Every day | ORAL | Status: DC | PRN

## 2024-06-11 MED ORDER — DIPHENHYDRAMINE HCL 50 MG/ML IJ SOLN: 50.0000 mg | Freq: Three times a day (TID) | INTRAMUSCULAR | Status: DC | PRN

## 2024-06-11 MED ORDER — VITAMIN D (ERGOCALCIFEROL) 1.25 MG (50000 UNIT) PO CAPS
50000.0000 [IU] | ORAL_CAPSULE | ORAL | Status: DC
  Administered 2024-06-12: 50000 [IU] via ORAL
  Filled 2024-06-11: qty 1

## 2024-06-11 MED ORDER — HALOPERIDOL LACTATE 5 MG/ML IJ SOLN: 5.0000 mg | Freq: Three times a day (TID) | INTRAMUSCULAR | Status: DC | PRN

## 2024-06-11 MED ORDER — ALUM & MAG HYDROXIDE-SIMETH 200-200-20 MG/5ML PO SUSP: 30.0000 mL | ORAL | Status: DC | PRN

## 2024-06-11 MED ORDER — MAGNESIUM HYDROXIDE 400 MG/5ML PO SUSP: 30.0000 mL | Freq: Every day | ORAL | Status: DC | PRN

## 2024-06-11 MED ORDER — DIPHENHYDRAMINE HCL 50 MG PO CAPS: 50.0000 mg | ORAL_CAPSULE | Freq: Three times a day (TID) | ORAL | Status: DC | PRN

## 2024-06-11 MED ORDER — TRAZODONE HCL 50 MG PO TABS: 50.0000 mg | ORAL_TABLET | Freq: Every evening | ORAL | Status: DC | PRN

## 2024-06-11 MED ORDER — BACITRACIN-NEOMYCIN-POLYMYXIN OINTMENT TUBE
1.0000 | TOPICAL_OINTMENT | CUTANEOUS | Status: DC | PRN
Start: 2024-06-11 — End: 2024-06-12
  Administered 2024-06-12: 1 via TOPICAL
  Filled 2024-06-11: qty 14.17

## 2024-06-11 MED ORDER — ESCITALOPRAM OXALATE 10 MG PO TABS
10.0000 mg | ORAL_TABLET | Freq: Every day | ORAL | Status: DC
  Administered 2024-06-12: 10 mg via ORAL
  Filled 2024-06-11: qty 1

## 2024-06-11 MED ORDER — HALOPERIDOL LACTATE 5 MG/ML IJ SOLN
10.0000 mg | Freq: Three times a day (TID) | INTRAMUSCULAR | Status: DC | PRN
Start: 2024-06-11 — End: 2024-06-12

## 2024-06-11 MED ORDER — HALOPERIDOL 5 MG PO TABS: 5.0000 mg | ORAL_TABLET | Freq: Three times a day (TID) | ORAL | Status: DC | PRN

## 2024-06-11 MED ORDER — ARIPIPRAZOLE 10 MG PO TABS
10.0000 mg | ORAL_TABLET | Freq: Every day | ORAL | Status: DC
  Administered 2024-06-12: 10 mg via ORAL
  Filled 2024-06-11: qty 1

## 2024-06-11 MED ORDER — HYDROXYZINE HCL 25 MG PO TABS
25.0000 mg | ORAL_TABLET | Freq: Three times a day (TID) | ORAL | Status: DC | PRN
  Administered 2024-06-11 – 2024-06-12 (×3): 25 mg via ORAL
  Filled 2024-06-11 (×3): qty 1

## 2024-06-11 MED ORDER — METHOCARBAMOL 500 MG PO TABS: 500.0000 mg | ORAL_TABLET | Freq: Two times a day (BID) | ORAL | Status: DC | PRN

## 2024-06-11 MED ORDER — LORAZEPAM 0.5 MG PO TABS
0.5000 mg | ORAL_TABLET | Freq: Four times a day (QID) | ORAL | Status: DC | PRN
  Administered 2024-06-12: 0.5 mg via ORAL
  Filled 2024-06-11: qty 1

## 2024-06-11 MED ORDER — LORAZEPAM 0.5 MG PO TABS: 0.5000 mg | ORAL_TABLET | Freq: Four times a day (QID) | ORAL | Status: DC | PRN
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
## 2024-06-11 NOTE — ED Notes (Signed)
 Assumed care of patient @ 1930, patient is in the dayroom, watching TV, in pleasant mood and good spirits. She reports feeling happy, denied having thoughts to harm self or others and committed to being safe for the evening.

## 2024-06-11 NOTE — ED Provider Notes (Addendum)
 [facility] Continuous Assessment Admission H&P  Date: 06/12/24 Patient Name: Rebekah Davis MRN: [id number] Chief Complaint: self harm   Diagnoses:  Final diagnoses:  Severe episode of recurrent major depressive disorder, without psychotic features [facility])   HPI:  Rebekah Davis is a 38 y.o. female w/ hx of borderline personality disorder, PTSD, panic disorder who presents with nonsuicidal self harm and suicidal thoughts without plan or intent but saying she wants to kill herself if she does not get help.  Patient reports that she went to [facility] and was told that she needs a higher level of care.  Furthermore patient reports that she does not feel like she is able to get to appointments because she has been having eye issues.  These issues occur in the context of stress.  Reports that she also talked to her psychiatrist who told her that she needs a hospitalization and recommended patient get to a medical psychiatric unit such as [facility].  Patient reports that she is upset that she has to return but feels like she has no other options in the community.  Reports that she wants trauma therapy but is still on the wait list.  Says that she understands that hospitals will not take her but says that if she goes home that she will cut herself and says that she feels like it is time for her to just end her life if she cannot get help and she cannot keep living like this.  Discussed with patient that we can try to recommend inpatient at a medical psychiatric unit and other facilities and see if she is accepted.  Discussed that we can inquire about other options if inpatient does not work.  Says I do not know what to do anymore and I am just trying to get help.  Spoke with partial hospitalization program coordinator, Will Pollack, who says that patient was too acute for PHP with her current concerns and believes that patient necessitates inpatient or residential, although understanding that this has been  historically difficult.  She reports making APS report given patient's inability to care for herself with the intention of getting DSS social to leverage community resources for patient giving her poor functioning.    Discussed with patient our intention behind the APS reported patient was understanding and agreeable to this.  Agrees that she is unable to take care of herself and feels like she needs something.  On psychiatric view systems, patient does not describe any manic symptoms.  Denies any symptoms psychosis including hallucinations or paranoia.  Denies any thoughts to harm others.  Denies any substance use.  Reports adherence to her medications.   Total Time spent with patient: 45 minutes  Musculoskeletal  Strength & Muscle Tone: within normal limits Gait & Station: normal Patient leans: N/A  Psychiatric Specialty Exam  Presentation General Appearance:  Casual  Eye Contact: Good  Speech: Clear and Coherent  Speech Volume: Normal  Handedness: Right   Mood and Affect  Mood: Anxious  Affect: Congruent   Thought Process  Thought Processes: Coherent  Descriptions of Associations:Intact  Orientation:Full (Time, Place and Person)  Thought Content:WDL  Diagnosis of Schizophrenia or Schizoaffective disorder in past: No  Duration of Psychotic Symptoms: Greater than six months  Hallucinations: none Ideas of Reference:None  Suicidal Thoughts: suicidal thoughts without plan or intent, conditional intent to end it if she goes back home; I am just going to end it b/c I cannot get help Homicidal Thoughts: none  Sensorium  Memory: Immediate Fair  Judgment: Poor  Insight: Lacking   Executive Functions  Concentration: Fair  Attention Span: Fair  Recall: [employer] of Knowledge: Fair  Language: Fair   Psychomotor Activity  Psychomotor Activity: psychomotor retardation  Assets  Assets: Desire for Improvement; Resilience;  Vocational/Educational   Sleep  Sleep: poor    Physical Exam Vitals and nursing note reviewed.  Constitutional:      General: She is not in acute distress. HENT:     Head: Normocephalic and atraumatic.  Pulmonary:     Effort: Pulmonary effort is normal.  Skin:    Comments: Extensive supervicial cuts on bilateral forearms that healing well.   Neurological:     General: No focal deficit present.     Mental Status: She is alert.    Review of Systems  Constitutional:  Negative for fever.  Cardiovascular:  Negative for chest pain and palpitations.  Gastrointestinal:  Negative for constipation, diarrhea, nausea and vomiting.  Musculoskeletal:  Positive for joint pain.  Neurological:  Negative for dizziness, weakness and headaches.    Blood pressure 117/64, pulse 87, temperature 98.1 F (36.7 C), temperature source Oral, resp. rate 17, SpO2 100%. There is no height or weight on file to calculate BMI.  Past Psychiatric History: borderline personality disorder, PTSD, panic disorder Past Medical History: PCOS, hx scarlet fever, subacute L partial achilles tear, chronic back pain from numerous spine conditions and surgeries Family History: family history includes Depression in her mother; Diabetes in her father and mother; Heart disease in her paternal grandmother; Hypertension in her father and mother; Kidney disease in her mother; Other in her maternal grandmother; Ovarian cancer in her maternal grandmother; Stroke in her paternal grandfather; Thyroid  disease in her mother. Family Psychiatric History: no pertinent Social History: Patient lives by herself in an apartment.  Patient is not current employed but receives disability.  Denies any substance use. Tobacco Cessation:  N/A, patient does not currently use tobacco products  Last Labs:  Admission on 06/11/2024  Component Date Value Ref Range Status   POC Amphetamine UR 06/11/2024 None Detected  NONE DETECTED (Cut Off Level 1000  ng/mL) Final   POC Secobarbital (BAR) 06/11/2024 None Detected  NONE DETECTED (Cut Off Level 300 ng/mL) Final   POC Buprenorphine (BUP) 06/11/2024 None Detected  NONE DETECTED (Cut Off Level 10 ng/mL) Final   POC Oxazepam (BZO) 06/11/2024 Positive (A)  NONE DETECTED (Cut Off Level 300 ng/mL) Final   POC Cocaine UR 06/11/2024 None Detected  NONE DETECTED (Cut Off Level 300 ng/mL) Final   POC Methamphetamine UR 06/11/2024 None Detected  NONE DETECTED (Cut Off Level 1000 ng/mL) Final   POC Morphine 06/11/2024 None Detected  NONE DETECTED (Cut Off Level 300 ng/mL) Final   POC Methadone UR 06/11/2024 None Detected  NONE DETECTED (Cut Off Level 300 ng/mL) Final   POC Oxycodone  UR 06/11/2024 None Detected  NONE DETECTED (Cut Off Level 100 ng/mL) Final   POC Marijuana UR 06/11/2024 None Detected  NONE DETECTED (Cut Off Level 50 ng/mL) Final   Preg Test, Ur 06/11/2024 Negative  Negative Final  Admission on 06/03/2024, Discharged on 06/04/2024  Component Date Value Ref Range Status   WBC 06/03/2024 14.1 (H)  4.0 - 10.5 K/uL Final   RBC 06/03/2024 4.86  3.87 - 5.11 MIL/uL Final   Hemoglobin 06/03/2024 15.1 (H)  12.0 - 15.0 g/dL Final   HCT 91/79/7974 44.3  36.0 - 46.0 % Final   MCV 06/03/2024 91.2  80.0 -  100.0 fL Final   MCH 06/03/2024 31.1  26.0 - 34.0 pg Final   MCHC 06/03/2024 34.1  30.0 - 36.0 g/dL Final   RDW 91/79/7974 12.5  11.5 - 15.5 % Final   Platelets 06/03/2024 308  150 - 400 K/uL Final   nRBC 06/03/2024 0.0  0.0 - 0.2 % Final   Neutrophils Relative % 06/03/2024 71  % Final   Neutro Abs 06/03/2024 10.1 (H)  1.7 - 7.7 K/uL Final   Lymphocytes Relative 06/03/2024 21  % Final   Lymphs Abs 06/03/2024 3.0  0.7 - 4.0 K/uL Final   Monocytes Relative 06/03/2024 5  % Final   Monocytes Absolute 06/03/2024 0.7  0.1 - 1.0 K/uL Final   Eosinophils Relative 06/03/2024 1  % Final   Eosinophils Absolute 06/03/2024 0.1  0.0 - 0.5 K/uL Final   Basophils Relative 06/03/2024 1  % Final   Basophils  Absolute 06/03/2024 0.1  0.0 - 0.1 K/uL Final   Immature Granulocytes 06/03/2024 1  % Final   Abs Immature Granulocytes 06/03/2024 0.09 (H)  0.00 - 0.07 K/uL Final   Performed at [facility], [address] [place], [place] [address]   Sodium 06/03/2024 139  135 - 145 mmol/L Final   Potassium 06/03/2024 4.1  3.5 - 5.1 mmol/L Final   Chloride 06/03/2024 103  98 - 111 mmol/L Final   CO2 06/03/2024 26  22 - 32 mmol/L Final   Glucose, Bld 06/03/2024 80  70 - 99 mg/dL Final   Glucose reference range applies only to samples taken after fasting for at least 8 hours.   BUN 06/03/2024 18  6 - 20 mg/dL Final   Creatinine, Ser 06/03/2024 0.78  0.44 - 1.00 mg/dL Final   Calcium 91/79/7974 9.1  8.9 - 10.3 mg/dL Final   Total Protein 91/79/7974 6.7  6.5 - 8.1 g/dL Final   Albumin 91/79/7974 3.8  3.5 - 5.0 g/dL Final   AST 91/79/7974 22  15 - 41 U/L Final   ALT 06/03/2024 29  0 - 44 U/L Final   Alkaline Phosphatase 06/03/2024 86  38 - 126 U/L Final   Total Bilirubin 06/03/2024 0.5  0.0 - 1.2 mg/dL Final   GFR, Estimated 06/03/2024 >60  >60 mL/min Final   Comment: (NOTE) Calculated using the CKD-EPI Creatinine Equation (2021)    Anion gap 06/03/2024 10  5 - 15 Final   Performed at [facility], [address] [place], [place] [address]   Alcohol, Ethyl (B) 06/03/2024 <15  <15 mg/dL Final   Comment: (NOTE) For medical purposes only. Performed at [facility], [address], [place], [place] [address]    Preg Test, Ur 06/03/2024 Negative  Negative Final   POC Amphetamine UR 06/03/2024 None Detected  NONE DETECTED (Cut Off Level 1000 ng/mL) Final   POC Secobarbital (BAR) 06/03/2024 None Detected  NONE DETECTED (Cut Off Level 300 ng/mL) Final   POC Buprenorphine (BUP) 06/03/2024 None Detected  NONE DETECTED (Cut Off Level 10 ng/mL) Final   POC Oxazepam (BZO) 06/03/2024 None Detected  NONE DETECTED (Cut Off Level 300 ng/mL) Final   POC Cocaine UR 06/03/2024 None Detected  NONE  DETECTED (Cut Off Level 300 ng/mL) Final   POC Methamphetamine UR 06/03/2024 None Detected  NONE DETECTED (Cut Off Level 1000 ng/mL) Final   POC Morphine 06/03/2024 None Detected  NONE DETECTED (Cut Off Level 300 ng/mL) Final   POC Methadone UR 06/03/2024 None Detected  NONE  DETECTED (Cut Off Level 300 ng/mL) Final   POC Oxycodone  UR 06/03/2024 None Detected  NONE DETECTED (Cut Off Level 100 ng/mL) Final   POC Marijuana UR 06/03/2024 None Detected  NONE DETECTED (Cut Off Level 50 ng/mL) Final  Admission on 04/20/2024, Discharged on 04/21/2024  Component Date Value Ref Range Status   Sodium 04/21/2024 135  135 - 145 mmol/L Final   Potassium 04/21/2024 3.5  3.5 - 5.1 mmol/L Final   Chloride 04/21/2024 101  98 - 111 mmol/L Final   CO2 04/21/2024 24  22 - 32 mmol/L Final   Glucose, Bld 04/21/2024 95  70 - 99 mg/dL Final   Glucose reference range applies only to samples taken after fasting for at least 8 hours.   BUN 04/21/2024 16  6 - 20 mg/dL Final   Creatinine, Ser 04/21/2024 0.85  0.44 - 1.00 mg/dL Final   Calcium 92/91/7974 8.9  8.9 - 10.3 mg/dL Final   Total Protein 92/91/7974 7.1  6.5 - 8.1 g/dL Final   Albumin 92/91/7974 4.2  3.5 - 5.0 g/dL Final   AST 92/91/7974 26  15 - 41 U/L Final   ALT 04/21/2024 35  0 - 44 U/L Final   Alkaline Phosphatase 04/21/2024 73  38 - 126 U/L Final   Total Bilirubin 04/21/2024 0.9  0.0 - 1.2 mg/dL Final   GFR, Estimated 04/21/2024 >60  >60 mL/min Final   Comment: (NOTE) Calculated using the CKD-EPI Creatinine Equation (2021)    Anion gap 04/21/2024 10  5 - 15 Final   Performed at [facility], [address], [place], [place] [address]   Alcohol, Ethyl (B) 04/21/2024 <15  <15 mg/dL Final   Comment: (NOTE) For medical purposes only. Performed at [facility], [address] [place], [place] [address]    WBC 04/21/2024 10.1  4.0 - 10.5 K/uL Final   RBC 04/21/2024 4.40  3.87 - 5.11 MIL/uL Final   Hemoglobin  04/21/2024 13.5  12.0 - 15.0 g/dL Final   HCT 92/91/7974 40.6  36.0 - 46.0 % Final   MCV 04/21/2024 92.3  80.0 - 100.0 fL Final   MCH 04/21/2024 30.7  26.0 - 34.0 pg Final   MCHC 04/21/2024 33.3  30.0 - 36.0 g/dL Final   RDW 92/91/7974 12.4  11.5 - 15.5 % Final   Platelets 04/21/2024 275  150 - 400 K/uL Final   nRBC 04/21/2024 0.0  0.0 - 0.2 % Final   Performed at [facility], [address] [place] [address], [place], [place] [address]   Opiates 04/21/2024 NONE DETECTED  NONE DETECTED Final   Cocaine 04/21/2024 NONE DETECTED  NONE DETECTED Final   Benzodiazepines 04/21/2024 NONE DETECTED  NONE DETECTED Final   Amphetamines 04/21/2024 NONE DETECTED  NONE DETECTED Final   Tetrahydrocannabinol 04/21/2024 NONE DETECTED  NONE DETECTED Final   Barbiturates 04/21/2024 NONE DETECTED  NONE DETECTED Final   Comment: (NOTE) DRUG SCREEN FOR MEDICAL PURPOSES ONLY.  IF CONFIRMATION IS NEEDED FOR ANY PURPOSE, NOTIFY LAB WITHIN 5 DAYS.  LOWEST DETECTABLE LIMITS FOR URINE DRUG SCREEN Drug Class                     Cutoff (ng/mL) Amphetamine and metabolites    1000 Barbiturate and metabolites    200 Benzodiazepine                 200 Opiates and metabolites        300 Cocaine and metabolites  300 THC                            50 Performed at [facility], [address], [place], [place] [address]    Preg, Serum 04/21/2024 NEGATIVE  NEGATIVE Final   Comment:        THE SENSITIVITY OF THIS METHODOLOGY IS >10 mIU/mL. Performed at [facility], [address] [place], [place] [address]   Admission on 04/09/2024, Discharged on 04/10/2024  Component Date Value Ref Range Status   Sodium 04/09/2024 139  135 - 145 mmol/L Final   Potassium 04/09/2024 3.7  3.5 - 5.1 mmol/L Final   Chloride 04/09/2024 103  98 - 111 mmol/L Final   CO2 04/09/2024 24  22 - 32 mmol/L Final   Glucose, Bld 04/09/2024 91  70 - 99 mg/dL Final   Glucose reference range applies  only to samples taken after fasting for at least 8 hours.   BUN 04/09/2024 18  6 - 20 mg/dL Final   Creatinine, Ser 04/09/2024 1.02 (H)  0.44 - 1.00 mg/dL Final   Calcium 93/73/7974 9.2  8.9 - 10.3 mg/dL Final   Total Protein 93/73/7974 7.1  6.5 - 8.1 g/dL Final   Albumin 93/73/7974 3.9  3.5 - 5.0 g/dL Final   AST 93/73/7974 30  15 - 41 U/L Final   ALT 04/09/2024 39  0 - 44 U/L Final   Alkaline Phosphatase 04/09/2024 83  38 - 126 U/L Final   Total Bilirubin 04/09/2024 1.2  0.0 - 1.2 mg/dL Final   GFR, Estimated 04/09/2024 >60  >60 mL/min Final   Comment: (NOTE) Calculated using the CKD-EPI Creatinine Equation (2021)    Anion gap 04/09/2024 12  5 - 15 Final   Performed at [facility], [address] [place], [place] [address]   Alcohol, Ethyl (B) 04/09/2024 <15  <15 mg/dL Final   Comment: (NOTE) For medical purposes only. Performed at [facility], [address] [place], [place] [address]    Opiates 04/09/2024 NONE DETECTED  NONE DETECTED Final   Cocaine 04/09/2024 NONE DETECTED  NONE DETECTED Final   Benzodiazepines 04/09/2024 POSITIVE (A)  NONE DETECTED Final   Amphetamines 04/09/2024 NONE DETECTED  NONE DETECTED Final   Tetrahydrocannabinol 04/09/2024 NONE DETECTED  NONE DETECTED Final   Barbiturates 04/09/2024 NONE DETECTED  NONE DETECTED Final   Comment: (NOTE) DRUG SCREEN FOR MEDICAL PURPOSES ONLY.  IF CONFIRMATION IS NEEDED FOR ANY PURPOSE, NOTIFY LAB WITHIN 5 DAYS.  LOWEST DETECTABLE LIMITS FOR URINE DRUG SCREEN Drug Class                     Cutoff (ng/mL) Amphetamine and metabolites    1000 Barbiturate and metabolites    200 Benzodiazepine                 200 Opiates and metabolites        300 Cocaine and metabolites        300 THC                            50 Performed at [facility], [address], [place], [place] [address]    WBC 04/09/2024 11.3 (H)  4.0 - 10.5 K/uL Final   RBC 04/09/2024 4.72   3.87 - 5.11 MIL/uL Final   Hemoglobin 04/09/2024 14.4  12.0 - 15.0 g/dL Final   HCT 93/73/7974 43.4  36.0 - 46.0 % Final   MCV 04/09/2024 91.9  80.0 - 100.0 fL Final   MCH 04/09/2024 30.5  26.0 - 34.0 pg Final   MCHC 04/09/2024 33.2  30.0 - 36.0 g/dL Final   RDW 93/73/7974 12.2  11.5 - 15.5 % Final   Platelets 04/09/2024 295  150 - 400 K/uL Final   nRBC 04/09/2024 0.0  0.0 - 0.2 % Final   Neutrophils Relative % 04/09/2024 68  % Final   Neutro Abs 04/09/2024 7.6  1.7 - 7.7 K/uL Final   Lymphocytes Relative 04/09/2024 26  % Final   Lymphs Abs 04/09/2024 3.0  0.7 - 4.0 K/uL Final   Monocytes Relative 04/09/2024 5  % Final   Monocytes Absolute 04/09/2024 0.6  0.1 - 1.0 K/uL Final   Eosinophils Relative 04/09/2024 1  % Final   Eosinophils Absolute 04/09/2024 0.1  0.0 - 0.5 K/uL Final   Basophils Relative 04/09/2024 0  % Final   Basophils Absolute 04/09/2024 0.0  0.0 - 0.1 K/uL Final   Immature Granulocytes 04/09/2024 0  % Final   Abs Immature Granulocytes 04/09/2024 0.04  0.00 - 0.07 K/uL Final   Performed at [facility], [address], [place], [place] [address]   Preg, Serum 04/09/2024 NEGATIVE  NEGATIVE Final   Comment:        THE SENSITIVITY OF THIS METHODOLOGY IS >10 mIU/mL. Performed at [facility], [address] [place], [place] [address]    Color, Urine 04/09/2024 YELLOW  YELLOW Final   APPearance 04/09/2024 HAZY (A)  CLEAR Final   Specific Gravity, Urine 04/09/2024 1.031 (H)  1.005 - 1.030 Final   pH 04/09/2024 5.0  5.0 - 8.0 Final   Glucose, UA 04/09/2024 NEGATIVE  NEGATIVE mg/dL Final   Hgb urine dipstick 04/09/2024 SMALL (A)  NEGATIVE Final   Bilirubin Urine 04/09/2024 NEGATIVE  NEGATIVE Final   Ketones, ur 04/09/2024 NEGATIVE  NEGATIVE mg/dL Final   Protein, ur 93/73/7974 NEGATIVE  NEGATIVE mg/dL Final   Nitrite 93/73/7974 NEGATIVE  NEGATIVE Final   Leukocytes,Ua 04/09/2024 TRACE (A)  NEGATIVE Final   RBC / HPF 04/09/2024 0-5   0 - 5 RBC/hpf Final   WBC, UA 04/09/2024 0-5  0 - 5 WBC/hpf Final   Bacteria, UA 04/09/2024 RARE (A)  NONE SEEN Final   Squamous Epithelial / HPF 04/09/2024 0-5  0 - 5 /HPF Final   Mucus 04/09/2024 PRESENT   Final   Hyaline Casts, UA 04/09/2024 PRESENT   Final   Performed at [facility], [address] [place], [place] [address]   Magnesium  04/09/2024 2.0  1.7 - 2.4 mg/dL Final   Performed at [facility], [address] [place], [place] [address]  Admission on 03/16/2024, Discharged on 03/17/2024  Component Date Value Ref Range Status   WBC 03/16/2024 8.5  4.0 - 10.5 K/uL Final   RBC 03/16/2024 4.44  3.87 - 5.11 MIL/uL Final   Hemoglobin 03/16/2024 13.8  12.0 - 15.0 g/dL Final   HCT 93/97/7974 41.2  36.0 - 46.0 % Final   MCV 03/16/2024 92.8  80.0 - 100.0 fL Final   MCH 03/16/2024 31.1  26.0 - 34.0 pg Final   MCHC 03/16/2024 33.5  30.0 - 36.0 g/dL Final   RDW 93/97/7974 12.6  11.5 - 15.5 % Final   Platelets 03/16/2024 266  150 - 400 K/uL Final   nRBC  03/16/2024 0.0  0.0 - 0.2 % Final   Neutrophils Relative % 03/16/2024 56  % Final   Neutro Abs 03/16/2024 4.8  1.7 - 7.7 K/uL Final   Lymphocytes Relative 03/16/2024 34  % Final   Lymphs Abs 03/16/2024 2.8  0.7 - 4.0 K/uL Final   Monocytes Relative 03/16/2024 7  % Final   Monocytes Absolute 03/16/2024 0.6  0.1 - 1.0 K/uL Final   Eosinophils Relative 03/16/2024 2  % Final   Eosinophils Absolute 03/16/2024 0.2  0.0 - 0.5 K/uL Final   Basophils Relative 03/16/2024 1  % Final   Basophils Absolute 03/16/2024 0.0  0.0 - 0.1 K/uL Final   Immature Granulocytes 03/16/2024 0  % Final   Abs Immature Granulocytes 03/16/2024 0.03  0.00 - 0.07 K/uL Final   Performed at [facility], [address] [place], [place] [address]   Sodium 03/16/2024 141  135 - 145 mmol/L Final   Potassium 03/16/2024 3.9  3.5 - 5.1 mmol/L Final   Chloride 03/16/2024 107  98 - 111 mmol/L Final   CO2 03/16/2024 25  22 - 32  mmol/L Final   Glucose, Bld 03/16/2024 113 (H)  70 - 99 mg/dL Final   Glucose reference range applies only to samples taken after fasting for at least 8 hours.   BUN 03/16/2024 19  6 - 20 mg/dL Final   Creatinine, Ser 03/16/2024 1.24 (H)  0.44 - 1.00 mg/dL Final   Calcium 93/97/7974 9.2  8.9 - 10.3 mg/dL Final   Total Protein 93/97/7974 5.8 (L)  6.5 - 8.1 g/dL Final   Albumin 93/97/7974 3.5  3.5 - 5.0 g/dL Final   AST 93/97/7974 23  15 - 41 U/L Final   ALT 03/16/2024 39  0 - 44 U/L Final   Alkaline Phosphatase 03/16/2024 76  38 - 126 U/L Final   Total Bilirubin 03/16/2024 0.5  0.0 - 1.2 mg/dL Final   GFR, Estimated 03/16/2024 57 (L)  >60 mL/min Final   Comment: (NOTE) Calculated using the CKD-EPI Creatinine Equation (2021)    Anion gap 03/16/2024 9  5 - 15 Final   Performed at [facility], [address] [place], [place] [address]   Hgb A1c MFr Bld 03/16/2024 4.6 (L)  4.8 - 5.6 % Final   Comment: (NOTE) Diagnosis of Diabetes The following HbA1c ranges recommended by the American Diabetes Association (ADA) may be used as an aid in the diagnosis of diabetes mellitus.  Hemoglobin             Suggested A1C NGSP%              Diagnosis  <5.7                   Non Diabetic  5.7-6.4                Pre-Diabetic  >6.4                   Diabetic  <7.0                   Glycemic control for                       adults with diabetes.     Mean Plasma Glucose 03/16/2024 85.32  mg/dL Final   Performed at [facility], [address], [place], [place] [address]   Cholesterol 03/16/2024 169  0 -  200 mg/dL Final   Triglycerides 93/97/7974 127  <150 mg/dL Final   HDL 93/97/7974 38 (L)  >40 mg/dL Final   Total CHOL/HDL Ratio 03/16/2024 4.4  RATIO Final   VLDL 03/16/2024 25  0 - 40 mg/dL Final   LDL Cholesterol 03/16/2024 106 (H)  0 - 99 mg/dL Final   Comment:        Total Cholesterol/HDL:CHD Risk Coronary Heart Disease Risk Table                     Men   Women  1/2  Average Risk   3.4   3.3  Average Risk       5.0   4.4  2 X Average Risk   9.6   7.1  3 X Average Risk  23.4   11.0        Use the calculated Patient Ratio above and the CHD Risk Table to determine the patient's CHD Risk.        ATP III CLASSIFICATION (LDL):  <100     mg/dL   Optimal  899-870  mg/dL   Near or Above                    Optimal  130-159  mg/dL   Borderline  839-810  mg/dL   High  >809     mg/dL   Very High Performed at [facility], [address], [place], [place] [address]    TSH 03/16/2024 2.009  0.350 - 4.500 uIU/mL Final   Comment: Performed by a 3rd Generation assay with a functional sensitivity of <=0.01 uIU/mL. Performed at [facility], [address] [place], [place] [address]   Admission on 02/25/2024, Discharged on 02/26/2024  Component Date Value Ref Range Status   Sodium 02/25/2024 140  135 - 145 mmol/L Final   Potassium 02/25/2024 3.5  3.5 - 5.1 mmol/L Final   Chloride 02/25/2024 104  98 - 111 mmol/L Final   CO2 02/25/2024 22  22 - 32 mmol/L Final   Glucose, Bld 02/25/2024 129 (H)  70 - 99 mg/dL Final   Glucose reference range applies only to samples taken after fasting for at least 8 hours.   BUN 02/25/2024 11  6 - 20 mg/dL Final   Creatinine, Ser 02/25/2024 0.88  0.44 - 1.00 mg/dL Final   Calcium 94/86/7974 9.3  8.9 - 10.3 mg/dL Final   Total Protein 94/86/7974 7.3  6.5 - 8.1 g/dL Final   Albumin 94/86/7974 4.0  3.5 - 5.0 g/dL Final   AST 94/86/7974 29  15 - 41 U/L Final   ALT 02/25/2024 43  0 - 44 U/L Final   Alkaline Phosphatase 02/25/2024 75  38 - 126 U/L Final   Total Bilirubin 02/25/2024 1.0  0.0 - 1.2 mg/dL Final   GFR, Estimated 02/25/2024 >60  >60 mL/min Final   Comment: (NOTE) Calculated using the CKD-EPI Creatinine Equation (2021)    Anion gap 02/25/2024 14  5 - 15 Final   Performed at [facility], [address], [place], [place] [address]   Alcohol, Ethyl (B) 02/25/2024 <15  <15 mg/dL Final   Comment: Please  note change in reference range. (NOTE) For medical purposes only. Performed at [facility], [address] [place], [place] [address]    WBC 02/25/2024 11.0 (H)  4.0 - 10.5 K/uL Final   RBC 02/25/2024 5.04  3.87 - 5.11 MIL/uL Final  Hemoglobin 02/25/2024 15.6 (H)  12.0 - 15.0 g/dL Final   HCT 94/86/7974 46.1 (H)  36.0 - 46.0 % Final   MCV 02/25/2024 91.5  80.0 - 100.0 fL Final   MCH 02/25/2024 31.0  26.0 - 34.0 pg Final   MCHC 02/25/2024 33.8  30.0 - 36.0 g/dL Final   RDW 94/86/7974 12.8  11.5 - 15.5 % Final   Platelets 02/25/2024 313  150 - 400 K/uL Final   nRBC 02/25/2024 0.0  0.0 - 0.2 % Final   Performed at [facility], [address], [place], [place] [address]   Opiates 02/25/2024 NONE DETECTED  NONE DETECTED Final   Cocaine 02/25/2024 NONE DETECTED  NONE DETECTED Final   Benzodiazepines 02/25/2024 NONE DETECTED  NONE DETECTED Final   Amphetamines 02/25/2024 NONE DETECTED  NONE DETECTED Final   Tetrahydrocannabinol 02/25/2024 NONE DETECTED  NONE DETECTED Final   Barbiturates 02/25/2024 NONE DETECTED  NONE DETECTED Final   Comment: (NOTE) DRUG SCREEN FOR MEDICAL PURPOSES ONLY.  IF CONFIRMATION IS NEEDED FOR ANY PURPOSE, NOTIFY LAB WITHIN 5 DAYS.  LOWEST DETECTABLE LIMITS FOR URINE DRUG SCREEN Drug Class                     Cutoff (ng/mL) Amphetamine and metabolites    1000 Barbiturate and metabolites    200 Benzodiazepine                 200 Opiates and metabolites        300 Cocaine and metabolites        300 THC                            50 Performed at [facility], [address], [place], [place]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

## 2024-06-11 NOTE — ED Notes (Addendum)
 Patient calm and cooperative during initial nurse shift assessment. Pt reports having passive SI thought with no intention on acting on them. Patient denies any AH/VH.HI. Stitches on right forearm clean and dry. No discharge noted. Patient denies any pain or itchiness in area. Attempted to collect labs on patient. Unable to complete due to dehydration of patient. Will attempt again in later in the shift. RR even and unlabored, appearing in no noted distress. Environmental check complete.
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
## 2024-06-11 NOTE — Telephone Encounter (Signed)
 See call intake

## 2024-06-11 NOTE — ED Notes (Signed)
 Patient currently resting on the side of recliner. RR even and unlabored, appearing in no noted distress. Environmental check complete
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
## 2024-06-11 NOTE — Psych (Signed)
 Virtual Visit via Video Note  I connected with Rebekah Davis on 06/11/24 at  1:00 PM EDT by a video enabled telemedicine application and verified that I am speaking with the correct person using two identifiers.  Location: Patient: pt's home in [place], [place] Provider: clinical home office in [place], [place]   I discussed the limitations of evaluation and management by telemedicine and the availability of in person appointments. The patient expressed understanding and agreed to proceed.  I discussed the assessment and treatment plan with the patient. The patient was provided an opportunity to ask questions and all were answered. The patient agreed with the plan and demonstrated an understanding of the instructions.   The patient was advised to call back or seek an in-person evaluation if the symptoms worsen or if the condition fails to improve as anticipated.  I provided 28 minutes of non-face-to-face time during this encounter.   Rebekah LILLETTE Pollack, LCSW  Cln met with pt via Caregility and oriented pt to PHP. Pt responded, I don't think that's a good idea. I think I need to go to the hospital but nowhere Rebekah accept me because of my foot. Pt shares that she has been losing time frequently. She is oriented to person, place, and situation, but when asked to provide the date she states she is unsure but guesses it is Tuesday, August 22nd. She is oriented to the year. She endorses numerous ED and [facility] visits recently due to SI and NSSI and states she has been held in the ED for several days, but declined admission due to wearing a boot for ankle, which she reports needs surgery. She denies SI but makes vague statements indicating passive SI, such as I should just be done with it because nothing Rebekah fix it. When asked about supports, she states she does not have any. She identifies her brother in [place] as her emergency contact and states, But that's just like for a funeral or something.  She reports hopelessness and fear and states she does not know how to get the help she needs. She reiterates no support, stating her mother is deceased, her brother lives in [place] and is caring for children with ASD, and her sister is caring for their father with dementia[place]. She also reports one friend who lives in [place] . She states she is cutting herself while dissociating and has no control over it. She reports losing hours or days at a time. She is noted to have numerous cuts on her forearms, one of which she presses gauze to during the assessment. Cln reviews pt's chart to confirm pt's reports.   Cln recommends pt go back to [facility] due to pt being too acute for PHP and requiring inpatient or residential treatment. Pt agrees that she needs a higher level of care. Cln informs pt that cln Rebekah be making an APS report with [facility] due to pt being unable to keep herself safe or adequately care for herself, per her report. Cln explains that APS can connect her with a Child psychotherapist to assist with helping her access resources. Pt agrees to go to [facility] and states she can be there in 10 minutes. Cln completed APS report at 2:34 pm after having to leave a vm on the intake line at 1:51 pm. Cln informed [facility] providers of same.

## 2024-06-12 DIAGNOSIS — F332 Major depressive disorder, recurrent severe without psychotic features: Secondary | ICD-10-CM | POA: Diagnosis not present

## 2024-06-12 LAB — COMPREHENSIVE METABOLIC PANEL WITH GFR
ALT: 26 U/L (ref 0–44)
AST: 17 U/L (ref 15–41)
Albumin: 4 g/dL (ref 3.5–5.0)
Alkaline Phosphatase: 95 U/L (ref 38–126)
Anion gap: 8 (ref 5–15)
BUN: 11 mg/dL (ref 6–20)
CO2: 27 mmol/L (ref 22–32)
Calcium: 9.3 mg/dL (ref 8.9–10.3)
Chloride: 104 mmol/L (ref 98–111)
Creatinine, Ser: 0.76 mg/dL (ref 0.44–1.00)
GFR, Estimated: >60 mL/min (ref >60)
Glucose, Bld: 83 mg/dL (ref 70–99)
Potassium: 4 mmol/L (ref 3.5–5.1)
Sodium: 139 mmol/L (ref 135–145)
Total Bilirubin: 0.7 mg/dL (ref 0.0–1.2)
Total Protein: 7.2 g/dL (ref 6.5–8.1)

## 2024-06-12 LAB — CBC WITH DIFFERENTIAL/PLATELET
Abs Immature Granulocytes: 0.08 K/uL — ABNORMAL HIGH (ref 0.00–0.07)
Basophils Absolute: 0.1 K/uL (ref 0.0–0.1)
Basophils Relative: 1 %
Eosinophils Absolute: 0.2 K/uL (ref 0.0–0.5)
Eosinophils Relative: 2 %
HCT: 47.9 % — ABNORMAL HIGH (ref 36.0–46.0)
Hemoglobin: 16 g/dL — ABNORMAL HIGH (ref 12.0–15.0)
Immature Granulocytes: 1 %
Lymphocytes Relative: 27 %
Lymphs Abs: 2.8 K/uL (ref 0.7–4.0)
MCH: 30.9 pg (ref 26.0–34.0)
MCHC: 33.4 g/dL (ref 30.0–36.0)
MCV: 92.6 fL (ref 80.0–100.0)
Monocytes Absolute: 0.5 K/uL (ref 0.1–1.0)
Monocytes Relative: 5 %
Neutro Abs: 7 K/uL (ref 1.7–7.7)
Neutrophils Relative %: 64 %
Platelets: 306 K/uL (ref 150–400)
RBC: 5.17 MIL/uL — ABNORMAL HIGH (ref 3.87–5.11)
RDW: 12.4 % (ref 11.5–15.5)
WBC: 10.6 K/uL — ABNORMAL HIGH (ref 4.0–10.5)
nRBC: 0 % (ref 0.0–0.2)

## 2024-06-12 NOTE — ED Notes (Signed)
 Pt A&Ox4, calm, irritable & cooperative and in NAD at this time. Denies SI/HI/AVH. Pt endorses thoughts of wanting to bang her head into the wall, but contracts for safety. Encouragement and support given. Will continue to monitor.

## 2024-06-12 NOTE — ED Notes (Addendum)
 Attempted to get labs. Unable to collect labs due to no blood return. During labs patient expressed anxious thoughts regarding passive SI. Patient reported to nurse she had no intent. PRN hydroxyzine  given.

## 2024-06-12 NOTE — ED Notes (Signed)
 Patient currently sleeping and resting in recliner. RR even and unlabored, appearing in no noted distress. Environmental check complete

## 2024-06-12 NOTE — ED Notes (Signed)
 Discharge instructions reviewed w/ pt. Follow-up care and resources reviewed. Pt verbalized understanding. All belongings returned to pt. Pt A&Ox4, ambulatory w/ steady gait and VSS upon departure.

## 2024-06-12 NOTE — Care Management (Addendum)
 OBS Care Management   Writer consulted with Dr. Justino regarding the patient.  Writer referred the patient to [facility] for [facility] for (CST/ACTT) services.  Due to the patient having Medicare she will not qualify for services unless her Medicare is switched to Medicaid.    Writer left a HIPAA compliant voice mail message with the APS worker Arch Ada [phone] regarding the status of the APS case.    11am  APS worker  Arch Ada [phone] came to the [facility] and met with the patient regarding the APS case.  The APS worker is able to complete the application in order for the patient to have her Medicare insurance switched to [employer].  Writer has also requested for the patient to have a [facility].    12pm  Patient was able to meet with the intake worker with [facility] [phone] Sharlie Blunt).  Per the intake worker they were able to complete the CST/ACTT intake forms and pending the transfer from Medicare to Medicaid they will be able to start services with the patient.   The intake worker from [facility] was able to meet with APS worker and they will collaborate together to ensure that the patient Medicare is switched to Medicaid so that the patient is able to receive services.    [facility] will be able to follow up via phone contact check ins until the patients Medicaid is active and services are authorized.    Writer informed the Dr. Artie and the RN working with the patient.   1:30pm  Writer left a HIPAA compliant voicemail in order to make a referral for Peer Support Services with BHRT Lead Peer Support: Harlene Garbe [phone]

## 2024-06-12 NOTE — ED Notes (Signed)
 Pt sleeping at this time. Rise and fall of chest noted. Pt in NAD at this time. Will continue to monitor.

## 2024-06-12 NOTE — ED Notes (Signed)
 Pt A&Ox4 sitting and eating lunch at this time. In NAD. Will continue to monitor.

## 2024-06-12 NOTE — Discharge Instructions (Signed)
 Adult protective services and Assertive Community Treatment Team will follow up with you. Return to [facility] with acute safety concerns.    Follow-up recommendations:  Activity:  Normal, as tolerated Diet:  Per PCP recommendation  Patient is instructed prior to discharge to:  Take all medications as prescribed by mental healthcare provider. Report any adverse effects and/or reactions from the medicines to outpatient provider promptly. To not engage in substance use while on psychiatric medicines.  In the event of worsening symptoms, patient is instructed to call the crisis hotline at 988, 911, or go to the nearest ED for appropriate evaluation and treatment of symptoms. To follow-up with primary care provider for your other medical issues, concerns and, or healthcare needs.

## 2024-06-12 NOTE — ED Provider Notes (Signed)
 Behavioral Health Progress Note  Date and Time: 06/12/2024 11:03 AM Name: Rebekah Davis MRN:  [id number]  HPI:  Rebekah Davis is a 38 y.o. female w/ hx of borderline personality disorder, PTSD, panic disorder, chronic L achilles tear since 2019, chronic back pain including numerous neurosurgical procedures who presents with nonsuicidal self harm by cutting and suicidal thoughts without plan or intent but saying she wants to kill herself if she does not get help.   APS report was made given patient worsening functions and given that she resort to self harming in increasing severe methods recently. Concern that patient cannot care for herself in the community. Intent of contacting APS also to support with resources to get her to higher level of community support.   5-10+ emergency department presentations per month recently for self-harming behaviors by cutting self which have escalated to episode recently which require stitches.  Historically has not been accepted to inpatient facilities due to her borderline personality and medical comorbidites (e.g. partial achilles tear for a while now) and unlikely benefit (as she is on stable medication regimen and chronically return to baseline suicidal thougths and self harm).  More recently she has been unable to care for herself and resorts to cutting for every minor issue in her life.  Patient is to the point that she cannot go home and take care of herself without almost daily coming to point of thinking about or actively harming herself and coming to emergency departments.    Subjective:   Patient reports that she is unable to care for herself and still does not feel safe going home as she has knives (which she needs for cooking) and will resort to cutting herself and feels like she is feeling more hopeless to the point that she wants to just die because she cannot get help.  Does not have a plan for how she would kill herself but does not yes when asked if  she would cut herself fatally.  She does endorse intent to cut herself if she goes home as she does not feel like she can care for herself or manage herself.  Rebekah Davis behavior health coordinator spoke with [facility] as well as transitional living community's and patient is denied due to her Medicaid.  Diagnosis:  Final diagnoses:  Severe episode of recurrent major depressive disorder, without psychotic features (HCC)    Total Time spent with patient: 30 minutes  Past Psychiatric History: borderline personality disorder, PTSD, panic disorder ; decline from hospitalizations due to medical comorbidities these and borderline personality disorder Past Medical History: PCOS, hx scarlet fever, chronic L partial achilles tear, chronic back pain from numerous spine conditions and surgeries  Family History: family history includes Depression in her mother; Diabetes in her father and mother; Heart disease in her paternal grandmother; Hypertension in her father and mother; Kidney disease in her mother; Other in her maternal grandmother; Ovarian cancer in her maternal grandmother; Stroke in her paternal grandfather; Thyroid  disease in her mother. Family Psychiatric  History: no pertinent psych history.  Social History: Patient lives by herself in an apartment. Patient is not current employed but receives disability. Denies any substance use.  Does not have any social support except for a friend that lives in [place] .   Sleep: Fair  Appetite:  Fair  Current Medications:  Current Facility-Administered Medications  Medication Dose Route Frequency Provider Last Rate Last Admin   acetaminophen  (TYLENOL ) tablet 650 mg  650 mg Oral Q6H PRN Toyna Erisman, MD  650 mg at 06/12/24 0001   alum & mag hydroxide-simeth (MAALOX/MYLANTA) 200-200-20 MG/5ML suspension 30 mL  30 mL Oral Q4H PRN Alfonse Garringer, MD       ARIPiprazole  (ABILIFY ) tablet 10 mg  10 mg Oral Daily Jelan Batterton, MD   10 mg at 06/12/24 9090    haloperidol  (HALDOL ) tablet 5 mg  5 mg Oral TID PRN Sheleen Conchas, MD       And   diphenhydrAMINE  (BENADRYL ) capsule 50 mg  50 mg Oral TID PRN Tien Aispuro, MD       haloperidol  lactate (HALDOL ) injection 5 mg  5 mg Intramuscular TID PRN Deziree Mokry, MD       And   diphenhydrAMINE  (BENADRYL ) injection 50 mg  50 mg Intramuscular TID PRN Cristiano Capri, MD       And   LORazepam  (ATIVAN ) injection 2 mg  2 mg Intramuscular TID PRN Dafney Farler, MD       haloperidol  lactate (HALDOL ) injection 10 mg  10 mg Intramuscular TID PRN Darlene Bartelt, MD       And   diphenhydrAMINE  (BENADRYL ) injection 50 mg  50 mg Intramuscular TID PRN Daiquan Resnik, MD       And   LORazepam  (ATIVAN ) injection 2 mg  2 mg Intramuscular TID PRN Cregg Jutte, MD       escitalopram  (LEXAPRO ) tablet 10 mg  10 mg Oral Daily Daysha Ashmore, MD   10 mg at 06/12/24 9090   hydrOXYzine  (ATARAX ) tablet 25 mg  25 mg Oral TID PRN Nelma Phagan, MD   25 mg at 06/12/24 0525   LORazepam  (ATIVAN ) tablet 0.5 mg  0.5 mg Oral Q6H PRN Asusena Sigley, MD   0.5 mg at 06/12/24 9155   magnesium  hydroxide (MILK OF MAGNESIA) suspension 30 mL  30 mL Oral Daily PRN Cesia Orf, MD       methocarbamol  (ROBAXIN ) tablet 500 mg  500 mg Oral BID PRN Pius Byrom, MD       neomycin -bacitracin -polymyxin (NEOSPORIN) ointment 1 Application  1 Application Topical PRN Niharika Savino, MD   1 Application at 06/12/24 0516   polyethylene glycol (MIRALAX  / GLYCOLAX ) packet 17 g  17 g Oral Daily PRN Oseias Horsey, MD       senna-docusate (Senokot-S) tablet 1 tablet  1 tablet Oral Daily PRN Daouda Lonzo, MD       traZODone  (DESYREL ) tablet 50 mg  50 mg Oral QHS PRN Rama Mcclintock, MD       Vitamin D  (Ergocalciferol ) (DRISDOL ) 1.25 MG (50000 UNIT) capsule 50,000 Units  50,000 Units Oral Weekly Mickaela Starlin, MD   50,000 Units at 06/12/24 0909   Current Outpatient Medications  Medication Sig Dispense Refill   acetaminophen  (TYLENOL ) 500 MG  tablet Take 1,000 mg by mouth 3 (three) times daily as needed for mild pain (pain score 1-3) or moderate pain (pain score 4-6).     ARIPiprazole  (ABILIFY ) 10 MG tablet Take 10 mg by mouth daily.     ergocalciferol  (VITAMIN D2) 1.25 MG (50000 UT) capsule Take 1 capsule (50,000 Units total) by mouth once a week. (Patient taking differently: Take 50,000 Units by mouth once a week. On Fridays) 4 capsule 2   escitalopram  (LEXAPRO ) 10 MG tablet Take 1 tablet (10 mg total) by mouth at bedtime. (Patient taking differently: Take 10 mg by mouth in the morning.) 30 tablet 0   hydrOXYzine  (ATARAX ) 25 MG tablet Take 1 tablet (25 mg total) by mouth 3 (three) times daily as  needed for anxiety. 30 tablet 0   levonorgestrel (MIRENA, 52 MG,) 20 MCG/DAY IUD 1 each by Intrauterine route once.     LORazepam  (ATIVAN ) 1 MG tablet Take 1 mg by mouth at bedtime as needed for sleep.     methocarbamol  (ROBAXIN ) 500 MG tablet Take 500 mg by mouth 2 (two) times daily as needed for muscle spasms.     neomycin -bacitracin -polymyxin (NEOSPORIN) OINT Apply 1 Application topically as needed for wound care.     polyethylene glycol powder (GLYCOLAX /MIRALAX ) 17 GM/SCOOP powder Take 17 g by mouth daily as needed for mild constipation or moderate constipation.     Scar Treatment Products [facility] ADVANCED SCAR GEL EX) Apply 1 Application topically daily.     sennosides-docusate sodium  (SENOKOT-S) 8.6-50 MG tablet Take 1 tablet by mouth as needed for constipation.      Labs  Lab Results:  Admission on 06/11/2024  Component Date Value Ref Range Status   POC Amphetamine UR 06/11/2024 None Detected  NONE DETECTED (Cut Off Level 1000 ng/mL) Final   POC Secobarbital (BAR) 06/11/2024 None Detected  NONE DETECTED (Cut Off Level 300 ng/mL) Final   POC Buprenorphine (BUP) 06/11/2024 None Detected  NONE DETECTED (Cut Off Level 10 ng/mL) Final   POC Oxazepam (BZO) 06/11/2024 Positive (A)  NONE DETECTED (Cut Off Level 300 ng/mL) Final   POC  Cocaine UR 06/11/2024 None Detected  NONE DETECTED (Cut Off Level 300 ng/mL) Final   POC Methamphetamine UR 06/11/2024 None Detected  NONE DETECTED (Cut Off Level 1000 ng/mL) Final   POC Morphine 06/11/2024 None Detected  NONE DETECTED (Cut Off Level 300 ng/mL) Final   POC Methadone UR 06/11/2024 None Detected  NONE DETECTED (Cut Off Level 300 ng/mL) Final   POC Oxycodone  UR 06/11/2024 None Detected  NONE DETECTED (Cut Off Level 100 ng/mL) Final   POC Marijuana UR 06/11/2024 None Detected  NONE DETECTED (Cut Off Level 50 ng/mL) Final   Preg Test, Ur 06/11/2024 Negative  Negative Final  Admission on 06/03/2024, Discharged on 06/04/2024  Component Date Value Ref Range Status   WBC 06/03/2024 14.1 (H)  4.0 - 10.5 K/uL Final   RBC 06/03/2024 4.86  3.87 - 5.11 MIL/uL Final   Hemoglobin 06/03/2024 15.1 (H)  12.0 - 15.0 g/dL Final   HCT 91/79/7974 44.3  36.0 - 46.0 % Final   MCV 06/03/2024 91.2  80.0 - 100.0 fL Final   MCH 06/03/2024 31.1  26.0 - 34.0 pg Final   MCHC 06/03/2024 34.1  30.0 - 36.0 g/dL Final   RDW 91/79/7974 12.5  11.5 - 15.5 % Final   Platelets 06/03/2024 308  150 - 400 K/uL Final   nRBC 06/03/2024 0.0  0.0 - 0.2 % Final   Neutrophils Relative % 06/03/2024 71  % Final   Neutro Abs 06/03/2024 10.1 (H)  1.7 - 7.7 K/uL Final   Lymphocytes Relative 06/03/2024 21  % Final   Lymphs Abs 06/03/2024 3.0  0.7 - 4.0 K/uL Final   Monocytes Relative 06/03/2024 5  % Final   Monocytes Absolute 06/03/2024 0.7  0.1 - 1.0 K/uL Final   Eosinophils Relative 06/03/2024 1  % Final   Eosinophils Absolute 06/03/2024 0.1  0.0 - 0.5 K/uL Final   Basophils Relative 06/03/2024 1  % Final   Basophils Absolute 06/03/2024 0.1  0.0 - 0.1 K/uL Final   Immature Granulocytes 06/03/2024 1  % Final   Abs Immature Granulocytes 06/03/2024 0.09 (H)  0.00 - 0.07 K/uL Final   Performed  at [facility], [address], [place], [place] [address]   Sodium 06/03/2024 139  135 - 145 mmol/L Final   Potassium  06/03/2024 4.1  3.5 - 5.1 mmol/L Final   Chloride 06/03/2024 103  98 - 111 mmol/L Final   CO2 06/03/2024 26  22 - 32 mmol/L Final   Glucose, Bld 06/03/2024 80  70 - 99 mg/dL Final   Glucose reference range applies only to samples taken after fasting for at least 8 hours.   BUN 06/03/2024 18  6 - 20 mg/dL Final   Creatinine, Ser 06/03/2024 0.78  0.44 - 1.00 mg/dL Final   Calcium 91/79/7974 9.1  8.9 - 10.3 mg/dL Final   Total Protein 91/79/7974 6.7  6.5 - 8.1 g/dL Final   Albumin 91/79/7974 3.8  3.5 - 5.0 g/dL Final   AST 91/79/7974 22  15 - 41 U/L Final   ALT 06/03/2024 29  0 - 44 U/L Final   Alkaline Phosphatase 06/03/2024 86  38 - 126 U/L Final   Total Bilirubin 06/03/2024 0.5  0.0 - 1.2 mg/dL Final   GFR, Estimated 06/03/2024 >60  >60 mL/min Final   Comment: (NOTE) Calculated using the CKD-EPI Creatinine Equation (2021)    Anion gap 06/03/2024 10  5 - 15 Final   Performed at [facility], [address], [place], [place] [address]   Alcohol, Ethyl (B) 06/03/2024 <15  <15 mg/dL Final   Comment: (NOTE) For medical purposes only. Performed at [facility], [address], [place], [place] [address]    Preg Test, Ur 06/03/2024 Negative  Negative Final   POC Amphetamine UR 06/03/2024 None Detected  NONE DETECTED (Cut Off Level 1000 ng/mL) Final   POC Secobarbital (BAR) 06/03/2024 None Detected  NONE DETECTED (Cut Off Level 300 ng/mL) Final   POC Buprenorphine (BUP) 06/03/2024 None Detected  NONE DETECTED (Cut Off Level 10 ng/mL) Final   POC Oxazepam (BZO) 06/03/2024 None Detected  NONE DETECTED (Cut Off Level 300 ng/mL) Final   POC Cocaine UR 06/03/2024 None Detected  NONE DETECTED (Cut Off Level 300 ng/mL) Final   POC Methamphetamine UR 06/03/2024 None Detected  NONE DETECTED (Cut Off Level 1000 ng/mL) Final   POC Morphine 06/03/2024 None Detected  NONE DETECTED (Cut Off Level 300 ng/mL) Final   POC Methadone UR 06/03/2024 None Detected  NONE DETECTED (Cut Off Level 300  ng/mL) Final   POC Oxycodone  UR 06/03/2024 None Detected  NONE DETECTED (Cut Off Level 100 ng/mL) Final   POC Marijuana UR 06/03/2024 None Detected  NONE DETECTED (Cut Off Level 50 ng/mL) Final  Admission on 04/20/2024, Discharged on 04/21/2024  Component Date Value Ref Range Status   Sodium 04/21/2024 135  135 - 145 mmol/L Final   Potassium 04/21/2024 3.5  3.5 - 5.1 mmol/L Final   Chloride 04/21/2024 101  98 - 111 mmol/L Final   CO2 04/21/2024 24  22 - 32 mmol/L Final   Glucose, Bld 04/21/2024 95  70 - 99 mg/dL Final   Glucose reference range applies only to samples taken after fasting for at least 8 hours.   BUN 04/21/2024 16  6 - 20 mg/dL Final   Creatinine, Ser 04/21/2024 0.85  0.44 - 1.00 mg/dL Final   Calcium 92/91/7974 8.9  8.9 - 10.3 mg/dL Final   Total Protein 92/91/7974 7.1  6.5 - 8.1 g/dL Final   Albumin 92/91/7974 4.2  3.5 - 5.0 g/dL Final   AST 92/91/7974 26  15 - 41 U/L Final   ALT 04/21/2024 35  0 - 44 U/L Final   Alkaline Phosphatase 04/21/2024 73  38 - 126 U/L Final   Total Bilirubin 04/21/2024 0.9  0.0 - 1.2 mg/dL Final   GFR, Estimated 04/21/2024 >60  >60 mL/min Final   Comment: (NOTE) Calculated using the CKD-EPI Creatinine Equation (2021)    Anion gap 04/21/2024 10  5 - 15 Final   Performed at [facility], [address] [place], [place] [address]   Alcohol, Ethyl (B) 04/21/2024 <15  <15 mg/dL Final   Comment: (NOTE) For medical purposes only. Performed at [facility], [address], [place], [place] [address]    WBC 04/21/2024 10.1  4.0 - 10.5 K/uL Final   RBC 04/21/2024 4.40  3.87 - 5.11 MIL/uL Final   Hemoglobin 04/21/2024 13.5  12.0 - 15.0 g/dL Final   HCT 92/91/7974 40.6  36.0 - 46.0 % Final   MCV 04/21/2024 92.3  80.0 - 100.0 fL Final   MCH 04/21/2024 30.7  26.0 - 34.0 pg Final   MCHC 04/21/2024 33.3  30.0 - 36.0 g/dL Final   RDW 92/91/7974 12.4  11.5 - 15.5 % Final   Platelets 04/21/2024 275  150 - 400 K/uL  Final   nRBC 04/21/2024 0.0  0.0 - 0.2 % Final   Performed at [facility], [address], [place], [place] [address]   Opiates 04/21/2024 NONE DETECTED  NONE DETECTED Final   Cocaine 04/21/2024 NONE DETECTED  NONE DETECTED Final   Benzodiazepines 04/21/2024 NONE DETECTED  NONE DETECTED Final   Amphetamines 04/21/2024 NONE DETECTED  NONE DETECTED Final   Tetrahydrocannabinol 04/21/2024 NONE DETECTED  NONE DETECTED Final   Barbiturates 04/21/2024 NONE DETECTED  NONE DETECTED Final   Comment: (NOTE) DRUG SCREEN FOR MEDICAL PURPOSES ONLY.  IF CONFIRMATION IS NEEDED FOR ANY PURPOSE, NOTIFY LAB WITHIN 5 DAYS.  LOWEST DETECTABLE LIMITS FOR URINE DRUG SCREEN Drug Class                     Cutoff (ng/mL) Amphetamine and metabolites    1000 Barbiturate and metabolites    200 Benzodiazepine                 200 Opiates and metabolites        300 Cocaine and metabolites        300 THC                            50 Performed at [facility], [address] [place], [place] [address]    Preg, Serum 04/21/2024 NEGATIVE  NEGATIVE Final   Comment:        THE SENSITIVITY OF THIS METHODOLOGY IS >10 mIU/mL. Performed at [facility] [address], [place], [place] [address]   Admission on 04/09/2024, Discharged on 04/10/2024  Component Date Value Ref Range Status   Sodium 04/09/2024 139  135 - 145 mmol/L Final   Potassium 04/09/2024 3.7  3.5 - 5.1 mmol/L Final   Chloride 04/09/2024 103  98 - 111 mmol/L Final   CO2 04/09/2024 24  22 - 32 mmol/L Final   Glucose, Bld 04/09/2024 91  70 - 99 mg/dL Final   Glucose reference range applies only to samples taken after fasting for at least 8 hours.   BUN 04/09/2024 18  6 - 20 mg/dL Final  Creatinine, Ser 04/09/2024 1.02 (H)  0.44 - 1.00 mg/dL Final   Calcium 93/73/7974 9.2  8.9 - 10.3 mg/dL Final   Total Protein 93/73/7974 7.1  6.5 - 8.1 g/dL Final   Albumin 93/73/7974 3.9  3.5 - 5.0 g/dL  Final   AST 93/73/7974 30  15 - 41 U/L Final   ALT 04/09/2024 39  0 - 44 U/L Final   Alkaline Phosphatase 04/09/2024 83  38 - 126 U/L Final   Total Bilirubin 04/09/2024 1.2  0.0 - 1.2 mg/dL Final   GFR, Estimated 04/09/2024 >60  >60 mL/min Final   Comment: (NOTE) Calculated using the CKD-EPI Creatinine Equation (2021)    Anion gap 04/09/2024 12  5 - 15 Final   Performed at [facility], [address], [place], [place] [address]   Alcohol, Ethyl (B) 04/09/2024 <15  <15 mg/dL Final   Comment: (NOTE) For medical purposes only. Performed at [facility], [address], [place], [place] [address]    Opiates 04/09/2024 NONE DETECTED  NONE DETECTED Final   Cocaine 04/09/2024 NONE DETECTED  NONE DETECTED Final   Benzodiazepines 04/09/2024 POSITIVE (A)  NONE DETECTED Final   Amphetamines 04/09/2024 NONE DETECTED  NONE DETECTED Final   Tetrahydrocannabinol 04/09/2024 NONE DETECTED  NONE DETECTED Final   Barbiturates 04/09/2024 NONE DETECTED  NONE DETECTED Final   Comment: (NOTE) DRUG SCREEN FOR MEDICAL PURPOSES ONLY.  IF CONFIRMATION IS NEEDED FOR ANY PURPOSE, NOTIFY LAB WITHIN 5 DAYS.  LOWEST DETECTABLE LIMITS FOR URINE DRUG SCREEN Drug Class                     Cutoff (ng/mL) Amphetamine and metabolites    1000 Barbiturate and metabolites    200 Benzodiazepine                 200 Opiates and metabolites        300 Cocaine and metabolites        300 THC                            50 Performed at [facility], [address], [place], [place] [address]    WBC 04/09/2024 11.3 (H)  4.0 - 10.5 K/uL Final   RBC 04/09/2024 4.72  3.87 - 5.11 MIL/uL Final   Hemoglobin 04/09/2024 14.4  12.0 - 15.0 g/dL Final   HCT 93/73/7974 43.4  36.0 - 46.0 % Final   MCV 04/09/2024 91.9  80.0 - 100.0 fL Final   MCH 04/09/2024 30.5  26.0 - 34.0 pg Final   MCHC 04/09/2024 33.2  30.0 - 36.0 g/dL Final   RDW 93/73/7974 12.2  11.5 - 15.5 % Final    Platelets 04/09/2024 295  150 - 400 K/uL Final   nRBC 04/09/2024 0.0  0.0 - 0.2 % Final   Neutrophils Relative % 04/09/2024 68  % Final   Neutro Abs 04/09/2024 7.6  1.7 - 7.7 K/uL Final   Lymphocytes Relative 04/09/2024 26  % Final   Lymphs Abs 04/09/2024 3.0  0.7 - 4.0 K/uL Final   Monocytes Relative 04/09/2024 5  % Final   Monocytes Absolute 04/09/2024 0.6  0.1 - 1.0 K/uL Final   Eosinophils Relative 04/09/2024 1  % Final   Eosinophils Absolute 04/09/2024 0.1  0.0 - 0.5 K/uL Final   Basophils Relative 04/09/2024 0  % Final   Basophils Absolute 04/09/2024 0.0  0.0 -  0.1 K/uL Final   Immature Granulocytes 04/09/2024 0  % Final   Abs Immature Granulocytes 04/09/2024 0.04  0.00 - 0.07 K/uL Final   Performed at [facility], [address], [place], [place] [address]   Preg, Serum 04/09/2024 NEGATIVE  NEGATIVE Final   Comment:        THE SENSITIVITY OF THIS METHODOLOGY IS >10 mIU/mL. Performed at [facility], [address] [place], [place] [address]    Color, Urine 04/09/2024 YELLOW  YELLOW Final   APPearance 04/09/2024 HAZY (A)  CLEAR Final   Specific Gravity, Urine 04/09/2024 1.031 (H)  1.005 - 1.030 Final   pH 04/09/2024 5.0  5.0 - 8.0 Final   Glucose, UA 04/09/2024 NEGATIVE  NEGATIVE mg/dL Final   Hgb urine dipstick 04/09/2024 SMALL (A)  NEGATIVE Final   Bilirubin Urine 04/09/2024 NEGATIVE  NEGATIVE Final   Ketones, ur 04/09/2024 NEGATIVE  NEGATIVE mg/dL Final   Protein, ur 93/73/7974 NEGATIVE  NEGATIVE mg/dL Final   Nitrite 93/73/7974 NEGATIVE  NEGATIVE Final   Leukocytes,Ua 04/09/2024 TRACE (A)  NEGATIVE Final   RBC / HPF 04/09/2024 0-5  0 - 5 RBC/hpf Final   WBC, UA 04/09/2024 0-5  0 - 5 WBC/hpf Final   Bacteria, UA 04/09/2024 RARE (A)  NONE SEEN Final   Squamous Epithelial / HPF 04/09/2024 0-5  0 - 5 /HPF Final   Mucus 04/09/2024 PRESENT   Final   Hyaline Casts, UA 04/09/2024 PRESENT   Final   Performed at [facility], [address] [place], [place] [address]   Magnesium  04/09/2024 2.0  1.7 - 2.4 mg/dL Final   Performed at [facility], [address] [place], [place] [address]  Admission on 03/16/2024, Discharged on 03/17/2024  Component Date Value Ref Range Status   WBC 03/16/2024 8.5  4.0 - 10.5 K/uL Final   RBC 03/16/2024 4.44  3.87 - 5.11 MIL/uL Final   Hemoglobin 03/16/2024 13.8  12.0 - 15.0 g/dL Final   HCT 93/97/7974 41.2  36.0 - 46.0 % Final   MCV 03/16/2024 92.8  80.0 - 100.0 fL Final   MCH 03/16/2024 31.1  26.0 - 34.0 pg Final   MCHC 03/16/2024 33.5  30.0 - 36.0 g/dL Final   RDW 93/97/7974 12.6  11.5 - 15.5 % Final   Platelets 03/16/2024 266  150 - 400 K/uL Final   nRBC 03/16/2024 0.0  0.0 - 0.2 % Final   Neutrophils Relative % 03/16/2024 56  % Final   Neutro Abs 03/16/2024 4.8  1.7 - 7.7 K/uL Final   Lymphocytes Relative 03/16/2024 34  % Final   Lymphs Abs 03/16/2024 2.8  0.7 - 4.0 K/uL Final   Monocytes Relative 03/16/2024 7  % Final   Monocytes Absolute 03/16/2024 0.6  0.1 - 1.0 K/uL Final   Eosinophils Relative 03/16/2024 2  % Final   Eosinophils Absolute 03/16/2024 0.2  0.0 - 0.5 K/uL Final   Basophils Relative 03/16/2024 1  % Final   Basophils Absolute 03/16/2024 0.0  0.0 - 0.1 K/uL Final   Immature Granulocytes 03/16/2024 0  % Final   Abs Immature Granulocytes 03/16/2024 0.03  0.00 - 0.07 K/uL Final   Performed at [facility], [address], [place], [place] [address]   Sodium 03/16/2024 141  135 - 145 mmol/L Final   Potassium 03/16/2024 3.9  3.5 - 5.1 mmol/L Final   Chloride 03/16/2024 107  98 - 111 mmol/L Final  CO2 03/16/2024 25  22 - 32 mmol/L Final   Glucose, Bld 03/16/2024 113 (H)  70 - 99 mg/dL Final   Glucose reference range applies only to samples taken after fasting for at least 8 hours.   BUN 03/16/2024 19  6 - 20 mg/dL Final   Creatinine, Ser 03/16/2024 1.24 (H)  0.44 - 1.00 mg/dL Final   Calcium 93/97/7974 9.2  8.9 - 10.3  mg/dL Final   Total Protein 93/97/7974 5.8 (L)  6.5 - 8.1 g/dL Final   Albumin 93/97/7974 3.5  3.5 - 5.0 g/dL Final   AST 93/97/7974 23  15 - 41 U/L Final   ALT 03/16/2024 39  0 - 44 U/L Final   Alkaline Phosphatase 03/16/2024 76  38 - 126 U/L Final   Total Bilirubin 03/16/2024 0.5  0.0 - 1.2 mg/dL Final   GFR, Estimated 03/16/2024 57 (L)  >60 mL/min Final   Comment: (NOTE) Calculated using the CKD-EPI Creatinine Equation (2021)    Anion gap 03/16/2024 9  5 - 15 Final   Performed at [facility], [address], [place], [place] [address]   Hgb A1c MFr Bld 03/16/2024 4.6 (L)  4.8 - 5.6 % Final   Comment: (NOTE) Diagnosis of Diabetes The following HbA1c ranges recommended by the American Diabetes Association (ADA) may be used as an aid in the diagnosis of diabetes mellitus.  Hemoglobin             Suggested A1C NGSP%              Diagnosis  <5.7                   Non Diabetic  5.7-6.4                Pre-Diabetic  >6.4                   Diabetic  <7.0                   Glycemic control for                       adults with diabetes.     Mean Plasma Glucose 03/16/2024 85.32  mg/dL Final   Performed at [facility], [address] [place], [place] [address]   Cholesterol 03/16/2024 169  0 - 200 mg/dL Final   Triglycerides 93/97/7974 127  <150 mg/dL Final   HDL 93/97/7974 38 (L)  >40 mg/dL Final   Total CHOL/HDL Ratio 03/16/2024 4.4  RATIO Final   VLDL 03/16/2024 25  0 - 40 mg/dL Final   LDL Cholesterol 03/16/2024 106 (H)  0 - 99 mg/dL Final   Comment:        Total Cholesterol/HDL:CHD Risk Coronary Heart Disease Risk Table                     Men   Women  1/2 Average Risk   3.4   3.3  Average Risk       5.0   4.4  2 X Average Risk   9.6   7.1  3 X Average Risk  23.4   11.0        Use the calculated Patient Ratio above and the CHD Risk Table to determine the patient's CHD Risk.        ATP III CLASSIFICATION (LDL):  <100     mg/dL  Optimal  100-129   mg/dL   Near or Above                    Optimal  130-159  mg/dL   Borderline  839-810  mg/dL   High  >809     mg/dL   Very High Performed at [facility], [address] [place], [place] [address]    TSH 03/16/2024 2.009  0.350 - 4.500 uIU/mL Final   Comment: Performed by a 3rd Generation assay with a functional sensitivity of <=0.01 uIU/mL. Performed at [facility], [address], [place], [place] [address]   Admission on 02/25/2024, Discharged on 02/26/2024  Component Date Value Ref Range Status   Sodium 02/25/2024 140  135 - 145 mmol/L Final   Potassium 02/25/2024 3.5  3.5 - 5.1 mmol/L Final   Chloride 02/25/2024 104  98 - 111 mmol/L Final   CO2 02/25/2024 22  22 - 32 mmol/L Final   Glucose, Bld 02/25/2024 129 (H)  70 - 99 mg/dL Final   Glucose reference range applies only to samples taken after fasting for at least 8 hours.   BUN 02/25/2024 11  6 - 20 mg/dL Final   Creatinine, Ser 02/25/2024 0.88  0.44 - 1.00 mg/dL Final   Calcium 94/86/7974 9.3  8.9 - 10.3 mg/dL Final   Total Protein 94/86/7974 7.3  6.5 - 8.1 g/dL Final   Albumin 94/86/7974 4.0  3.5 - 5.0 g/dL Final   AST 94/86/7974 29  15 - 41 U/L Final   ALT 02/25/2024 43  0 - 44 U/L Final   Alkaline Phosphatase 02/25/2024 75  38 - 126 U/L Final   Total Bilirubin 02/25/2024 1.0  0.0 - 1.2 mg/dL Final   GFR, Estimated 02/25/2024 >60  >60 mL/min Final   Comment: (NOTE) Calculated using the CKD-EPI Creatinine Equation (2021)    Anion gap 02/25/2024 14  5 - 15 Final   Performed at [facility], [address], [place], [place] [address]   Alcohol, Ethyl (B) 02/25/2024 <15  <15 mg/dL Final   Comment: Please note change in reference range. (NOTE) For medical purposes only. Performed at [facility], [address], [place], [place] [address]    WBC 02/25/2024 11.0 (H)  4.0 - 10.5 K/uL Final   RBC 02/25/2024 5.04  3.87 - 5.11 MIL/uL Final   Hemoglobin 02/25/2024 15.6 (H)  12.0 - 15.0 g/dL Final    HCT 94/86/7974 46.1 (H)  36.0 - 46.0 % Final   MCV 02/25/2024 91.5  80.0 - 100.0 fL Final   MCH 02/25/2024 31.0  26.0 - 34.0 pg Final   MCHC 02/25/2024 33.8  30.0 - 36.0 g/dL Final   RDW 94/86/7974 12.8  11.5 - 15.5 % Final   Platelets 02/25/2024 313  150 - 400 K/uL Final   nRBC 02/25/2024 0.0  0.0 - 0.2 % Final   Performed at [facility], [address] [place], [place] [address]   Opiates 02/25/2024 NONE DETECTED  NONE DETECTED Final   Cocaine 02/25/2024 NONE DETECTED  NONE DETECTED Final   Benzodiazepines 02/25/2024 NONE DETECTED  NONE DETECTED Final   Amphetamines 02/25/2024 NONE DETECTED  NONE DETECTED Final   Tetrahydrocannabinol 02/25/2024 NONE DETECTED  NONE DETECTED Final   Barbiturates 02/25/2024 NONE DETECTED  NONE DETECTED Final   Comment: (NOTE) DRUG SCREEN FOR MEDICAL PURPOSES ONLY.  IF CONFIRMATION IS NEEDED FOR ANY PURPOSE, NOTIFY LAB WITHIN 5  DAYS.  LOWEST DETECTABLE LIMITS FOR URINE DRUG SCREEN Drug Class                     Cutoff (ng/mL) Amphetamine and metabolites    1000 Barbiturate and metabolites    200 Benzodiazepine                 200 Opiates and metabolites        300 Cocaine and metabolites        300 THC                            50 Performed at [facility], [address], [place], [place] [address]    Preg, Serum 02/25/2024 NEGATIVE  NEGATIVE Final   Comment:        THE SENSITIVITY OF THIS METHODOLOGY IS >10 mIU/mL. Performed at [facility], [address], [place], [place] [address]    Acetaminophen  (Tylenol ), Serum 02/25/2024 <10 (L)  10 - 30 ug/mL Final   Comment: (NOTE) Therapeutic concentrations vary significantly. A range of 10-30 ug/mL  may be an effective concentration for many patients. However, some  are best treated at concentrations outside of this range. Acetaminophen  concentrations >150 ug/mL at 4 hours after ingestion  and >50 ug/mL at 12 hours after ingestion are often associated with  toxic  reactions.  Performed at [facility], [address] [place], [place] [address]    Salicylate Lvl 02/25/2024 <7.0 (L)  7.0 - 30.0 mg/dL Final   Performed at [facility], [address] [place], [place] [address]  Admission on 02/21/2024, Discharged on 02/21/2024  Component Date Value Ref Range Status   WBC 02/21/2024 7.5  4.0 - 10.5 K/uL Final   RBC 02/21/2024 4.44  3.87 - 5.11 MIL/uL Final   Hemoglobin 02/21/2024 13.7  12.0 - 15.0 g/dL Final   HCT 94/90/7974 40.1  36.0 - 46.0 % Final   MCV 02/21/2024 90.3  80.0 - 100.0 fL Final   MCH 02/21/2024 30.9  26.0 - 34.0 pg Final   MCHC 02/21/2024 34.2  30.0 - 36.0 g/dL Final   RDW 94/90/7974 13.1  11.5 - 15.5 % Final   Platelets 02/21/2024 235  150 - 400 K/uL Final   nRBC 02/21/2024 0.0  0.0 - 0.2 % Final   Performed at [facility], [address] [place], [place] [address]   Sodium 02/21/2024 143  135 - 145 mmol/L Final   Potassium 02/21/2024 3.9  3.5 - 5.1 mmol/L Final   Chloride 02/21/2024 106  98 - 111 mmol/L Final   CO2 02/21/2024 26  22 - 32 mmol/L Final   Glucose, Bld 02/21/2024 100 (H)  70 - 99 mg/dL Final   Glucose reference range applies only to samples taken after fasting for at least 8 hours.   BUN 02/21/2024 14  6 - 20 mg/dL Final   Creatinine, Ser 02/21/2024 0.95  0.44 - 1.00 mg/dL Final   Calcium 94/90/7974 9.3  8.9 - 10.3 mg/dL Final   Total Protein 94/90/7974 6.3 (L)  6.5 - 8.1 g/dL Final   Albumin 94/90/7974 3.9  3.5 - 5.0 g/dL Final   AST 94/90/7974 28  15 - 41 U/L Final   ALT 02/21/2024 42  0 - 44 U/L Final   Alkaline Phosphatase 02/21/2024 92  38 - 126 U/L Final   Total Bilirubin 02/21/2024 0.5  0.0 -  1.2 mg/dL Final   GFR, Estimated 02/21/2024 >60  >60 mL/min Final   Comment: (NOTE) Calculated using the CKD-EPI Creatinine Equation (2021)    Anion gap 02/21/2024 11  5 - 15 Final   Performed at [facility], [address] [place], [place] [address]   TSH  02/21/2024 2.170  0.350 - 4.500 uIU/mL Final   Performed at [facility], [address], [place], [place] [address]   Preg, Serum 02/21/2024 NEGATIVE  NEGATIVE Final   Comment:        THE SENSITIVITY OF THIS METHODOLOGY IS >10 mIU/mL. Performed at [facility], [address], [place], [place] [address]   Admission on 02/20/2024, Discharged on 02/20/2024  Component Date Value Ref Range Status   Color, UA 02/20/2024 yellow  yellow Final   Clarity, UA 02/20/2024 clear  clear Final   Glucose, UA 02/20/2024 negative  negative mg/dL Final   Bilirubin, UA 94/91/7974 negative  negative Final   Ketones, POC UA 02/20/2024 negative  negative mg/dL Final   Spec Grav, UA 94/91/7974 1.025  1.010 - 1.025 Final   Blood, UA 02/20/2024 trace-lysed (A)  negative Final   pH, UA 02/20/2024 6.0  5.0 - 8.0 Final   Protein Ur, POC 02/20/2024 negative  negative mg/dL Final   Urobilinogen, UA 02/20/2024 1.0  0.2 or 1.0 E.U./dL Final   Nitrite, UA 94/91/7974 Negative  Negative Final   Leukocytes, UA 02/20/2024 Negative  Negative Final   Preg Test, Ur 02/20/2024 Negative  Negative Final    Blood Alcohol level:  Lab Results  Component Value Date   [facility] <15 06/03/2024   ETH <15 04/21/2024    Metabolic Disorder Labs: Lab Results  Component Value Date   HGBA1C 4.6 (L) 03/16/2024   MPG 85.32 03/16/2024   MPG 88.19 11/20/2022   Lab Results  Component Value Date   PROLACTIN 6.2 11/20/2022   Lab Results  Component Value Date   CHOL 169 03/16/2024   TRIG 127 03/16/2024   HDL 38 (L) 03/16/2024   CHOLHDL 4.4 03/16/2024   VLDL 25 03/16/2024   LDLCALC 106 (H) 03/16/2024   LDLCALC 118 (H) 11/20/2022    Therapeutic Lab Levels: No results found for: LITHIUM No results found for: VALPROATE No results found for: CBMZ  Physical Findings   GAD-7    Flowsheet Row Office Visit from 09/14/2020 in [facility] Dept Of [facility]  Total GAD-7 Score 21   PHQ2-9    Flowsheet Row Office Visit from 02/06/2023 in [facility] [place] Office Visit from 10/26/2022 in [facility] [place] Nutrition from 07/30/2022 in [facility]  - A Dept Of [place] Clinical Support from 07/10/2022 in [facility] [place] Office Visit from 06/15/2022 in [facility] [place] Total Score 0 6 4 1 5   PHQ-9 Total Score 0 20 -- -- 18   Flowsheet Row ED from 06/11/2024 in [facility] ED from 06/09/2024 in [facility] UC from 06/08/2024 in [facility] at [facility])  C-SSRS RISK CATEGORY High Risk Moderate Risk Error: Question 6 not populated     Musculoskeletal  Strength & Muscle Tone: within normal limits Gait & Station: normal Patient leans: N/A  Psychiatric Specialty Exam  Presentation  General Appearance:  Casual  Eye Contact: Good  Speech: Clear and Coherent  Speech Volume: Normal  Handedness: Right   Mood and Affect  Mood: Anxious  Affect: Congruent   Thought Process  Thought Processes: Coherent  Descriptions of Associations:Intact  Orientation:Full (Time, Place and Person)  Thought Content: ruminations on self harming  Diagnosis of Schizophrenia or Schizoaffective disorder in past: No  Duration of Psychotic Symptoms: Greater than six months   Hallucinations: none Ideas of Reference:None  Suicidal Thoughts: conditional SI with intention if she goes home. Having passive SI currently.  Homicidal Thoughts: none  Sensorium  Memory: Immediate Fair  Judgment: Poor  Insight: Lacking   Executive Functions  Concentration: Fair  Attention Span: Fair  Recall: [employer] of Knowledge: Fair  Language: Fair   Psychomotor Activity  Psychomotor  Activity:No data recorded  Assets  Assets: Desire for Improvement; Resilience; Vocational/Educational   Sleep  Sleep: 9 hrs    Physical Exam  Physical Exam Vitals and nursing note reviewed.  Constitutional:      General: She is not in acute distress. HENT:     Head: Normocephalic and atraumatic.  Pulmonary:     Effort: Pulmonary effort is normal.  Neurological:     General: No focal deficit present.     Mental Status: She is alert.    Review of Systems  Gastrointestinal:  Negative for constipation, diarrhea, nausea and vomiting.  Musculoskeletal:  Positive for back pain and joint pain.   Blood pressure 116/61, pulse 71, temperature 98 F (36.7 C), temperature source Oral, resp. rate 18, SpO2 97%. There is no height or weight on file to calculate BMI.  Treatment Plan Summary: Tamura Lasky is a 38 y.o. female w/ hx of borderline personality disorder, PTSD, panic disorder, chronic L achilles tear since 2019, chronic back pain including numerous neurosurgical procedures who presents with nonsuicidal self harm by cutting and suicidal thoughts without plan or intent but saying she wants to kill herself if she does not get help.  This is a patient with chronic suicidality and self-harming to more recently has been declining in her functioning and self-harming and worsening degrees to the point that she is causing substantial harm and now is having worsening suicidal thoughts with intent given her inability to receive adequate treatment.  Patient has been escalated to all conventional treatments possible.  Patient is unable to go to inpatient treatments because she is not expected to have good response given her chronic personality disorder, and more over because she hates exclusionary criteria with her medical issues.  Patient has been set up with partial hospitalization in the community and has failed this.  Patient has been set up with numerous therapist and has failed these.  Patient  follows up with outpatient psychiatrist and continues to present to emergency departments and escalate her self-harming.  Ideally patient would be a good candidate for group home of some sort where they can provide her meal so that she does not have access to knives (which she needs for cooking) and to watch over her.  Alongside this patient would be good candidate for CST or ACTT.  However this is limited by the fact that patient has Medicare.  If patient is established with Medicaid could trial patient on these resources above but if she fails these then patient likely will need to have her competency assessed in court of law and considerations for a legal guardian and placement in group home.   Patient will remain  in the observation unit until she can vocalize openness to safety planning and returning home or until more resources can be leveraged for patient to transfer her to a higher level of care including CST/ACTT and supervised support in the community.    continue home medications Abilify  10 mg once daily for MDD Vitamin D  50,000 units once weekly on Fridays Escitalopram  10 mg once daily for MDD Hydroxyzine  25 mg 3 times daily as needed for anxiety Lorazepam  0.5 mg every 6 hours for panic attacks (confirmed w/ PDMP) Robaxin  500 mg twice daily as needed for muscle spasms Neosporin as needed for wound care Polyethylene glycol once daily as needed for constipation Senokot once daily as needed for constipation   Justino Cornish, MD 06/12/2024 11:04 AM

## 2024-06-12 NOTE — ED Provider Notes (Signed)
 FBC/OBS ASAP Discharge Summary  Date and Time: 06/12/2024 6:03 PM  Name: Rebekah Davis  MRN:  [id number]   Discharge Diagnoses:  Final diagnoses:  Severe episode of recurrent major depressive disorder, without psychotic features [facility])   HPI:  Rebekah Davis is a 38 y.o. female w/ hx of borderline personality disorder, PTSD, panic disorder, chronic L achilles tear since 2019, chronic back pain including numerous neurosurgical procedures who presents with nonsuicidal self harm by cutting and suicidal thoughts without plan or intent but saying she wants to kill herself if she does not get help.   Subjective:  After extensive planning with APS and ACT team, patient is now feeling more hopeful and says that she is not having suicidal thoughts.  Also says that she feels like she can go without self-harming.  Says that she has almost scheduled hydroxyzine  and has found this helpful for her mental health and thinks she will continue this.  Says that she will return to [facility] with any acute suicidal thoughts.  Per Ava Elouise, [facility] coordinator, note:  Writer consulted with Dr. Justino regarding the patient.  Writer referred the patient to [facility] for [facility] for (CST/ACTT) services.  Due to the patient having Medicare she will not qualify for services unless her Medicare is switched to Medicaid.     Writer left a HIPAA compliant voice mail message with the APS worker Arch Ada [phone] regarding the status of the APS case.      11am  APS worker  Arch Ada [phone] came to the [facility] and met with the patient regarding the APS case.  The APS worker is able to complete the application in order for the patient to have her Medicare insurance switched to [employer].  Writer has also requested for the patient to have a [facility].      12pm  Patient was able to meet with the intake worker with [facility] [phone] Sharlie Blunt).  Per the intake worker they  were able to complete the CST/ACTT intake forms and pending the transfer from Medicare to Medicaid they will be able to start services with the patient.    The intake worker from [facility] was able to meet with APS worker and they will collaborate together to ensure that the patient Medicare is switched to Medicaid so that the patient is able to receive services.     [facility] will be able to follow up via phone contact check ins until the patients Medicaid is active and services are authorized.     Writer informed the Dr. Kiamesha Samet and the RN working with the patient.    1:30pm  Writer left a HIPAA compliant voicemail in order to make a referral for Peer Support Services with [facility] Lead Peer Support: Harlene Garbe [phone]    Stay Summary: Patient was appropriate on unit.  APS followed up on report prior to admission.  APS started process of transitioning patient to Medicaid to allow her access to more resources.  ACT team also established with patient today and says that they will follow up with patient on weekly basis.  APS will also follow up patient on a weekly basis.  Suspect that Medicaid will take 1 month to have affect.  Patient denies suicidal thoughts for several hours prior to discharge.  She is more hopeful now that resources are in place for her.    Originally patient was going to be recommended for inpatient and faxed out but after discussion with  interdisciplinary team at behavior health meeting determined that patient is exceptionally unlikely to get accepted so did not make sense to fax out (mostly due to patient having exclusionary criteria with her medical issues).  Total Time spent with patient: 30 minutes  Past Psychiatric History: borderline personality disorder, PTSD, panic disorder ; decline from hospitalizations due to medical comorbidities these and borderline personality disorder Past Medical History: PCOS, hx scarlet fever, chronic L partial achilles tear,  chronic back pain from numerous spine conditions and surgeries  Family History: family history includes Depression in her mother; Diabetes in her father and mother; Heart disease in her paternal grandmother; Hypertension in her father and mother; Kidney disease in her mother; Other in her maternal grandmother; Ovarian cancer in her maternal grandmother; Stroke in her paternal grandfather; Thyroid  disease in her mother. Family Psychiatric  History: no pertinent psych history.  Social History: Patient lives by herself in an apartment. Patient is not current employed but receives disability. Denies any substance use.  Does not have any social support except for a friend that lives in [place] . Tobacco Cessation:  N/A, patient does not currently use tobacco products  Current Medications:  Current Facility-Administered Medications  Medication Dose Route Frequency Provider Last Rate Last Admin   acetaminophen  (TYLENOL ) tablet 650 mg  650 mg Oral Q6H PRN Edra Riccardi, MD   650 mg at 06/12/24 1241   alum & mag hydroxide-simeth (MAALOX/MYLANTA) 200-200-20 MG/5ML suspension 30 mL  30 mL Oral Q4H PRN Abryana Lykens, MD       ARIPiprazole  (ABILIFY ) tablet 10 mg  10 mg Oral Daily Birdia Jaycox, MD   10 mg at 06/12/24 9090   haloperidol  (HALDOL ) tablet 5 mg  5 mg Oral TID PRN Taralyn Ferraiolo, MD       And   diphenhydrAMINE  (BENADRYL ) capsule 50 mg  50 mg Oral TID PRN Kikue Gerhart, MD       haloperidol  lactate (HALDOL ) injection 5 mg  5 mg Intramuscular TID PRN Orhan Mayorga, MD       And   diphenhydrAMINE  (BENADRYL ) injection 50 mg  50 mg Intramuscular TID PRN Madissen Wyse, MD       And   LORazepam  (ATIVAN ) injection 2 mg  2 mg Intramuscular TID PRN Kolbie Lepkowski, MD       haloperidol  lactate (HALDOL ) injection 10 mg  10 mg Intramuscular TID PRN Altheia Shafran, MD       And   diphenhydrAMINE  (BENADRYL ) injection 50 mg  50 mg Intramuscular TID PRN Rumaldo Difatta, MD       And   LORazepam  (ATIVAN )  injection 2 mg  2 mg Intramuscular TID PRN Sarae Nicholes, MD       escitalopram  (LEXAPRO ) tablet 10 mg  10 mg Oral Daily Milcah Dulany, MD   10 mg at 06/12/24 9090   hydrOXYzine  (ATARAX ) tablet 25 mg  25 mg Oral TID PRN Oriyah Lamphear, MD   25 mg at 06/12/24 1525   LORazepam  (ATIVAN ) tablet 0.5 mg  0.5 mg Oral Q6H PRN Braelyn Jenson, MD   0.5 mg at 06/12/24 9155   magnesium  hydroxide (MILK OF MAGNESIA) suspension 30 mL  30 mL Oral Daily PRN Kayron Kalmar, MD       methocarbamol  (ROBAXIN ) tablet 500 mg  500 mg Oral BID PRN Noora Locascio, MD       neomycin -bacitracin -polymyxin (NEOSPORIN) ointment 1 Application  1 Application Topical PRN Mackenzie Groom, MD   1 Application at 06/12/24 0516   polyethylene glycol (MIRALAX  /  GLYCOLAX ) packet 17 g  17 g Oral Daily PRN Nakeem Murnane, MD       senna-docusate (Senokot-S) tablet 1 tablet  1 tablet Oral Daily PRN Bryleigh Ottaway, MD       traZODone  (DESYREL ) tablet 50 mg  50 mg Oral QHS PRN Kylo Gavin, MD       Vitamin D  (Ergocalciferol ) (DRISDOL ) 1.25 MG (50000 UNIT) capsule 50,000 Units  50,000 Units Oral Weekly Tyquisha Sharps, MD   50,000 Units at 06/12/24 0909   Current Outpatient Medications  Medication Sig Dispense Refill   acetaminophen  (TYLENOL ) 500 MG tablet Take 1,000 mg by mouth 3 (three) times daily as needed for mild pain (pain score 1-3) or moderate pain (pain score 4-6).     ARIPiprazole  (ABILIFY ) 10 MG tablet Take 10 mg by mouth daily.     ergocalciferol  (VITAMIN D2) 1.25 MG (50000 UT) capsule Take 1 capsule (50,000 Units total) by mouth once a week. (Patient taking differently: Take 50,000 Units by mouth once a week. On Fridays) 4 capsule 2   escitalopram  (LEXAPRO ) 10 MG tablet Take 1 tablet (10 mg total) by mouth at bedtime. (Patient taking differently: Take 10 mg by mouth in the morning.) 30 tablet 0   hydrOXYzine  (ATARAX ) 25 MG tablet Take 1 tablet (25 mg total) by mouth 3 (three) times daily as needed for anxiety. 30 tablet 0    levonorgestrel (MIRENA, 52 MG,) 20 MCG/DAY IUD 1 each by Intrauterine route once.     LORazepam  (ATIVAN ) 1 MG tablet Take 1 mg by mouth at bedtime as needed for sleep.     methocarbamol  (ROBAXIN ) 500 MG tablet Take 500 mg by mouth 2 (two) times daily as needed for muscle spasms.     neomycin -bacitracin -polymyxin (NEOSPORIN) OINT Apply 1 Application topically as needed for wound care.     polyethylene glycol powder (GLYCOLAX /MIRALAX ) 17 GM/SCOOP powder Take 17 g by mouth daily as needed for mild constipation or moderate constipation.     Scar Treatment Products [facility] ADVANCED SCAR GEL EX) Apply 1 Application topically daily.     sennosides-docusate sodium  (SENOKOT-S) 8.6-50 MG tablet Take 1 tablet by mouth as needed for constipation.      PTA Medications:  PTA Medications  Medication Sig   ergocalciferol  (VITAMIN D2) 1.25 MG (50000 UT) capsule Take 1 capsule (50,000 Units total) by mouth once a week. (Patient taking differently: Take 50,000 Units by mouth once a week. On Fridays)   levonorgestrel (MIRENA, 52 MG,) 20 MCG/DAY IUD 1 each by Intrauterine route once.   methocarbamol  (ROBAXIN ) 500 MG tablet Take 500 mg by mouth 2 (two) times daily as needed for muscle spasms.   polyethylene glycol powder (GLYCOLAX /MIRALAX ) 17 GM/SCOOP powder Take 17 g by mouth daily as needed for mild constipation or moderate constipation.   sennosides-docusate sodium  (SENOKOT-S) 8.6-50 MG tablet Take 1 tablet by mouth as needed for constipation.   escitalopram  (LEXAPRO ) 10 MG tablet Take 1 tablet (10 mg total) by mouth at bedtime. (Patient taking differently: Take 10 mg by mouth in the morning.)   hydrOXYzine  (ATARAX ) 25 MG tablet Take 1 tablet (25 mg total) by mouth 3 (three) times daily as needed for anxiety.   ARIPiprazole  (ABILIFY ) 10 MG tablet Take 10 mg by mouth daily.   Scar Treatment Products [facility] ADVANCED SCAR GEL EX) Apply 1 Application topically daily.   neomycin -bacitracin -polymyxin (NEOSPORIN) OINT  Apply 1 Application topically as needed for wound care.   acetaminophen  (TYLENOL ) 500 MG tablet Take 1,000 mg by  mouth 3 (three) times daily as needed for mild pain (pain score 1-3) or moderate pain (pain score 4-6).   LORazepam  (ATIVAN ) 1 MG tablet Take 1 mg by mouth at bedtime as needed for sleep.   Facility Ordered Medications  Medication   acetaminophen  (TYLENOL ) tablet 650 mg   alum & mag hydroxide-simeth (MAALOX/MYLANTA) 200-200-20 MG/5ML suspension 30 mL   magnesium  hydroxide (MILK OF MAGNESIA) suspension 30 mL   haloperidol  (HALDOL ) tablet 5 mg   And   diphenhydrAMINE  (BENADRYL ) capsule 50 mg   haloperidol  lactate (HALDOL ) injection 5 mg   And   diphenhydrAMINE  (BENADRYL ) injection 50 mg   And   LORazepam  (ATIVAN ) injection 2 mg   haloperidol  lactate (HALDOL ) injection 10 mg   And   diphenhydrAMINE  (BENADRYL ) injection 50 mg   And   LORazepam  (ATIVAN ) injection 2 mg   hydrOXYzine  (ATARAX ) tablet 25 mg   traZODone  (DESYREL ) tablet 50 mg   ARIPiprazole  (ABILIFY ) tablet 10 mg   Vitamin D  (Ergocalciferol ) (DRISDOL ) 1.25 MG (50000 UNIT) capsule 50,000 Units   escitalopram  (LEXAPRO ) tablet 10 mg   methocarbamol  (ROBAXIN ) tablet 500 mg   neomycin -bacitracin -polymyxin (NEOSPORIN) ointment 1 Application   polyethylene glycol (MIRALAX  / GLYCOLAX ) packet 17 g   senna-docusate (Senokot-S) tablet 1 tablet   LORazepam  (ATIVAN ) tablet 0.5 mg       02/06/2023    3:34 PM 10/26/2022    3:18 PM 07/30/2022    4:46 PM  Depression screen PHQ 2/9  Decreased Interest 0 3 2  Down, Depressed, Hopeless 0 3 2  PHQ - 2 Score 0 6 4  Altered sleeping 0 3   Tired, decreased energy 0 3   Change in appetite 0 2   Feeling bad or failure about yourself  0 2   Trouble concentrating 0 3   Moving slowly or fidgety/restless 0 0   Suicidal thoughts 0 1   PHQ-9 Score 0 20   Difficult doing work/chores Not difficult at all Very difficult Very difficult    Flowsheet Row ED from 06/11/2024 in [facility] ED from 06/09/2024 in [facility] UC from 06/08/2024 in [facility] at [facility])  C-SSRS RISK CATEGORY High Risk Moderate Risk Error: Question 6 not populated    Musculoskeletal  Strength & Muscle Tone: within normal limits Gait & Station: normal Patient leans: N/A  Psychiatric Specialty Exam  Presentation  General Appearance:  Appropriate for Environment  Eye Contact: Good  Speech: Normal Rate  Speech Volume: Normal  Handedness: Right   Mood and Affect  Mood: Euthymic  Affect: Appropriate; Congruent   Thought Process  Thought Processes: Coherent  Descriptions of Associations:Intact  Orientation:Full (Time, Place and Person)  Thought Content:Logical  Diagnosis of Schizophrenia or Schizoaffective disorder in past: No  Duration of Psychotic Symptoms: Greater than six months   Hallucinations:Hallucinations: None  Ideas of Reference:None  Suicidal Thoughts:Suicidal Thoughts: No  Homicidal Thoughts:Homicidal Thoughts: No   Sensorium  Memory: Immediate Good; Recent Good; Remote Good  Judgment: Fair  Insight: Fair   Art therapist  Concentration: Good  Attention Span: Good  Recall: Good  Fund of Knowledge: Good  Language: Good   Psychomotor Activity  Psychomotor Activity:Psychomotor Activity: Decreased   Assets  Assets: Housing; Desire for Improvement   Sleep  Sleep:Sleep: Fair     Physical Exam  Physical Exam Vitals and nursing note reviewed.  Constitutional:      General: She is not in acute  distress. HENT:     Head: Normocephalic and atraumatic.  Pulmonary:     Effort: Pulmonary effort is normal.  Skin:    Comments: Extensive supervicial cuts on bilateral forearms that healing well.   Neurological:     General: No focal deficit present.     Mental Status: She is alert.     Review of Systems  Constitutional:   Negative for fever.  Cardiovascular:  Negative for chest pain and palpitations.  Gastrointestinal:  Negative for constipation, diarrhea, nausea and vomiting.  Musculoskeletal:  Positive for joint pain.  Neurological:  Negative for dizziness, weakness and headaches.   Blood pressure 116/61, pulse 71, temperature 98 F (36.7 C), temperature source Oral, resp. rate 18, SpO2 97%. There is no height or weight on file to calculate BMI.  Demographic Factors:  Living alone and Unemployed  Loss Factors: NA  Historical Factors: Chronic suicidality in the context of borderline personality disorder stemming from childhood trauma.  Extensive self-harm history but no suicide attempts.   Risk Reduction Factors:   No access to firearms.  Extensive community support including now APS and ACT team both of which will follow-up.  Patient immediately called 911 or come to the emergency department/[facility] whenever she has any acute concerns which reduces her risk as she does not allow time to act on her thoughts prior to seeking treatment.  Continued Clinical Symptoms:  Chronic dysthymia and anxiety but are stable and manageable per patient.   Cognitive Features That Contribute To Risk:  None    Suicide Risk:  Mild:  Suicidal ideation of limited frequency, intensity, duration, and specificity.  Currently is denying suicidal thoughts after extensive safety planning with patient.  Patient is hopeful after establishing patient with APS to help get resources mobilized including getting patient Medicaid.  Patient is low risk for suicide because she immediately presents to medical system whenever she has any self-harming or suicidal thoughts and does not allow any time for her to act on thoughts prior to presenting.  Despite persistent self-harm and persistent suicidal thoughts for most of her life she has never had a suicide attempt.  At discharge patient has improvement in her affect, is forward thinking, is hopeful,  and is smiling looking forward to resources that she was offered.  Patient is very clear that she will return with any concerns (well documentd that she does).   Plan Of Care/Follow-up recommendations:  Plan for patient to get transition from Medicare to Medicaid.  Patient has been established with ACT team and will go into full affect wants Medicaid kicks in but they report that they will follow up with patient weekly.  APS will also follow up patient weekly.  Recommend that patient also get established with DBT once medicated set up.  Patient will continue current regimen and follow-up with outpatient psychiatrist for medication management.  Encouraged patient to take her hydroxy on a scheduled basis as she has found this helpful while in the [facility].    Patient denies SI/HI/AVH or paranoia. Patient does not meet inpatient psychiatric admission criteria or IVC criteria at this time. There is no evidence of imminent risk of harm to self or others.  Discharge recommendations: . Please follow up with your primary care provider for all medical related needs.  Safety: The patient should abstain from use of illicit substances/drugs and abuse of any medications.  If symptoms worsen or do not continue to improve or if the patient becomes actively suicidal or homicidal then it is  recommended that the patient return to the closest hospital emergency department, the [place] [facility], or call 911 for further evaluation and treatment.  National Suicide Prevention Lifeline 1-800-SUICIDE or 680 635 9216.  About 988 988 offers 24/7 access to trained crisis counselors who can help people experiencing mental health-related distress. People can call or text 988 or chat 988lifeline.org for themselves or if they are worried about a loved one who may need crisis support.  Crisis Mobile: [facility]: (832) 001-3693 (for crisis response 24 hours a day)  [facility] Hotline:  647-676-4486  Patient discharged home in stable condition.   Disposition: home to self, follow-up with outpatient psychiatric, APS will check in with patient weekly, ACT team will checking with patient weekly (even before medicaid is set up)   Justino Cornish, MD PGY-2 Psychiatry Resident 06/12/2024, 6:03 PM

## 2024-06-13 ENCOUNTER — Other Ambulatory Visit: Payer: Self-pay

## 2024-06-13 ENCOUNTER — Emergency Department (HOSPITAL_COMMUNITY)
Admission: EM | Admit: 2024-06-13 | Discharge: 2024-06-15 | Disposition: A | Discharge status: Home / Self Care | Attending: Emergency Medicine | Admitting: Emergency Medicine

## 2024-06-13 DIAGNOSIS — F332 Major depressive disorder, recurrent severe without psychotic features: Secondary | ICD-10-CM | POA: Diagnosis not present

## 2024-06-13 DIAGNOSIS — I10 Essential (primary) hypertension: Secondary | ICD-10-CM | POA: Diagnosis not present

## 2024-06-13 DIAGNOSIS — R45851 Suicidal ideations: Secondary | ICD-10-CM | POA: Diagnosis not present

## 2024-06-13 DIAGNOSIS — F32A Depression, unspecified: Secondary | ICD-10-CM | POA: Diagnosis present

## 2024-06-13 DIAGNOSIS — F39 Unspecified mood [affective] disorder: Secondary | ICD-10-CM | POA: Insufficient documentation

## 2024-06-13 DIAGNOSIS — F603 Borderline personality disorder: Secondary | ICD-10-CM | POA: Diagnosis not present

## 2024-06-13 DIAGNOSIS — F29 Unspecified psychosis not due to a substance or known physiological condition: Secondary | ICD-10-CM | POA: Diagnosis not present

## 2024-06-13 DIAGNOSIS — F4310 Post-traumatic stress disorder, unspecified: Secondary | ICD-10-CM | POA: Diagnosis present

## 2024-06-13 DIAGNOSIS — Z79899 Other long term (current) drug therapy: Secondary | ICD-10-CM | POA: Diagnosis not present

## 2024-06-13 DIAGNOSIS — F329 Major depressive disorder, single episode, unspecified: Secondary | ICD-10-CM | POA: Diagnosis not present

## 2024-06-13 LAB — CBC
HCT: 44 % (ref 36.0–46.0)
Hemoglobin: 14.4 g/dL (ref 12.0–15.0)
MCH: 30.6 pg (ref 26.0–34.0)
MCHC: 32.7 g/dL (ref 30.0–36.0)
MCV: 93.4 fL (ref 80.0–100.0)
Platelets: 327 K/uL (ref 150–400)
RBC: 4.71 MIL/uL (ref 3.87–5.11)
RDW: 12.4 % (ref 11.5–15.5)
WBC: 12.6 K/uL — ABNORMAL HIGH (ref 4.0–10.5)
nRBC: 0 % (ref 0.0–0.2)

## 2024-06-13 LAB — HCG, SERUM, QUALITATIVE: Preg, Serum: NEGATIVE

## 2024-06-13 NOTE — ED Triage Notes (Signed)
 38 y/o female comes in reporting she wants to stab her eyes out and die.

## 2024-06-14 DIAGNOSIS — F332 Major depressive disorder, recurrent severe without psychotic features: Secondary | ICD-10-CM

## 2024-06-14 DIAGNOSIS — F39 Unspecified mood [affective] disorder: Secondary | ICD-10-CM

## 2024-06-14 DIAGNOSIS — F4310 Post-traumatic stress disorder, unspecified: Secondary | ICD-10-CM

## 2024-06-14 LAB — URINE DRUG SCREEN
Amphetamines: NEGATIVE
Barbiturates: NEGATIVE
Benzodiazepines: NEGATIVE
Cocaine: NEGATIVE
Fentanyl: NEGATIVE
Methadone Scn, Ur: NEGATIVE
Opiates: NEGATIVE
Tetrahydrocannabinol: NEGATIVE

## 2024-06-14 LAB — COMPREHENSIVE METABOLIC PANEL WITH GFR
ALT: 25 U/L (ref 0–44)
AST: 20 U/L (ref 15–41)
Albumin: 4.2 g/dL (ref 3.5–5.0)
Alkaline Phosphatase: 112 U/L (ref 38–126)
Anion gap: 12 (ref 5–15)
BUN: 13 mg/dL (ref 6–20)
CO2: 24 mmol/L (ref 22–32)
Calcium: 9.2 mg/dL (ref 8.9–10.3)
Chloride: 105 mmol/L (ref 98–111)
Creatinine, Ser: 0.8 mg/dL (ref 0.44–1.00)
GFR, Estimated: >60 mL/min (ref >60)
Glucose, Bld: 119 mg/dL — ABNORMAL HIGH (ref 70–99)
Potassium: 4.1 mmol/L (ref 3.5–5.1)
Sodium: 140 mmol/L (ref 135–145)
Total Bilirubin: 0.3 mg/dL (ref 0.0–1.2)
Total Protein: 6.9 g/dL (ref 6.5–8.1)

## 2024-06-14 LAB — ETHANOL: Alcohol, Ethyl (B): <15 mg/dL (ref <15)

## 2024-06-14 MED ORDER — HYDROXYZINE HCL 25 MG PO TABS
25.0000 mg | ORAL_TABLET | Freq: Three times a day (TID) | ORAL | Status: DC | PRN
  Administered 2024-06-14 – 2024-06-15 (×3): 25 mg via ORAL
  Filled 2024-06-14 (×3): qty 1

## 2024-06-14 MED ORDER — LORAZEPAM 1 MG PO TABS: 1.0000 mg | ORAL_TABLET | Freq: Every evening | ORAL | Status: DC | PRN

## 2024-06-14 MED ORDER — ACETAMINOPHEN 325 MG PO TABS
650.0000 mg | ORAL_TABLET | Freq: Four times a day (QID) | ORAL | Status: DC | PRN
  Administered 2024-06-14 – 2024-06-15 (×4): 650 mg via ORAL
  Filled 2024-06-14 (×4): qty 2

## 2024-06-14 MED ORDER — ARIPIPRAZOLE 10 MG PO TABS
10.0000 mg | ORAL_TABLET | Freq: Every day | ORAL | Status: DC
  Administered 2024-06-14 – 2024-06-15 (×2): 10 mg via ORAL
  Filled 2024-06-14: qty 1
  Filled 2024-06-14: qty 2

## 2024-06-14 MED ORDER — ESCITALOPRAM OXALATE 10 MG PO TABS
10.0000 mg | ORAL_TABLET | Freq: Every morning | ORAL | Status: DC
  Administered 2024-06-14 – 2024-06-15 (×2): 10 mg via ORAL
  Filled 2024-06-14 (×2): qty 1

## 2024-06-14 NOTE — ED Notes (Signed)
 Patient to room 30. Patient can ambulate. Patient calm and cooperative.   Patient oriented to room and unit.

## 2024-06-14 NOTE — Consult Note (Signed)
 [facility] Psychiatric Consult Initial  Patient Name: .Rebekah Davis  MRN: [id number]  DOB: 05-01-1986  Consult Order details:  Orders (From admission, onward)     Start     Ordered   06/14/24 0040  CONSULT TO CALL ACT TEAM       Ordering Provider: Logan Ubaldo NOVAK, PA-C  Provider:  (Not yet assigned)  Question:  Reason for Consult?  Answer:  Psych consult   06/14/24 0040             Mode of Visit: In person    Psychiatry Consult Evaluation  Service Date: June 14, 2024 LOS:  LOS: 0 days  Chief Complaint worsening depressive symptoms and suicidal thoughts.  Primary Psychiatric Diagnoses  Borderline personality disorder PTSD 3.  Mood disorder  Assessment  Rebekah Davis is a 38 y.o. female admitted: Presented to the EDfor 06/13/2024 10:53 PM for mood disorder, borderline personality disorder, PTSD. She carries the psychiatric diagnoses of and has a past medical history of hypertension, morbid obesity, chronic left Achilles tendon tear, chronic back pain.   38 year old female with significant psychiatric history presents with chronic suicidality, borderline personality disorder, and escalating self-harming behaviors. Despite extensive prior outpatient supports (partial hospitalization, individual therapy, psychiatric medication management), patient continues to deteriorate. She is unable to identify protective factors, unable to contract for safety, and has no reliable support system. Recent events include job loss, worsening isolation, and increased frequency/severity of self-harming.  While patient historically has had limited benefit from inpatient stays, her current inability to maintain safety, chronic functional decline, and escalating self-harm warrant continued recommendation for inpatient psychiatric admission.  Please see plan below for detailed recommendations.   Diagnoses:  Active Hospital problems: Principal Problem:   MDD (major depressive disorder), recurrent  severe, without psychosis (HCC) Active Problems:   PTSD (post-traumatic stress disorder)    Plan   ## Psychiatric Medication Recommendations:  Continue home medications  ## Medical Decision Making Capacity: Not specifically addressed in this encounter  ## Further Work-up:  -- No further workup needed at this time EKG, While pt on Qtc prolonging medications, please monitor & replete K+ to 4 and Mg2+ to 2, or UDS -- most recent EKG on 06/11/2024 had QtC of 425 -- Pertinent labwork reviewed earlier this admission includes: CBC, CMP, EKG, UDS   ## Disposition:-- We recommend inpatient psychiatric hospitalization when medically cleared. Patient is under voluntary admission status at this time; please IVC if attempts to leave hospital.  ## Behavioral / Environmental: -Difficult Patient (SELECT OPTIONS FROM BELOW), To minimize splitting of staff, assign one staff person to communicate all information from the team when feasible., or Utilize compassion and acknowledge the patient's experiences while setting clear and realistic expectations for care.    ## Safety and Observation Level:  - Based on my clinical evaluation, I estimate the patient to be at low risk of self harm in the current setting. - At this time, we recommend  1:1 Observation. This decision is based on my review of the chart including patient's history and current presentation, interview of the patient, mental status examination, and consideration of suicide risk including evaluating suicidal ideation, plan, intent, suicidal or self-harm behaviors, risk factors, and protective factors. This judgment is based on our ability to directly address suicide risk, implement suicide prevention strategies, and develop a safety plan while the patient is in the clinical setting. Please contact our team if there is a concern that risk level has changed.  CSSR Risk Category:C-SSRS RISK  CATEGORY: High Risk  Suicide Risk Assessment: Patient has  following modifiable risk factors for suicide: social isolation and current symptoms: anxiety/panic, insomnia, impulsivity, anhedonia, hopelessness, which we are addressing by recommended inpatient psychiatric admission. Patient has following non-modifiable or demographic risk factors for suicide: history of self harm behavior and psychiatric hospitalization Patient has the following protective factors against suicide: Access to outpatient mental health care  Thank you for this consult request. Recommendations have been communicated to the primary team.  We will continue to follow patient at this time.   Cythia Bachtel MOTLEY-MANGRUM, PMHNP       History of Present Illness  Relevant Aspects of Hospital ED Course:  Admitted on 06/13/2024 for worsening depressive symptoms and suicidal thoughts.  Patient Report:  Rebekah Davis, 38 y.o., female patient seen face to face by this provider, consulted with Dr. Larina; and chart reviewed on 06/14/24.  On evaluation Rebekah Davis endorses suicidal thoughts but denies current plan or intent. Reports self-harming "because I can control that pain." Denies access to weapons/firearms. Denies prior suicide attempts, but acknowledges ongoing self-harm. Denies hallucinations or homicidal ideation. States problems are "situational," linked to job loss and interpersonal stressors. Reports no protective factors or support system. States she has no one to call and no collateral is available. Reports she has upcoming appointment with new therapist (twice weekly, starting Tuesday). States she is linked with a community support team, though engagement remains inconsistent.  During evaluation Rebekah Davis is sitting up in bed and appears to be in no acute distress. She is alert, oriented x 3, calm, cooperative and attentive.  Appearance: Lying in bed, calm, cooperative, appropriate. Mood/Affect: Reports feeling overwhelmed; affect constricted but congruent. Thought Process: Linear but  focused on situational distress. Thought Content: Suicidal thoughts without plan; admits to recent cutting behaviors. No HI or psychotic content noted. Insight/Judgment: Poor; unable to verbalize protective factors or contract for safety. Perception: No hallucinations endorsed.  Patient presents to the ED with worsening depressive symptoms and suicidal thoughts. She states she wants to harm herself by "stabbing her eyes out" and killing herself. Patient reports frequent episodes of self-harm by cutting, with recent episodes requiring sutures. She has had 5-10+ ED visits monthly for self-harming behaviors.  Historically, patient has not been accepted to inpatient facilities due to her borderline personality diagnosis, chronic suicidality, and medical comorbidities, with concern of limited benefit from inpatient care. Despite this, patient continues to decline functionally, with near-daily urges or acts of self-harm, inability to care for herself, and repeated ED presentations.  Psych ROS:  Depression: Endorses  Anxiety:  endorses Mania (lifetime and current): Denies Psychosis: (lifetime and current): Denies  Collateral information:  No collateral available at this time; patient reports no supportive family/friends accessible.  Review of Systems  Psychiatric/Behavioral:  Positive for depression and suicidal ideas.      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 Current Psych Provider: yes Home Meds (current): lexapro  abilify  Previous Med Trials: unknown Therapy: denies   Prior Psych Hospitalization: yes, march 2025  Prior Self Harm: yes Prior Violence: denies  Access to weapons/lethal means: denies    Substance History Alcohol: denies  Illicit drugs: denies  Exam Findings  Physical Exam:  Vital Signs:  Temp:  [98.1 F (36.7  C)-98.4 F (36.9 C)] 98.4 F (36.9 C) (08/31 1534) Pulse Rate:  [82-99] 95 (08/31 1534) Resp:  [17-18] 17 (  08/31 1534) BP: (126-149)/(78-89) 145/79 (08/31 1534) SpO2:  [99 %-100 %] 99 % (08/31 1534) Weight:  [149.2 kg] 149.2 kg (08/30 2311) Blood pressure (!) 145/79, pulse 95, temperature 98.4 F (36.9 C), temperature source Oral, resp. rate 17, height 5' 5 (1.651 m), weight (!) 149.2 kg, SpO2 99%. Body mass index is 54.75 kg/m.  Physical Exam Vitals and nursing note reviewed. Exam conducted with a chaperone present.  Psychiatric:        Attention and Perception: Attention normal.        Mood and Affect: Mood is anxious and depressed. Affect is flat.        Speech: Speech normal.        Behavior: Behavior is cooperative.        Thought Content: Thought content includes suicidal ideation.        Cognition and Memory: Cognition is impaired.        Judgment: Judgment is inappropriate.     Mental Status Exam: General Appearance: Disheveled  Orientation:  Full (Time, Place, and Person)  Memory:  Immediate;   Good Recent;   Good Remote;   Good  Concentration:  Concentration: Good  Recall:  Good  Attention  Good  Eye Contact:  Good  Speech:  Clear and Coherent and Normal Rate  Language:  Good  Volume:  Normal  Mood: frustrated  Affect:  irritable/anxous  Thought Process: Linear but focused on situational distress.  Thought Content: Suicidal thoughts without plan; admits to recent cutting behaviors. No HI or psychotic content noted.  Suicidal Thoughts: Intermittent with no plan   Homicidal Thoughts:  No  Judgement:  Poor  Insight: Poor; unable to verbalize protective factors or contract for safety.  Psychomotor Activity:  slow,  gait unsteady  Akathisia:  not assessed   Fund of Knowledge:  Good    Assets:  Communication Skills Desire for Improvement  Cognition:  WNL  ADL's:  Impaired  AIMS (if indicated):        Other History   These have been pulled in through the  EMR, reviewed, and updated if appropriate.  Family History:  The patient's family history includes Depression in her mother; Diabetes in her father and mother; Heart disease in her paternal grandmother; Hypertension in her father and mother; Kidney disease in her mother; Other in her maternal grandmother; Ovarian cancer in her maternal grandmother; Stroke in her paternal grandfather; Thyroid  disease in her mother.  Medical History: Past Medical History:  Diagnosis Date   Abdominal pain, suprapubic 06/04/2016   Abnormal Pap smear of cervix    Acute cystitis 03/10/2018   Anxiety    Arthritis    C. difficile diarrhea 06/12/2018   Cataract    Chest pain 03/28/2018   Depression    Depression with suicidal ideation 03/09/2018   Diarrhea 06/13/2018   GERD (gastroesophageal reflux disease)    History of kidney stones    Hypomagnesemia 06/13/2018   Kidney stone    Low back pain 07/22/2012   Migraine    Non-suicidal self harm as coping mechanism (HCC) 12/25/2019   Oral thrush 06/12/2018   Ovarian cyst    PCOS (polycystic ovarian syndrome)    PTSD (post-traumatic stress disorder)    Scarlet fever    Sleep apnea    Substance abuse (HCC)    Suicidal ideation 04/12/2018    Surgical History: Past Surgical History:  Procedure Laterality Date   BILATERAL SALPINGOECTOMY     LUMBAR LAMINECTOMY     RIGHT OOPHERECTOMY  Right    TENOLYSIS Left 10/04/2020   Procedure: LEFT ACHILLES TENDON DEBRIDEMENT AND RECONSTRUCTION, CALCANEAL EXOSTECTOMY, GASTROCNEMIUS RECESSION,  FLEXOR HALLUCIS LONGUS TRANSFER;  Surgeon: Elsa Lonni SAUNDERS, MD;  Location: [facility] OR;  Service: Orthopedics;  Laterality: Left;  LENGTH OF SURGERY: 1.5 HOURS   TONSILLECTOMY       Medications:   Current Facility-Administered Medications:    acetaminophen  (TYLENOL ) tablet 650 mg, 650 mg, Oral, Q6H PRN, Logan Ubaldo NOVAK, PA-C, 650 mg at 06/14/24 1539   ARIPiprazole  (ABILIFY ) tablet 10 mg, 10 mg, Oral, Daily, Logan Ubaldo NOVAK, PA-C, 10 mg at 06/14/24 9082   escitalopram  (LEXAPRO ) tablet 10 mg, 10 mg, Oral, q AM, Logan Ubaldo B, PA-C, 10 mg at 06/14/24 9363   hydrOXYzine  (ATARAX ) tablet 25 mg, 25 mg, Oral, TID PRN, Logan Ubaldo B, PA-C, 25 mg at 06/14/24 1538   LORazepam  (ATIVAN ) tablet 1 mg, 1 mg, Oral, QHS PRN, Logan Ubaldo NOVAK, PA-C  Current Outpatient Medications:    acetaminophen  (TYLENOL ) 500 MG tablet, Take 1,000 mg by mouth 3 (three) times daily as needed for mild pain (pain score 1-3) or moderate pain (pain score 4-6)., Disp: , Rfl:    ARIPiprazole  (ABILIFY ) 10 MG tablet, Take 10 mg by mouth daily., Disp: , Rfl:    ergocalciferol  (VITAMIN D2) 1.25 MG (50000 UT) capsule, Take 1 capsule (50,000 Units total) by mouth once a week., Disp: 4 capsule, Rfl: 2   escitalopram  (LEXAPRO ) 10 MG tablet, Take 1 tablet (10 mg total) by mouth at bedtime. (Patient taking differently: Take 10 mg by mouth in the morning.), Disp: 30 tablet, Rfl: 0   hydrOXYzine  (ATARAX ) 25 MG tablet, Take 1 tablet (25 mg total) by mouth 3 (three) times daily as needed for anxiety., Disp: 30 tablet, Rfl: 0   levonorgestrel (MIRENA, 52 MG,) 20 MCG/DAY IUD, 1 each by Intrauterine route once., Disp: , Rfl:    LORazepam  (ATIVAN ) 1 MG tablet, Take 1 mg by mouth at bedtime as needed for sleep., Disp: , Rfl:    methocarbamol  (ROBAXIN ) 500 MG tablet, Take 500 mg by mouth 2 (two) times daily as needed for muscle spasms., Disp: , Rfl:    neomycin -bacitracin -polymyxin (NEOSPORIN) OINT, Apply 1 Application topically as needed for wound care., Disp: , Rfl:    polyethylene glycol powder (GLYCOLAX /MIRALAX ) 17 GM/SCOOP powder, Take 17 g by mouth daily as needed for mild constipation or moderate constipation., Disp: , Rfl:    Scar Treatment Products [facility] ADVANCED SCAR GEL EX), Apply 1 Application topically daily., Disp: , Rfl:    sennosides-docusate sodium  (SENOKOT-S) 8.6-50 MG tablet, Take 1 tablet by mouth as needed for constipation., Disp: , Rfl:    Allergies: Allergies  Allergen Reactions   Adhesive [Tape] Other (See Comments)    blister   Cherry Anaphylaxis and Rash   Silicone Dermatitis and Hives    blisters   Sulfamethoxazole-Trimethoprim Hives   Ibuprofen      Other Reaction(s): edema in hands and feet    Pt reports she is not allergic to ibuprofen    Levofloxacin Other (See Comments)    Muscle Pain   Morphine Other (See Comments)    Feels like unable to breath or swallow    Tizanidine Diarrhea   Gabapentin Rash   Metformin Nausea And Vomiting and Other (See Comments)    Diaphoresis    Sulfa Antibiotics Hives and Rash   Sumatriptan Diarrhea, Nausea And Vomiting and Other (See Comments)    Decreased heart rate and respiratory rate  Sheralyn Pinegar MOTLEY-MANGRUM, PMHNP

## 2024-06-14 NOTE — ED Notes (Signed)
 Patient resting comfortably

## 2024-06-14 NOTE — ED Notes (Signed)
 PT alert and pleasant.   Pt requests tylenol  for a headache and atarax  for anxiety.

## 2024-06-14 NOTE — ED Notes (Signed)
 Patient is dressed out in a grown.  Patients belongings are in a white belongings bag.   Belongings include phone, keys, pen, cards, and a shirt.  Patient is also wearing a boot on the left foot.
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
## 2024-06-14 NOTE — ED Notes (Signed)
 Patient awake at this time. Patient would like something for her anxiety.

## 2024-06-14 NOTE — ED Provider Notes (Signed)
 [place] EMERGENCY DEPARTMENT AT [facility] Provider Note   CSN: [id number] Arrival date & time: 06/13/24  2250     Patient presents with: Suicidal   Rebekah Davis is a 38 y.o. female.  Patient with past medical history significant for borderline personality disorder, deliberate self cutting, PTSD, hypertension, mood disorder, morbid obesity presents to the emergency department complaining of feeling depressed, having the desire to herself by stabbing her eyes out and killing herself.  She denies hallucinations, homicidal ideations.  She denies other medical complaints at this time.   HPI     Prior to Admission medications   Medication Sig Start Date End Date Taking? Authorizing Provider  acetaminophen  (TYLENOL ) 500 MG tablet Take 1,000 mg by mouth 3 (three) times daily as needed for mild pain (pain score 1-3) or moderate pain (pain score 4-6).    [provider]  ARIPiprazole  (ABILIFY ) 10 MG tablet Take 10 mg by mouth daily. 06/09/24   [provider]  ergocalciferol  (VITAMIN D2) 1.25 MG (50000 UT) capsule Take 1 capsule (50,000 Units total) by mouth once a week. Patient taking differently: Take 50,000 Units by mouth once a week. On Fridays 10/23/23     escitalopram  (LEXAPRO ) 10 MG tablet Take 1 tablet (10 mg total) by mouth at bedtime. Patient taking differently: Take 10 mg by mouth in the morning. 04/21/24   Motley-Mangrum, Jadeka A, PMHNP  hydrOXYzine  (ATARAX ) 25 MG tablet Take 1 tablet (25 mg total) by mouth 3 (three) times daily as needed for anxiety. 04/21/24   Motley-Mangrum, Jadeka A, PMHNP  levonorgestrel (MIRENA, 52 MG,) 20 MCG/DAY IUD 1 each by Intrauterine route once.    [provider]  LORazepam  (ATIVAN ) 1 MG tablet Take 1 mg by mouth at bedtime as needed for sleep.    [provider]  methocarbamol  (ROBAXIN ) 500 MG tablet Take 500 mg by mouth 2 (two) times daily as needed for muscle spasms. 03/08/24   [provider]   neomycin -bacitracin -polymyxin (NEOSPORIN) OINT Apply 1 Application topically as needed for wound care.    [provider]  polyethylene glycol powder (GLYCOLAX /MIRALAX ) 17 GM/SCOOP powder Take 17 g by mouth daily as needed for mild constipation or moderate constipation.    [provider]  Scar Treatment Products [facility] ADVANCED SCAR GEL EX) Apply 1 Application topically daily.    [provider]  sennosides-docusate sodium  (SENOKOT-S) 8.6-50 MG tablet Take 1 tablet by mouth as needed for constipation.    [provider]    Allergies: Adhesive [tape], Cherry, Sulfamethoxazole-trimethoprim, Ibuprofen , Levofloxacin, Morphine, Tizanidine, Gabapentin, Metformin, Sulfa antibiotics, and Sumatriptan    Review of Systems  Updated Vital Signs BP (!) 149/89 (BP Location: Right Arm)   Pulse 99   Temp 98.1 F (36.7 C) (Oral)   Resp 18   Ht 5' 5 (1.651 m)   Wt (!) 149.2 kg   SpO2 100%   BMI 54.75 kg/m   Physical Exam Vitals and nursing note reviewed.  Constitutional:      Appearance: She is well-developed.  HENT:     Head: Normocephalic and atraumatic.  Cardiovascular:     Rate and Rhythm: Normal rate and regular rhythm.  Pulmonary:     Effort: Pulmonary effort is normal. No respiratory distress.  Musculoskeletal:     Comments: There is a brace to the left leg.  Skin:    General: Skin is warm.  Neurological:     Mental Status: She is alert and oriented to person, place,  and time.  Psychiatric:     Comments: Flat affect     (all labs ordered are listed, but only abnormal results are displayed) Labs Reviewed  COMPREHENSIVE METABOLIC PANEL WITH GFR - Abnormal; Notable for the following components:      Result Value   Glucose, Bld 119 (*)    All other components within normal limits  CBC - Abnormal; Notable for the following components:   WBC 12.6 (*)    All other components within normal limits  ETHANOL  HCG, SERUM, QUALITATIVE  URINE  DRUG SCREEN    EKG: None  Radiology: No results found.   Procedures   Medications Ordered in the ED  ARIPiprazole  (ABILIFY ) tablet 10 mg (has no administration in time range)  escitalopram  (LEXAPRO ) tablet 10 mg (has no administration in time range)  hydrOXYzine  (ATARAX ) tablet 25 mg (has no administration in time range)  LORazepam  (ATIVAN ) tablet 1 mg (has no administration in time range)                                    Medical Decision Making Amount and/or Complexity of Data Reviewed Labs: ordered.   This patient presents to the ED for concern of suicidal ideation/self-harm, this involves an extensive number of treatment options, and is a complaint that carries with it a high risk of complications and morbidity.    Co morbidities / Chronic conditions that complicate the patient evaluation  Borderline personality disorder, mood disorder, PTSD, major depressive disorder, passive suicidal ideations   Additional history obtained:  Additional history obtained from EMR External records from outside source obtained and reviewed including psychiatry notes showing the patient is hard to place.  Recommending continued outpatient follow-up.  They did not feel that she met criteria for IVC as of 2 days ago.   Lab Tests:  I Ordered, and personally interpreted labs.  The pertinent results include: White count of 12,600 consistent with recent baseline   Consultations Obtained:  I requested consultation with the psychiatry service   Social Determinants of Health:  Patient is a former smoker   Test / Admission - Considered:  Patient voices suicidal ideations and states that she would like to cut her eyes out to kill herself.  She is here voluntarily at this time.  She is medically clear for TTS evaluation.      Final diagnoses:  Suicidal ideation    ED Discharge Orders     None          Logan Ubaldo KATHEE DEVONNA 06/14/24 0040    Bari Charmaine FALCON,  MD 06/14/24 2312

## 2024-06-14 NOTE — Progress Notes (Addendum)
  Inpatient Placement Disposition:  Roxianne Olp, NP recommends inpatient hospitalization. [facility], Linsey Strader, RN reviewed for admission and patient declined. Therefore, patient faxed out to alternative facilities for consideration of bed placement.      Destination  Service Provider  Services Address Phone Fax Patient Preferred  [facility]   -- [place] [address] [phone] --  [facility]   -- [address] [place] [address] [phone] --  [address]   -- [address] [place] [address] [phone] --  [facility]   -- [address], [place] [address] [phone] --  [facility]   -- [address] [place], [place] [address] [phone] --  [facility]   -- [address] [place], [place] [address] [phone] --  [facility]   -- [address] [place] [address] [phone] --  [facility]   -- [address], [place] [address] [phone] --  [facility]   -- [address] [place] [address] [phone] --  [facility]   -- [address] [place] [address] [place] [address] [phone] --  [facility]   -- [address] [place] [address] [phone] --  [facility]   -- [address] [place] [address] [phone] --  [facility]   -- [place] [phone] --  [facility]   -- [address], [place] [address] [phone] --  [facility]   -- [address] [place] [address] [phone] --  [facility]   -- [address] [place], [place]  [place] [phone] --  [facility]   -- [address] [place] [address] [phone] --  [facility]   -- [address], [place] [address] [phone] --  [facility]   -- [address] [place] [address] [phone] --  [facility]   -- [address] [place] [address] [phone] --  [facility]   -- [address], [place] [address] [phone] --  [facility]   -- [address], [place] [address] [phone] --  [facility]   -- [address] [place] [address] [phone] --  [facility] Patient Placement   -- [facility], [place] [phone] --  [facility]   -- [address] [place] [address] [phone] --  [facility]   -- [address], [place] [address] [phone] --  [facility]   -- [address] [place] [address] [phone] --  [facility]   -- [address] [place] [address] [phone] --  [facility]   -- [address] [place] [address] [phone] [facility]   -- [place] [phone] -- --

## 2024-06-14 NOTE — ED Notes (Signed)
 PT sleeping, equal rise and fall of chest

## 2024-06-15 NOTE — ED Provider Notes (Signed)
 Emergency Medicine Observation Re-evaluation Note  Emmerie Battaglia is a 38 y.o. female, seen on rounds today.  Pt initially presented to the ED for complaints of Suicidal Currently, the patient is resting.  Physical Exam  BP (!) 107/54 (BP Location: Left Arm)   Pulse 75   Temp 98.3 F (36.8 C) (Oral)   Resp 18   Ht 5' 5 (1.651 m)   Wt (!) 149.2 kg   SpO2 100%   BMI 54.75 kg/m  Physical Exam General: Calm Cardiac: Well perfused Lungs: Even respirations Psych: Calm  ED Course / MDM  EKG:   I have reviewed the labs performed to date as well as medications administered while in observation.  Recent changes in the last 24 hours include psychiatry evaluation and recommendation for inpatient psych.  Plan  Current plan is to discharge today. Patient with outpatient psychiatry follow up tomorrow. Psych has seen and cleared. Stable for discharge.     Darra Fonda MATSU, MD 06/15/24 1252

## 2024-06-15 NOTE — ED Notes (Signed)
 [facility] called pts CST at [facility] to arrange an after discharge follow up visit. [facility] was told that due to the holiday pt would have to call tomorrow to arrange a follow up visit.   Chesley Holt, [facility]  06/15/24

## 2024-06-15 NOTE — Discharge Instructions (Signed)
  Discharge recommendations:  Patient is to take medications as prescribed. Please see information for follow-up appointment with psychiatry and therapy. Please follow up with your primary care provider for all medical related needs.   Therapy: We recommend that patient participate in individual therapy to address mental health concerns.  Medications: The patient or guardian is to contact a medical professional and/or outpatient provider to address any new side effects that develop. The patient or guardian should update outpatient providers of any new medications and/or medication changes.   Atypical antipsychotics: If you are prescribed an atypical antipsychotic, it is recommended that your height, weight, BMI, blood pressure, fasting lipid panel, and fasting blood sugar be monitored by your outpatient providers.  Safety:  The patient should abstain from use of illicit substances/drugs and abuse of any medications. If symptoms worsen or do not continue to improve or if the patient becomes actively suicidal or homicidal then it is recommended that the patient return to the closest hospital emergency department, the [facility], or call 911 for further evaluation and treatment. National Suicide Prevention Lifeline 1-800-SUICIDE or 325-722-1271.  About 988 988 offers 24/7 access to trained crisis counselors who can help people experiencing mental health-related distress. People can call or text 988 or chat 988lifeline.org for themselves or if they are worried about a loved one who may need crisis support.  Crisis Mobile: [facility]:                     (304) 148-9363 (for crisis response 24 hours a day) [facility] Hotline:                                            (727)784-4806    Safety Plan Rebekah Davis will reach out to her community support team Vic), call 911 or call mobile crisis, or go to nearest emergency room if condition worsens  or if suicidal thoughts become active Patients' will follow up with [facility] for outpatient psychiatric services (therapy/medication management).  The suicide prevention education provided includes the following: Suicide risk factors Suicide prevention and interventions National Suicide Hotline telephone number [facility] assessment telephone number [facility] Emergency Assistance 911 [facility] and/or Residential Mobile Crisis Unit telephone number Request made of family/significant other to:  community support team (Rebekah Davis) Remove weapons (e.g., guns, rifles, knives), all items previously/currently identified as safety concern.   Remove drugs/medications (over the counter, prescriptions, illicit drugs), all items previously/currently identified as a safety concern.

## 2024-06-16 NOTE — Consult Note (Signed)
 [facility] Consult Follow-up  Patient Name: .Rebekah Davis  MRN: [id number]  DOB: Jan 25, 1986  Consult Order details:    Mode of Visit: In person    Psychiatry Consult Evaluation  Service Date: June 16, 2024 LOS:  LOS: 0 days  Chief Complaint worsening depressive symptoms and suicidal thoughts.   Primary Psychiatric Diagnoses  Major Depressive Disorder   PTSD Borderline personality disorder   Assessment  Pyper Desantiago is a 38 y.o. female admitted: Presented to the EDfor 06/13/2024 10:53 PM for worsening depressive symptoms and suicidal thoughts. She carries the psychiatric diagnoses of borderline personality disorder, MDD, PTSD, suicidal ideation, psychosis and anxiety and has a past medical history of HTN, COPD, GERD, polycystic ovary syndrome, achilles tendinitis, morbid obesity, endometriosis and vitamin D  deficiency.   Patient presented to the ED last night requesting a psychiatric evaluation, citing emotional distress and feeling overwhelmed. Psychiatric consult was requested at that time. Upon reassessment this morning, patient reports feeling significantly better, denies any suicidal or homicidal ideation, and is requesting to leave the ED. Patient states that she feels she has enough appropriate resources to help her, since she knows she has barriers that will make it hard for her to get placed in an inpatient psychiatric facility. She meets criteria for outpatient psychiatric hospitalization based on not being a danger to herself or others and not being acutely psychotic.  Current outpatient psychotropic medications include Abilify , Atarax , and Ativan  and historically she has had a beneficial response to these medications. She was compliant with medications prior to admission as evidenced by patient report. On initial examination, patient is cooperative and pleasant. Please see plan below for detailed recommendations.   Diagnoses:  Active Hospital problems: Principal  Problem:   MDD (major depressive disorder), recurrent severe, without psychosis (HCC) Active Problems:   PTSD (post-traumatic stress disorder)    Plan   ## Psychiatric Medication Recommendations:  Outpatient Follow-Up: Continue with current ACT team support. APS weekly check-ins for ongoing safety monitoring. Follow up with outpatient psychiatrist for medication management.  Medication Management: Encourage scheduled use of hydroxyzine  as previously found helpful for anxiety and emotional regulation. No changes to current regimen at this time.  Additional Resources: Patient referred to virtual Intensive Outpatient Program (IOP), running 9 AM - 1 PM, to provide additional structured support for depression and self-harm management.    ## Medical Decision Making Capacity: Not specifically addressed in this encounter   ## Further Work-up:  -- most recent EKG on 06/11/2024 had QtC of 425 -- Pertinent labwork reviewed earlier this admission includes: CBC, CMP, UA, pregnancy, alcohol and UDS     ## Disposition:-- There are no psychiatric contraindications to discharge at this time Discharged home in stable condition with outpatient follow-up recommended.    ## Behavioral / Environmental: -To minimize splitting of staff, assign one staff person to communicate all information from the team when feasible. or Utilize compassion and acknowledge the patient's experiences while setting clear and realistic expectations for care.                ## Safety and Observation Level:  - Based on my clinical evaluation, I estimate the patient to be at low risk of self harm in the current setting. - At this time, we recommend  routine. This decision is based on my review of the chart including patient's history and current presentation, interview of the patient, mental status examination, and consideration of suicide risk including evaluating suicidal ideation, plan, intent, suicidal or self-harm  behaviors, risk factors, and protective factors. This judgment is based on our ability to directly address suicide risk, implement suicide prevention strategies, and develop a safety plan while the patient is in the clinical setting. Please contact our team if there is a concern that risk level has changed.   CSSR Risk Category:C-SSRS RISK CATEGORY: High Risk   Suicide Risk Assessment: Patient has following modifiable risk factors for suicide: triggering events, which we are addressing by recommending outpatient psychiatric follow up. Patient has following non-modifiable or demographic risk factors for suicide: history of self harm behavior and psychiatric hospitalization Patient has the following protective factors against suicide: Access to outpatient mental health care, Supportive family, and Supportive friends   Thank you for this consult request. Recommendations have been communicated to the primary team.  We will sign off at this time.   Jatoya Armbrister MOTLEY-MANGRUM, PMHNP       History of Present Illness  Relevant Aspects of Hospital ED Course:  Admitted on 06/13/2024 for worsening depressive symptoms and suicidal thoughts.   Patient Report:  Patient is a 39 year old female with a known history of borderline personality disorder and chronic self-harming behaviors who presented to the ED for evaluation and stabilization. She has been in the emergency department for 78 hours and is identified as a high ED utilizer.  On re-evaluation today, the patient is sitting calmly on the side of her bed. She denies current suicidal ideation (SI), homicidal ideation (HI), auditory hallucinations (AH), visual hallucinations (VH), or paranoia. She verbalizes that she does not wish to harm herself at this time and is able to contract for safety.  Patient reports feeling more hopeful and forward-thinking today, expressing a willingness to engage with treatment resources. She is looking forward to utilizing  outpatient and virtual resources offered during her ED stay.  The provider discussed with the patient: The dangers of self-harming behaviors and associated medical and psychological risks. The importance of developing new coping strategies to replace maladaptive self-harm behaviors when she experiences frustration or emotional distress. Utilization of available outpatient supports and resources to prevent recurrent ED visits. Patient was pleasant and cooperative throughout the evaluation. There has been no agitation noted over the past 48 hours, and she has been medication compliant during her ED stay.  Risk Assessment:  Suicide Risk: Low Patient consistently presents immediately to the ED whenever she has self-harming or suicidal thoughts, not allowing herself time to act on these thoughts. Despite persistent suicidal ideation and self-harm behaviors throughout her life, she has never attempted suicide. At discharge, her affect is improved, she is smiling, forward-thinking, and expresses hopefulness.  No evidence of imminent risk of harm to self or others at this time.  Patient does not meet criteria for inpatient psychiatric admission or IVC.  Psych ROS:  Depression: Denies, says its intermittent upon mood Anxiety:  Denies  Mania (lifetime and current):  Denies Psychosis: (lifetime and current):  Denies  Collateral information:  ACT Team: Patient is established with ACTT, who confirmed they will follow up after discharge for continued support and care coordination.  APS (Adult Protective Services): Will follow up weekly with the patient to ensure safety and adherence to outpatient resources.  Recent Discharge: [facility] after a 72-hour stay, where she successfully contracted for safety and demonstrated stability.  Review of Systems  Psychiatric/Behavioral: Negative.       Psychiatric and Social History  Psychiatric  History:  Information collected from patient and chart  review   Prev Dx/Sx: borderline personality disorder, MDD, PTSD, suicidal ideation, psychosis and anxiety Current Psych Provider: yes Home Meds (current): lexapro , abilify , ativan  and hydroxyzine  Previous Med Trials: unknown Therapy: not currently   Prior Psych Hospitalization: Yes  Prior Self Harm: yes, cutting  Prior Violence: denies   Family Psych History: Mom - borderline personality disorder Family Hx suicide: denies   Social History:  Developmental Hx: WNL Educational Hx: some college Occupational Hx: disabled Legal Hx: none noted Living Situation: lives in transitional housing Spiritual Hx: none noted Access to weapons/lethal means: denies    Substance History Alcohol: denies   Tobacco: denies Illicit drugs: denies Prescription drug abuse: denies Rehab hx: denies  Exam Findings  Physical Exam:  Vital Signs:    Blood pressure (!) 107/54, pulse 75, temperature 98.3 F (36.8 C), temperature source Oral, resp. rate 18, height 5' 5 (1.651 m), weight (!) 149.2 kg, SpO2 100%. Body mass index is 54.75 kg/m.  Physical Exam Vitals and nursing note reviewed. Exam conducted with a chaperone present.  Neurological:     Mental Status: She is alert.  Psychiatric:        Mood and Affect: Mood normal.        Behavior: Behavior normal.        Thought Content: Thought content normal.     Mental Status Exam: General Appearance: Casual  Orientation:  Full (Time, Place, and Person)  Memory:  Immediate;   Good Recent;   Good Remote;   Good  Concentration:  Concentration: Good  Recall:  Good  Attention  Good  Eye Contact:  Good  Speech:  Clear and Coherent  Language:  Good  Volume:  Normal  Mood: Euthymic  Affect:  Congruent  Thought Process:  Goal Directed  Thought Content:  Logical  Suicidal Thoughts:  No  Homicidal Thoughts:  No  Judgement: Fair  Insight:  Fair  Psychomotor Activity:  Normal  Akathisia:   No  Fund of Knowledge:  Fair    Assets:  Architect Housing Leisure Time  Cognition:  WNL  ADL's:  Intact  AIMS (if indicated):        Other History   These have been pulled in through the EMR, reviewed, and updated if appropriate.  Family History:  The patient's family history includes Depression in her mother; Diabetes in her father and mother; Heart disease in her paternal grandmother; Hypertension in her father and mother; Kidney disease in her mother; Other in her maternal grandmother; Ovarian cancer in her maternal grandmother; Stroke in her paternal grandfather; Thyroid  disease in her mother.  Medical History: Past Medical History:  Diagnosis Date   Abdominal pain, suprapubic 06/04/2016   Abnormal Pap smear of cervix    Acute cystitis 03/10/2018   Anxiety    Arthritis    C. difficile diarrhea 06/12/2018   Cataract    Chest pain 03/28/2018   Depression    Depression with suicidal ideation 03/09/2018   Diarrhea 06/13/2018   GERD (gastroesophageal reflux disease)    History of kidney stones    Hypomagnesemia 06/13/2018   Kidney stone    Low back pain 07/22/2012   Migraine    Non-suicidal self harm as coping mechanism (HCC) 12/25/2019   Oral thrush 06/12/2018   Ovarian cyst    PCOS (polycystic ovarian syndrome)    PTSD (post-traumatic stress disorder)    Scarlet fever    Sleep apnea    Substance abuse (HCC)    Suicidal ideation  04/12/2018    Surgical History: Past Surgical History:  Procedure Laterality Date   BILATERAL SALPINGOECTOMY     LUMBAR LAMINECTOMY     RIGHT OOPHERECTOMY Right    TENOLYSIS Left 10/04/2020   Procedure: LEFT ACHILLES TENDON DEBRIDEMENT AND RECONSTRUCTION, CALCANEAL EXOSTECTOMY, GASTROCNEMIUS RECESSION,  FLEXOR HALLUCIS LONGUS TRANSFER;  Surgeon: Elsa Lonni SAUNDERS, MD;  Location: [facility] OR;  Service: Orthopedics;  Laterality: Left;  LENGTH OF SURGERY: 1.5 HOURS   TONSILLECTOMY        Medications:  No current facility-administered medications for this encounter.  Current Outpatient Medications:    acetaminophen  (TYLENOL ) 500 MG tablet, Take 1,000 mg by mouth 3 (three) times daily as needed for mild pain (pain score 1-3) or moderate pain (pain score 4-6)., Disp: , Rfl:    ARIPiprazole  (ABILIFY ) 10 MG tablet, Take 10 mg by mouth daily., Disp: , Rfl:    ergocalciferol  (VITAMIN D2) 1.25 MG (50000 UT) capsule, Take 1 capsule (50,000 Units total) by mouth once a week., Disp: 4 capsule, Rfl: 2   escitalopram  (LEXAPRO ) 10 MG tablet, Take 1 tablet (10 mg total) by mouth at bedtime. (Patient taking differently: Take 10 mg by mouth in the morning.), Disp: 30 tablet, Rfl: 0   hydrOXYzine  (ATARAX ) 25 MG tablet, Take 1 tablet (25 mg total) by mouth 3 (three) times daily as needed for anxiety., Disp: 30 tablet, Rfl: 0   levonorgestrel (MIRENA, 52 MG,) 20 MCG/DAY IUD, 1 each by Intrauterine route once., Disp: , Rfl:    LORazepam  (ATIVAN ) 1 MG tablet, Take 1 mg by mouth at bedtime as needed for sleep., Disp: , Rfl:    methocarbamol  (ROBAXIN ) 500 MG tablet, Take 500 mg by mouth 2 (two) times daily as needed for muscle spasms., Disp: , Rfl:    neomycin -bacitracin -polymyxin (NEOSPORIN) OINT, Apply 1 Application topically as needed for wound care., Disp: , Rfl:    polyethylene glycol powder (GLYCOLAX /MIRALAX ) 17 GM/SCOOP powder, Take 17 g by mouth daily as needed for mild constipation or moderate constipation., Disp: , Rfl:    Scar Treatment Products [facility] ADVANCED SCAR GEL EX), Apply 1 Application topically daily., Disp: , Rfl:    sennosides-docusate sodium  (SENOKOT-S) 8.6-50 MG tablet, Take 1 tablet by mouth as needed for constipation., Disp: , Rfl:   Allergies: Allergies  Allergen Reactions   Adhesive [Tape] Other (See Comments)    blister   Cherry Anaphylaxis and Rash   Silicone Dermatitis and Hives    blisters   Sulfamethoxazole-Trimethoprim Hives   Ibuprofen      Other  Reaction(s): edema in hands and feet    Pt reports she is not allergic to ibuprofen    Levofloxacin Other (See Comments)    Muscle Pain   Morphine Other (See Comments)    Feels like unable to breath or swallow    Tizanidine Diarrhea   Gabapentin Rash   Metformin Nausea And Vomiting and Other (See Comments)    Diaphoresis    Sulfa Antibiotics Hives and Rash   Sumatriptan Diarrhea, Nausea And Vomiting and Other (See Comments)    Decreased heart rate and respiratory rate      Nyra Anspaugh MOTLEY-MANGRUM, PMHNP

## 2024-06-21 ENCOUNTER — Ambulatory Visit (HOSPITAL_COMMUNITY)
Admission: EM | Admit: 2024-06-21 | Discharge: 2024-06-22 | Disposition: A | Discharge status: Home / Self Care | Attending: Nurse Practitioner | Admitting: Nurse Practitioner

## 2024-06-21 DIAGNOSIS — F331 Major depressive disorder, recurrent, moderate: Secondary | ICD-10-CM | POA: Insufficient documentation

## 2024-06-21 DIAGNOSIS — R630 Anorexia: Secondary | ICD-10-CM | POA: Insufficient documentation

## 2024-06-21 DIAGNOSIS — Z79899 Other long term (current) drug therapy: Secondary | ICD-10-CM | POA: Insufficient documentation

## 2024-06-21 DIAGNOSIS — S83519D Sprain of anterior cruciate ligament of unspecified knee, subsequent encounter: Secondary | ICD-10-CM | POA: Insufficient documentation

## 2024-06-21 DIAGNOSIS — R45851 Suicidal ideations: Secondary | ICD-10-CM | POA: Insufficient documentation

## 2024-06-21 DIAGNOSIS — F4310 Post-traumatic stress disorder, unspecified: Secondary | ICD-10-CM | POA: Insufficient documentation

## 2024-06-21 DIAGNOSIS — Z604 Social exclusion and rejection: Secondary | ICD-10-CM | POA: Insufficient documentation

## 2024-06-21 DIAGNOSIS — F603 Borderline personality disorder: Secondary | ICD-10-CM | POA: Insufficient documentation

## 2024-06-21 DIAGNOSIS — Z793 Long term (current) use of hormonal contraceptives: Secondary | ICD-10-CM | POA: Insufficient documentation

## 2024-06-21 DIAGNOSIS — Z560 Unemployment, unspecified: Secondary | ICD-10-CM | POA: Insufficient documentation

## 2024-06-21 DIAGNOSIS — W19XXXD Unspecified fall, subsequent encounter: Secondary | ICD-10-CM | POA: Insufficient documentation

## 2024-06-21 DIAGNOSIS — Z818 Family history of other mental and behavioral disorders: Secondary | ICD-10-CM | POA: Insufficient documentation

## 2024-06-21 DIAGNOSIS — I498 Other specified cardiac arrhythmias: Secondary | ICD-10-CM | POA: Insufficient documentation
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
## 2024-06-22 ENCOUNTER — Other Ambulatory Visit: Payer: Self-pay

## 2024-06-22 DIAGNOSIS — Z79899 Other long term (current) drug therapy: Secondary | ICD-10-CM | POA: Diagnosis not present

## 2024-06-22 DIAGNOSIS — F4310 Post-traumatic stress disorder, unspecified: Secondary | ICD-10-CM | POA: Diagnosis not present

## 2024-06-22 DIAGNOSIS — F331 Major depressive disorder, recurrent, moderate: Secondary | ICD-10-CM | POA: Diagnosis present

## 2024-06-22 DIAGNOSIS — Z604 Social exclusion and rejection: Secondary | ICD-10-CM | POA: Diagnosis not present

## 2024-06-22 DIAGNOSIS — R45851 Suicidal ideations: Secondary | ICD-10-CM | POA: Diagnosis not present

## 2024-06-22 DIAGNOSIS — W19XXXD Unspecified fall, subsequent encounter: Secondary | ICD-10-CM | POA: Diagnosis not present

## 2024-06-22 DIAGNOSIS — Z560 Unemployment, unspecified: Secondary | ICD-10-CM | POA: Diagnosis not present

## 2024-06-22 DIAGNOSIS — F603 Borderline personality disorder: Secondary | ICD-10-CM | POA: Diagnosis not present

## 2024-06-22 DIAGNOSIS — S83519D Sprain of anterior cruciate ligament of unspecified knee, subsequent encounter: Secondary | ICD-10-CM | POA: Diagnosis not present

## 2024-06-22 DIAGNOSIS — Z818 Family history of other mental and behavioral disorders: Secondary | ICD-10-CM | POA: Diagnosis not present

## 2024-06-22 DIAGNOSIS — I498 Other specified cardiac arrhythmias: Secondary | ICD-10-CM | POA: Diagnosis not present

## 2024-06-22 DIAGNOSIS — R630 Anorexia: Secondary | ICD-10-CM | POA: Diagnosis not present

## 2024-06-22 DIAGNOSIS — Z793 Long term (current) use of hormonal contraceptives: Secondary | ICD-10-CM | POA: Diagnosis not present

## 2024-06-22 LAB — LIPID PANEL
Cholesterol: 192 mg/dL (ref 0–200)
HDL: 58 mg/dL (ref >40)
LDL Cholesterol: 123 mg/dL — ABNORMAL HIGH (ref 0–99)
Total CHOL/HDL Ratio: 3.3 ratio
Triglycerides: 54 mg/dL (ref <150)
VLDL: 11 mg/dL (ref 0–40)

## 2024-06-22 LAB — COMPREHENSIVE METABOLIC PANEL WITH GFR
ALT: 25 U/L (ref 0–44)
AST: 18 U/L (ref 15–41)
Albumin: 3.5 g/dL (ref 3.5–5.0)
Alkaline Phosphatase: 92 U/L (ref 38–126)
Anion gap: 11 (ref 5–15)
BUN: 19 mg/dL (ref 6–20)
CO2: 26 mmol/L (ref 22–32)
Calcium: 9 mg/dL (ref 8.9–10.3)
Chloride: 102 mmol/L (ref 98–111)
Creatinine, Ser: 0.72 mg/dL (ref 0.44–1.00)
GFR, Estimated: >60 mL/min (ref >60)
Glucose, Bld: 92 mg/dL (ref 70–99)
Potassium: 3.8 mmol/L (ref 3.5–5.1)
Sodium: 139 mmol/L (ref 135–145)
Total Bilirubin: 0.8 mg/dL (ref 0.0–1.2)
Total Protein: 6.3 g/dL — ABNORMAL LOW (ref 6.5–8.1)

## 2024-06-22 LAB — CBC WITH DIFFERENTIAL/PLATELET
Abs Immature Granulocytes: 0.03 K/uL (ref 0.00–0.07)
Basophils Absolute: 0.1 K/uL (ref 0.0–0.1)
Basophils Relative: 1 %
Eosinophils Absolute: 0.1 K/uL (ref 0.0–0.5)
Eosinophils Relative: 1 %
HCT: 41.3 % (ref 36.0–46.0)
Hemoglobin: 13.9 g/dL (ref 12.0–15.0)
Immature Granulocytes: 0 %
Lymphocytes Relative: 31 %
Lymphs Abs: 3.3 K/uL (ref 0.7–4.0)
MCH: 31 pg (ref 26.0–34.0)
MCHC: 33.7 g/dL (ref 30.0–36.0)
MCV: 92 fL (ref 80.0–100.0)
Monocytes Absolute: 0.6 K/uL (ref 0.1–1.0)
Monocytes Relative: 5 %
Neutro Abs: 6.7 K/uL (ref 1.7–7.7)
Neutrophils Relative %: 62 %
Platelets: 308 K/uL (ref 150–400)
RBC: 4.49 MIL/uL (ref 3.87–5.11)
RDW: 12.6 % (ref 11.5–15.5)
WBC: 10.7 K/uL — ABNORMAL HIGH (ref 4.0–10.5)
nRBC: 0 % (ref 0.0–0.2)

## 2024-06-22 LAB — ETHANOL: Alcohol, Ethyl (B): <15 mg/dL (ref <15)

## 2024-06-22 MED ORDER — ALUM & MAG HYDROXIDE-SIMETH 200-200-20 MG/5ML PO SUSP: 30.0000 mL | ORAL | Status: DC | PRN

## 2024-06-22 MED ORDER — LORAZEPAM 2 MG/ML IJ SOLN: 2.0000 mg | Freq: Three times a day (TID) | INTRAMUSCULAR | Status: DC | PRN

## 2024-06-22 MED ORDER — ESCITALOPRAM OXALATE 10 MG PO TABS
10.0000 mg | ORAL_TABLET | Freq: Every morning | ORAL | Status: DC
  Administered 2024-06-22: 10 mg via ORAL
  Filled 2024-06-22: qty 1

## 2024-06-22 MED ORDER — HALOPERIDOL 5 MG PO TABS: 5.0000 mg | ORAL_TABLET | Freq: Three times a day (TID) | ORAL | Status: DC | PRN

## 2024-06-22 MED ORDER — ACETAMINOPHEN 325 MG PO TABS
650.0000 mg | ORAL_TABLET | Freq: Four times a day (QID) | ORAL | Status: DC | PRN
  Administered 2024-06-22: 650 mg via ORAL
  Filled 2024-06-22: qty 2

## 2024-06-22 MED ORDER — DIPHENHYDRAMINE HCL 50 MG PO CAPS: 50.0000 mg | ORAL_CAPSULE | Freq: Three times a day (TID) | ORAL | Status: DC | PRN

## 2024-06-22 MED ORDER — HALOPERIDOL LACTATE 5 MG/ML IJ SOLN: 10.0000 mg | Freq: Three times a day (TID) | INTRAMUSCULAR | Status: DC | PRN

## 2024-06-22 MED ORDER — MAGNESIUM HYDROXIDE 400 MG/5ML PO SUSP: 30.0000 mL | Freq: Every day | ORAL | Status: DC | PRN

## 2024-06-22 MED ORDER — HALOPERIDOL LACTATE 5 MG/ML IJ SOLN: 5.0000 mg | Freq: Three times a day (TID) | INTRAMUSCULAR | Status: DC | PRN

## 2024-06-22 MED ORDER — HYDROXYZINE HCL 25 MG PO TABS
25.0000 mg | ORAL_TABLET | Freq: Three times a day (TID) | ORAL | Status: DC | PRN
  Administered 2024-06-22: 25 mg via ORAL
  Filled 2024-06-22: qty 1

## 2024-06-22 MED ORDER — ARIPIPRAZOLE 5 MG PO TABS
5.0000 mg | ORAL_TABLET | Freq: Every day | ORAL | Status: DC
  Administered 2024-06-22: 5 mg via ORAL
  Filled 2024-06-22: qty 1

## 2024-06-22 MED ORDER — DIPHENHYDRAMINE HCL 50 MG/ML IJ SOLN: 50.0000 mg | Freq: Three times a day (TID) | INTRAMUSCULAR | Status: DC | PRN

## 2024-06-22 MED ORDER — TRAZODONE HCL 50 MG PO TABS
50.0000 mg | ORAL_TABLET | Freq: Every evening | ORAL | Status: DC | PRN
  Filled 2024-06-22: qty 1

## 2024-06-22 NOTE — Discharge Summary (Signed)
 [name] to be discharged Home per MD order. Discussed with the patient and all questions fully answered. An After Visit Summary was printed and given to the patient. Patient escorted out, and discharged home via private auto.  Dorla Jung  06/22/2024 11:14 AM

## 2024-06-22 NOTE — ED Notes (Signed)
 Pt with co pain left ankle 7/10. She requested tylenol  for pain. She states that can remove boot at night and elevate foot. Will continue to monitor for safety

## 2024-06-22 NOTE — Discharge Instructions (Signed)
 Closing notes from your doctor: It was a pleasure taking care of you while you were with us . I want to stress once more that medications are part of, but not ALL of what we must do to take care of ourselves mentally.   We all need: - Physical activity - at least 3, but preferably 5 days per week where we exercise for 30 minutes at a level where we get out of breath. That will vary by person and health conditions, but it will improve your mental health as well as your physical health. - Good restful sleep - avoid using a phone or screens in bed, sleep in a dark room, use a white noise machine or app and if you are having trouble sleeping, consult your doctor. - To reduce use of substances - alcohol and tobacco are shown to be harmful to many parts of our health, if you need help, ask for it. The same is true for illegal drugs, if you need help, ask for it. - To find purpose - whether it is caring for loved ones, volunteering, pursuing hobbies, or doing work that means something to you, try and live in accordance with your values. - Healthy relationships - seek healthy relationships where the other person helps you grow and challenges you to be your best. If it does not feel good, seek assistance.  JINNY Morene GORMAN Delsie, MD [facility] Resident ____

## 2024-06-22 NOTE — ED Notes (Signed)
 Pt observed/assessed in recliner sleeping. RR even and unlabored, appearing in no noted distress. Environmental check complete, will continue to monitor for safety

## 2024-06-22 NOTE — Progress Notes (Signed)
Pt is awake, alert and oriented X4. Pt did not voice any complaints of pain or discomfort. No signs of acute distress noted. Administered scheduled meds per order. Pt denies current SI/HI/AVH, plan or intent. Staff will monitor for pt's safety.
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
## 2024-06-22 NOTE — ED Notes (Signed)
 Pt A&O x 4, presents with multiple cuts to bilateral arms & rt lower leg. Self-inflicted.Pt states, I feel like I'm broken and unfixable.  Comfort measures given.  Monitoring for safety.  Denies HI and AV hallucinations.
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
## 2024-06-22 NOTE — ED Provider Notes (Signed)
 [facility] Continuous Assessment Admission H&P  Date: 06/22/24 Patient Name: Rebekah Davis MRN: [id number] Chief Complaint: depressed  Diagnoses:  Final diagnoses:  Borderline personality disorder (HCC)  PTSD (post-traumatic stress disorder)    HPI: Rebekah Davis is a 38 y/o female presenting to [facility] with a psychiatric history of borderline personality disorder, PTSD panic disorder presenting voluntarily and unaccompanied with complaints of depression and passive suicidal ideations.  Patient has been engaging in self injurious behaviors by using a razor to on bilateral arms.  Rebekah Davis, 38 y.o., female patient seen face to face by this provider, consulted with and chart reviewed on 06/22/24.  On evaluation Rebekah Davis reports that she has been feeling down, depressed and hopeless.  Patient reports that she does not have any support and that she is estranged from her family.  Patient states she has had a lot of stressors this year that started with her losing her job in February where she worked at [employer] that also provided her housing.  Patient states when she lost her job she also lost her housing. Patient reports that she has had 7 psychiatric hospitalizations this year.  Patient reports that her most recent 1 was in June at [facility] and it was a horrible experience.  Patient reports that she receives medication management from [facility] who is prescribed patient Abilify  5 mg, Lexapro  10 mg and Ativan  0.5 mg. Patient was supposed to start last week with a new therapist, but her therapist had to cancel due to an illness. Patient reports poor sleep and poor appetite, anhedonia, and social isolation. Patient reports that she recently fell and tore her ACL in her left leg and will need surgery and she is scared and nervous about surgery.   During evaluation Rebekah Davis is sitting in the assessment room in no acute distress.  She is alert, oriented x 4, calm,  cooperative and attentive.  Her mood is sad and depressed with congruent affect.  Rebekah Davis has normal speech, and behavior.  Objectively there is no evidence of psychosis/mania or delusional thinking.  Patient is able to converse coherently, goal directed thoughts, no distractibility, or pre-occupation.  She endorses suicidal ideation and has engaged in self-harm. Patient denies any homicidal ideation, psychosis, and paranoia.  Patient answered question appropriately.    Patient is not able to contract for safety and will be admitted to [facility] continuous observation for crisis management, safety and stabilization. Patient stated that she was not sure if she wanted inpatient treatment and wanted to think about it overnight and discuss with the provider in the morning.   Total Time spent with patient: 20 minutes  Musculoskeletal  Strength & Muscle Tone: within normal limits Gait & Station: normal Patient leans: Patient has a boot on his left leg due to a torn ACL  Psychiatric Specialty Exam  Presentation General Appearance:  Appropriate for Environment  Eye Contact: Good  Speech: Clear and Coherent  Speech Volume: Normal  Handedness: Right   Mood and Affect  Mood: Depressed  Affect: Appropriate   Thought Process  Thought Processes: Coherent  Descriptions of Associations:Intact  Orientation:Full (Time, Place and Person)  Thought Content:Logical  Diagnosis of Schizophrenia or Schizoaffective disorder in past: No  Duration of Psychotic Symptoms: Greater than six months  Hallucinations:Hallucinations: None  Ideas of Reference:None  Suicidal Thoughts:Suicidal Thoughts: Yes, Passive SI Active Intent and/or Plan: Without Intent; Without Plan SI Passive Intent and/or Plan: Without Intent; Without Plan  Homicidal Thoughts:Homicidal  Thoughts: No   Sensorium  Memory: Immediate Good; Remote Good; Recent Good  Judgment: Fair  Insight: Fair   Art therapist   Concentration: Good  Attention Span: Good  Recall: Good  Fund of Knowledge: Good  Language: Good   Psychomotor Activity  Psychomotor Activity: Psychomotor Activity: Normal   Assets  Assets: Communication Skills; Housing; Resilience; Physical Health   Sleep  Sleep: Sleep: Fair Number of Hours of Sleep: 5   Nutritional Assessment (For OBS and FBC admissions only) Has the patient had a weight loss or gain of 10 pounds or more in the last 3 months?: No Has the patient had a decrease in food intake/or appetite?: No Does the patient have dental problems?: No Does the patient have eating habits or behaviors that may be indicators of an eating disorder including binging or inducing vomiting?: No Has the patient recently lost weight without trying?: 0 Has the patient been eating poorly because of a decreased appetite?: 0 Malnutrition Screening Tool Score: 0    Physical Exam HENT:     Head: Normocephalic.     Nose: Nose normal.  Eyes:     Pupils: Pupils are equal, round, and reactive to light.  Cardiovascular:     Rate and Rhythm: Normal rate.  Pulmonary:     Effort: Pulmonary effort is normal.  Abdominal:     General: Abdomen is flat.  Musculoskeletal:        General: Normal range of motion.     Cervical back: Normal range of motion.  Skin:    General: Skin is warm.  Neurological:     Mental Status: She is alert and oriented to person, place, and time.  Psychiatric:        Attention and Perception: Attention normal.        Mood and Affect: Mood is depressed.        Speech: Speech normal.        Behavior: Behavior is cooperative.        Thought Content: Thought content is not paranoid or delusional. Thought content includes suicidal ideation. Thought content does not include homicidal ideation. Thought content does not include homicidal or suicidal plan.        Cognition and Memory: Cognition normal.        Judgment: Judgment is impulsive.    Review of  Systems  Constitutional: Negative.   HENT: Negative.    Eyes: Negative.   Respiratory: Negative.    Cardiovascular: Negative.   Gastrointestinal: Negative.   Genitourinary: Negative.   Musculoskeletal: Negative.   Skin: Negative.   Neurological: Negative.   Psychiatric/Behavioral:  Positive for depression and suicidal ideas.     Blood pressure 120/64, pulse 88, temperature 98.1 F (36.7 C), temperature source Oral, resp. rate 20, SpO2 100%. There is no height or weight on file to calculate BMI.  Past Psychiatric History: borderline personality disorder, PTSD, panic disorder ; decline from hospitalizations due to medical comorbidities these and borderline personality disorder   Is the patient at risk to self? Yes  Has the patient been a risk to self in the past 6 months? No .    Has the patient been a risk to self within the distant past? No   Is the patient a risk to others? No   Has the patient been a risk to others in the past 6 months? No   Has the patient been a risk to others within the distant past? No   Past Medical History: PCOS,  hx scarlet fever, chronic L partial achilles tear, chronic back pain from numerous spine conditions and surgeries   Family History:  family history includes Depression in her mother; Diabetes in her father and mother; Heart disease in her paternal grandmother; Hypertension in her father and mother; Kidney disease in her mother; Other in her maternal grandmother; Ovarian cancer in her maternal grandmother; Stroke in her paternal grandfather; Thyroid  disease in her mother.   Social History: Patient lives by herself in an apartment. Patient is not current employed but receives disability. Denies any substance use.  Does not have any social support except for a friend that lives in [place] .   Last Labs:  Admission on 06/21/2024  Component Date Value Ref Range Status   WBC 06/22/2024 10.7 (H)  4.0 - 10.5 K/uL Final   RBC 06/22/2024 4.49  3.87 - 5.11  MIL/uL Final   Hemoglobin 06/22/2024 13.9  12.0 - 15.0 g/dL Final   HCT 90/91/7974 41.3  36.0 - 46.0 % Final   MCV 06/22/2024 92.0  80.0 - 100.0 fL Final   MCH 06/22/2024 31.0  26.0 - 34.0 pg Final   MCHC 06/22/2024 33.7  30.0 - 36.0 g/dL Final   RDW 90/91/7974 12.6  11.5 - 15.5 % Final   Platelets 06/22/2024 308  150 - 400 K/uL Final   nRBC 06/22/2024 0.0  0.0 - 0.2 % Final   Neutrophils Relative % 06/22/2024 62  % Final   Neutro Abs 06/22/2024 6.7  1.7 - 7.7 K/uL Final   Lymphocytes Relative 06/22/2024 31  % Final   Lymphs Abs 06/22/2024 3.3  0.7 - 4.0 K/uL Final   Monocytes Relative 06/22/2024 5  % Final   Monocytes Absolute 06/22/2024 0.6  0.1 - 1.0 K/uL Final   Eosinophils Relative 06/22/2024 1  % Final   Eosinophils Absolute 06/22/2024 0.1  0.0 - 0.5 K/uL Final   Basophils Relative 06/22/2024 1  % Final   Basophils Absolute 06/22/2024 0.1  0.0 - 0.1 K/uL Final   Immature Granulocytes 06/22/2024 0  % Final   Abs Immature Granulocytes 06/22/2024 0.03  0.00 - 0.07 K/uL Final   Performed at [facility], [address], [place], [place] [address]   Sodium 06/22/2024 139  135 - 145 mmol/L Final   Potassium 06/22/2024 3.8  3.5 - 5.1 mmol/L Final   Chloride 06/22/2024 102  98 - 111 mmol/L Final   CO2 06/22/2024 26  22 - 32 mmol/L Final   Glucose, Bld 06/22/2024 92  70 - 99 mg/dL Final   Glucose reference range applies only to samples taken after fasting for at least 8 hours.   BUN 06/22/2024 19  6 - 20 mg/dL Final   Creatinine, Ser 06/22/2024 0.72  0.44 - 1.00 mg/dL Final   Calcium 90/91/7974 9.0  8.9 - 10.3 mg/dL Final   Total Protein 90/91/7974 6.3 (L)  6.5 - 8.1 g/dL Final   Albumin 90/91/7974 3.5  3.5 - 5.0 g/dL Final   AST 90/91/7974 18  15 - 41 U/L Final   ALT 06/22/2024 25  0 - 44 U/L Final   Alkaline Phosphatase 06/22/2024 92  38 - 126 U/L Final   Total Bilirubin 06/22/2024 0.8  0.0 - 1.2 mg/dL Final   GFR, Estimated 06/22/2024 >60  >60 mL/min Final   Comment:  (NOTE) Calculated using the CKD-EPI Creatinine Equation (2021)    Anion gap 06/22/2024 11  5 - 15 Final   Performed at [facility],  [address] [place], [place] [address]   Cholesterol 06/22/2024 192  0 - 200 mg/dL Final   Triglycerides 90/91/7974 54  <150 mg/dL Final   HDL 90/91/7974 58  >40 mg/dL Final   Total CHOL/HDL Ratio 06/22/2024 3.3  RATIO Final   VLDL 06/22/2024 11  0 - 40 mg/dL Final   LDL Cholesterol 06/22/2024 123 (H)  0 - 99 mg/dL Final   Comment:        Total Cholesterol/HDL:CHD Risk Coronary Heart Disease Risk Table                     Men   Women  1/2 Average Risk   3.4   3.3  Average Risk       5.0   4.4  2 X Average Risk   9.6   7.1  3 X Average Risk  23.4   11.0        Use the calculated Patient Ratio above and the CHD Risk Table to determine the patient's CHD Risk.        ATP III CLASSIFICATION (LDL):  <100     mg/dL   Optimal  899-870  mg/dL   Near or Above                    Optimal  130-159  mg/dL   Borderline  839-810  mg/dL   High  >809     mg/dL   Very High Performed at [facility], [address], [place], [place] [address]    Alcohol, Ethyl (B) 06/22/2024 <15  <15 mg/dL Final   Comment: (NOTE) For medical purposes only. Performed at [facility], [address] [place], [place] [address]   Admission on 06/13/2024, Discharged on 06/15/2024  Component Date Value Ref Range Status   Sodium 06/13/2024 140  135 - 145 mmol/L Final   Potassium 06/13/2024 4.1  3.5 - 5.1 mmol/L Final   Chloride 06/13/2024 105  98 - 111 mmol/L Final   CO2 06/13/2024 24  22 - 32 mmol/L Final   Glucose, Bld 06/13/2024 119 (H)  70 - 99 mg/dL Final   Glucose reference range applies only to samples taken after fasting for at least 8 hours.   BUN 06/13/2024 13  6 - 20 mg/dL Final   Creatinine, Ser 06/13/2024 0.80  0.44 - 1.00 mg/dL Final   Calcium 91/69/7974 9.2  8.9 - 10.3 mg/dL Final   Total Protein 91/69/7974 6.9  6.5 - 8.1 g/dL Final    Albumin 91/69/7974 4.2  3.5 - 5.0 g/dL Final   AST 91/69/7974 20  15 - 41 U/L Final   ALT 06/13/2024 25  0 - 44 U/L Final   Alkaline Phosphatase 06/13/2024 112  38 - 126 U/L Final   Total Bilirubin 06/13/2024 0.3  0.0 - 1.2 mg/dL Final   GFR, Estimated 06/13/2024 >60  >60 mL/min Final   Comment: (NOTE) Calculated using the CKD-EPI Creatinine Equation (2021)    Anion gap 06/13/2024 12  5 - 15 Final   Performed at [facility], [address], [place], [place] [address]   Alcohol, Ethyl (B) 06/13/2024 <15  <15 mg/dL Final   Comment: (NOTE) For medical purposes only. Performed at [facility], [address], [place], [place] [address]    WBC 06/13/2024 12.6 (H)  4.0 - 10.5 K/uL Final   RBC 06/13/2024 4.71  3.87 - 5.11 MIL/uL Final  Hemoglobin 06/13/2024 14.4  12.0 - 15.0 g/dL Final   HCT 91/69/7974 44.0  36.0 - 46.0 % Final   MCV 06/13/2024 93.4  80.0 - 100.0 fL Final   MCH 06/13/2024 30.6  26.0 - 34.0 pg Final   MCHC 06/13/2024 32.7  30.0 - 36.0 g/dL Final   RDW 91/69/7974 12.4  11.5 - 15.5 % Final   Platelets 06/13/2024 327  150 - 400 K/uL Final   nRBC 06/13/2024 0.0  0.0 - 0.2 % Final   Performed at [facility], [address] [place], [place] [address]   Opiates 06/14/2024 NEGATIVE  NEGATIVE Final   Cocaine 06/14/2024 NEGATIVE  NEGATIVE Final   Benzodiazepines 06/14/2024 NEGATIVE  NEGATIVE Final   Amphetamines 06/14/2024 NEGATIVE  NEGATIVE Final   Tetrahydrocannabinol 06/14/2024 NEGATIVE  NEGATIVE Final   Barbiturates 06/14/2024 NEGATIVE  NEGATIVE Final   Methadone Scn, Ur 06/14/2024 NEGATIVE  NEGATIVE Final   Fentanyl  06/14/2024 NEGATIVE  NEGATIVE Final   Comment: (NOTE) Drug screen is for Medical Purposes only. Positive results are preliminary only. If confirmation is needed, notify lab within 5 days.  Drug Class                 Cutoff (ng/mL) Amphetamine and metabolites 1000 Barbiturate and metabolites  200 Benzodiazepine              200 Opiates and metabolites     300 Cocaine and metabolites     300 THC                         50 Fentanyl                     5 Methadone                   300  Trazodone  is metabolized in vivo to several metabolites,  including pharmacologically active m-CPP, which is excreted in the  urine.  Immunoassay screens for amphetamines and MDMA have potential  cross-reactivity with these compounds and may provide false positive  result.  Performed at [facility], [address] [place], [place] [address]    Preg, Serum 06/13/2024 NEGATIVE  NEGATIVE Final   Comment:        THE SENSITIVITY OF THIS METHODOLOGY IS >10 mIU/mL. Performed at [facility], [address] [place], [place] [address]   Admission on 06/11/2024, Discharged on 06/12/2024  Component Date Value Ref Range Status   WBC 06/12/2024 10.6 (H)  4.0 - 10.5 K/uL Final   RBC 06/12/2024 5.17 (H)  3.87 - 5.11 MIL/uL Final   Hemoglobin 06/12/2024 16.0 (H)  12.0 - 15.0 g/dL Final   HCT 91/70/7974 47.9 (H)  36.0 - 46.0 % Final   MCV 06/12/2024 92.6  80.0 - 100.0 fL Final   MCH 06/12/2024 30.9  26.0 - 34.0 pg Final   MCHC 06/12/2024 33.4  30.0 - 36.0 g/dL Final   RDW 91/70/7974 12.4  11.5 - 15.5 % Final   Platelets 06/12/2024 306  150 - 400 K/uL Final   nRBC 06/12/2024 0.0  0.0 - 0.2 % Final   Neutrophils Relative % 06/12/2024 64  % Final   Neutro Abs 06/12/2024 7.0  1.7 - 7.7 K/uL Final   Lymphocytes Relative 06/12/2024 27  % Final   Lymphs Abs 06/12/2024 2.8  0.7 - 4.0 K/uL Final   Monocytes Relative 06/12/2024 5  % Final  Monocytes Absolute 06/12/2024 0.5  0.1 - 1.0 K/uL Final   Eosinophils Relative 06/12/2024 2  % Final   Eosinophils Absolute 06/12/2024 0.2  0.0 - 0.5 K/uL Final   Basophils Relative 06/12/2024 1  % Final   Basophils Absolute 06/12/2024 0.1  0.0 - 0.1 K/uL Final   Immature Granulocytes 06/12/2024 1  % Final   Abs Immature  Granulocytes 06/12/2024 0.08 (H)  0.00 - 0.07 K/uL Final   Performed at [facility], [address], [place], [place] [address]   Sodium 06/12/2024 139  135 - 145 mmol/L Final   Potassium 06/12/2024 4.0  3.5 - 5.1 mmol/L Final   Chloride 06/12/2024 104  98 - 111 mmol/L Final   CO2 06/12/2024 27  22 - 32 mmol/L Final   Glucose, Bld 06/12/2024 83  70 - 99 mg/dL Final   Glucose reference range applies only to samples taken after fasting for at least 8 hours.   BUN 06/12/2024 11  6 - 20 mg/dL Final   Creatinine, Ser 06/12/2024 0.76  0.44 - 1.00 mg/dL Final   Calcium 91/70/7974 9.3  8.9 - 10.3 mg/dL Final   Total Protein 91/70/7974 7.2  6.5 - 8.1 g/dL Final   Albumin 91/70/7974 4.0  3.5 - 5.0 g/dL Final   AST 91/70/7974 17  15 - 41 U/L Final   ALT 06/12/2024 26  0 - 44 U/L Final   Alkaline Phosphatase 06/12/2024 95  38 - 126 U/L Final   Total Bilirubin 06/12/2024 0.7  0.0 - 1.2 mg/dL Final   GFR, Estimated 06/12/2024 >60  >60 mL/min Final   Comment: (NOTE) Calculated using the CKD-EPI Creatinine Equation (2021)    Anion gap 06/12/2024 8  5 - 15 Final   Performed at [facility], [address], [place], [place] [address]   POC Amphetamine UR 06/11/2024 None Detected  NONE DETECTED (Cut Off Level 1000 ng/mL) Final   POC Secobarbital (BAR) 06/11/2024 None Detected  NONE DETECTED (Cut Off Level 300 ng/mL) Final   POC Buprenorphine (BUP) 06/11/2024 None Detected  NONE DETECTED (Cut Off Level 10 ng/mL) Final   POC Oxazepam (BZO) 06/11/2024 Positive (A)  NONE DETECTED (Cut Off Level 300 ng/mL) Final   POC Cocaine UR 06/11/2024 None Detected  NONE DETECTED (Cut Off Level 300 ng/mL) Final   POC Methamphetamine UR 06/11/2024 None Detected  NONE DETECTED (Cut Off Level 1000 ng/mL) Final   POC Morphine 06/11/2024 None Detected  NONE DETECTED (Cut Off Level 300 ng/mL) Final   POC Methadone UR 06/11/2024 None Detected  NONE DETECTED (Cut Off Level 300 ng/mL) Final   POC Oxycodone  UR  06/11/2024 None Detected  NONE DETECTED (Cut Off Level 100 ng/mL) Final   POC Marijuana UR 06/11/2024 None Detected  NONE DETECTED (Cut Off Level 50 ng/mL) Final   Preg Test, Ur 06/11/2024 Negative  Negative Final  Admission on 06/03/2024, Discharged on 06/04/2024  Component Date Value Ref Range Status   WBC 06/03/2024 14.1 (H)  4.0 - 10.5 K/uL Final   RBC 06/03/2024 4.86  3.87 - 5.11 MIL/uL Final   Hemoglobin 06/03/2024 15.1 (H)  12.0 - 15.0 g/dL Final   HCT 91/79/7974 44.3  36.0 - 46.0 % Final   MCV 06/03/2024 91.2  80.0 - 100.0 fL Final   MCH 06/03/2024 31.1  26.0 - 34.0 pg Final   MCHC 06/03/2024 34.1  30.0 - 36.0 g/dL Final   RDW 91/79/7974 12.5  11.5 - 15.5 % Final  Platelets 06/03/2024 308  150 - 400 K/uL Final   nRBC 06/03/2024 0.0  0.0 - 0.2 % Final   Neutrophils Relative % 06/03/2024 71  % Final   Neutro Abs 06/03/2024 10.1 (H)  1.7 - 7.7 K/uL Final   Lymphocytes Relative 06/03/2024 21  % Final   Lymphs Abs 06/03/2024 3.0  0.7 - 4.0 K/uL Final   Monocytes Relative 06/03/2024 5  % Final   Monocytes Absolute 06/03/2024 0.7  0.1 - 1.0 K/uL Final   Eosinophils Relative 06/03/2024 1  % Final   Eosinophils Absolute 06/03/2024 0.1  0.0 - 0.5 K/uL Final   Basophils Relative 06/03/2024 1  % Final   Basophils Absolute 06/03/2024 0.1  0.0 - 0.1 K/uL Final   Immature Granulocytes 06/03/2024 1  % Final   Abs Immature Granulocytes 06/03/2024 0.09 (H)  0.00 - 0.07 K/uL Final   Performed at [facility], [address], [place], [place] [address]   Sodium 06/03/2024 139  135 - 145 mmol/L Final   Potassium 06/03/2024 4.1  3.5 - 5.1 mmol/L Final   Chloride 06/03/2024 103  98 - 111 mmol/L Final   CO2 06/03/2024 26  22 - 32 mmol/L Final   Glucose, Bld 06/03/2024 80  70 - 99 mg/dL Final   Glucose reference range applies only to samples taken after fasting for at least 8 hours.   BUN 06/03/2024 18  6 - 20 mg/dL Final   Creatinine, Ser 06/03/2024 0.78  0.44 - 1.00 mg/dL Final   Calcium  91/79/7974 9.1  8.9 - 10.3 mg/dL Final   Total Protein 91/79/7974 6.7  6.5 - 8.1 g/dL Final   Albumin 91/79/7974 3.8  3.5 - 5.0 g/dL Final   AST 91/79/7974 22  15 - 41 U/L Final   ALT 06/03/2024 29  0 - 44 U/L Final   Alkaline Phosphatase 06/03/2024 86  38 - 126 U/L Final   Total Bilirubin 06/03/2024 0.5  0.0 - 1.2 mg/dL Final   GFR, Estimated 06/03/2024 >60  >60 mL/min Final   Comment: (NOTE) Calculated using the CKD-EPI Creatinine Equation (2021)    Anion gap 06/03/2024 10  5 - 15 Final   Performed at [facility], [address], [place], [place] [address]   Alcohol, Ethyl (B) 06/03/2024 <15  <15 mg/dL Final   Comment: (NOTE) For medical purposes only. Performed at [facility], [address] [place], [place] [address]    Preg Test, Ur 06/03/2024 Negative  Negative Final   POC Amphetamine UR 06/03/2024 None Detected  NONE DETECTED (Cut Off Level 1000 ng/mL) Final   POC Secobarbital (BAR) 06/03/2024 None Detected  NONE DETECTED (Cut Off Level 300 ng/mL) Final   POC Buprenorphine (BUP) 06/03/2024 None Detected  NONE DETECTED (Cut Off Level 10 ng/mL) Final   POC Oxazepam (BZO) 06/03/2024 None Detected  NONE DETECTED (Cut Off Level 300 ng/mL) Final   POC Cocaine UR 06/03/2024 None Detected  NONE DETECTED (Cut Off Level 300 ng/mL) Final   POC Methamphetamine UR 06/03/2024 None Detected  NONE DETECTED (Cut Off Level 1000 ng/mL) Final   POC Morphine 06/03/2024 None Detected  NONE DETECTED (Cut Off Level 300 ng/mL) Final   POC Methadone UR 06/03/2024 None Detected  NONE DETECTED (Cut Off Level 300 ng/mL) Final   POC Oxycodone  UR 06/03/2024 None Detected  NONE DETECTED (Cut Off Level 100 ng/mL) Final   POC Marijuana UR 06/03/2024 None Detected  NONE DETECTED (Cut Off Level 50  ng/mL) Final  Admission on 04/20/2024, Discharged on 04/21/2024  Component Date Value Ref Range Status   Sodium 04/21/2024 135  135 - 145 mmol/L Final   Potassium 04/21/2024 3.5  3.5 - 5.1 mmol/L Final    Chloride 04/21/2024 101  98 - 111 mmol/L Final   CO2 04/21/2024 24  22 - 32 mmol/L Final   Glucose, Bld 04/21/2024 95  70 - 99 mg/dL Final   Glucose reference range applies only to samples taken after fasting for at least 8 hours.   BUN 04/21/2024 16  6 - 20 mg/dL Final   Creatinine, Ser 04/21/2024 0.85  0.44 - 1.00 mg/dL Final   Calcium 92/91/7974 8.9  8.9 - 10.3 mg/dL Final   Total Protein 92/91/7974 7.1  6.5 - 8.1 g/dL Final   Albumin 92/91/7974 4.2  3.5 - 5.0 g/dL Final   AST 92/91/7974 26  15 - 41 U/L Final   ALT 04/21/2024 35  0 - 44 U/L Final   Alkaline Phosphatase 04/21/2024 73  38 - 126 U/L Final   Total Bilirubin 04/21/2024 0.9  0.0 - 1.2 mg/dL Final   GFR, Estimated 04/21/2024 >60  >60 mL/min Final   Comment: (NOTE) Calculated using the CKD-EPI Creatinine Equation (2021)    Anion gap 04/21/2024 10  5 - 15 Final   Performed at [facility], [address], [place], [place] [address]   Alcohol, Ethyl (B) 04/21/2024 <15  <15 mg/dL Final   Comment: (NOTE) For medical purposes only. Performed at [facility], [address], [place], [place] [address]    WBC 04/21/2024 10.1  4.0 - 10.5 K/uL Final   RBC 04/21/2024 4.40  3.87 - 5.11 MIL/uL Final   Hemoglobin 04/21/2024 13.5  12.0 - 15.0 g/dL Final   HCT 92/91/7974 40.6  36.0 - 46.0 % Final   MCV 04/21/2024 92.3  80.0 - 100.0 fL Final   MCH 04/21/2024 30.7  26.0 - 34.0 pg Final   MCHC 04/21/2024 33.3  30.0 - 36.0 g/dL Final   RDW 92/91/7974 12.4  11.5 - 15.5 % Final   Platelets 04/21/2024 275  150 - 400 K/uL Final   nRBC 04/21/2024 0.0  0.0 - 0.2 % Final   Performed at [facility], [address] [place], [place] [address]   Opiates 04/21/2024 NONE DETECTED  NONE DETECTED Final   Cocaine 04/21/2024 NONE DETECTED  NONE DETECTED Final   Benzodiazepines 04/21/2024 NONE DETECTED  NONE DETECTED Final   Amphetamines 04/21/2024 NONE DETECTED  NONE DETECTED Final    Tetrahydrocannabinol 04/21/2024 NONE DETECTED  NONE DETECTED Final   Barbiturates 04/21/2024 NONE DETECTED  NONE DETECTED Final   Comment: (NOTE) DRUG SCREEN FOR MEDICAL PURPOSES ONLY.  IF CONFIRMATION IS NEEDED FOR ANY PURPOSE, NOTIFY LAB WITHIN 5 DAYS.  LOWEST DETECTABLE LIMITS FOR URINE DRUG SCREEN Drug Class                     Cutoff (ng/mL) Amphetamine and metabolites    1000 Barbiturate and metabolites    200 Benzodiazepine                 200 Opiates and metabolites        300 Cocaine and metabolites        300 THC                            50 Performed at  [facility], [address] [place] [place], [place] [address]    Preg, Serum 04/21/2024 NEGATIVE  NEGATIVE Final   Comment:        THE SENSITIVITY OF THIS METHODOLOGY IS >10 mIU/mL. Performed at [facility], [address], [place], [place] [address]   Admission on 04/09/2024, Discharged on 04/10/2024  Component Date Value Ref Range Status   Sodium 04/09/2024 139  135 - 145 mmol/L Final   Potassium 04/09/2024 3.7  3.5 - 5.1 mmol/L Final   Chloride 04/09/2024 103  98 - 111 mmol/L Final   CO2 04/09/2024 24  22 - 32 mmol/L Final   Glucose, Bld 04/09/2024 91  70 - 99 mg/dL Final   Glucose reference range applies only to samples taken after fasting for at least 8 hours.   BUN 04/09/2024 18  6 - 20 mg/dL Final   Creatinine, Ser 04/09/2024 1.02 (H)  0.44 - 1.00 mg/dL Final   Calcium 93/73/7974 9.2  8.9 - 10.3 mg/dL Final   Total Protein 93/73/7974 7.1  6.5 - 8.1 g/dL Final   Albumin 93/73/7974 3.9  3.5 - 5.0 g/dL Final   AST 93/73/7974 30  15 - 41 U/L Final   ALT 04/09/2024 39  0 - 44 U/L Final   Alkaline Phosphatase 04/09/2024 83  38 - 126 U/L Final   Total Bilirubin 04/09/2024 1.2  0.0 - 1.2 mg/dL Final   GFR, Estimated 04/09/2024 >60  >60 mL/min Final   Comment: (NOTE) Calculated using the CKD-EPI Creatinine Equation (2021)    Anion gap 04/09/2024 12  5 - 15 Final   Performed at  [facility], [address], [place], [place] [address]   Alcohol, Ethyl (B) 04/09/2024 <15  <15 mg/dL Final   Comment: (NOTE) For medical purposes only. Performed at [facility], [address] [place], [place] [address]    Opiates 04/09/2024 NONE DETECTED  NONE DETECTED Final   Cocaine 04/09/2024 NONE DETECTED  NONE DETECTED Final   Benzodiazepines 04/09/2024 POSITIVE (A)  NONE DETECTED Final   Amphetamines 04/09/2024 NONE DETECTED  NONE DETECTED Final   Tetrahydrocannabinol 04/09/2024 NONE DETECTED  NONE DETECTED Final   Barbiturates 04/09/2024 NONE DETECTED  NONE DETECTED Final   Comment: (NOTE) DRUG SCREEN FOR MEDICAL PURPOSES ONLY.  IF CONFIRMATION IS NEEDED FOR ANY PURPOSE, NOTIFY LAB WITHIN 5 DAYS.  LOWEST DETECTABLE LIMITS FOR URINE DRUG SCREEN Drug Class                     Cutoff (ng/mL) Amphetamine and metabolites    1000 Barbiturate and metabolites    200 Benzodiazepine                 200 Opiates and metabolites        300 Cocaine and metabolites        300 THC                            50 Performed at [facility], [address], [place], [place] [address]    WBC 04/09/2024 11.3 (H)  4.0 - 10.5 K/uL Final   RBC 04/09/2024 4.72  3.87 - 5.11 MIL/uL Final   Hemoglobin 04/09/2024 14.4  12.0 - 15.0 g/dL Final   HCT 93/73/7974 43.4  36.0 - 46.0 % Final   MCV 04/09/2024 91.9  80.0 - 100.0 fL Final   MCH 04/09/2024 30.5  26.0 - 34.0 pg Final   MCHC 04/09/2024 33.2  30.0 - 36.0 g/dL Final   RDW 93/73/7974 12.2  11.5 - 15.5 % Final   Platelets 04/09/2024 295  150 - 400 K/uL Final   nRBC 04/09/2024 0.0  0.0 - 0.2 % Final   Neutrophils Relative % 04/09/2024 68  % Final   Neutro Abs 04/09/2024 7.6  1.7 - 7.7 K/uL Final   Lymphocytes Relative 04/09/2024 26  % Final   Lymphs Abs 04/09/2024 3.0  0.7 - 4.0 K/uL Final   Monocytes Relative 04/09/2024 5  % Final   Monocytes Absolute 04/09/2024 0.6  0.1 - 1.0 K/uL  Final   Eosinophils Relative 04/09/2024 1  % Final   Eosinophils Absolute 04/09/2024 0.1  0.0 - 0.5 K/uL Final   Basophils Relative 04/09/2024 0  % Final   Basophils Absolute 04/09/2024 0.0  0.0 - 0.1 K/uL Final   Immature Granulocytes 04/09/2024 0  % Final   Abs Immature Granulocytes 04/09/2024 0.04  0.00 - 0.07 K/uL Final   Performed at [facility], [address] [place], [place] [address]   Preg, Serum 04/09/2024 NEGATIVE  NEGATIVE Final   Comment:        THE SENSITIVITY OF THIS METHODOLOGY IS >10 mIU/mL. Performed at [facility], [address], [place], [place] [address]    Color, Urine 04/09/2024 YELLOW  YELLOW Final   APPearance 04/09/2024 HAZY (A)  CLEAR Final   Specific Gravity, Urine 04/09/2024 1.031 (H)  1.005 - 1.030 Final   pH 04/09/2024 5.0  5.0 - 8.0 Final   Glucose, UA 04/09/2024 NEGATIVE  NEGATIVE mg/dL Final   Hgb urine dipstick 04/09/2024 SMALL (A)  NEGATIVE Final   Bilirubin Urine 04/09/2024 NEGATIVE  NEGATIVE Final   Ketones, ur 04/09/2024 NEGATIVE  NEGATIVE mg/dL Final   Protein, ur 93/73/7974 NEGATIVE  NEGATIVE mg/dL Final   Nitrite 93/73/7974 NEGATIVE  NEGATIVE Final   Leukocytes,Ua 04/09/2024 TRACE (A)  NEGATIVE Final   RBC / HPF 04/09/2024 0-5  0 - 5 RBC/hpf Final   WBC, UA 04/09/2024 0-5  0 - 5 WBC/hpf Final   Bacteria, UA 04/09/2024 RARE (A)  NONE SEEN Final   Squamous Epithelial / HPF 04/09/2024 0-5  0 - 5 /HPF Final   Mucus 04/09/2024 PRESENT   Final   Hyaline Casts, UA 04/09/2024 PRESENT   Final   Performed at [facility], [address] [place], [place] [address]   Magnesium  04/09/2024 2.0  1.7 - 2.4 mg/dL Final   Performed at [facility], [address], [place], [place] [address]  Admission on 03/16/2024, Discharged on 03/17/2024  Component Date Value Ref Range Status   WBC 03/16/2024 8.5  4.0 - 10.5 K/uL Final   RBC 03/16/2024 4.44  3.87 - 5.11 MIL/uL Final    Hemoglobin 03/16/2024 13.8  12.0 - 15.0 g/dL Final   HCT 93/97/7974 41.2  36.0 - 46.0 % Final   MCV 03/16/2024 92.8  80.0 - 100.0 fL Final   MCH 03/16/2024 31.1  26.0 - 34.0 pg Final   MCHC 03/16/2024 33.5  30.0 - 36.0 g/dL Final   RDW 93/97/7974 12.6  11.5 - 15.5 % Final   Platelets 03/16/2024 266  150 - 400 K/uL Final   nRBC 03/16/2024 0.0  0.0 - 0.2 % Final   Neutrophils Relative % 03/16/2024 56  % Final   Neutro Abs 03/16/2024 4.8  1.7 - 7.7 K/uL Final  Lymphocytes Relative 03/16/2024 34  % Final   Lymphs Abs 03/16/2024 2.8  0.7 - 4.0 K/uL Final   Monocytes Relative 03/16/2024 7  % Final   Monocytes Absolute 03/16/2024 0.6  0.1 - 1.0 K/uL Final   Eosinophils Relative 03/16/2024 2  % Final   Eosinophils Absolute 03/16/2024 0.2  0.0 - 0.5 K/uL Final   Basophils Relative 03/16/2024 1  % Final   Basophils Absolute 03/16/2024 0.0  0.0 - 0.1 K/uL Final   Immature Granulocytes 03/16/2024 0  % Final   Abs Immature Granulocytes 03/16/2024 0.03  0.00 - 0.07 K/uL Final   Performed at [facility], [address] [place], [place] [address]   Sodium 03/16/2024 141  135 - 145 mmol/L Final   Potassium 03/16/2024 3.9  3.5 - 5.1 mmol/L Final   Chloride 03/16/2024 107  98 - 111 mmol/L Final   CO2 03/16/2024 25  22 - 32 mmol/L Final   Glucose, Bld 03/16/2024 113 (H)  70 - 99 mg/dL Final   Glucose reference range applies only to samples taken after fasting for at least 8 hours.   BUN 03/16/2024 19  6 - 20 mg/dL Final   Creatinine, Ser 03/16/2024 1.24 (H)  0.44 - 1.00 mg/dL Final   Calcium 93/97/7974 9.2  8.9 - 10.3 mg/dL Final   Total Protein 93/97/7974 5.8 (L)  6.5 - 8.1 g/dL Final   Albumin 93/97/7974 3.5  3.5 - 5.0 g/dL Final   AST 93/97/7974 23  15 - 41 U/L Final   ALT 03/16/2024 39  0 - 44 U/L Final   Alkaline Phosphatase 03/16/2024 76  38 - 126 U/L Final   Total Bilirubin 03/16/2024 0.5  0.0 - 1.2 mg/dL Final   GFR, Estimated 03/16/2024 57 (L)  >60 mL/min Final   Comment:  (NOTE) Calculated using the CKD-EPI Creatinine Equation (2021)    Anion gap 03/16/2024 9  5 - 15 Final   Performed at [facility], [address] [place], [place] [address]   Hgb A1c MFr Bld 03/16/2024 4.6 (L)  4.8 - 5.6 % Final   Comment: (NOTE) Diagnosis of Diabetes The following HbA1c ranges recommended by the American Diabetes Association (ADA) may be used as an aid in the diagnosis of diabetes mellitus.  Hemoglobin             Suggested A1C NGSP%              Diagnosis  <5.7                   Non Diabetic  5.7-6.4                Pre-Diabetic  >6.4                   Diabetic  <7.0                   Glycemic control for                       adults with diabetes.     Mean Plasma Glucose 03/16/2024 85.32  mg/dL Final   Performed at [facility], [address], [place], [place] [address]   Cholesterol 03/16/2024 169  0 - 200 mg/dL Final   Triglycerides 93/97/7974 127  <150 mg/dL Final   HDL 93/97/7974 38 (L)  >40 mg/dL Final   Total CHOL/HDL Ratio 03/16/2024 4.4  RATIO Final  VLDL 03/16/2024 25  0 - 40 mg/dL Final   LDL Cholesterol 03/16/2024 106 (H)  0 - 99 mg/dL Final   Comment:        Total Cholesterol/HDL:CHD Risk Coronary Heart Disease Risk Table                     Men   Women  1/2 Average Risk   3.4   3.3  Average Risk       5.0   4.4  2 X Average Risk   9.6   7.1  3 X Average Risk  23.4   11.0        Use the calculated Patient Ratio above and the CHD Risk Table to determine the patient's CHD Risk.        ATP III CLASSIFICATION (LDL):  <100     mg/dL   Optimal  899-870  mg/dL   Near or Above                    Optimal  130-159  mg/dL   Borderline  839-810  mg/dL   High  >809     mg/dL   Very High Performed at [facility], [address], [place], [place] [address]    TSH 03/16/2024 2.009  0.350 - 4.500 uIU/mL Final   Comment: Performed by a 3rd Generation assay with a functional sensitivity of <=0.01 uIU/mL. Performed at [facility], [address], [place], [place] [address]   Admission on 02/25/2024, Discharged on 02/26/2024  Component Date Value Ref Range Status   Sodium 02/25/2024 140  135 - 145 mmol/L Final   Potassium 02/25/2024 3.5  3.5 - 5.1 mmol/L Final   Chloride 02/25/2024 104  98 - 111 mmol/L Final   CO2 02/25/2024 22  22 - 32 mmol/L Final   Glucose, Bld 02/25/2024 129 (H)  70 - 99 mg/dL Final   Glucose reference range applies only to samples taken after fasting for at least 8 hours.   BUN 02/25/2024 11  6 - 20 mg/dL Final   Creatinine, Ser 02/25/2024 0.88  0.44 - 1.00 mg/dL Final   Calcium 94/86/7974 9.3  8.9 - 10.3 mg/dL Final   Total Protein 94/86/7974 7.3  6.5 - 8.1 g/dL Final   Albumin 94/86/7974 4.0  3.5 - 5.0 g/dL Final   AST 94/86/7974 29  15 - 41 U/L Final   ALT 02/25/2024 43  0 - 44 U/L Final   Alkaline Phosphatase 02/25/2024 75  38 - 126 U/L Final   Total Bilirubin 02/25/2024 1.0  0.0 - 1.2 mg/dL Final   GFR, Estimated 02/25/2024 >60  >60 mL/min Final   Comment: (NOTE) Calculated using the CKD-EPI Creatinine Equation (2021)    Anion gap 02/25/2024 14  5 - 15 Final   Performed at [facility], [address], [place], [place] [address]   Alcohol, Ethyl (B) 02/25/2024 <15  <15 mg/dL Final   Comment: Please note change in reference range. (NOTE) For medical purposes only. Performed at [facility], [address], [place], [place] [address]    WBC 02/25/2024 11.0 (H)  4.0 - 10.5 K/uL Final   RBC 02/25/2024 5.04  3.87 - 5.11 MIL/uL Final   Hemoglobin 02/25/2024 15.6 (H)  12.0 - 15.0 g/dL Final   HCT 94/86/7974 46.1 (H)  36.0 - 46.0 % Final   MCV 02/25/2024 91.5  80.0 - 100.0 fL Final  MCH 02/25/2024 31.0  26.0 - 34.0 pg Final   MCHC 02/25/2024 33.8  30.0 - 36.0 g/dL Final   RDW 94/86/7974 12.8  11.5 - 15.5 % Final   Platelets 02/25/2024 313  150 - 400 K/uL Final   nRBC 02/25/2024 0.0  0.0 - 0.2 % Final   Performed at [facility], [address]  [place], [place] [address]   Opiates 02/25/2024 NONE DETECTED  NONE DETECTED Final   Cocaine 02/25/2024 NONE DETECTED  NONE DETECTED Final   Benzodiazepines 02/25/2024 NONE DETECTED  NONE DETECTED Final   Amphetamines 02/25/2024 NONE DETECTED  NONE DETECTED Final   Tetrahydrocannabinol 02/25/2024 NONE DETECTED  NONE DETECTED Final   Barbiturates 02/25/2024 NONE DETECTED  NONE DETECTED Final   Comment: (NOTE) DRUG SCREEN FOR MEDICAL PURPOSES ONLY.  IF CONFIRMATION IS NEEDED FOR ANY PURPOSE, NOTIFY LAB WITHIN 5 DAYS.  LOWEST DETECTABLE LIMITS FOR URINE DRUG SCREEN Drug Class                     Cutoff (ng/mL) Amphetamine and metabolites    1000 Barbiturate and metabolites    200 Benzodiazepine                 200 Opiates and metabolites        300 Cocaine and metabolites        300 THC                            50 Performed at [facility], [address], [place], [place] [address]    Preg, Serum 02/25/2024 NEGATIVE  NEGATIVE Final   Comment:        THE SENSITIVITY OF THIS METHODOLOGY IS >10 mIU/mL. Performed at [facility], [address], [place], [place] [address]    Acetaminophen  (Tylenol ), Serum 02/25/2024 <10 (L)  10 - 30 ug/mL Final   Comment: (NOTE) Therapeutic concentrations vary significantly. A range of 10-30 ug/mL  may be an effective concentration for many patients. However, some  are best treated at concentrations outside of this range. Acetaminophen  concentrations >150 ug/mL at 4 hours after ingestion  and >50 ug/mL at 12 hours after ingestion are often associated with  toxic reactions.  Performed at [facility], [address], [place], [place] [address]    Salicylate Lvl 02/25/2024 <7.0 (L)  7.0 - 30.0 mg/dL Final   Performed at [facility], [address], [place], [place] [address]  Admission on 02/21/2024, Discharged on 02/21/2024  Component Date Value Ref Range Status   WBC 02/21/2024 7.5  4.0 - 10.5 K/uL Final   RBC 02/21/2024  4.44  3.87 - 5.11 MIL/uL Final   Hemoglobin 02/21/2024 13.7  12.0 - 15.0 g/dL Final   HCT 94/90/7974 40.1  36.0 - 46.0 % Final   MCV 02/21/2024 90.3  80.0 - 100.0 fL Final   MCH 02/21/2024 30.9  26.0 - 34.0 pg Final   MCHC 02/21/2024 34.2  30.0 - 36.0 g/dL Final   RDW 94/90/7974 13.1  11.5 - 15.5 % Final   Platelets 02/21/2024 235  150 - 400 K/uL Final   nRBC 02/21/2024 0.0  0.0 - 0.2 % Final   Performed at [facility], [address], [place], [place] [address]   Sodium 02/21/2024 143  135 - 145 mmol/L Final   Potassium 02/21/2024 3.9  3.5 - 5.1 mmol/L Final  Chloride 02/21/2024 106  98 - 111 mmol/L Final   CO2 02/21/2024 26  22 - 32 mmol/L Final   Glucose, Bld 02/21/2024 100 (H)  70 - 99 mg/dL Final   Glucose reference range applies only to samples taken after fasting for at least 8 hours.   BUN 02/21/2024 14  6 - 20 mg/dL Final   Creatinine, Ser 02/21/2024 0.95  0.44 - 1.00 mg/dL Final   Calcium 94/90/7974 9.3  8.9 - 10.3 mg/dL Final   Total Protein 94/90/7974 6.3 (L)  6.5 - 8.1 g/dL Final   Albumin 94/90/7974 3.9  3.5 - 5.0 g/dL Final   AST 94/90/7974 28  15 - 41 U/L Final   ALT 02/21/2024 42  0 - 44 U/L Final   Alkaline Phosphatase 02/21/2024 92  38 - 126 U/L Final   Total Bilirubin 02/21/2024 0.5  0.0 - 1.2 mg/dL Final   GFR, Estimated 02/21/2024 >60  >60 mL/min Final   Comment: (NOTE) Calculated using the CKD-EPI Creatinine Equation (2021)    Anion gap 02/21/2024 11  5 - 15 Final   Performed at [facility], [address], [place], [place] [address]   TSH 02/21/2024 2.170  0.350 - 4.500 uIU/mL Final   Performed at [facility], [address], [place], [place] [address]   Preg, Serum 02/21/2024 NEGATIVE  NEGATIVE Final   Comment:        THE SENSITIVITY OF THIS METHODOLOGY IS >10 mIU/mL. Performed at [facility], [address], [place], [place] [address]   Admission on 02/20/2024,  Discharged on 02/20/2024  Component Date Value Ref Range Status   Color, UA 02/20/2024 yellow  yellow Final   Clarity, UA 02/20/2024 clear  clear Final   Glucose, UA 02/20/2024 negative  negative mg/dL Final   Bilirubin, UA 94/91/7974 negative  negative Final   Ketones, POC UA 02/20/2024 negative  negative mg/dL Final   Spec Grav, UA 94/91/7974 1.025  1.010 - 1.025 Final   Blood, UA 02/20/2024 trace-lysed (A)  negative Final   pH, UA 02/20/2024 6.0  5.0 - 8.0 Final   Protein Ur, POC 02/20/2024 negative  negative mg/dL Final   Urobilinogen, UA 02/20/2024 1.0  0.2 or 1.0 E.U./dL Final   Nitrite, UA 94/91/7974 Negative  Negative Final   Leukocytes, UA 02/20/2024 Negative  Negative Final   Preg Test, Ur 02/20/2024 Negative  Negative Final  There may be more visits with results that are not included.    Allergies: Adhesive [tape], Cherry, Silicone, Sulfamethoxazole-trimethoprim, Ibuprofen , Levofloxacin, Morphine, Tizanidine, Gabapentin, Metformin, Sulfa antibiotics, and Sumatriptan  Medications:  Facility Ordered Medications  Medication   acetaminophen  (TYLENOL ) tablet 650 mg   alum & mag hydroxide-simeth (MAALOX/MYLANTA) 200-200-20 MG/5ML suspension 30 mL   magnesium  hydroxide (MILK OF MAGNESIA) suspension 30 mL   haloperidol  (HALDOL ) tablet 5 mg   And   diphenhydrAMINE  (BENADRYL ) capsule 50 mg   haloperidol  lactate (HALDOL ) injection 5 mg   And   diphenhydrAMINE  (BENADRYL ) injection 50 mg   And   LORazepam  (ATIVAN ) injection 2 mg   haloperidol  lactate (HALDOL ) injection 10 mg   And   diphenhydrAMINE  (BENADRYL ) injection 50 mg   And   LORazepam  (ATIVAN ) injection 2 mg   hydrOXYzine  (ATARAX ) tablet 25 mg   traZODone  (DESYREL ) tablet 50 mg   escitalopram  (LEXAPRO ) tablet 10 mg   ARIPiprazole  (ABILIFY ) tablet 5 mg   PTA Medications  Medication Sig   ergocalciferol  (VITAMIN D2) 1.25 MG (50000 UT) capsule Take  1 capsule (50,000 Units total) by mouth once a week.   levonorgestrel  (MIRENA, 52 MG,) 20 MCG/DAY IUD 1 each by Intrauterine route once.   methocarbamol  (ROBAXIN ) 500 MG tablet Take 500 mg by mouth 2 (two) times daily as needed for muscle spasms.   polyethylene glycol powder (GLYCOLAX /MIRALAX ) 17 GM/SCOOP powder Take 17 g by mouth daily as needed for mild constipation or moderate constipation.   sennosides-docusate sodium  (SENOKOT-S) 8.6-50 MG tablet Take 1 tablet by mouth as needed for constipation.   escitalopram  (LEXAPRO ) 10 MG tablet Take 1 tablet (10 mg total) by mouth at bedtime. (Patient taking differently: Take 10 mg by mouth in the morning.)   hydrOXYzine  (ATARAX ) 25 MG tablet Take 1 tablet (25 mg total) by mouth 3 (three) times daily as needed for anxiety.   ARIPiprazole  (ABILIFY ) 10 MG tablet Take 10 mg by mouth daily.   Scar Treatment Products [facility] ADVANCED SCAR GEL EX) Apply 1 Application topically daily.   neomycin -bacitracin -polymyxin (NEOSPORIN) OINT Apply 1 Application topically as needed for wound care.   acetaminophen  (TYLENOL ) 500 MG tablet Take 1,000 mg by mouth 3 (three) times daily as needed for mild pain (pain score 1-3) or moderate pain (pain score 4-6).   LORazepam  (ATIVAN ) 1 MG tablet Take 1 mg by mouth at bedtime as needed for sleep.      Medical Decision Making  Mackensi Mahadeo is a 38 y/o female presenting to [facility] with a psychiatric history of borderline personality disorder, PTSD panic disorder presenting voluntarily and unaccompanied with complaints of depression and passive suicidal ideations.  Patient has been engaging in self injurious behaviors by using a razor to on bilateral arms.    Recommendations  Based on my evaluation the patient does not appear to have an emergency medical condition. Patient will be admitted to [facility] continuous observation for crisis management, safety and stabilization.  Eulanda Dorion E Jeanell Mangan, NP 06/22/24  7:23 AM

## 2024-06-22 NOTE — ED Notes (Signed)
 Patient was unable to give sample for POCT Urine Drug Screen.

## 2024-06-30 ENCOUNTER — Encounter (INDEPENDENT_AMBULATORY_CARE_PROVIDER_SITE_OTHER): Payer: Self-pay

## 2024-07-14 ENCOUNTER — Other Ambulatory Visit: Payer: Self-pay

## 2024-07-14 ENCOUNTER — Ambulatory Visit: Attending: Nurse Practitioner

## 2024-07-14 DIAGNOSIS — R102 Pelvic and perineal pain unspecified side: Secondary | ICD-10-CM

## 2024-07-14 DIAGNOSIS — R293 Abnormal posture: Secondary | ICD-10-CM | POA: Diagnosis present

## 2024-07-14 DIAGNOSIS — M62838 Other muscle spasm: Secondary | ICD-10-CM | POA: Diagnosis present

## 2024-07-14 DIAGNOSIS — M5459 Other low back pain: Secondary | ICD-10-CM | POA: Diagnosis present

## 2024-07-14 DIAGNOSIS — M6281 Muscle weakness (generalized): Secondary | ICD-10-CM | POA: Diagnosis present

## 2024-07-14 DIAGNOSIS — R279 Unspecified lack of coordination: Secondary | ICD-10-CM | POA: Insufficient documentation
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
## 2024-07-15 ENCOUNTER — Emergency Department (HOSPITAL_BASED_OUTPATIENT_CLINIC_OR_DEPARTMENT_OTHER)

## 2024-07-15 ENCOUNTER — Emergency Department (HOSPITAL_BASED_OUTPATIENT_CLINIC_OR_DEPARTMENT_OTHER)
Admission: EM | Admit: 2024-07-15 | Discharge: 2024-07-16 | Disposition: A | Discharge status: Home / Self Care | Source: Ambulatory Visit | Attending: Emergency Medicine | Admitting: Emergency Medicine

## 2024-07-15 ENCOUNTER — Other Ambulatory Visit: Payer: Self-pay

## 2024-07-15 ENCOUNTER — Encounter (HOSPITAL_BASED_OUTPATIENT_CLINIC_OR_DEPARTMENT_OTHER): Payer: Self-pay | Admitting: Emergency Medicine

## 2024-07-15 DIAGNOSIS — R319 Hematuria, unspecified: Secondary | ICD-10-CM | POA: Diagnosis present

## 2024-07-15 DIAGNOSIS — E785 Hyperlipidemia, unspecified: Secondary | ICD-10-CM | POA: Insufficient documentation

## 2024-07-15 DIAGNOSIS — E282 Polycystic ovarian syndrome: Secondary | ICD-10-CM | POA: Insufficient documentation

## 2024-07-15 DIAGNOSIS — R1032 Left lower quadrant pain: Secondary | ICD-10-CM | POA: Diagnosis not present

## 2024-07-15 DIAGNOSIS — Z6841 Body Mass Index (BMI) 40.0 and over, adult: Secondary | ICD-10-CM | POA: Diagnosis not present

## 2024-07-15 DIAGNOSIS — R102 Pelvic and perineal pain unspecified side: Secondary | ICD-10-CM

## 2024-07-15 DIAGNOSIS — E669 Obesity, unspecified: Secondary | ICD-10-CM | POA: Insufficient documentation

## 2024-07-15 DIAGNOSIS — R1024 Suprapubic pain: Secondary | ICD-10-CM | POA: Insufficient documentation

## 2024-07-15 DIAGNOSIS — R109 Unspecified abdominal pain: Secondary | ICD-10-CM | POA: Diagnosis present

## 2024-07-15 LAB — COMPREHENSIVE METABOLIC PANEL WITH GFR
ALT: 21 U/L (ref 0–44)
AST: 18 U/L (ref 15–41)
Albumin: 4.1 g/dL (ref 3.5–5.0)
Alkaline Phosphatase: 111 U/L (ref 38–126)
Anion gap: 13 (ref 5–15)
BUN: 15 mg/dL (ref 6–20)
CO2: 23 mmol/L (ref 22–32)
Calcium: 9.2 mg/dL (ref 8.9–10.3)
Chloride: 102 mmol/L (ref 98–111)
Creatinine, Ser: 0.73 mg/dL (ref 0.44–1.00)
GFR, Estimated: >60 mL/min (ref >60)
Glucose, Bld: 102 mg/dL — ABNORMAL HIGH (ref 70–99)
Potassium: 3.8 mmol/L (ref 3.5–5.1)
Sodium: 137 mmol/L (ref 135–145)
Total Bilirubin: 0.3 mg/dL (ref 0.0–1.2)
Total Protein: 7 g/dL (ref 6.5–8.1)

## 2024-07-15 LAB — URINALYSIS, ROUTINE W REFLEX MICROSCOPIC
Bilirubin Urine: NEGATIVE
Glucose, UA: NEGATIVE mg/dL
Ketones, ur: NEGATIVE mg/dL
Leukocytes,Ua: NEGATIVE
Nitrite: NEGATIVE
Protein, ur: NEGATIVE mg/dL
Specific Gravity, Urine: 1.028 (ref 1.005–1.030)
pH: 5.5 (ref 5.0–8.0)

## 2024-07-15 LAB — LIPASE, BLOOD: Lipase: 130 U/L — ABNORMAL HIGH (ref 11–51)

## 2024-07-15 LAB — PREGNANCY, URINE: Preg Test, Ur: NEGATIVE

## 2024-07-15 LAB — CBC
HCT: 43.9 % (ref 36.0–46.0)
Hemoglobin: 14.8 g/dL (ref 12.0–15.0)
MCH: 31.2 pg (ref 26.0–34.0)
MCHC: 33.7 g/dL (ref 30.0–36.0)
MCV: 92.6 fL (ref 80.0–100.0)
Platelets: 265 K/uL (ref 150–400)
RBC: 4.74 MIL/uL (ref 3.87–5.11)
RDW: 12.8 % (ref 11.5–15.5)
WBC: 10.6 K/uL — ABNORMAL HIGH (ref 4.0–10.5)
nRBC: 0 % (ref 0.0–0.2)

## 2024-07-15 MED ORDER — IOHEXOL 300 MG/ML  SOLN
100.0000 mL | Freq: Once | INTRAMUSCULAR | Status: AC | PRN
  Administered 2024-07-15: 100 mL via INTRAVENOUS

## 2024-07-15 MED ORDER — OXYCODONE-ACETAMINOPHEN 5-325 MG PO TABS
1.0000 | ORAL_TABLET | Freq: Once | ORAL | Status: AC
  Administered 2024-07-15: 1 via ORAL
  Filled 2024-07-15: qty 1

## 2024-07-15 MED ORDER — LORAZEPAM 1 MG PO TABS
1.0000 mg | ORAL_TABLET | Freq: Once | ORAL | Status: AC
  Administered 2024-07-15: 1 mg via ORAL
  Filled 2024-07-15: qty 1

## 2024-07-15 NOTE — ED Notes (Addendum)
 Patient transported to CT

## 2024-07-15 NOTE — ED Notes (Signed)
 ED Provider at bedside.

## 2024-07-15 NOTE — ED Provider Notes (Signed)
 [place] EMERGENCY DEPARTMENT AT [facility] Provider Note   CSN: [id number] Arrival date & time: 07/15/24  8171     Patient presents with: Abdominal Pain   Rebekah Davis is a 38 y.o. female.  Patient presents to the emergency department with concerns of abdominal pain.  Past history significant for obesity, ovarian cyst, PCOS and states that she began to experience lower abdominal pain since yesterday.  States that she seen at urgent care today and advised to come in for evaluation given concerns for hematuria and history of ovarian torsion on the right side.  She reports that she has had bilateral fallopian tubes removed.  She does report some increased pain and discomfort with defecation.  No reported fever, chills, body aches.  Denies any dysuria or gross hematuria.  No reported flank pain.   Abdominal Pain      Prior to Admission medications   Medication Sig Start Date End Date Taking? Authorizing Provider  acetaminophen  (TYLENOL ) 500 MG tablet Take 1,000 mg by mouth 3 (three) times daily as needed for mild pain (pain score 1-3) or moderate pain (pain score 4-6).    [provider]  ARIPiprazole  (ABILIFY ) 5 MG tablet Take 5 mg by mouth daily.    [provider]  ergocalciferol  (VITAMIN D2) 1.25 MG (50000 UT) capsule Take 1 capsule (50,000 Units total) by mouth once a week. 10/23/23     escitalopram  (LEXAPRO ) 10 MG tablet Take 1 tablet (10 mg total) by mouth at bedtime. Patient taking differently: Take 10 mg by mouth in the morning. 04/21/24   Motley-Mangrum, Jadeka A, PMHNP  hydrOXYzine  (ATARAX ) 25 MG tablet Take 1 tablet (25 mg total) by mouth 3 (three) times daily as needed for anxiety. 04/21/24   Motley-Mangrum, Jadeka A, PMHNP  levonorgestrel (MIRENA, 52 MG,) 20 MCG/DAY IUD 1 each by Intrauterine route once.    [provider]  LORazepam  (ATIVAN ) 1 MG tablet Take 1 mg by mouth at bedtime as needed for sleep.    [provider]   methocarbamol  (ROBAXIN ) 500 MG tablet Take 500 mg by mouth 2 (two) times daily as needed for muscle spasms. 03/08/24   [provider]  neomycin -bacitracin -polymyxin (NEOSPORIN) OINT Apply 1 Application topically as needed for wound care.    [provider]  polyethylene glycol powder (GLYCOLAX /MIRALAX ) 17 GM/SCOOP powder Take 17 g by mouth daily as needed for mild constipation or moderate constipation.    [provider]  Scar Treatment Products [facility] ADVANCED SCAR GEL EX) Apply 1 Application topically daily.    [provider]  sennosides-docusate sodium  (SENOKOT-S) 8.6-50 MG tablet Take 1 tablet by mouth as needed for constipation.    [provider]    Allergies: Adhesive [tape], Cherry, Silicone, Sulfamethoxazole-trimethoprim, Ibuprofen , Levofloxacin, Morphine, Tizanidine, Gabapentin, Metformin, Sulfa antibiotics, and Sumatriptan    Review of Systems  Gastrointestinal:  Positive for abdominal pain.  All other systems reviewed and are negative.   Updated Vital Signs BP 119/70   Pulse 95   Temp 97.8 F (36.6 C) (Skin)   Resp 20   Ht 5' 5 (1.651 m)   Wt (!) 149.2 kg   SpO2 98%   BMI 54.74 kg/m   Physical Exam Vitals and nursing note reviewed.  Constitutional:      General: She is not in acute distress.    Appearance: She is well-developed.  HENT:     Head: Normocephalic and atraumatic.  Eyes:     Conjunctiva/sclera: Conjunctivae normal.  Cardiovascular:  Rate and Rhythm: Normal rate and regular rhythm.     Heart sounds: No murmur heard. Pulmonary:     Effort: Pulmonary effort is normal. No respiratory distress.     Breath sounds: Normal breath sounds.  Abdominal:     Palpations: Abdomen is soft.     Tenderness: There is abdominal tenderness in the suprapubic area and left lower quadrant. There is no guarding or rebound.  Musculoskeletal:        General: No swelling.     Cervical back: Neck supple.  Skin:     General: Skin is warm and dry.     Capillary Refill: Capillary refill takes less than 2 seconds.  Neurological:     Mental Status: She is alert.  Psychiatric:        Mood and Affect: Mood normal.     (all labs ordered are listed, but only abnormal results are displayed) Labs Reviewed  LIPASE, BLOOD - Abnormal; Notable for the following components:      Result Value   Lipase 130 (*)    All other components within normal limits  COMPREHENSIVE METABOLIC PANEL WITH GFR - Abnormal; Notable for the following components:   Glucose, Bld 102 (*)    All other components within normal limits  CBC - Abnormal; Notable for the following components:   WBC 10.6 (*)    All other components within normal limits  URINALYSIS, ROUTINE W REFLEX MICROSCOPIC - Abnormal; Notable for the following components:   Hgb urine dipstick MODERATE (*)    Bacteria, UA RARE (*)    All other components within normal limits  PREGNANCY, URINE  TRIGLYCERIDES  LIPID PANEL    EKG: None  Radiology: CT ABDOMEN PELVIS W CONTRAST Result Date: 07/15/2024 EXAM: CT ABDOMEN AND PELVIS WITH CONTRAST 07/15/2024 09:50:51 PM TECHNIQUE: CT of the abdomen and pelvis was performed with the administration of 100 mL of iohexol  (OMNIPAQUE ) 300 MG/ML solution. Multiplanar reformatted images are provided for review. Automated exposure control, iterative reconstruction, and/or weight-based adjustment of the mA/kV was utilized to reduce the radiation dose to as low as reasonably achievable. COMPARISON: Comparison with 02/06/2024. CLINICAL HISTORY: LLQ abdominal pain. Patient reports lower abdominal pain (bladder area) since yesterday, hematuria, and pain with defecation with a feeling of pressure. FINDINGS: LOWER CHEST: No acute abnormality. LIVER: The liver is unremarkable. GALLBLADDER AND BILE DUCTS: Gallbladder is unremarkable. No biliary ductal dilatation. SPLEEN: No acute abnormality. PANCREAS: No acute abnormality. ADRENAL GLANDS: No acute  abnormality. KIDNEYS, URETERS AND BLADDER: No stones in the kidneys or ureters. No hydronephrosis. No perinephric or periureteral stranding. Urinary bladder is unremarkable. GI AND BOWEL: Stomach demonstrates no acute abnormality. There is no bowel obstruction. Normal appendix. PERITONEUM AND RETROPERITONEUM: No ascites. No free air. VASCULATURE: Aorta is normal in caliber. LYMPH NODES: No lymphadenopathy. REPRODUCTIVE ORGANS: IUD in the uterus. BONES AND SOFT TISSUES: No acute osseous abnormality. No focal soft tissue abnormality. IMPRESSION: 1. No acute findings in the abdomen or pelvis. Electronically signed by: Norman Gatlin MD 07/15/2024 09:58 PM EDT RP Workstation: [id number]     Procedures   Medications Ordered in the ED  LORazepam  (ATIVAN ) tablet 1 mg (1 mg Oral Given 07/15/24 2110)  iohexol  (OMNIPAQUE ) 300 MG/ML solution 100 mL (100 mLs Intravenous Contrast Given 07/15/24 2142)  oxyCODONE -acetaminophen  (PERCOCET/ROXICET) 5-325 MG per tablet 1 tablet (1 tablet Oral Given 07/15/24 2322)  Medical Decision Making Amount and/or Complexity of Data Reviewed Labs: ordered. Radiology: ordered.  Risk Prescription drug management.   This patient presents to the ED for concern of abdominal pain.  Differential diagnosis includes bowel obstruction, diverticulitis, cholecystitis, pancreatitis, pyelonephritis   Lab Tests:  I Ordered, and personally interpreted labs.  The pertinent results include: CBC with mild leukocytosis at 10.6, CMP unremarkable, lipase elevated at 130, UA with blood present but no bacteria seen, urine pregnancy negative.  Triglycerides pending, lipid panel pending   Imaging Studies ordered:  I ordered imaging studies including CT abdomen pelvis, US  for torsion rule out I independently visualized and interpreted imaging which showed CT negative for any acute findings I agree with the radiologist interpretation   Medicines ordered  and prescription drug management:  I ordered medication including Ativan , Percocet  for anxiety, pain  Reevaluation of the patient after these medicines showed that the patient improved I have reviewed the patients home medicines and have made adjustments as needed   Problem List / ED Course:  Patient presents emergency department concern abdominal pain.  Past history significant for obesity, ovarian cyst, PCOS here with concerns of left lower quadrant abdominal pain since yesterday.  States that she was reportedly seen in urgent care today and advised to come to the emergency department for evaluation given concerns of hematuria on point-of-care urinalysis.  She does have a history of ovarian torsion on the right side and states that she has had prior salpingectomy of bilateral sides. Exam reveals primarily suprapubic and left lower quadrant abdominal pain.  No right lower quadrant pain.  Normal bowel sounds. CBC with mild leukocytosis at 10.6, CMP unremarkable, lipase elevated 130, UA without obvious signs of infection but some bacteria seen, urine pregnancy negative.  Lipid panel triglycerides pending. CT imaging is negative.  Had discussion with patient of reassuring workup and imaging at this time.  Given history of torsion, she is requesting down on ultrasound for ovarian torsion rule out. Percocet added on for pain.  12:03 AM Care of Eithel Ryall transferred to Dr. Geroldine at the end of my shift as the patient will require reassessment once labs/imaging have resulted. Patient presentation, ED course, and plan of care discussed with review of all pertinent labs and imaging. Please see his/her note for further details regarding further ED course and disposition. Plan at time of handoff is disposition per US  results assessing for torsion. Unclear cause of pain at this time with normal labs and imaging. This may be altered or completely changed at the discretion of the oncoming team pending results of  further workup.    Social Determinants of Health:  None  Final diagnoses:  None    ED Discharge Orders     None          Cecily Legrand DELENA DEVONNA 07/16/24 0003    Geroldine Berg, MD 07/16/24 [phone]

## 2024-07-15 NOTE — ED Triage Notes (Signed)
 Pt via pov from home with lower abdominal pain (bladder area) since yesterday. She reports that she went to [facility], who sent her here for US . She shows urinalysis results that show hematuria. She states it hurts to defecate and feels like pressure. Pt a&o x 4; nad noted.

## 2024-07-15 NOTE — ED Notes (Signed)
 Patient transported to CT

## 2024-07-15 NOTE — Progress Notes (Signed)
 Subjective:   Patient Active Problem List   Diagnosis Date Noted  . Chronic hand pain 06/04/2024  . Trigger index finger of left hand 06/04/2024  . 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 SVT (supraventricular tachycardia) (CMD) 01/27/2024  . Incomplete defecation 01/24/2024  . Endometriosis 09/06/2023     Stage 3 Resection by MIGS 09/05/23   . High-tone pelvic floor dysfunction 06/03/2023  . Pain of left hip 06/03/2023  . Allodynia 06/03/2023  . Combined abdominal and pelvic pain 06/03/2023  . BMI 50.0-59.9, adult (HCC) 04/24/2020  . Irregular bleeding 04/12/2020  . Left Achilles tendinitis 03/11/2020  . Ovarian cyst 02/19/2020    Bilateral. Simple. 5cm on left, 3.7cm on right. No free fluid.  NSAIDs 04/12/20: right ov: 1.5 cm follicle. Left: 2.5 cm follicle.     . Ovarian cyst 02/19/2020    Bilateral. Simple. 5cm on left, 3.7cm on right. No free fluid.   NSAIDs  04/12/20: right ov: 1.5 cm follicle. Left: 2.5 cm follicle.   . Non-suicidal self harm as coping mechanism    (CMD) 12/25/2019  . Diarrhea 06/13/2018  . Hypomagnesemia 06/13/2018  . Transaminitis 06/13/2018  . C. difficile diarrhea 06/12/2018  . Oral thrush 06/12/2018  . Suicidal ideations 04/12/2018  . Tobacco user 04/04/2018  . Chest pain 03/28/2018  . OSA (obstructive sleep apnea) 03/28/2018    [facility] HST 05/14/2024 AHI 21.4 HST 12/20/2023 AHI 33.9   . Cellulitis of breast 03/22/2018  . Acute cystitis 03/10/2018  . GERD (gastroesophageal reflux disease) 03/10/2018  . PTSD (post-traumatic stress disorder) 03/09/2018  . Severe episode of recurrent major depressive disorder, without psychotic features    (CMD) 02/27/2018    Patient admitted with decompensation of depression   . Meralgia paresthetica, bilateral lower limbs 02/07/2017  . Myofascial pain 02/07/2017  . Tachycardia 12/18/2016  . Lumbar radiculitis 12/13/2016  . Polycystic disease, ovaries 07/05/2016     2021 -Bilateral. Simple. 5cm on left, 3.7cm on right. No free fluid.  NSAIDs Repeat imaging in 8-10wks.   . Abdominal pain, suprapubic 06/04/2016  . Pelvic pain in female 06/04/2016  . Generalized anxiety disorder 12/09/2015  . Spondylolisthesis 04/20/2015  . Herniated lumbar intervertebral disc 02/28/2013  . Sciatica of left side 02/28/2013  . Borderline personality disorder    (CMD) 12/29/2012  . Anxiety 12/24/2012  . COPD (chronic obstructive pulmonary disease) 12/24/2012  . Major depressive disorder 12/24/2012  . Essential hypertension 12/24/2012  . Multiple lacerations 12/24/2012  . Non-specific low back pain 07/22/2012     Chief Complaint  Patient presents with  . Abdominal Pain    Pt complains of lower abd x 2 days Pain level 9 .Pt also feeling nausea due to pain.      History of Present Illness  This is a 16 old female presenting with 2-day history of lower abdominal pain.  Patient rates her pain 9 out of 10.  Associated symptoms include nausea without vomiting.  She has history of right ovarian torsion with surgical removal.  Patient also states she has severe endometriosis.  History of ovarian cyst.  She has taken Tylenol  without relief.  Patient is not sexually active.  Last menstrual cycle was over a year ago.  Denies concerns for pregnancy.  Patient denies vaginal discharge or concerns for STIs.  No dysuria, urgency, frequency, retention.  Patient has her gallbladder and her appendix.  Parts of patient history reviewed include PMH, problem list, medications, allergies, and social history.  Objective:  Vitals:   07/15/24 1713  BP: 151/77  Pulse: 92  Resp: (!) 24  Temp: 98.8 F (37.1 C)  TempSrc: Oral  SpO2: 100%  Weight: (!) 149 kg (328 lb)    Physical Exam Vitals and nursing note reviewed.  Constitutional:      General: She is in acute distress.     Appearance: She is obese.  HENT:     Head: Normocephalic and atraumatic.  Cardiovascular:     Rate and  Rhythm: Normal rate.     Pulses: Normal pulses.  Pulmonary:     Effort: Pulmonary effort is normal. No respiratory distress.  Abdominal:     General: Abdomen is flat. Bowel sounds are normal.     Tenderness: There is abdominal tenderness in the suprapubic area and left lower quadrant. There is guarding. There is no right CVA tenderness or left CVA tenderness.  Musculoskeletal:     Cervical back: Neck supple.  Skin:    General: Skin is warm.     Capillary Refill: Capillary refill takes less than 2 seconds.  Neurological:     Mental Status: She is oriented to person, place, and time. Mental status is at baseline.  Psychiatric:        Mood and Affect: Mood is anxious. Affect is tearful.    Results for orders placed or performed in visit on 07/15/24  POC Urinalysis Auto without Microscopic   Collection Time: 07/15/24  5:28 PM  Result Value Ref Range   Color, Urine Yellow Yellow   Clarity, Urine Clear Clear   Glucose, Urine Negative Negative mg/dL   Bilirubin, Urine Negative Negative   Ketones, Urine Negative Negative mg/dL   Specific Gravity, Urine >=1.030 (A) 1.010, 1.015, 1.020, 1.025   Blood, Urine Moderate (A) Negative   pH, Urine 5.5 5.0, 5.5, 6.0, 6.5, 7.0, 7.5, 8.0   Protein, Urine Negative Negative mg/dL   Urobilinogen, Urine 0.2 <2.0 mg/dL   Nitrite, Urine Negative Negative   Leukocyte Esterase, Urine Negative Negative   Kit/Device Lot # [id number]    Kit/Device Expiration Date 07/14/2025   POC HCG Qualitative, Urine   Collection Time: 07/15/24  5:55 PM  Result Value Ref Range   HCG, Urine, POC Negative Negative   Internal Control Acceptable     Lab work reviewed and incorporated into the decision making process    Assessment/Plan:   Assessment & Plan  LLQ Abdominal Pain   Ddx: Ovarian torsion, ectopic pregnancy, acute cystitis, ovarian cyst, uterine fibroid yes, bowel obstruction, diverticulitis, appendicitis  Patient presents with left lower abdominal pain/  suprapubic pelvic pain. Abdominal exam significant for tenderness with palpation to the left lower quadrant and suprapubic region.  Patient is tearful and appears quite uncomfortable.  History of right ovarian torsion with surgical removal as well as endometriosis.  Patient is not sexually active.  Denies vaginal discharge.  Patient does have her appendix and gallbladder.  No localized right lower quadrant tenderness.  UA remarkable for blood otherwise negative for nitrates and leukocyte esterase.  Urine hCG is negative.  Given the degree of discomfort and obvious distress, feel patient needs emergent/higher level of care to rule out ovarian torsion.  Patient given IM injection of Toradol  prior to discharge for pain.  Offered EMS  transfer, patient declines.  She understands risk versus benefits of private transfer.  Follow up with ED   Patient agreed with plan and voiced understanding.  No barriers to adherence perceived by myself.  Portions of this note may  have been dictated using Engineer, materials and may contain grammatical or spelling errors.   Electronically signed by:   Sotero Pore, DNP ENP-C FNP-C [facility]  07/15/2024 5:58 PM

## 2024-07-15 NOTE — Progress Notes (Signed)
 Orthopaedic Surgery Hand and Upper Extremity History and Physical Examination  CC: Follow-up left index finger sprain  HPI 07/15/2024: Rebekah Davis is a 38 y.o. female presents for follow-up evaluation of left index finger sprain.  DOI March 2025.  She had an MRI on 05/21/2024 with no abnormalities.  States that she has been doing her putty exercises and she is feeling significantly better.  She is pleased with her progress.   Problem List:  Problem List[1]  Past Medical History: Medical History[2]   Medications: Current Rx ordered in [facility][3]  Allergies: Allergies as of 07/15/2024 - Reviewed 07/15/2024  Allergen Reaction Noted  . Cherry Rash 12/16/2019  . Dimethicone analogues Hives and Rash 07/22/2012  . Silicone Hives and Rash 07/22/2012  . Sulfa (sulfonamide antibiotics) Rash 04/23/2018  . Sulfamethoxazole-trimethoprim Rash 04/22/2018  . Gabapentin Rash 02/06/2017  . Levofloxacin GI Intolerance and Other (See Comments) 09/22/2011  . Metformin GI Intolerance and Other (See Comments) 07/23/2010  . Morphine Itching 07/23/2010  . Sumatriptan GI Intolerance and Other (See Comments) 07/17/2006  . Ibuprofen  Other (See Comments) 06/08/2024  . Adhesive tape-silicones Hives 07/22/2012  . Tizanidine GI Intolerance 06/14/2023    Past Surgical History: Surgical History[4]   Social History: Social History   Occupational History  . Not on file  Tobacco Use  . Smoking status: Former    Current packs/day: 0.00    Average packs/day: 1 pack/day for 12.3 years (12.3 ttl pk-yrs)    Types: Cigarettes    Start date: 2008    Quit date: 02/19/2019    Years since quitting: 5.4  . Smokeless tobacco: Never  Vaping Use  . Vaping status: Never Used  Substance and Sexual Activity  . Alcohol use: Never  . Drug use: Not Currently  . Sexual activity: Not on file     Family History: Family History[5] Otherwise, no relevant orthopaedic family history  ROS: Review of  Systems: All systems reviewed and are negative except that mentioned in HPI  Work/Sport/Hobbies: See HPI  Physical Examination: Vitals:   07/15/24 0818  BP: 133/78  Pulse: 83  Temp: 97.5 F (36.4 C)   Constitutional: Awake, alert.  WN/WD Appearance: healthy, no acute distress, well-groomed Affect: Normal HEENT: EOMI, mucous membranes moist CV: RRR Pulm: breathing comfortably  Left Upper Extremity / Hand Overlying skin is warm dry and intact.  There is no signs of rash irritation or infection.  There is no obvious deformity.  No obvious nail deformities.  Capillary refill is brisk and skin turgor is appropriate.  Neurovascularly intact.  Can make a composite fist when compared bilaterally.  Nontender to palpation over the index finger.  Full range of motion at the MCP, PIP, DIP joints of the index finger.  Sensation: intact to light touch in median, radial and ulnar nerve distributions  Motor: intact in AIN, PIN, and ulnar nerve distributions Vascular: Fingers warm and well perfused, palpable radial pulse   Assessment/Plan: 1.  Left index finger sprain: Improved and feeling significantly better - She states that she is doing significantly better and is very pleased with her progress.  She has reached MMI with a PPI of 0%.  He may return to full duty without restrictions.  She will follow-up as needed.  Betty Anton, PA-C [facility] Department of Orthopaedic Surgery 07/15/2024 8:39 AM      [1] Patient Active Problem List Diagnosis  . Ovarian cyst  . Irregular bleeding  . BMI 50.0-59.9, adult (HCC)  .  Abdominal pain, suprapubic  . Acute cystitis  . Anxiety  . C. difficile diarrhea  . Borderline personality disorder    (CMD)  . Cellulitis of breast  . Chest pain  . COPD (chronic obstructive pulmonary disease)  . Major depressive disorder  . Diarrhea  . Essential hypertension  . GERD (gastroesophageal reflux disease)  . Herniated lumbar  intervertebral disc  . Hypomagnesemia  . Left Achilles tendinitis  . Non-specific low back pain  . Lumbar radiculitis  . Meralgia paresthetica, bilateral lower limbs  . Multiple lacerations  . Myofascial pain  . Non-suicidal self harm as coping mechanism    (CMD)  . Oral thrush  . OSA (obstructive sleep apnea)  . Pelvic pain in female  . Polycystic disease, ovaries  . Generalized anxiety disorder  . PTSD (post-traumatic stress disorder)  . Sciatica of left side  . Severe episode of recurrent major depressive disorder, without psychotic features    (CMD)  . Spondylolisthesis  . Suicidal ideations  . Tachycardia  . Tobacco user  . Transaminitis  . Ovarian cyst  . High-tone pelvic floor dysfunction  . Pain of left hip  . Allodynia  . Combined abdominal and pelvic pain  . Endometriosis  . Incomplete defecation  . SVT (supraventricular tachycardia) (CMD)  . 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 Chronic hand pain  . Trigger index finger of left hand  [2] Past Medical History: Diagnosis Date  . COPD (chronic obstructive pulmonary disease)   . Degenerative disc disease, lumbar   . Dissociative identity disorder   . Eye pressure    Patient reported high eye pressure  . Migraines   . Osteoarthritis   . PCOS (polycystic ovarian syndrome)   . Sleep apnea   . Sleep apnea, obstructive   . Somatization disorder   . Tendonitis   [3] Meds Ordered in [facility]  Medication Sig Dispense Refill  . nitroglycerin (NITRO-DUR) 0.2 mg/hr patch APPLY 1 PATCH TOPICALLY TO THE SKIN EVERY DAY AS NEEDED    . traMADoL (ULTRAM) 50 mg tablet Take 1 tablet every 6-8 hours by oral route as needed for 5 days.    . acetaminophen  (TYLENOL ) 325 mg tablet Take 650 mg by mouth every 8 (eight) hours as needed.    . albuterol  HFA (PROVENTIL  HFA;VENTOLIN  HFA;PROAIR  HFA) 90 mcg/actuation inhaler Inhale 2 puffs every 4 (four) hours as needed.    . ergocalciferol  (VITAMIN D2) 1,250 mcg  (50,000 unit) capsule Take 1 capsule by mouth once a week.    . escitalopram  (LEXAPRO ) 10 mg tablet TAKE 1 TABLET BY MOUTH DAILY IN ADDITION TO 5 MG TABLET FOR TOTAL DAILY DOSE OF 15 MG    . fluticasone  propion-salmeteroL (ADVAIR DISKUS) 250-50 mcg/dose diskus inhaler INHALE 1 PUFF PO BID    . hydrOXYzine  (ATARAX ) 25 mg tablet     . hydrOXYzine  pamoate (VISTARIL ) 25 mg capsule TK 1 TO 2 CS PO Q 8 H PRF ANXIETY    . ibuprofen  (MOTRIN ) 200 mg tablet Take 400 mg by mouth as needed in the morning and 400 mg as needed in the evening. On hold 11/15 for surgery 09/05/23.    [name] levonorgestreL (MIRENA) 21 mcg/24hr (up to 8 yrs) 52 mg IUD 1 each by intrauterine route once.    . LORazepam  (ATIVAN ) 0.5 mg tablet Take 0.25-0.5 mg by mouth as needed.    . polyethylene glycol (MIRALAX ) 17 gram powd powder Take 17 g by mouth daily.  No current Epic-ordered facility-administered medications on file.  [4] Past Surgical History: Procedure Laterality Date  . ACHILLES TENDON SURGERY Left 2021   metal screw in heel  . CYSTOSCOPY N/A 09/05/2023   CYSTOSCOPY performed by Hart Izetta Paddy, MD at [facility] OR  . ELECTROCONVULSIVE THERAPY    . LUMBAR DISCECTOMY  2009   discectomy and laminectomy; Dr. Lyanne  . OTHER SURGICAL HISTORY  1995   Procedure: OTHER SURGICAL HISTORY (cyst removal from finger)  . OVARIAN CYST REMOVAL Bilateral 09/05/2023   CYSTECTOMY OVARIAN, BILATERAL, EXAM UNDER ANESTHESIA, LEFT OVARIAN CYSTECTOMY, RIGHT OOPHERECTOMY, BILATERAL SALPINGOECTOMY PLACEMENT OF IUD, EXCISION ENDOMETRIOSIS  ROBOTIC-ASSISTED LAPAROSCOPY ENDOMETRIAL BIOPSY performed by Hart Izetta Paddy, MD at [facility] OR  . TONSILLECTOMY    [5] Family History Problem Relation Name Age of Onset  . Diabetes Mother    . Hypertension Mother    . Hyperlipidemia Mother    . Arthritis Mother    . Hypertension Father    . Diabetes Father    . Arthritis Father    . Ovarian cancer Other         grandmother

## 2024-07-16 LAB — LIPID PANEL
Cholesterol: 181 mg/dL (ref 0–200)
HDL: 44 mg/dL (ref >40)
LDL Cholesterol: 114 mg/dL — ABNORMAL HIGH (ref 0–99)
Total CHOL/HDL Ratio: 4.1 ratio
Triglycerides: 114 mg/dL (ref <150)
VLDL: 23 mg/dL (ref 0–40)

## 2024-07-16 LAB — TRIGLYCERIDES: Triglycerides: 72 mg/dL (ref <150)

## 2024-07-16 MED ORDER — HYDROCODONE-ACETAMINOPHEN 5-325 MG PO TABS: 1.0000 | ORAL_TABLET | Freq: Four times a day (QID) | ORAL | 0 refills | Status: DC | PRN

## 2024-07-16 NOTE — Discharge Instructions (Signed)
 Begin taking ibuprofen  600 mg every 6 hours as needed for pain.  Begin taking hydrocodone  as prescribed as needed for pain not relieved with ibuprofen .  Rest.  Follow-up with primary doctor if symptoms are not improving in the next few days, and return to the ER if your symptoms significantly worsen or change.

## 2024-07-18 ENCOUNTER — Emergency Department (HOSPITAL_COMMUNITY)
Admission: EM | Admit: 2024-07-18 | Discharge: 2024-07-18 | Disposition: A | Discharge status: Other Acute Inpatient Hospital | Attending: Emergency Medicine | Admitting: Emergency Medicine

## 2024-07-18 ENCOUNTER — Other Ambulatory Visit: Payer: Self-pay

## 2024-07-18 DIAGNOSIS — S59911A Unspecified injury of right forearm, initial encounter: Secondary | ICD-10-CM | POA: Diagnosis present

## 2024-07-18 DIAGNOSIS — R1030 Lower abdominal pain, unspecified: Secondary | ICD-10-CM | POA: Diagnosis not present

## 2024-07-18 DIAGNOSIS — F332 Major depressive disorder, recurrent severe without psychotic features: Secondary | ICD-10-CM | POA: Diagnosis not present

## 2024-07-18 DIAGNOSIS — R748 Abnormal levels of other serum enzymes: Secondary | ICD-10-CM

## 2024-07-18 DIAGNOSIS — R110 Nausea: Secondary | ICD-10-CM | POA: Insufficient documentation

## 2024-07-18 DIAGNOSIS — I10 Essential (primary) hypertension: Secondary | ICD-10-CM | POA: Insufficient documentation

## 2024-07-18 DIAGNOSIS — S51812A Laceration without foreign body of left forearm, initial encounter: Secondary | ICD-10-CM | POA: Diagnosis not present

## 2024-07-18 DIAGNOSIS — R45851 Suicidal ideations: Secondary | ICD-10-CM | POA: Diagnosis not present

## 2024-07-18 DIAGNOSIS — S51811A Laceration without foreign body of right forearm, initial encounter: Secondary | ICD-10-CM | POA: Insufficient documentation

## 2024-07-18 DIAGNOSIS — F322 Major depressive disorder, single episode, severe without psychotic features: Secondary | ICD-10-CM

## 2024-07-18 DIAGNOSIS — X781XXA Intentional self-harm by knife, initial encounter: Secondary | ICD-10-CM | POA: Diagnosis not present

## 2024-07-18 DIAGNOSIS — Z79899 Other long term (current) drug therapy: Secondary | ICD-10-CM | POA: Diagnosis not present

## 2024-07-18 DIAGNOSIS — R319 Hematuria, unspecified: Secondary | ICD-10-CM | POA: Insufficient documentation

## 2024-07-18 DIAGNOSIS — R197 Diarrhea, unspecified: Secondary | ICD-10-CM | POA: Insufficient documentation

## 2024-07-18 LAB — RAPID URINE DRUG SCREEN, HOSP PERFORMED
Amphetamines: NOT DETECTED
Barbiturates: NOT DETECTED
Benzodiazepines: NOT DETECTED
Cocaine: NOT DETECTED
Opiates: NOT DETECTED
Tetrahydrocannabinol: NOT DETECTED

## 2024-07-18 LAB — COMPREHENSIVE METABOLIC PANEL WITH GFR
ALT: 25 U/L (ref 0–44)
AST: 22 U/L (ref 15–41)
Albumin: 3.6 g/dL (ref 3.5–5.0)
Alkaline Phosphatase: 81 U/L (ref 38–126)
Anion gap: 8 (ref 5–15)
BUN: 12 mg/dL (ref 6–20)
CO2: 24 mmol/L (ref 22–32)
Calcium: 8.7 mg/dL — ABNORMAL LOW (ref 8.9–10.3)
Chloride: 106 mmol/L (ref 98–111)
Creatinine, Ser: 0.86 mg/dL (ref 0.44–1.00)
GFR, Estimated: >60 mL/min (ref >60)
Glucose, Bld: 94 mg/dL (ref 70–99)
Potassium: 3.9 mmol/L (ref 3.5–5.1)
Sodium: 138 mmol/L (ref 135–145)
Total Bilirubin: 0.9 mg/dL (ref 0.0–1.2)
Total Protein: 6.5 g/dL (ref 6.5–8.1)

## 2024-07-18 LAB — CBC
HCT: 40.8 % (ref 36.0–46.0)
Hemoglobin: 13.8 g/dL (ref 12.0–15.0)
MCH: 31.5 pg (ref 26.0–34.0)
MCHC: 33.8 g/dL (ref 30.0–36.0)
MCV: 93.2 fL (ref 80.0–100.0)
Platelets: 255 K/uL (ref 150–400)
RBC: 4.38 MIL/uL (ref 3.87–5.11)
RDW: 12.6 % (ref 11.5–15.5)
WBC: 8.1 K/uL (ref 4.0–10.5)
nRBC: 0 % (ref 0.0–0.2)

## 2024-07-18 LAB — URINALYSIS, ROUTINE W REFLEX MICROSCOPIC
Bilirubin Urine: NEGATIVE
Glucose, UA: NEGATIVE mg/dL
Ketones, ur: NEGATIVE mg/dL
Leukocytes,Ua: NEGATIVE
Nitrite: NEGATIVE
Protein, ur: NEGATIVE mg/dL
Specific Gravity, Urine: 1.01 (ref 1.005–1.030)
pH: 7 (ref 5.0–8.0)

## 2024-07-18 LAB — LIPASE, BLOOD: Lipase: 430 U/L — ABNORMAL HIGH (ref 11–51)

## 2024-07-18 LAB — ETHANOL: Alcohol, Ethyl (B): <15 mg/dL (ref <15)

## 2024-07-18 LAB — HCG, SERUM, QUALITATIVE: Preg, Serum: NEGATIVE

## 2024-07-18 MED ORDER — ONDANSETRON HCL 4 MG/2ML IJ SOLN: 4.0000 mg | Freq: Once | INTRAMUSCULAR | Status: DC

## 2024-07-18 MED ORDER — ONDANSETRON 4 MG PO TBDP
8.0000 mg | ORAL_TABLET | Freq: Once | ORAL | Status: AC
  Administered 2024-07-18: 8 mg via ORAL
  Filled 2024-07-18: qty 2

## 2024-07-18 MED ORDER — HYDROXYZINE HCL 25 MG PO TABS
25.0000 mg | ORAL_TABLET | Freq: Three times a day (TID) | ORAL | Status: DC | PRN
  Administered 2024-07-18: 25 mg via ORAL
  Filled 2024-07-18: qty 1

## 2024-07-18 MED ORDER — OXYCODONE HCL 5 MG PO TABS: 5.0000 mg | ORAL_TABLET | ORAL | 0 refills | Status: DC | PRN

## 2024-07-18 MED ORDER — ESCITALOPRAM OXALATE 10 MG PO TABS: 10.0000 mg | ORAL_TABLET | Freq: Every morning | ORAL | Status: DC

## 2024-07-18 MED ORDER — SODIUM CHLORIDE 0.9 % IV BOLUS: 1000.0000 mL | Freq: Once | INTRAVENOUS | Status: DC

## 2024-07-18 MED ORDER — CEPHALEXIN 250 MG PO CAPS: 500.0000 mg | ORAL_CAPSULE | Freq: Once | ORAL | Status: DC

## 2024-07-18 MED ORDER — ONDANSETRON 4 MG PO TBDP: 4.0000 mg | ORAL_TABLET | Freq: Three times a day (TID) | ORAL | 0 refills | Status: DC | PRN

## 2024-07-18 MED ORDER — ARIPIPRAZOLE 10 MG PO TABS: 5.0000 mg | ORAL_TABLET | Freq: Every day | ORAL | Status: DC

## 2024-07-18 MED ORDER — CEPHALEXIN 250 MG PO CAPS
500.0000 mg | ORAL_CAPSULE | Freq: Four times a day (QID) | ORAL | Status: DC
  Administered 2024-07-18: 500 mg via ORAL
  Filled 2024-07-18: qty 2

## 2024-07-18 NOTE — ED Notes (Signed)
 Report to [place], Charity fundraiser at [facility].
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
## 2024-07-18 NOTE — Progress Notes (Signed)
 Patient has been denied by [facility] due to no appropriate beds. Patient meets [facility] inpatient criteria per Elveria Batter, NP. Patient has been faxed out to the following facilities:   [facility]  [address] [place] [address] [phone]  [facility]  [address] [place] [address] [phone]  [facility]  [address] [place], [place] [address] [phone]  [facility] Patient Placement  [facility], [place] [phone]  [facility]  [address] [place] [address] [phone]  [facility]  [address], [place] [address] [phone]  [facility]  [address] [place] [address] [phone]  [facility] EFAX  [address] [place], [place] [phone]  [facility]  [address] [place] [address] [phone]  [facility]  [address], [place] [address] [phone]  [facility]  [address] [place] [address] [phone]  [facility]  [address] [place], [place] [address] [phone]  [facility]  [address] [place] [address] [phone]  [facility]  [address] [place] [address] [phone]  [facility]  [address] [place] [address] [phone]  [facility]  [address] [place] [address] [phone]   Bunnie Gallop, MSW, LCSW-A  2:30 PM 07/18/2024

## 2024-07-18 NOTE — Discharge Instructions (Signed)
 You were seen for your psychiatric concerns and abdominal pain in the emergency department.  You may have mild pancreatitis.  At home, please take Tylenol  and the oxycodone  we have prescribed you for your abdominal pain.  Take the Zofran  for any nausea or vomiting.  Follow the eating plan listed in the packet.    Check your MyChart online for the results of any tests that had not resulted by the time you left the emergency department.   Follow-up with your primary doctor in 2-3 days regarding your visit.    Return immediately to the emergency department if you experience any of the following: Worsening pain, vomiting despite the medication, or any other concerning symptoms.    Thank you for visiting our Emergency Department. It was a pleasure taking care of you today.

## 2024-07-18 NOTE — ED Notes (Signed)
 Attempted to call report to [facility], no answer at this time.

## 2024-07-18 NOTE — Progress Notes (Signed)
 [facility] LCSW Progress Note   07/18/2024    3:15 PM  Rebekah Davis   [id number]   Type of Contact and Topic:  Psychiatric Bed Placement   Pt accepted to [facility] Unit     Patient meets inpatient criteria per Elveria Batter, NP  The attending provider will be Dr. Sherlean Jabs  Call report to [phone]  Delon Side, RN @ [facility] ED notified.     Pt scheduled  to arrive at [facility] for TODAY.    Florencia Zaccaro, MSW, LCSW-A  3:17 PM 07/18/2024

## 2024-07-18 NOTE — ED Provider Notes (Signed)
 Reevaluated patient prior to transport.  She does have a mildly elevated lipase but had a CT scan several days ago that did not show any acute findings.  Minimal epigastric abdominal tenderness palpation.  Says that she does not have any significant vomiting at this point in time.  Will treat her for mild pancreatitis and sent pain and nausea medications to the pharmacy.  Safe transport has arrived to take her to the psychiatric facility.   Yolande Lamar BROCKS, MD 07/18/24 [phone]

## 2024-07-18 NOTE — ED Triage Notes (Signed)
 Pt bib Police voluntarily. Pt texted suicidal ideations in her prayer group so they called PD. Pt dose endorse SI, with plan to cut herself. Pt has superficial cut to her bilateral wrist.   Pt also c.o hematuria that she noticed this morning, states she was recently seen in another ED she had an elevated lipase.  Denies pain.

## 2024-07-18 NOTE — ED Provider Notes (Signed)
 [place] EMERGENCY DEPARTMENT AT [facility] Provider Note   CSN: [id number] Arrival date & time: 07/18/24  1139     Patient presents with: Suicidal and Hematuria  HPI Rebekah Davis is a 38 y.o. female with PCOS, hypertension, PTSD, MDD presenting for suicidal ideation and hematuria.  She was brought in voluntarily by police.  Contacted by her peer group after some texts that were suggestive of suicidal thoughts.  She states she has been cutting her forearms in the last couple days for the intent of causing self-harm and has considered using a butcher knife in her life.  Denies HI or hallucinations.  Also endorses hematuria that she noticed this morning along with persistent of suprapubic pain.  She states she had the similar pain at [facility] a few days ago where she underwent a CT scan that was reassuring.  She reports that her lipase was a little bit elevated at that time.  Endorses some nausea and diarrhea but no vomiting today.    Hematuria       Prior to Admission medications   Medication Sig Start Date End Date Taking? Authorizing Provider  acetaminophen  (TYLENOL ) 500 MG tablet Take 1,000 mg by mouth 3 (three) times daily as needed for mild pain (pain score 1-3) or moderate pain (pain score 4-6).    [provider]  ARIPiprazole  (ABILIFY ) 5 MG tablet Take 5 mg by mouth daily.    [provider]  ergocalciferol  (VITAMIN D2) 1.25 MG (50000 UT) capsule Take 1 capsule (50,000 Units total) by mouth once a week. 10/23/23     escitalopram  (LEXAPRO ) 10 MG tablet Take 1 tablet (10 mg total) by mouth at bedtime. Patient taking differently: Take 10 mg by mouth in the morning. 04/21/24   Motley-Mangrum, Jadeka A, PMHNP  HYDROcodone -acetaminophen  (NORCO/VICODIN) 5-325 MG tablet Take 1-2 tablets by mouth every 6 (six) hours as needed. 07/16/24   Geroldine Berg, MD  hydrOXYzine  (ATARAX ) 25 MG tablet Take 1 tablet (25 mg total) by mouth 3 (three) times daily as needed  for anxiety. 04/21/24   Motley-Mangrum, Jadeka A, PMHNP  levonorgestrel (MIRENA, 52 MG,) 20 MCG/DAY IUD 1 each by Intrauterine route once.    [provider]  LORazepam  (ATIVAN ) 1 MG tablet Take 1 mg by mouth at bedtime as needed for sleep.    [provider]  methocarbamol  (ROBAXIN ) 500 MG tablet Take 500 mg by mouth 2 (two) times daily as needed for muscle spasms. 03/08/24   [provider]  neomycin -bacitracin -polymyxin (NEOSPORIN) OINT Apply 1 Application topically as needed for wound care.    [provider]  polyethylene glycol powder (GLYCOLAX /MIRALAX ) 17 GM/SCOOP powder Take 17 g by mouth daily as needed for mild constipation or moderate constipation.    [provider]  Scar Treatment Products [facility] ADVANCED SCAR GEL EX) Apply 1 Application topically daily.    [provider]  sennosides-docusate sodium  (SENOKOT-S) 8.6-50 MG tablet Take 1 tablet by mouth as needed for constipation.    [provider]    Allergies: Adhesive [tape], Cherry, Silicone, Sulfamethoxazole-trimethoprim, Ibuprofen , Levofloxacin, Morphine, Tizanidine, Gabapentin, Metformin, Sulfa antibiotics, and Sumatriptan    Review of Systems  Genitourinary:  Positive for hematuria.    Updated Vital Signs BP (!) 163/95   Pulse 86   Temp 98.3 F (36.8 C)   Resp 18   Ht 5' 5.5 (1.664 m)   Wt (!) 154.7 kg   SpO2 100%   BMI 55.88 kg/m   Physical Exam Vitals  and nursing note reviewed.  HENT:     Head: Normocephalic and atraumatic.     Mouth/Throat:     Mouth: Mucous membranes are moist.  Eyes:     General:        Right eye: No discharge.        Left eye: No discharge.     Conjunctiva/sclera: Conjunctivae normal.  Cardiovascular:     Rate and Rhythm: Normal rate and regular rhythm.     Pulses: Normal pulses.     Heart sounds: Normal heart sounds.  Pulmonary:     Effort: Pulmonary effort is normal.     Breath sounds: Normal breath sounds.   Abdominal:     General: Abdomen is flat. There is no distension.     Palpations: Abdomen is soft.     Tenderness: There is no abdominal tenderness.  Skin:    General: Skin is warm and dry.     Comments: Multiple superficial cut marks on both forearms  Neurological:     General: No focal deficit present.  Psychiatric:        Mood and Affect: Mood normal.     (all labs ordered are listed, but only abnormal results are displayed) Labs Reviewed  COMPREHENSIVE METABOLIC PANEL WITH GFR - Abnormal; Notable for the following components:      Result Value   Calcium 8.7 (*)    All other components within normal limits  URINALYSIS, ROUTINE W REFLEX MICROSCOPIC - Abnormal; Notable for the following components:   Hgb urine dipstick MODERATE (*)    Bacteria, UA RARE (*)    All other components within normal limits  URINE CULTURE  ETHANOL  CBC  RAPID URINE DRUG SCREEN, HOSP PERFORMED  HCG, SERUM, QUALITATIVE  LIPASE, BLOOD    EKG: None  Radiology: No results found.   Procedures   Medications Ordered in the ED  sodium chloride  0.9 % bolus 1,000 mL (has no administration in time range)  ondansetron  (ZOFRAN ) injection 4 mg (has no administration in time range)  cephALEXin  (KEFLEX ) capsule 500 mg (has no administration in time range)  ARIPiprazole  (ABILIFY ) tablet 5 mg (has no administration in time range)  escitalopram  (LEXAPRO ) tablet 10 mg (has no administration in time range)  hydrOXYzine  (ATARAX ) tablet 25 mg (has no administration in time range)                                    Medical Decision Making Amount and/or Complexity of Data Reviewed Labs: ordered.  Risk Prescription drug management.   38 year old well-appearing female presenting for suicidal ideation and hematuria.  Exam was unremarkable but did show cutting marks in her forearms.  Also reviewed her CT scan from 4 days ago when she had similar symptoms which revealed no acute findings in the abdomen or  pelvis.  Labs were mostly unremarkable.  There was some blood along with small amount of bacteria in the urine.  Given her persistence of her suprapubic pain along with slightly concerning UA, went ahead and started her on Keflex  for the UTI.  Also clean the superficial cutting abrasions on her forearms and cover them with a bandage.  Medically cleared at this time.  Placed psych hold awaiting TTS evaluation.       Final diagnoses:  Suicidal ideation  Hematuria, unspecified type    ED Discharge Orders     None  Lang Norleen POUR, PA-C 07/18/24 1527    Cottie Donnice PARAS, MD 07/18/24 1539
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
## 2024-07-18 NOTE — ED Notes (Signed)
 Pt wanded by security.

## 2024-07-20 LAB — URINE CULTURE: Culture: 60000 — AB

## 2024-08-07 ENCOUNTER — Encounter (INDEPENDENT_AMBULATORY_CARE_PROVIDER_SITE_OTHER): Payer: Self-pay

## 2024-08-31 ENCOUNTER — Ambulatory Visit: Payer: Self-pay | Attending: Nurse Practitioner
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
## 2024-09-01 ENCOUNTER — Telehealth: Payer: Self-pay

## 2024-09-01 NOTE — Telephone Encounter (Signed)
 Called and spoke with patient after no-show appointment yesterday. She is still in hospital and will not be discharged until Thursday. She states that she intends on being here for visits next week, but will call Friday if something changes.

## 2024-09-03 ENCOUNTER — Encounter

## 2024-09-07 ENCOUNTER — Encounter

## 2024-09-09 ENCOUNTER — Encounter

## 2024-09-24 ENCOUNTER — Ambulatory Visit: Payer: Self-pay | Attending: Nurse Practitioner

## 2024-09-24 DIAGNOSIS — M62838 Other muscle spasm: Secondary | ICD-10-CM | POA: Diagnosis present

## 2024-09-24 DIAGNOSIS — R279 Unspecified lack of coordination: Secondary | ICD-10-CM | POA: Diagnosis present

## 2024-09-24 DIAGNOSIS — M6281 Muscle weakness (generalized): Secondary | ICD-10-CM

## 2024-09-24 DIAGNOSIS — R293 Abnormal posture: Secondary | ICD-10-CM | POA: Diagnosis present

## 2024-09-24 DIAGNOSIS — R102 Pelvic and perineal pain unspecified side: Secondary | ICD-10-CM

## 2024-09-24 DIAGNOSIS — M5459 Other low back pain: Secondary | ICD-10-CM | POA: Diagnosis present
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
## 2024-09-28 ENCOUNTER — Ambulatory Visit

## 2024-09-28 DIAGNOSIS — M6281 Muscle weakness (generalized): Secondary | ICD-10-CM

## 2024-09-28 DIAGNOSIS — M62838 Other muscle spasm: Secondary | ICD-10-CM

## 2024-09-28 DIAGNOSIS — R279 Unspecified lack of coordination: Secondary | ICD-10-CM

## 2024-09-28 DIAGNOSIS — R293 Abnormal posture: Secondary | ICD-10-CM

## 2024-09-28 DIAGNOSIS — M5459 Other low back pain: Secondary | ICD-10-CM

## 2024-09-28 DIAGNOSIS — R102 Pelvic and perineal pain unspecified side: Secondary | ICD-10-CM
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
## 2024-09-30 ENCOUNTER — Ambulatory Visit

## 2024-09-30 DIAGNOSIS — R293 Abnormal posture: Secondary | ICD-10-CM | POA: Diagnosis not present

## 2024-09-30 DIAGNOSIS — M5459 Other low back pain: Secondary | ICD-10-CM

## 2024-09-30 DIAGNOSIS — R279 Unspecified lack of coordination: Secondary | ICD-10-CM

## 2024-09-30 DIAGNOSIS — R102 Pelvic and perineal pain unspecified side: Secondary | ICD-10-CM

## 2024-09-30 DIAGNOSIS — M6281 Muscle weakness (generalized): Secondary | ICD-10-CM

## 2024-09-30 DIAGNOSIS — M62838 Other muscle spasm: Secondary | ICD-10-CM
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
## 2024-10-05 ENCOUNTER — Ambulatory Visit: Payer: Self-pay

## 2024-10-05 DIAGNOSIS — R102 Pelvic and perineal pain unspecified side: Secondary | ICD-10-CM

## 2024-10-05 DIAGNOSIS — M6281 Muscle weakness (generalized): Secondary | ICD-10-CM

## 2024-10-05 DIAGNOSIS — R279 Unspecified lack of coordination: Secondary | ICD-10-CM

## 2024-10-05 DIAGNOSIS — M62838 Other muscle spasm: Secondary | ICD-10-CM

## 2024-10-05 DIAGNOSIS — M5459 Other low back pain: Secondary | ICD-10-CM

## 2024-10-05 DIAGNOSIS — R293 Abnormal posture: Secondary | ICD-10-CM
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
## 2024-10-06 ENCOUNTER — Ambulatory Visit: Admitting: Physical Therapy

## 2024-10-06 DIAGNOSIS — R279 Unspecified lack of coordination: Secondary | ICD-10-CM

## 2024-10-06 DIAGNOSIS — M62838 Other muscle spasm: Secondary | ICD-10-CM

## 2024-10-06 DIAGNOSIS — R293 Abnormal posture: Secondary | ICD-10-CM | POA: Diagnosis not present

## 2024-10-06 DIAGNOSIS — M6281 Muscle weakness (generalized): Secondary | ICD-10-CM

## 2024-10-06 NOTE — Patient Instructions (Signed)
 Toileting Mechanics 101: Urination  Positioning: sit all the way down on the toilet, feet flat on the floor, trunk relaxed Hovering over the toilet seat can impact your ability to relax the pelvic floor musculature and empty your bladder well  When you initiate urination, try to let the urine flow out naturally rather than pushing down. Pushing can limit the ability of your urethral sphincter to relax and open. Double voiding technique: When you think you are done urinating, try gently pushing to see if there is any remaining urine that can be expelled   You can try the following techniques to see if there is any remaining urine that can be expelled: Rocking back and forth Leaning side to side  Twisting your trunk  Taking deep belly breaths to promote blood flow to pelvic floor musculature  Defecation  Positioning: sit all the way down on the toilet, feet flat on the floor, trunk relaxed You can try using a squatty potty or toilet stool under your feet to change the angle of your rectum in your pelvic floor, making it easier to pass stool with less straining  Avoid straining or breath holding while on the toilet. This causes increased intra-abdominal pressure, which causes increased pressure down through the pelvic floor. We want to avoid this if possible. If you do feel the need to push to pass bowel movements, practice the following technique instead: Imagine you are fogging up a mirror with your breath as you exhale and gently push down and out through your pelvic floor. This relieves some pressure while you're still able to push the stool out.  You can try the following techniques to see if there is any remaining stool that can be expelled: Rocking back and forth Leaning side to side  Twisting your trunk  Taking deep belly breaths to promote blood flow to pelvic floor musculature
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
## 2024-10-13 ENCOUNTER — Ambulatory Visit

## 2024-10-13 DIAGNOSIS — M5459 Other low back pain: Secondary | ICD-10-CM

## 2024-10-13 DIAGNOSIS — R279 Unspecified lack of coordination: Secondary | ICD-10-CM

## 2024-10-13 DIAGNOSIS — M62838 Other muscle spasm: Secondary | ICD-10-CM

## 2024-10-13 DIAGNOSIS — R102 Pelvic and perineal pain unspecified side: Secondary | ICD-10-CM

## 2024-10-13 DIAGNOSIS — R293 Abnormal posture: Secondary | ICD-10-CM

## 2024-10-13 DIAGNOSIS — M6281 Muscle weakness (generalized): Secondary | ICD-10-CM
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
## 2024-10-21 ENCOUNTER — Ambulatory Visit: Attending: Nurse Practitioner

## 2024-10-21 DIAGNOSIS — M62838 Other muscle spasm: Secondary | ICD-10-CM | POA: Insufficient documentation

## 2024-10-21 DIAGNOSIS — M6281 Muscle weakness (generalized): Secondary | ICD-10-CM | POA: Insufficient documentation

## 2024-10-21 DIAGNOSIS — R102 Pelvic and perineal pain unspecified side: Secondary | ICD-10-CM | POA: Insufficient documentation

## 2024-10-21 DIAGNOSIS — R279 Unspecified lack of coordination: Secondary | ICD-10-CM | POA: Insufficient documentation

## 2024-10-21 DIAGNOSIS — M5459 Other low back pain: Secondary | ICD-10-CM | POA: Diagnosis present

## 2024-10-21 DIAGNOSIS — R293 Abnormal posture: Secondary | ICD-10-CM | POA: Diagnosis present
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
## 2024-10-28 ENCOUNTER — Ambulatory Visit

## 2024-10-28 DIAGNOSIS — R279 Unspecified lack of coordination: Secondary | ICD-10-CM

## 2024-10-28 DIAGNOSIS — R102 Pelvic and perineal pain unspecified side: Secondary | ICD-10-CM

## 2024-10-28 DIAGNOSIS — M6281 Muscle weakness (generalized): Secondary | ICD-10-CM

## 2024-10-28 DIAGNOSIS — R293 Abnormal posture: Secondary | ICD-10-CM | POA: Diagnosis not present

## 2024-10-28 DIAGNOSIS — M62838 Other muscle spasm: Secondary | ICD-10-CM

## 2024-10-28 DIAGNOSIS — M5459 Other low back pain: Secondary | ICD-10-CM
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
## 2024-10-30 ENCOUNTER — Other Ambulatory Visit: Payer: Self-pay

## 2024-10-30 ENCOUNTER — Encounter (HOSPITAL_BASED_OUTPATIENT_CLINIC_OR_DEPARTMENT_OTHER): Payer: Self-pay

## 2024-10-30 ENCOUNTER — Emergency Department (HOSPITAL_BASED_OUTPATIENT_CLINIC_OR_DEPARTMENT_OTHER)
Admission: EM | Admit: 2024-10-30 | Discharge: 2024-10-30 | Disposition: A | Discharge status: Home / Self Care | Attending: Emergency Medicine | Admitting: Emergency Medicine

## 2024-10-30 DIAGNOSIS — M549 Dorsalgia, unspecified: Secondary | ICD-10-CM | POA: Insufficient documentation

## 2024-10-30 DIAGNOSIS — R200 Anesthesia of skin: Secondary | ICD-10-CM | POA: Diagnosis not present

## 2024-10-30 MED ORDER — ACETAMINOPHEN 500 MG PO TABS: 1000.0000 mg | ORAL_TABLET | Freq: Three times a day (TID) | ORAL | 0 refills | Status: AC | PRN

## 2024-10-30 MED ORDER — KETOROLAC TROMETHAMINE 15 MG/ML IJ SOLN
30.0000 mg | Freq: Once | INTRAMUSCULAR | Status: AC
  Administered 2024-10-30: 30 mg via INTRAMUSCULAR
  Filled 2024-10-30: qty 2

## 2024-10-30 MED ORDER — PREDNISONE 10 MG (21) PO TBPK: ORAL_TABLET | Freq: Every day | ORAL | 0 refills | Status: AC

## 2024-10-30 MED ORDER — CYCLOBENZAPRINE HCL 10 MG PO TABS: 10.0000 mg | ORAL_TABLET | Freq: Two times a day (BID) | ORAL | 0 refills | Status: AC | PRN

## 2024-10-30 MED ORDER — CELECOXIB 200 MG PO CAPS: 200.0000 mg | ORAL_CAPSULE | Freq: Two times a day (BID) | ORAL | 0 refills | Status: AC

## 2024-10-30 MED ORDER — LIDOCAINE 5 % EX PTCH
1.0000 | MEDICATED_PATCH | CUTANEOUS | Status: DC
  Administered 2024-10-30: 1 via TRANSDERMAL
  Filled 2024-10-30: qty 1

## 2024-10-30 NOTE — ED Provider Notes (Signed)
 " [place] [facility] AT [facility] Provider Note   CSN: [id number] Arrival date & time: 10/30/24  0303     History Chief Complaint  Patient presents with   Back Pain    HPI Ellice Boultinghouse is a 39 y.o. female presenting for chief plaint of back pain after trying to help somebody move a few days ago.  States that she immediately felt that she had overdone it and so yesterday she schedule appoint with her physical therapist.  She completed therapy and felt much worse afterwards.  Barely able to ambulate in and out of car.  States a history of back surgery secondary to herniated disc and states that she is just not remembering that it is very similar to what she is feeling right the second.  Endorse some leg numbness but states this is acute on chronic. Notably in the last 3 months, patient has been to [facility], [place], [facility], [facility], and [facility] all for a myriad of complaints.  Ranges from chronic pelvic pain to chronic headache to chronic flank pain. Tonight she presents as above. Patient's recorded medical, surgical, social, medication list and allergies were reviewed in the Snapshot window as part of the initial history.   Review of Systems   Review of Systems  Constitutional:  Negative for chills and fever.  HENT:  Negative for ear pain and sore throat.   Eyes:  Negative for pain and visual disturbance.  Respiratory:  Negative for cough and shortness of breath.   Cardiovascular:  Negative for chest pain and palpitations.  Gastrointestinal:  Negative for abdominal pain and vomiting.  Genitourinary:  Negative for dysuria and hematuria.  Musculoskeletal:  Positive for back pain. Negative for arthralgias.  Skin:  Negative for color change and rash.  Neurological:  Negative for seizures and syncope.  All other systems reviewed and are negative.   Physical Exam Updated Vital Signs BP (!) 160/92 (BP Location: Left Arm)   Pulse (!) 106    Temp (!) 97.4 F (36.3 C) (Temporal)   Resp 20   Ht 5' 5.5 (1.664 m)   Wt (!) 153.3 kg   SpO2 99%   BMI 55.39 kg/m  Physical Exam Vitals and nursing note reviewed.  Constitutional:      General: She is not in acute distress.    Appearance: She is well-developed.  HENT:     Head: Normocephalic and atraumatic.  Eyes:     Conjunctiva/sclera: Conjunctivae normal.  Cardiovascular:     Rate and Rhythm: Normal rate and regular rhythm.     Heart sounds: No murmur heard. Pulmonary:     Effort: Pulmonary effort is normal. No respiratory distress.     Breath sounds: Normal breath sounds.  Abdominal:     Palpations: Abdomen is soft.     Tenderness: There is no abdominal tenderness.  Musculoskeletal:        General: No swelling.     Cervical back: Neck supple.  Skin:    General: Skin is warm and dry.     Capillary Refill: Capillary refill takes less than 2 seconds.  Neurological:     Mental Status: She is alert.  Psychiatric:        Mood and Affect: Mood normal.      ED Course/ Medical Decision Making/ A&P     Procedures Procedures   Medications Ordered in ED Medications  lidocaine  (LIDODERM ) 5 % 1 patch (1 patch Transdermal Patch Applied 10/30/24 0343)  ketorolac  (TORADOL ) 15 MG/ML injection 30 mg (30 mg Intramuscular Given 10/30/24 0342)   Medical Decision Making:   Marcelene Weidemann is a 39 y.o. female who presented to the ED today with acute lower back pain over the past 48 hours, detailed above.    Patient placed on continuous vitals and telemetry monitoring while in ED which was reviewed periodically.   On my initial exam, the pt was with an intact neurologic exam, tolerating ambulation with an antalgic gait and p.o. intake without difficulty.  Patient had no abnormal DTRs, no midline spinal tenderness.  Patient endorsing complete sensation of the perineum.  Patient without episodes of fecal or urinary incontinence.  Patient has no focal neurologic deficits and  reassuring vital signs at this time.  No obvious physical abnormality or injury on exam. Notably, patient denies recent trauma, is afebrile, and denies IVDU.  No Concern for preganncy  Reviewed and confirmed nursing documentation for past medical history, family history, social history.    Initial Assessment:   With the patient's presentation of acute back pain in the above setting, most likely diagnosis is musculoskeletal strain. Other diagnoses were considered including (but not limited to) underlying fracture, epidural hematoma, cauda equina syndrome, spinal stenosis, spinal malignancy. These are considered less likely due to history of present illness and physical exam findings.   In particular, lack of fever, substantial history of IV drug use, or substantial neurologic abnormality is less consistent with epidural abscess versus discitis or other spinal infection. In particular,  Initial Plan:  Multimodal pain control described and patient informed on safe usage.  Screening evaluation benign at this time Neurologic exam benign.  Notably, treatment options are limited while here in the emergency room because patient does not have a ride home.  Asked if patient could get safe transport home and patient stated no.  Limited to nonsedating medications. Patient stable for continued outpatient evaluation and management of their musculoskeletal pains.  Patient referred back to primary care provider for continued evaluation and management.   Disposition:   Based on the above findings, I believe patient is stable for discharge.    Patient and family educated about specific return precautions for given chief complaint and symptoms.  Patient and family educated about follow-up with PCP and  Spine center.  Patient and family expressed understanding of return precautions and need for follow-up. Patient spoken to regarding all imaging and laboratory results and appropriate follow up for these results. All  education provided in verbal and written form and time was allowed for answering of patient questions. Patient discharged.          Emergency Department Medication Summary:   Medications  lidocaine  (LIDODERM ) 5 % 1 patch (1 patch Transdermal Patch Applied 10/30/24 0343)  ketorolac  (TORADOL ) 15 MG/ML injection 30 mg (30 mg Intramuscular Given 10/30/24 0342)      Clinical Impression:  1. Acute midline back pain, unspecified back location      Data Unavailable   Final Clinical Impression(s) / ED Diagnoses Final diagnoses:  Acute midline back pain, unspecified back location    Rx / DC Orders ED Discharge Orders          Ordered    cyclobenzaprine  (FLEXERIL ) 10 MG tablet  2 times daily PRN        10/30/24 0340    celecoxib  (CELEBREX ) 200 MG capsule  2 times daily        10/30/24 0340    acetaminophen  (TYLENOL ) 500 MG tablet  Every  8 hours PRN        10/30/24 0340    predniSONE  (STERAPRED UNI-PAK 21 TAB) 10 MG (21) TBPK tablet  Daily        10/30/24 0340              Jerral Meth, MD 10/30/24 0345  "

## 2024-10-30 NOTE — ED Triage Notes (Signed)
 Pt reports pmh of spondylosis, helped a friend move 2 days ago, lifting about 50 lb objects. Sts has attempted taking robaxin  and tylenol  at home without relief. Denies loss of bowel/bladder function. Sts pre-existing numbness to LLE has increased.

## 2024-11-04 ENCOUNTER — Ambulatory Visit

## 2024-11-11 ENCOUNTER — Ambulatory Visit

## 2024-11-19 ENCOUNTER — Ambulatory Visit: Attending: Nurse Practitioner

## 2024-11-26 ENCOUNTER — Ambulatory Visit

## 2024-12-02 ENCOUNTER — Ambulatory Visit

## 2024-12-09 ENCOUNTER — Ambulatory Visit

## 2024-12-16 ENCOUNTER — Ambulatory Visit: Attending: Nurse Practitioner
